# Patient Record
Sex: Female | Born: 1970 | Race: White | Hispanic: No | Smoking: Current every day smoker
Health system: Southern US, Community
[De-identification: ages and names within clinical notes are randomized; demographics above are authoritative.]

## PROBLEM LIST (undated history)

## (undated) DIAGNOSIS — K219 Gastro-esophageal reflux disease without esophagitis: Secondary | ICD-10-CM

## (undated) DIAGNOSIS — I10 Essential (primary) hypertension: Secondary | ICD-10-CM

## (undated) DIAGNOSIS — Z9114 Patient's other noncompliance with medication regimen: Secondary | ICD-10-CM

## (undated) DIAGNOSIS — K259 Gastric ulcer, unspecified as acute or chronic, without hemorrhage or perforation: Secondary | ICD-10-CM

## (undated) DIAGNOSIS — E876 Hypokalemia: Secondary | ICD-10-CM

## (undated) DIAGNOSIS — K056 Periodontal disease, unspecified: Secondary | ICD-10-CM

## (undated) DIAGNOSIS — R51 Headache: Secondary | ICD-10-CM

## (undated) DIAGNOSIS — F419 Anxiety disorder, unspecified: Secondary | ICD-10-CM

## (undated) DIAGNOSIS — M797 Fibromyalgia: Secondary | ICD-10-CM

## (undated) DIAGNOSIS — F329 Major depressive disorder, single episode, unspecified: Secondary | ICD-10-CM

## (undated) DIAGNOSIS — F32A Depression, unspecified: Secondary | ICD-10-CM

## (undated) DIAGNOSIS — D72828 Other elevated white blood cell count: Secondary | ICD-10-CM

## (undated) DIAGNOSIS — I639 Cerebral infarction, unspecified: Secondary | ICD-10-CM

## (undated) DIAGNOSIS — R519 Headache, unspecified: Secondary | ICD-10-CM

## (undated) DIAGNOSIS — D729 Disorder of white blood cells, unspecified: Secondary | ICD-10-CM

## (undated) DIAGNOSIS — R635 Abnormal weight gain: Secondary | ICD-10-CM

## (undated) DIAGNOSIS — G8929 Other chronic pain: Secondary | ICD-10-CM

## (undated) DIAGNOSIS — L680 Hirsutism: Secondary | ICD-10-CM

## (undated) DIAGNOSIS — M549 Dorsalgia, unspecified: Secondary | ICD-10-CM

## (undated) DIAGNOSIS — E559 Vitamin D deficiency, unspecified: Secondary | ICD-10-CM

## (undated) HISTORY — DX: Hypokalemia: E87.6

## (undated) HISTORY — DX: Dorsalgia, unspecified: M54.9

## (undated) HISTORY — DX: Other elevated white blood cell count: D72.828

## (undated) HISTORY — DX: Other chronic pain: G89.29

## (undated) HISTORY — DX: Vitamin D deficiency, unspecified: E55.9

## (undated) HISTORY — DX: Hirsutism: L68.0

## (undated) HISTORY — DX: Anxiety disorder, unspecified: F41.9

## (undated) HISTORY — DX: Fibromyalgia: M79.7

## (undated) HISTORY — DX: Periodontal disease, unspecified: K05.6

## (undated) HISTORY — DX: Patient's other noncompliance with medication regimen: Z91.14

## (undated) HISTORY — DX: Disorder of white blood cells, unspecified: D72.9

## (undated) HISTORY — DX: Abnormal weight gain: R63.5

## (undated) HISTORY — PX: OTHER SURGICAL HISTORY: SHX169

---

## 2010-02-09 ENCOUNTER — Ambulatory Visit: Payer: Self-pay | Admitting: Internal Medicine

## 2010-02-21 ENCOUNTER — Ambulatory Visit: Payer: Self-pay

## 2010-03-05 ENCOUNTER — Emergency Department: Payer: Self-pay | Admitting: Emergency Medicine

## 2010-04-27 ENCOUNTER — Inpatient Hospital Stay: Payer: Self-pay | Admitting: Internal Medicine

## 2010-05-25 ENCOUNTER — Emergency Department: Payer: Self-pay | Admitting: Emergency Medicine

## 2010-06-21 ENCOUNTER — Emergency Department: Payer: Self-pay | Admitting: Unknown Physician Specialty

## 2010-07-23 ENCOUNTER — Emergency Department: Payer: Self-pay | Admitting: Emergency Medicine

## 2010-10-09 ENCOUNTER — Emergency Department: Payer: Self-pay | Admitting: Emergency Medicine

## 2010-10-10 ENCOUNTER — Emergency Department: Payer: Self-pay | Admitting: Emergency Medicine

## 2010-10-29 ENCOUNTER — Inpatient Hospital Stay: Payer: Self-pay | Admitting: Specialist

## 2010-11-02 LAB — PATHOLOGY REPORT

## 2010-11-26 ENCOUNTER — Emergency Department: Payer: Self-pay | Admitting: Emergency Medicine

## 2010-11-27 ENCOUNTER — Emergency Department: Payer: Self-pay | Admitting: Emergency Medicine

## 2011-01-08 ENCOUNTER — Emergency Department: Payer: Self-pay | Admitting: Emergency Medicine

## 2011-03-29 ENCOUNTER — Emergency Department: Payer: Self-pay | Admitting: Emergency Medicine

## 2011-03-29 ENCOUNTER — Emergency Department: Payer: Self-pay | Admitting: Internal Medicine

## 2011-04-19 ENCOUNTER — Emergency Department: Payer: Self-pay | Admitting: Emergency Medicine

## 2011-05-02 ENCOUNTER — Emergency Department: Payer: Self-pay | Admitting: Unknown Physician Specialty

## 2011-06-12 ENCOUNTER — Emergency Department: Payer: Self-pay | Admitting: Emergency Medicine

## 2011-06-13 DIAGNOSIS — F1120 Opioid dependence, uncomplicated: Secondary | ICD-10-CM | POA: Insufficient documentation

## 2011-06-13 DIAGNOSIS — F1920 Other psychoactive substance dependence, uncomplicated: Secondary | ICD-10-CM | POA: Insufficient documentation

## 2011-10-24 ENCOUNTER — Emergency Department: Payer: Self-pay | Admitting: Emergency Medicine

## 2011-10-24 LAB — URINALYSIS, COMPLETE
Bilirubin,UR: NEGATIVE
Blood: NEGATIVE
Glucose,UR: NEGATIVE mg/dL (ref 0–75)
Nitrite: NEGATIVE
RBC,UR: 2 /HPF (ref 0–5)
Specific Gravity: 1.025 (ref 1.003–1.030)
Squamous Epithelial: 6
WBC UR: 1 /HPF (ref 0–5)

## 2011-10-24 LAB — CBC
HCT: 41.6 % (ref 35.0–47.0)
MCH: 29.1 pg (ref 26.0–34.0)
MCHC: 33.4 g/dL (ref 32.0–36.0)
MCV: 87 fL (ref 80–100)
RDW: 14.1 % (ref 11.5–14.5)

## 2011-10-24 LAB — COMPREHENSIVE METABOLIC PANEL
Albumin: 3.8 g/dL (ref 3.4–5.0)
Anion Gap: 11 (ref 7–16)
Calcium, Total: 9 mg/dL (ref 8.5–10.1)
Chloride: 105 mmol/L (ref 98–107)
Co2: 26 mmol/L (ref 21–32)
EGFR (African American): >60
EGFR (Non-African Amer.): >60
Glucose: 104 mg/dL — ABNORMAL HIGH (ref 65–99)
Potassium: 3.7 mmol/L (ref 3.5–5.1)
SGOT(AST): 12 U/L — ABNORMAL LOW (ref 15–37)
SGPT (ALT): 20 U/L
Total Protein: 7.5 g/dL (ref 6.4–8.2)

## 2011-10-24 LAB — PREGNANCY, URINE: Pregnancy Test, Urine: NEGATIVE m[IU]/mL

## 2011-10-24 LAB — LIPASE, BLOOD: Lipase: 151 U/L (ref 73–393)

## 2012-01-11 ENCOUNTER — Emergency Department: Payer: Self-pay | Admitting: Emergency Medicine

## 2012-01-11 LAB — CBC WITH DIFFERENTIAL/PLATELET
Basophil #: 0 10*3/uL (ref 0.0–0.1)
Basophil %: 0.2 %
Eosinophil %: 0 %
Lymphocyte %: 8.1 %
MCHC: 33.8 g/dL (ref 32.0–36.0)
MCV: 86 fL (ref 80–100)
Monocyte %: 4.4 %
Neutrophil #: 13.8 10*3/uL — ABNORMAL HIGH (ref 1.4–6.5)
Neutrophil %: 87.3 %
RBC: 4.74 10*6/uL (ref 3.80–5.20)

## 2012-01-11 LAB — DRUG SCREEN, URINE
MDMA (Ecstasy)Ur Screen: POSITIVE (ref ?–500)
Methadone, Ur Screen: NEGATIVE (ref ?–300)
Phencyclidine (PCP) Ur S: NEGATIVE (ref ?–25)

## 2012-01-11 LAB — URINALYSIS, COMPLETE
Ph: 5 (ref 4.5–8.0)
Protein: NEGATIVE
Squamous Epithelial: 12
WBC UR: 4 /HPF (ref 0–5)

## 2012-01-11 LAB — COMPREHENSIVE METABOLIC PANEL
Albumin: 4.1 g/dL (ref 3.4–5.0)
Chloride: 105 mmol/L (ref 98–107)
Co2: 25 mmol/L (ref 21–32)
Creatinine: 0.99 mg/dL (ref 0.60–1.30)
EGFR (African American): >60
Glucose: 126 mg/dL — ABNORMAL HIGH (ref 65–99)
Osmolality: 281 (ref 275–301)
Potassium: 3.3 mmol/L — ABNORMAL LOW (ref 3.5–5.1)
SGPT (ALT): 20 U/L
Sodium: 141 mmol/L (ref 136–145)

## 2012-01-11 LAB — PREGNANCY, URINE: Pregnancy Test, Urine: NEGATIVE m[IU]/mL

## 2012-09-09 ENCOUNTER — Emergency Department: Payer: Self-pay | Admitting: Emergency Medicine

## 2012-09-09 LAB — COMPREHENSIVE METABOLIC PANEL
Albumin: 4.2 g/dL (ref 3.4–5.0)
Alkaline Phosphatase: 85 U/L (ref 50–136)
BUN: 12 mg/dL (ref 7–18)
Bilirubin,Total: 0.3 mg/dL (ref 0.2–1.0)
EGFR (African American): >60
EGFR (Non-African Amer.): >60
Glucose: 101 mg/dL — ABNORMAL HIGH (ref 65–99)
Potassium: 3.6 mmol/L (ref 3.5–5.1)
Sodium: 139 mmol/L (ref 136–145)

## 2012-09-09 LAB — CBC
HGB: 14.8 g/dL (ref 12.0–16.0)
MCH: 28.6 pg (ref 26.0–34.0)
MCHC: 33.4 g/dL (ref 32.0–36.0)
MCV: 86 fL (ref 80–100)
RBC: 5.18 10*6/uL (ref 3.80–5.20)
RDW: 14.8 % — ABNORMAL HIGH (ref 11.5–14.5)

## 2012-09-09 LAB — URINALYSIS, COMPLETE
Bacteria: NONE SEEN
Leukocyte Esterase: NEGATIVE
Nitrite: NEGATIVE
Ph: 6 (ref 4.5–8.0)
Protein: NEGATIVE
Specific Gravity: 1.014 (ref 1.003–1.030)
WBC UR: 6 /HPF (ref 0–5)

## 2012-09-09 LAB — PREGNANCY, URINE: Pregnancy Test, Urine: NEGATIVE m[IU]/mL

## 2013-04-28 ENCOUNTER — Emergency Department: Payer: Self-pay | Admitting: Emergency Medicine

## 2013-05-01 ENCOUNTER — Emergency Department: Payer: Self-pay | Admitting: Internal Medicine

## 2013-05-01 LAB — CBC
HCT: 40.9 % (ref 35.0–47.0)
HGB: 14.2 g/dL (ref 12.0–16.0)
MCH: 30.3 pg (ref 26.0–34.0)
MCHC: 34.6 g/dL (ref 32.0–36.0)
MCV: 88 fL (ref 80–100)
Platelet: 305 10*3/uL (ref 150–440)
RBC: 4.67 10*6/uL (ref 3.80–5.20)
RDW: 13.6 % (ref 11.5–14.5)
WBC: 12.8 10*3/uL — ABNORMAL HIGH (ref 3.6–11.0)

## 2013-05-01 LAB — TROPONIN I: Troponin-I: <0.02 ng/mL

## 2013-05-01 LAB — CK TOTAL AND CKMB (NOT AT ARMC)
CK, Total: 44 U/L (ref 21–215)
CK-MB: <0.5 ng/mL — ABNORMAL LOW (ref 0.5–3.6)

## 2013-05-01 LAB — BASIC METABOLIC PANEL
BUN: 8 mg/dL (ref 7–18)
Calcium, Total: 9.6 mg/dL (ref 8.5–10.1)
Chloride: 106 mmol/L (ref 98–107)
EGFR (African American): >60
EGFR (Non-African Amer.): >60
Glucose: 102 mg/dL — ABNORMAL HIGH (ref 65–99)
Osmolality: 274 (ref 275–301)
Potassium: 3.5 mmol/L (ref 3.5–5.1)
Sodium: 138 mmol/L (ref 136–145)

## 2013-05-16 DIAGNOSIS — F1990 Other psychoactive substance use, unspecified, uncomplicated: Secondary | ICD-10-CM | POA: Insufficient documentation

## 2013-05-16 DIAGNOSIS — F1011 Alcohol abuse, in remission: Secondary | ICD-10-CM | POA: Insufficient documentation

## 2013-05-16 DIAGNOSIS — F1411 Cocaine abuse, in remission: Secondary | ICD-10-CM | POA: Insufficient documentation

## 2013-05-19 DIAGNOSIS — Z8711 Personal history of peptic ulcer disease: Secondary | ICD-10-CM | POA: Insufficient documentation

## 2013-05-19 DIAGNOSIS — M25551 Pain in right hip: Secondary | ICD-10-CM | POA: Insufficient documentation

## 2014-01-19 ENCOUNTER — Other Ambulatory Visit: Payer: Self-pay | Admitting: Family Medicine

## 2014-01-19 LAB — CBC WITH DIFFERENTIAL/PLATELET
BASOS ABS: 0.1 10*3/uL (ref 0.0–0.1)
BASOS PCT: 0.7 %
Eosinophil #: 0.1 10*3/uL (ref 0.0–0.7)
Eosinophil %: 1.3 %
HCT: 39.5 % (ref 35.0–47.0)
HGB: 13.3 g/dL (ref 12.0–16.0)
Lymphocyte #: 1.9 10*3/uL (ref 1.0–3.6)
Lymphocyte %: 18.1 %
MCH: 29.5 pg (ref 26.0–34.0)
MCHC: 33.6 g/dL (ref 32.0–36.0)
MCV: 88 fL (ref 80–100)
MONO ABS: 0.7 x10 3/mm (ref 0.2–0.9)
Monocyte %: 6.3 %
NEUTROS ABS: 7.8 10*3/uL — AB (ref 1.4–6.5)
Neutrophil %: 73.6 %
PLATELETS: 343 10*3/uL (ref 150–440)
RBC: 4.51 10*6/uL (ref 3.80–5.20)
RDW: 14.2 % (ref 11.5–14.5)
WBC: 10.6 10*3/uL (ref 3.6–11.0)

## 2014-01-19 LAB — COMPREHENSIVE METABOLIC PANEL
ALBUMIN: 3.7 g/dL (ref 3.4–5.0)
AST: 29 U/L (ref 15–37)
Alkaline Phosphatase: 96 U/L
Anion Gap: 4 — ABNORMAL LOW (ref 7–16)
BUN: 9 mg/dL (ref 7–18)
Bilirubin,Total: 0.4 mg/dL (ref 0.2–1.0)
CO2: 35 mmol/L — AB (ref 21–32)
CREATININE: 1.18 mg/dL (ref 0.60–1.30)
Calcium, Total: 9.3 mg/dL (ref 8.5–10.1)
Chloride: 94 mmol/L — ABNORMAL LOW (ref 98–107)
GFR CALC NON AF AMER: 57 — AB
GLUCOSE: 88 mg/dL (ref 65–99)
Osmolality: 264 (ref 275–301)
POTASSIUM: 2.8 mmol/L — AB (ref 3.5–5.1)
SGPT (ALT): 31 U/L (ref 12–78)
SODIUM: 133 mmol/L — AB (ref 136–145)
Total Protein: 8.1 g/dL (ref 6.4–8.2)

## 2014-01-19 LAB — LIPID PANEL
CHOLESTEROL: 268 mg/dL — AB (ref 0–200)
HDL: 33 mg/dL — AB (ref 40–60)
Ldl Cholesterol, Calc: 167 mg/dL — ABNORMAL HIGH (ref 0–100)
TRIGLYCERIDES: 340 mg/dL — AB (ref 0–200)
VLDL Cholesterol, Calc: 68 mg/dL — ABNORMAL HIGH (ref 5–40)

## 2014-01-19 LAB — TSH: Thyroid Stimulating Horm: 3.01 u[IU]/mL

## 2014-01-20 ENCOUNTER — Observation Stay: Payer: Self-pay | Admitting: Specialist

## 2014-01-20 LAB — COMPREHENSIVE METABOLIC PANEL
ANION GAP: 4 — AB (ref 7–16)
AST: 38 U/L — AB (ref 15–37)
Albumin: 3.4 g/dL (ref 3.4–5.0)
Alkaline Phosphatase: 87 U/L
BUN: 8 mg/dL (ref 7–18)
Bilirubin,Total: 0.4 mg/dL (ref 0.2–1.0)
CHLORIDE: 100 mmol/L (ref 98–107)
CO2: 33 mmol/L — AB (ref 21–32)
Calcium, Total: 8.6 mg/dL (ref 8.5–10.1)
Creatinine: 0.82 mg/dL (ref 0.60–1.30)
EGFR (African American): >60
EGFR (Non-African Amer.): >60
GLUCOSE: 100 mg/dL — AB (ref 65–99)
Osmolality: 272 (ref 275–301)
Potassium: 2.9 mmol/L — ABNORMAL LOW (ref 3.5–5.1)
SGPT (ALT): 41 U/L (ref 12–78)
Sodium: 137 mmol/L (ref 136–145)
Total Protein: 7.5 g/dL (ref 6.4–8.2)

## 2014-01-20 LAB — CBC WITH DIFFERENTIAL/PLATELET
BASOS ABS: 0 10*3/uL (ref 0.0–0.1)
BASOS PCT: 0.5 %
EOS ABS: 0.1 10*3/uL (ref 0.0–0.7)
Eosinophil %: 1.2 %
HCT: 39.1 % (ref 35.0–47.0)
HGB: 13 g/dL (ref 12.0–16.0)
Lymphocyte #: 1.6 10*3/uL (ref 1.0–3.6)
Lymphocyte %: 16.3 %
MCH: 29.1 pg (ref 26.0–34.0)
MCHC: 33.2 g/dL (ref 32.0–36.0)
MCV: 88 fL (ref 80–100)
MONOS PCT: 5.8 %
Monocyte #: 0.6 x10 3/mm (ref 0.2–0.9)
NEUTROS PCT: 76.2 %
Neutrophil #: 7.5 10*3/uL — ABNORMAL HIGH (ref 1.4–6.5)
PLATELETS: 338 10*3/uL (ref 150–440)
RBC: 4.47 10*6/uL (ref 3.80–5.20)
RDW: 14.5 % (ref 11.5–14.5)
WBC: 9.9 10*3/uL (ref 3.6–11.0)

## 2014-01-20 LAB — TSH: Thyroid Stimulating Horm: 1.13 u[IU]/mL

## 2014-01-20 LAB — TROPONIN I: Troponin-I: <0.02 ng/mL

## 2014-01-20 LAB — MAGNESIUM: Magnesium: 2.1 mg/dL

## 2014-01-21 DIAGNOSIS — R079 Chest pain, unspecified: Secondary | ICD-10-CM

## 2014-01-21 LAB — BASIC METABOLIC PANEL
ANION GAP: 3 — AB (ref 7–16)
BUN: 9 mg/dL (ref 7–18)
CALCIUM: 8.6 mg/dL (ref 8.5–10.1)
Chloride: 102 mmol/L (ref 98–107)
Co2: 32 mmol/L (ref 21–32)
Creatinine: 0.69 mg/dL (ref 0.60–1.30)
EGFR (African American): >60
EGFR (Non-African Amer.): >60
Glucose: 95 mg/dL (ref 65–99)
OSMOLALITY: 272 (ref 275–301)
Potassium: 3.7 mmol/L (ref 3.5–5.1)
Sodium: 137 mmol/L (ref 136–145)

## 2014-01-21 LAB — HEMOGLOBIN A1C: Hemoglobin A1C: 5.4 % (ref 4.2–6.3)

## 2014-01-21 LAB — TROPONIN I: Troponin-I: <0.02 ng/mL

## 2014-07-09 DIAGNOSIS — Z9114 Patient's other noncompliance with medication regimen: Secondary | ICD-10-CM

## 2014-07-09 DIAGNOSIS — Z91148 Patient's other noncompliance with medication regimen for other reason: Secondary | ICD-10-CM

## 2014-07-09 HISTORY — DX: Patient's other noncompliance with medication regimen: Z91.14

## 2014-07-09 HISTORY — DX: Patient's other noncompliance with medication regimen for other reason: Z91.148

## 2014-07-28 ENCOUNTER — Ambulatory Visit: Payer: Self-pay

## 2014-07-28 LAB — CBC CANCER CENTER
BASOS ABS: 0 x10 3/mm (ref 0.0–0.1)
BASOS PCT: 0.4 %
EOS ABS: 0.1 x10 3/mm (ref 0.0–0.7)
EOS PCT: 1.4 %
HCT: 40.9 % (ref 35.0–47.0)
HGB: 13 g/dL (ref 12.0–16.0)
LYMPHS ABS: 1.6 x10 3/mm (ref 1.0–3.6)
LYMPHS PCT: 19.9 %
MCH: 27.2 pg (ref 26.0–34.0)
MCHC: 31.8 g/dL — AB (ref 32.0–36.0)
MCV: 85 fL (ref 80–100)
Monocyte #: 0.5 x10 3/mm (ref 0.2–0.9)
Monocyte %: 5.7 %
NEUTROS ABS: 5.9 x10 3/mm (ref 1.4–6.5)
NEUTROS PCT: 72.6 %
PLATELETS: 356 x10 3/mm (ref 150–440)
RBC: 4.79 10*6/uL (ref 3.80–5.20)
RDW: 16.1 % — ABNORMAL HIGH (ref 11.5–14.5)
WBC: 8.1 x10 3/mm (ref 3.6–11.0)

## 2014-07-30 ENCOUNTER — Emergency Department: Payer: Self-pay | Admitting: Student

## 2014-07-30 ENCOUNTER — Ambulatory Visit: Payer: Self-pay | Admitting: Family Medicine

## 2014-08-09 ENCOUNTER — Ambulatory Visit: Payer: Self-pay

## 2014-09-06 ENCOUNTER — Emergency Department: Payer: Self-pay | Admitting: Emergency Medicine

## 2014-09-06 LAB — CBC WITH DIFFERENTIAL/PLATELET
BASOS ABS: 0 10*3/uL (ref 0.0–0.1)
Basophil %: 0.3 %
Eosinophil #: 0.1 10*3/uL (ref 0.0–0.7)
Eosinophil %: 0.7 %
HCT: 43.9 % (ref 35.0–47.0)
HGB: 14.1 g/dL (ref 12.0–16.0)
Lymphocyte #: 2.1 10*3/uL (ref 1.0–3.6)
Lymphocyte %: 15.3 %
MCH: 27.5 pg (ref 26.0–34.0)
MCHC: 32.2 g/dL (ref 32.0–36.0)
MCV: 86 fL (ref 80–100)
MONO ABS: 0.9 x10 3/mm (ref 0.2–0.9)
MONOS PCT: 6.4 %
NEUTROS PCT: 77.3 %
Neutrophil #: 10.8 10*3/uL — ABNORMAL HIGH (ref 1.4–6.5)
PLATELETS: 400 10*3/uL (ref 150–440)
RBC: 5.14 10*6/uL (ref 3.80–5.20)
RDW: 15.1 % — ABNORMAL HIGH (ref 11.5–14.5)
WBC: 14 10*3/uL — AB (ref 3.6–11.0)

## 2014-09-06 LAB — LIPASE, BLOOD: Lipase: 240 U/L (ref 73–393)

## 2014-09-06 LAB — URINALYSIS, COMPLETE
Bilirubin,UR: NEGATIVE
Glucose,UR: NEGATIVE mg/dL (ref 0–75)
Ketone: NEGATIVE
Leukocyte Esterase: NEGATIVE
NITRITE: NEGATIVE
PH: 5 (ref 4.5–8.0)
Protein: NEGATIVE
RBC,UR: 5 /HPF (ref 0–5)
Specific Gravity: 1.02 (ref 1.003–1.030)
Squamous Epithelial: 8
WBC UR: 5 /HPF (ref 0–5)

## 2014-09-06 LAB — COMPREHENSIVE METABOLIC PANEL
ALBUMIN: 4.1 g/dL (ref 3.4–5.0)
Alkaline Phosphatase: 86 U/L
Anion Gap: 6 — ABNORMAL LOW (ref 7–16)
BUN: 8 mg/dL (ref 7–18)
Bilirubin,Total: 0.2 mg/dL (ref 0.2–1.0)
CO2: 27 mmol/L (ref 21–32)
CREATININE: 0.97 mg/dL (ref 0.60–1.30)
Calcium, Total: 9.4 mg/dL (ref 8.5–10.1)
Chloride: 105 mmol/L (ref 98–107)
EGFR (African American): >60
EGFR (Non-African Amer.): >60
Glucose: 105 mg/dL — ABNORMAL HIGH (ref 65–99)
OSMOLALITY: 274 (ref 275–301)
Potassium: 3.8 mmol/L (ref 3.5–5.1)
SGOT(AST): <5 U/L — ABNORMAL LOW (ref 15–37)
SGPT (ALT): 21 U/L
SODIUM: 138 mmol/L (ref 136–145)
Total Protein: 8.6 g/dL — ABNORMAL HIGH (ref 6.4–8.2)

## 2014-10-29 ENCOUNTER — Emergency Department: Payer: Self-pay | Admitting: Emergency Medicine

## 2015-01-30 NOTE — H&P (Signed)
PATIENT NAME:  Latasha Lawrence, TONNESEN MR#:  [id number] DATE OF BIRTH:  1970-10-18  DATE OF ADMISSION:  01/20/2014  PRIMARY CARE PHYSICIAN:  Baruch Gouty, MD  REFERRING PHYSICIAN:  Dorothea Glassman, MD  CHIEF COMPLAINT: Chest pain 2 weeks, worsening today.   HISTORY OF PRESENT ILLNESS: A 44 year old Caucasian female with a history of gastric erosion and ulcer, hypertension, depression presented to the ED with chest pain, for 2 weeks, worsening today. The patient said she has chest pain on and off for 2 weeks which has been worsening today. The patient's chest pain is in the substernal area, intermittent, aching and lasted about 3 minutes when she had the chest pain, radiated to the neck and shoulder. She also complains of nausea and sweating but denies any palpitations, orthopnea or nocturnal dyspnea. No weight gain. No leg edema. The patient said that she has lot of stress recently.   PAST MEDICAL HISTORY: Hypertension, fibromyalgia, gastric erosion and ulcer, disc hernia L4-L5 and chronic back pain.   PAST SURGICAL HISTORY: C-section and right hand surgery.   SOCIAL HISTORY: Smokes 1 pack a day since 44 years old. No alcohol drinking or illicit drugs.   FAMILY HISTORY: Father had heart disease.   ALLERGIES: MORPHINE AND PENICILLIN.   HOME MEDICATIONS:  Citalopram 40 mg p.o. daily, gabapentin 800 mg p.o. t.i.d., HCTZ 25 mg p.o. daily, Nucynta 75 mg 3 times a day, Nucynta ER 150 mg p.o. b.i.d.   REVIEW OF SYSTEMS:    CONSTITUTIONAL: The patient denies any fever or chills. No headache or dizziness. No weakness.  EYES: No double vision or blurry vision.  ENT: No postnasal drip, slurred speech or dysphagia.  CARDIOVASCULAR: Positive for chest pain. No palpitations, orthopnea or nocturnal dyspnea. No leg edema.  PULMONARY: No cough, sputum, shortness of breath or hemoptysis.  GASTROINTESTINAL: No abdominal pain, nausea, vomiting or diarrhea. No melena or bloody stool.  GENITOURINARY: No dysuria,  hematuria or incontinence.  SKIN: No rash or jaundice.  ENDOCRINOLOGY: No polyuria, polydipsia, heat or cold intolerance.  HEMATOLOGY: No easy bruising or bleeding.   NEUROLOGY: No syncope, loss of consciousness or seizure.   PHYSICAL EXAMINATION: VITAL SIGNS: Temperature 98.3, blood pressure 114/52, pulse of 76, respirations 18, O2  saturation 96% on room air.  GENERAL: The patient is alert, awake, oriented, in no acute distress.  HEENT: Pupils round, equal and reactive to light and accommodation. Moist oral mucosa. Clear oropharynx.  NECK: Supple. No JVD or carotid bruit. No lymphadenopathy. No thyromegaly.  CARDIOVASCULAR: S1, S2. Regular rate and rhythm. No murmurs or gallop.  PULMONARY: Bilateral air entry. No wheezing or rales. No use of accessory muscle to breathe.  ABDOMEN: Soft. No distention. No tenderness. No organomegaly. Bowel sounds present.  EXTREMITIES: No edema, clubbing or cyanosis. No calf tenderness. Bilateral pedal pulses present.  SKIN: No rash or jaundice.  NEUROLOGIC: A and O x 3. No focal deficit. Power 5/5. Sensory intact.   LABORATORY, DIAGNOSTIC AND RADIOLOGICAL DATA:   1.  Troponin less than 0.02 twice.  2.  Chest x-ray no active disease  3.  CBC in normal range. Glucose 100, BUN 8, creatinine 0.82, sodium 137, potassium 2.9, chloride 100, bicarb 33. TSH 1.13. Magnesium 2.1.  4.  EKG showed normal sinus rhythm at 82 bpm with flat T wave compared to previous EKG.  5.  VLDL 68, LDL 167, cholesterol 268, triglyceride 340, HDL 33.   IMPRESSIONS: 1.  Chest pain. No acute coronary syndrome, need to rule out coronary artery  disease.  2.  Hypokalemia.  3.  Hyperlipidemia.  4.  Tobacco abuse.   PLAN OF TREATMENT: 1.  The patient will be placed for observation. We will continue telemonitor. We will get a stress test in the morning. The patient was treated with Plavix 300 mg in ED. We will start aspirin 81 mg daily and Zocor 40 mg at bedtime. We will hold HCTZ due to  hypokalemia and start lisinopril and Lopressor.  2.  For hypokalemia, we will give potassium p.o. and IV. Follow up BMP, check magnesium level is normal.  3.  Smoking cessation was counseled for 5 minutes. We will give nicotine patch.  4.  I discussed the patient's condition and plan of treatment with the patient and the patient's husband. Patient wants full code.  TIME SPENT: About 48 minutes.    ____________________________ Shaune PollackQing Terril Chestnut, MD qc:cs D: 01/20/2014 19:16:40 ET T: 01/20/2014 19:35:43 ET JOB#: [id number]  cc: Shaune PollackQing Langston [place], MD, <Dictator> Shaune PollackQING Sharolyn Weber MD ELECTRONICALLY SIGNED 01/22/2014 18:03

## 2015-01-30 NOTE — Discharge Summary (Signed)
PATIENT NAME:  Latasha Lawrence, Latasha Lawrence MR#:  [id number] DATE OF BIRTH:  08-Feb-1971  DATE OF ADMISSION:  01/20/2014 DATE OF DISCHARGE:  01/21/2014   For a detailed note, please take a look at the history and physical done on admission by Dr. Imogene Burnhen.   DIAGNOSES AT DISCHARGE: As follows:  1. Chest pain, likely musculoskeletal in nature.  2. Fibromyalgia.  3. Chronic back pain.  4. Hypertension.  5. Neuropathy.  6. Hypokalemia.   DIET: The patient is being discharged on a low-sodium, low-fat diet.   ACTIVITY: As tolerated.   FOLLOWUP: [facility] in the next 1 to 2 weeks.    DISCHARGE MEDICATIONS:  1. Celexa 40 mg daily.  2. Gabapentin 800 mg t.i.d. 3. Hydrochlorothiazide 25 mg daily. 4. Nucynta extended release 150 mg b.i.d.  5. Nucynta 75 mg t.i.d. 6. Aspirin 81 mg daily. 7. Pravachol 20 mg at bedtime.   PERTINENT STUDIES DONE DURING THE HOSPITAL COURSE: Are as follows: A chest x-ray done on admission showing no acute cardiopulmonary disease. A nuclear medicine myocardial scan done on 01/21/2014 showing no significant wall motion abnormality, EF of 75%, left ventricular global function normal. No EKG changes concerning for ischemia.   BRIEF HOSPITAL COURSE: This is a 44 year old female with medical problems as mentioned above, who presented to the hospital with chest pain.   1. Chest pain. The patient does have underlying risk factors for coronary artery disease given her ongoing tobacco abuse and hypertension. She was therefore observed overnight on telemetry, had 3 sets of cardiac markers checked which were negative. She underwent a nuclear medicine myocardial scan which showed no evidence of any acute ST or T wave changes or any acute ischemia or any wall motion abnormalities, with a normal ejection fraction. She is currently chest pain-free and hemodynamically stable. Likely cause of her chest pain was likely musculoskeletal, related to her fibromyalgia. She is  therefore being discharged home.  2. Hypokalemia. The patient had a potassium of 2.8 at admission. This was likely secondary to the use of her diuretics. This was replaced, and her potassium has now normalized.  3. Chronic back pain and fibromyalgia. The patient was on Nucynta. She was maintained on that, and she will resume that upon discharge.  4. Neuropathy. The patient was maintained on Neurontin. She will resume that.  5. Hypertension. The patient was maintained on her hydrochlorothiazide, and she will resume that upon discharge.  6. Hyperlipidemia. The patient had a lipid profile checked in the hospital which showed a total cholesterol over 200 and LDL of 168. She has been started on a statin.   CODE STATUS: The patient is a full code.   TIME SPENT WITH THE DISCHARGE: 40 minutes.   ____________________________ Rolly PancakeVivek Lawrence. Cherlynn KaiserSainani, MD vjs:lb D: 01/21/2014 15:23:40 ET T: 01/21/2014 15:31:00 ET JOB#: [id number]  cc: Rolly PancakeVivek Lawrence. Cherlynn KaiserSainani, MD, <Dictator> Baruch GoutyMelinda Lada, MD Houston SirenVIVEK Lawrence Warren Lindahl MD ELECTRONICALLY SIGNED 02/02/2014 10:40

## 2015-02-16 ENCOUNTER — Encounter: Payer: Self-pay | Admitting: *Deleted

## 2015-02-16 ENCOUNTER — Emergency Department
Admission: EM | Admit: 2015-02-16 | Discharge: 2015-02-16 | Disposition: A | Discharge status: Home / Self Care | Attending: Student | Admitting: Student

## 2015-02-16 ENCOUNTER — Emergency Department

## 2015-02-16 DIAGNOSIS — I10 Essential (primary) hypertension: Secondary | ICD-10-CM | POA: Diagnosis not present

## 2015-02-16 DIAGNOSIS — M79675 Pain in left toe(s): Secondary | ICD-10-CM | POA: Insufficient documentation

## 2015-02-16 DIAGNOSIS — Z880 Allergy status to penicillin: Secondary | ICD-10-CM | POA: Insufficient documentation

## 2015-02-16 HISTORY — DX: Essential (primary) hypertension: I10

## 2015-02-16 LAB — URIC ACID: URIC ACID, SERUM: 4.6 mg/dL (ref 2.3–6.6)

## 2015-02-16 MED ORDER — OXYCODONE-ACETAMINOPHEN 5-325 MG PO TABS
ORAL_TABLET | ORAL | Status: AC
  Filled 2015-02-16: qty 1

## 2015-02-16 MED ORDER — OXYCODONE-ACETAMINOPHEN 5-325 MG PO TABS: 1.0000 | ORAL_TABLET | Freq: Three times a day (TID) | ORAL | Status: DC | PRN

## 2015-02-16 MED ORDER — OXYCODONE-ACETAMINOPHEN 5-325 MG PO TABS
1.0000 | ORAL_TABLET | Freq: Once | ORAL | Status: AC
  Administered 2015-02-16: 1 via ORAL

## 2015-02-16 NOTE — Discharge Instructions (Signed)
Take medication as prescribed. Use crutches and post operative shoe as long as pain continues. Apply ice and elevate.   Follow up with orthopedic or podiatry next week for continued pain.    Return to ER for new or worsening concerns.

## 2015-02-16 NOTE — ED Notes (Signed)
[facility] NP called lab to find out what the  cbc results are they said it was to small of sample but never called us to make us aware so we could redraw blood.

## 2015-02-16 NOTE — ED Notes (Signed)
Pt to ED with gradual onset of left bug toe pain that occurred without injury, pt denies any trauma or insect bite. Pt states unable to wiggle toe, slight redness noted. Pt ambulatory while here. No acute distress noted.

## 2015-02-16 NOTE — ED Provider Notes (Signed)
[facility] Emergency Department Provider Note   Time seen: Approximately 10:46 PM  I have reviewed the triage vital signs and the nursing notes.   HISTORY  Chief Complaint Toe Pain   HPI Latasha BorsRobin J Lawrence is a 44 y.o. female presents to the ER with complaints of left great toe pain. Patient states that she does not remember injuring the toe. However patient does report 4 days of left great toe pain. Patient reports that it hurts even to the slightest touch it hurts to have her sock on the foot. Patient does report that she can fully move toe however with pain present. Patient describes the pain at 6-8 out of 10 as an aching and stiffness pain. Patient denies any other injury or open wound. Patient denies previous pain.  Denies fever, fall or injury.   Past Medical History  Diagnosis Date  . Hypertension   Fibromyalgia Depression GI ulcers  There are no active problems to display for this patient.   No past surgical history on file.  Home medications: Gabapentin "blood pressure medicine" Zantac Allergies Morphine and related; Nsaids; and Penicillins NSAIDS No family history on file.  Social History History  Substance Use Topics  . Smoking status: Not on file  . Smokeless tobacco: Not on file  . Alcohol Use: Not on file    Review of Systems Constitutional: No fever/chills Eyes: No visual changes. ENT: No sore throat. Cardiovascular: Denies chest pain. Respiratory: Denies shortness of breath. Gastrointestinal: No abdominal pain.  No nausea, no vomiting.  No diarrhea.  No constipation. Genitourinary: Negative for dysuria. Musculoskeletal: pain to left toe as above.Negative for back pain. Skin: Negative for rash. Neurological: Negative for headaches, focal weakness or numbness.  10-point ROS otherwise negative.  ____________________________________________   PHYSICAL EXAM:  VITAL SIGNS: ED Triage Vitals  Enc Vitals Group     BP  02/16/15 2121 158/95 mmHg     Pulse Rate 02/16/15 2121 80     Resp 02/16/15 2121 16     Temp 02/16/15 2121 98.6 F (37 C)     Temp Source 02/16/15 2121 Oral     SpO2 02/16/15 2121 98 %     Weight 02/16/15 2121 200 lb (90.719 kg)     Height 02/16/15 2121 5\' 6"  (1.676 m)     Head Cir --      Peak Flow --      Pain Score 02/16/15 2125 7     Pain Loc --      Pain Edu? --      Excl. in GC? --     Constitutional: Alert and oriented. Well appearing and in no acute distress. Eyes: Conjunctivae are normal. PERRL. EOMI. Head: Atraumatic. Nose: No congestion/rhinnorhea. Mouth/Throat: Mucous membranes are moist.  Oropharynx non-erythematous. Neck: No stridor.  No cervical spine tenderness to palpation. Hematological/Lymphatic/Immunilogical: No cervical lymphadenopathy. Cardiovascular: Normal rate, regular rhythm. Grossly normal heart sounds.  Good peripheral circulation. Respiratory: Normal respiratory effort.  No retractions. Lungs CTAB. Gastrointestinal: Soft and nontender. No distention. No abdominal bruits. No CVA tenderness. Musculoskeletal: No lower extremity tenderness nor edema.  No joint effusions. Except:  Pain present to patient's left great toe base and left ball of foot. Minimal swelling noted. No erythema. Skin intact. Moderate tender to palpation with light touch. Sensation intact. Cap Refill less than 2 seconds. Full range of motion however pain present. Neurologic:  Normal speech and language. No gross focal neurologic deficits are appreciated. Speech is normal. No gait instability.  Skin:  Skin is warm, dry and intact. No rash noted. Psychiatric: Mood and affect are normal. Speech and behavior are normal.  ____________________________________________   LABS (all labs ordered are listed, but only abnormal results are displayed)  Uric  Acid:4.6 ________________ SPLINT APPLICATION Date/Time: 11:46 PM Authorized by: Renford DillsLindsey Romesha Scherer Consent: Verbal consent obtained. Risks  and benefits: risks, benefits and alternatives were discussed Consent given by: patient Splint applied by: ed technician Location details: left Splint type: post opshoe  Post-procedure: The splinted body part was neurovascularly unchanged following the procedure. Patient tolerance: Patient tolerated the procedure well with no immediate complications. crutches  ____________________  RADIOLOGY  LEFT GREAT TOE  COMPARISON: None.  FINDINGS: There is no evidence of fracture or dislocation. There is no evidence of arthropathy or other focal bone abnormality. Soft tissues are unremarkable.  IMPRESSION: Negative.   Electronically Signed By: Andreas NewportGeoffrey Lamke M.D. On: 02/16/2015 22:55 ____________________________________________    INITIAL IMPRESSION / ASSESSMENT AND PLAN / ED COURSE  Pertinent labs & imaging results that were available during my care of the patient were reviewed by me and considered in my medical decision making (see chart for details).  Well appearing.No acute distress.  Left great toe pain, nontraumatic per pt. Concern for injury vs gouty arthritic pain. Uric acid within normal levels. No signs or symptoms for infection.  Will treat with prn percocet. Pt states unable to take NSAIDS. Treat with Crutches and post op shoe, elevation, ice, rest . Follow up with PCP. Follow up podiatry/ortho next week for continued pain. Pt and spouse agree to plan.  ____________________________________________   FINAL CLINICAL IMPRESSION(S) / ED DIAGNOSES  Final diagnoses:  Great toe pain, left     Renford DillsLindsey Menachem Urbanek, NP 02/16/15 2349  Gayla DossEryka A Gayle, MD 02/16/15 2352

## 2015-05-26 ENCOUNTER — Other Ambulatory Visit: Payer: Self-pay

## 2015-05-26 ENCOUNTER — Emergency Department

## 2015-05-26 ENCOUNTER — Emergency Department
Admission: EM | Admit: 2015-05-26 | Discharge: 2015-05-26 | Disposition: A | Discharge status: Home / Self Care | Attending: Emergency Medicine | Admitting: Emergency Medicine

## 2015-05-26 ENCOUNTER — Encounter: Payer: Self-pay | Admitting: *Deleted

## 2015-05-26 DIAGNOSIS — J0140 Acute pansinusitis, unspecified: Secondary | ICD-10-CM | POA: Diagnosis not present

## 2015-05-26 DIAGNOSIS — R079 Chest pain, unspecified: Secondary | ICD-10-CM | POA: Diagnosis not present

## 2015-05-26 DIAGNOSIS — I10 Essential (primary) hypertension: Secondary | ICD-10-CM | POA: Diagnosis not present

## 2015-05-26 DIAGNOSIS — R0602 Shortness of breath: Secondary | ICD-10-CM | POA: Diagnosis present

## 2015-05-26 DIAGNOSIS — Z880 Allergy status to penicillin: Secondary | ICD-10-CM | POA: Diagnosis not present

## 2015-05-26 DIAGNOSIS — J069 Acute upper respiratory infection, unspecified: Secondary | ICD-10-CM | POA: Insufficient documentation

## 2015-05-26 LAB — CBC
HCT: 39.6 % (ref 35.0–47.0)
HEMOGLOBIN: 13 g/dL (ref 12.0–16.0)
MCH: 28 pg (ref 26.0–34.0)
MCHC: 32.8 g/dL (ref 32.0–36.0)
MCV: 85.4 fL (ref 80.0–100.0)
PLATELETS: 368 10*3/uL (ref 150–440)
RBC: 4.63 MIL/uL (ref 3.80–5.20)
RDW: 15.8 % — ABNORMAL HIGH (ref 11.5–14.5)
WBC: 9 10*3/uL (ref 3.6–11.0)

## 2015-05-26 LAB — COMPREHENSIVE METABOLIC PANEL
ALBUMIN: 4 g/dL (ref 3.5–5.0)
ALK PHOS: 65 U/L (ref 38–126)
ALT: 17 U/L (ref 14–54)
ANION GAP: 8 (ref 5–15)
AST: 21 U/L (ref 15–41)
BUN: 9 mg/dL (ref 6–20)
CALCIUM: 8.8 mg/dL — AB (ref 8.9–10.3)
CHLORIDE: 99 mmol/L — AB (ref 101–111)
CO2: 26 mmol/L (ref 22–32)
Creatinine, Ser: 0.96 mg/dL (ref 0.44–1.00)
GFR calc Af Amer: >60 mL/min (ref >60)
GFR calc non Af Amer: >60 mL/min (ref >60)
Glucose, Bld: 96 mg/dL (ref 65–99)
POTASSIUM: 3.8 mmol/L (ref 3.5–5.1)
Sodium: 133 mmol/L — ABNORMAL LOW (ref 135–145)
Total Bilirubin: 0.7 mg/dL (ref 0.3–1.2)
Total Protein: 7.4 g/dL (ref 6.5–8.1)

## 2015-05-26 LAB — TROPONIN I: Troponin I: <0.03 ng/mL (ref <0.031)

## 2015-05-26 MED ORDER — FLUTICASONE PROPIONATE 50 MCG/ACT NA SUSP: 1.0000 | Freq: Two times a day (BID) | NASAL | Status: DC | PRN

## 2015-05-26 MED ORDER — CYCLOBENZAPRINE HCL 10 MG PO TABS: 10.0000 mg | ORAL_TABLET | Freq: Three times a day (TID) | ORAL | Status: DC | PRN

## 2015-05-26 NOTE — Discharge Instructions (Signed)

## 2015-05-26 NOTE — ED Provider Notes (Addendum)
[facility] Emergency Department Provider Note  ____________________________________________  Time seen: Approximately 7:30 PM  I have reviewed the triage vital signs and the nursing notes.   HISTORY  Chief Complaint Shortness of Breath    HPI Latasha Lawrence is a 44 y.o. female with a history of hypertension who presents today with 3 days of cramping chest neck and upper back pain associated with a low-grade fever and cough. She denies any sick contacts. Says she also feels a pressure-like pain over the forehead and face. Denies any runny nose. Denies any nausea vomiting or diarrhea. Shortness of breath is associated with a cough. The cough is productive. Does not take any hormone supplements such as birth control.Chest is left-sided and nonradiating. Says also with diffuse body aches.   Past Medical History  Diagnosis Date  . Hypertension     There are no active problems to display for this patient.   No past surgical history on file.  Current Outpatient Rx  Name  Route  Sig  Dispense  Refill  . oxyCODONE-acetaminophen (ROXICET) 5-325 MG per tablet   Oral   Take 1 tablet by mouth every 8 (eight) hours as needed for moderate pain or severe pain (Do not drive or operate heavy machinery while taking as can cause drowsiness.).   9 tablet   0     Allergies Morphine and related; Nsaids; and Penicillins  No family history on file.  Social History Social History  Substance Use Topics  . Smoking status: Current Every Day Smoker  . Smokeless tobacco: None  . Alcohol Use: No    Review of Systems Constitutional: No fever/chills Eyes: No visual changes. ENT: No sore throat. Cardiovascular: As above Respiratory: As above Gastrointestinal: No abdominal pain.  No nausea, no vomiting.  No diarrhea.  No constipation. Genitourinary: Negative for dysuria. Musculoskeletal: As above  Skin: Negative for rash. Neurological: Negative for headaches, focal  weakness or numbness.  10-point ROS otherwise negative.  ____________________________________________   PHYSICAL EXAM:  VITAL SIGNS: ED Triage Vitals  Enc Vitals Group     BP 05/26/15 1810 104/81 mmHg     Pulse Rate 05/26/15 1810 83     Resp 05/26/15 1810 22     Temp 05/26/15 1810 99.7 F (37.6 C)     Temp Source 05/26/15 1810 Oral     SpO2 05/26/15 1810 98 %     Weight 05/26/15 1810 200 lb (90.719 kg)     Height 05/26/15 1810  (1.676 m)     Head Cir --      Peak Flow --      Pain Score 05/26/15 1812 7     Pain Loc --      Pain Edu? --      Excl. in GC? --     Constitutional: Alert and oriented. Well appearing and in no acute distress. Eyes: Conjunctivae are normal. PERRL. EOMI. Head: Atraumatic. Tenderness over the frontal as well as maxillary sinuses. Nose: No congestion/rhinnorhea. However, patient sniffles several times was in the room. She coughs several times as well. Mouth/Throat: Mucous membranes are moist.  Oropharynx non-erythematous. Neck: No stridor.  Tenderness to the left trapezius. Cardiovascular: Normal rate, regular rhythm. Grossly normal heart sounds.  Good peripheral circulation. Tenderness over the left pectoralis muscle. Respiratory: Normal respiratory effort.  No retractions. Lungs CTAB. Gastrointestinal: Soft and nontender. No distention. No abdominal bruits. No CVA tenderness. Musculoskeletal: No lower extremity tenderness nor edema.  No joint effusions. Neurologic:  Normal speech and language. No gross focal neurologic deficits are appreciated. No gait instability. Skin:  Skin is warm, dry and intact. No rash noted. Psychiatric: Mood and affect are normal. Speech and behavior are normal.  ____________________________________________   LABS (all labs ordered are listed, but only abnormal results are displayed)  Labs Reviewed  CBC - Abnormal; Notable for the following:    RDW 15.8 (*)    All other components within normal limits   COMPREHENSIVE METABOLIC PANEL - Abnormal; Notable for the following:    Sodium 133 (*)    Chloride 99 (*)    Calcium 8.8 (*)    All other components within normal limits  TROPONIN I   ____________________________________________  EKG  ED ECG REPORT I, Arelia Longest, the attending physician, personally viewed and interpreted this ECG.   Date: 05/26/2015  EKG Time: 1819  Rate: 78  Rhythm: normal sinus rhythm  Axis: Normal axis  Intervals:none  ST&T Change: No ST elevations or depressions. No abnormal T-wave inversions.  ____________________________________________  RADIOLOGY  Negative chest x-ray. I personally reviewed this image.   ____________________________________________   PROCEDURES    ____________________________________________   INITIAL IMPRESSION / ASSESSMENT AND PLAN / ED COURSE  Pertinent labs & imaging results that were available during my care of the patient were reviewed by me and considered in my medical decision making (see chart for details).  Patient with likely viral URI. Requesting pain meds but with multiple payment allergies. Will take Tylenol home. We'll give prescription for Flexeril. We'll also give prescription for Flonase. We'll discharge to home. ____________________________________________   FINAL CLINICAL IMPRESSION(S) / ED DIAGNOSES  Acute URI with sinusitis. Initial visit.    Myrna Blazer, MD 05/26/15 1953  PE RC negative.  Myrna Blazer, MD 05/26/15 [id number]  Patient denies any IV drug use or injections to the neck in the past.  Myrna Blazer, MD 05/26/15 [id number]

## 2015-05-26 NOTE — ED Notes (Signed)
Pt ambulatory to triage.   Pt has sob.  Pt has pain in chest,neck and upper back.  Sx for 3 days.  Pt states sx worse last night.  No n/v/d.  cig smoker.  Pt also has a cough

## 2015-05-26 NOTE — ED Notes (Signed)
C/o cough, lungs clear

## 2015-07-21 ENCOUNTER — Emergency Department
Admission: EM | Admit: 2015-07-21 | Discharge: 2015-07-21 | Disposition: A | Discharge status: Home / Self Care | Attending: Emergency Medicine | Admitting: Emergency Medicine

## 2015-07-21 DIAGNOSIS — M25579 Pain in unspecified ankle and joints of unspecified foot: Secondary | ICD-10-CM | POA: Diagnosis present

## 2015-07-21 DIAGNOSIS — Z880 Allergy status to penicillin: Secondary | ICD-10-CM | POA: Insufficient documentation

## 2015-07-21 DIAGNOSIS — M791 Myalgia, unspecified site: Secondary | ICD-10-CM

## 2015-07-21 DIAGNOSIS — I10 Essential (primary) hypertension: Secondary | ICD-10-CM | POA: Diagnosis not present

## 2015-07-21 DIAGNOSIS — Z720 Tobacco use: Secondary | ICD-10-CM | POA: Insufficient documentation

## 2015-07-21 MED ORDER — OXYCODONE-ACETAMINOPHEN 5-325 MG PO TABS: 1.0000 | ORAL_TABLET | Freq: Four times a day (QID) | ORAL | Status: DC | PRN

## 2015-07-21 MED ORDER — HYDROMORPHONE HCL 1 MG/ML IJ SOLN
1.0000 mg | Freq: Once | INTRAMUSCULAR | Status: AC
  Administered 2015-07-21: 1 mg via INTRAMUSCULAR
  Filled 2015-07-21: qty 1

## 2015-07-21 NOTE — ED Provider Notes (Signed)
[facility] Emergency Department Provider Note  ____________________________________________  Time seen: 7:05 AM  I have reviewed the triage vital signs and the nursing notes.   HISTORY  Chief Complaint Joint Pain    HPI Latasha Lawrence is a 44 y.o. female who complains of diffuse body aches for the past 24 hours. This feels like her usual fibromyalgia pressure much more severe. She feels achy everywhere in her muscles and joints, hurts to move, hurts with the pressure sensation from lying on the bed. Denies any recent trauma. No recent injuries or illnesses. No chest pain shortness of breath fever chills cough. She had some nausea and one episode of vomiting today but no diarrhea. No specific belly pain. No urinary symptoms.     Past Medical History  Diagnosis Date  . Hypertension    fibromyalgia   There are no active problems to display for this patient.    No past surgical history on file. None  Current Outpatient Rx  Name  Route  Sig  Dispense  Refill  . cyclobenzaprine (FLEXERIL) 10 MG tablet   Oral   Take 1 tablet (10 mg total) by mouth 3 (three) times daily as needed for muscle spasms.   15 tablet   1   . fluticasone (FLONASE) 50 MCG/ACT nasal spray   Each Nare   Place 1 spray into both nostrils 2 (two) times daily as needed for allergies or rhinitis.   16 g   0   . oxyCODONE-acetaminophen (ROXICET) 5-325 MG tablet   Oral   Take 1 tablet by mouth every 6 (six) hours as needed for severe pain.   8 tablet   0      Allergies Morphine and related; Nsaids; and Penicillins Reaction to morphine and penicillins as well as nausea, NSAIDs is warning to avoid due to hypertension  No family history on file.  Social History Social History  Substance Use Topics  . Smoking status: Current Every Day Smoker  . Smokeless tobacco: Not on file  . Alcohol Use: No    Review of Systems  Constitutional:   No fever or chills. No weight  changes Eyes:   No blurry vision or double vision.  ENT:   No sore throat. Cardiovascular:   No chest pain. Respiratory:   No dyspnea or cough. Gastrointestinal:   Negative for abdominal pain, vomiting and diarrhea.  No BRBPR or melena. Genitourinary:   Negative for dysuria, urinary retention, bloody urine, or difficulty urinating. Musculoskeletal:   Diffuse musculoskeletal pain. Skin:   Negative for rash. Neurological:   Negative for headaches, focal weakness or numbness. Psychiatric:  No anxiety or depression.   Endocrine:  No hot/cold intolerance, changes in energy, or sleep difficulty.  10-point ROS otherwise negative.  ____________________________________________   PHYSICAL EXAM:  VITAL SIGNS: ED Triage Vitals  Enc Vitals Group     BP 07/21/15 0624 161/109 mmHg     Pulse Rate 07/21/15 0624 85     Resp 07/21/15 0624 20     Temp 07/21/15 0624 98 F (36.7 C)     Temp Source 07/21/15 0624 Oral     SpO2 07/21/15 0624 100 %     Weight 07/21/15 0624 200 lb (90.719 kg)     Height 07/21/15 0624  (1.676 m)     Head Cir --      Peak Flow --      Pain Score --      Pain Loc --  Pain Edu? --      Excl. in GC? --      Constitutional:   Alert and oriented. Well appearing and in no distress. Eyes:   No scleral icterus. No conjunctival pallor. PERRL. EOMI ENT   Head:   Normocephalic and atraumatic.   Nose:   No congestion/rhinnorhea. No septal hematoma   Mouth/Throat:   MMM, no pharyngeal erythema. No peritonsillar mass. No uvula shift.   Neck:   No stridor. No SubQ emphysema. No meningismus. Hematological/Lymphatic/Immunilogical:   No cervical lymphadenopathy. Cardiovascular:   RRR. Normal and symmetric distal pulses are present in all extremities. No murmurs, rubs, or gallops. Respiratory:   Normal respiratory effort without tachypnea nor retractions. Breath sounds are clear and equal bilaterally. No wheezes/rales/rhonchi. Gastrointestinal:   Soft and  nontender. No distention. There is no CVA tenderness.  No rebound, rigidity, or guarding. Genitourinary:   deferred Musculoskeletal:   Diffuse tenderness to palpation throughout the whole body. This is most pronounced in the large muscle groups of the back and thighs and present even with very light palpation. No crepitus or focal swelling/inflammation or bony point tenderness. Neurologic:   Normal speech and language.  CN 2-10 normal. Motor grossly intact. No pronator drift.  Normal gait. No gross focal neurologic deficits are appreciated.  Skin:    Skin is warm, dry and intact. No rash noted.  No petechiae, purpura, or bullae. Psychiatric:   Mood and affect are normal. Speech and behavior are normal. Patient exhibits appropriate insight and judgment.  ____________________________________________    LABS (pertinent positives/negatives) (all labs ordered are listed, but only abnormal results are displayed) Labs Reviewed - No data to display ____________________________________________   EKG    ____________________________________________    RADIOLOGY    ____________________________________________   PROCEDURES   ____________________________________________   INITIAL IMPRESSION / ASSESSMENT AND PLAN / ED COURSE  Pertinent labs & imaging results that were available during my care of the patient were reviewed by me and considered in my medical decision making (see chart for details).  Patient presents with nonspecific nonfocal complaints of musculoskeletal pain. Exam is also nonfocal and generally reassuring. This appears to be an exacerbation of her fibromyalgia related to change in weather or another unknown trigger. Patient will be given a short course of pain medicine on top of the gabapentin that she takes every day and encouraged to follow up with her primary care doctor for continued monitoring of her symptoms. She reports she still has all of her medications and is  taking them as prescribed.     ____________________________________________   FINAL CLINICAL IMPRESSION(S) / ED DIAGNOSES  Final diagnoses:  Myalgia      Sharman CheekPhillip Jakory Matsuo, MD 07/21/15 [phone]

## 2015-07-21 NOTE — ED Notes (Signed)
Pt reports having all over joint pain. Pt states she has fibromyalgia and states she occasionally has a flare up of pain. Pt reports feeling achy everywhere. Nausea and vomiting X1.

## 2015-07-21 NOTE — Discharge Instructions (Signed)
You were prescribed a medication that is potentially sedating. Do not drink alcohol, drive or participate in any other potentially dangerous activities while taking this medication as it may make you sleepy. Do not take this medication with any other sedating medications, either prescription or over-the-counter. If you were prescribed Percocet or Vicodin, do not take these with acetaminophen (Tylenol) as it is already contained within these medications.   Opioid pain medications (or "narcotics") can be habit forming.  Use it as little as possible to achieve adequate pain control.  Do not use or use it with extreme caution if you have a history of opiate abuse or dependence.  If you are on a pain contract with your primary care doctor or a pain specialist, be sure to let them know you were prescribed this medication today from the [facility] Emergency Department.  This medication is intended for your use only - do not give any to anyone else and keep it in a secure place where nobody else, especially children and pets, have access to it.  It will also cause or worsen constipation, so you may want to consider taking an over-the-counter stool softener while you are taking this medication.   Myofascial Pain Syndrome and Fibromyalgia Myofascial pain syndrome and fibromyalgia are both pain disorders. This pain may be felt mainly in your muscles.   Myofascial pain syndrome:  Always has trigger points or tender points in the muscle that will cause pain when pressed. The pain may come and go.  Usually affects your neck, upper back, and shoulder areas. The pain often radiates into your arms and hands.  Fibromyalgia:  Has muscle pains and tenderness that come and go.  Is often associated with fatigue and sleep disturbances.  Has trigger points.  Tends to be long-lasting (chronic), but is not life-threatening. Fibromyalgia and myofascial pain are not the same. However, they often occur together. If  you have both conditions, each can make the other worse. Both are common and can cause enough pain and fatigue to make day-to-day activities difficult.  CAUSES  The exact causes of fibromyalgia and myofascial pain are not known. People with certain gene types may be more likely to develop fibromyalgia. Some factors can be triggers for both conditions, such as:   Spine disorders.  Arthritis.  Severe injury (trauma) and other physical stressors.  Being under a lot of stress.  A medical illness. SIGNS AND SYMPTOMS  Fibromyalgia The main symptom of fibromyalgia is widespread pain and tenderness in your muscles. This can vary over time. Pain is sometimes described as stabbing, shooting, or burning. You may have tingling or numbness, too. You may also have sleep problems and fatigue. You may wake up feeling tired and groggy (fibro fog). Other symptoms may include:   Bowel and bladder problems.  Headaches.  Visual problems.  Problems with odors and noises.  Depression or mood changes.  Painful menstrual periods (dysmenorrhea).  Dry skin or eyes. Myofascial pain syndrome Symptoms of myofascial pain syndrome include:   Tight, ropy bands of muscle.   Uncomfortable sensations in muscular areas, such as:  Aching.  Cramping.  Burning.  Numbness.  Tingling.   Muscle weakness.  Trouble moving certain muscles freely (range of motion). DIAGNOSIS  There are no specific tests to diagnose fibromyalgia or myofascial pain syndrome. Both can be hard to diagnose because their symptoms are common in many other conditions. Your health care provider may suspect one or both of these conditions based on your symptoms  and medical history. Your health care provider will also do a physical exam.  The key to diagnosing fibromyalgia is having pain, fatigue, and other symptoms for more than three months that cannot be explained by another condition.  The key to diagnosing myofascial pain syndrome  is finding trigger points in muscles that are tender and cause pain elsewhere in your body (referred pain). TREATMENT  Treating fibromyalgia and myofascial pain often requires a team of health care providers. This usually starts with your primary provider and a physical therapist. You may also find it helpful to work with alternative health care providers, such as massage therapists or acupuncturists. Treatment for fibromyalgia may include medicines. This may include nonsteroidal anti-inflammatory drugs (NSAIDs), along with other medicines.  Treatment for myofascial pain may also include:  NSAIDs.  Cooling and stretching of muscles.  Trigger point injections.  Sound wave (ultrasound) treatments to stimulate muscles. HOME CARE INSTRUCTIONS   Take medicines only as directed by your health care provider.  Exercise as directed by your health care provider or physical therapist.  Try to avoid stressful situations.  Practice relaxation techniques to control your stress. You may want to try:  Biofeedback.  Visual imagery.  Hypnosis.  Muscle relaxation.  Yoga.  Meditation.  Talk to your health care provider about alternative treatments, such as acupuncture or massage treatment.  Maintain a healthy lifestyle. This includes eating a healthy diet and getting enough sleep.  Consider joining a support group.  Do not do activities that stress or strain your muscles. That includes repetitive motions and heavy lifting. SEEK MEDICAL CARE IF:   You have new symptoms.  Your symptoms get worse.  You have side effects from your medicines.  You have trouble sleeping.  Your condition is causing depression or anxiety. FOR MORE INFORMATION   National Fibromyalgia Association: http://www.fmaware.orgwww.fmaware.org  Arthritis Foundation: http://www.arthritis.orgwww.arthritis.org  American Chronic Pain Association:  michaeledo.comhttp://www.theacpa.org/condition/myofascial-painwww.CandyDash.co.zatheacpa.org/condition/myofascial-pain   This information is not intended to replace advice given to you by your health care provider. Make sure you discuss any questions you have with your health care provider.   Document Released: 09/25/2005 Document Revised: 10/16/2014 Document Reviewed: 07/01/2014 Elsevier Interactive Patient Education [employer]2016 Elsevier Inc.

## 2015-07-31 ENCOUNTER — Encounter: Payer: Self-pay | Admitting: *Deleted

## 2015-07-31 ENCOUNTER — Emergency Department
Admission: EM | Admit: 2015-07-31 | Discharge: 2015-07-31 | Disposition: A | Discharge status: Home / Self Care | Payer: Self-pay | Attending: Emergency Medicine | Admitting: Emergency Medicine

## 2015-07-31 ENCOUNTER — Emergency Department

## 2015-07-31 DIAGNOSIS — Z880 Allergy status to penicillin: Secondary | ICD-10-CM | POA: Insufficient documentation

## 2015-07-31 DIAGNOSIS — Z3202 Encounter for pregnancy test, result negative: Secondary | ICD-10-CM | POA: Insufficient documentation

## 2015-07-31 DIAGNOSIS — Z79899 Other long term (current) drug therapy: Secondary | ICD-10-CM | POA: Insufficient documentation

## 2015-07-31 DIAGNOSIS — B9689 Other specified bacterial agents as the cause of diseases classified elsewhere: Secondary | ICD-10-CM

## 2015-07-31 DIAGNOSIS — Z720 Tobacco use: Secondary | ICD-10-CM | POA: Insufficient documentation

## 2015-07-31 DIAGNOSIS — I10 Essential (primary) hypertension: Secondary | ICD-10-CM | POA: Insufficient documentation

## 2015-07-31 DIAGNOSIS — R102 Pelvic and perineal pain: Secondary | ICD-10-CM

## 2015-07-31 DIAGNOSIS — N760 Acute vaginitis: Secondary | ICD-10-CM | POA: Insufficient documentation

## 2015-07-31 HISTORY — DX: Major depressive disorder, single episode, unspecified: F32.9

## 2015-07-31 HISTORY — DX: Gastric ulcer, unspecified as acute or chronic, without hemorrhage or perforation: K25.9

## 2015-07-31 HISTORY — DX: Depression, unspecified: F32.A

## 2015-07-31 LAB — BASIC METABOLIC PANEL
Anion gap: 6 (ref 5–15)
BUN: 9 mg/dL (ref 6–20)
CO2: 25 mmol/L (ref 22–32)
Calcium: 9.2 mg/dL (ref 8.9–10.3)
Chloride: 110 mmol/L (ref 101–111)
Creatinine, Ser: 0.77 mg/dL (ref 0.44–1.00)
GFR calc Af Amer: >60 mL/min (ref >60)
GLUCOSE: 108 mg/dL — AB (ref 65–99)
Potassium: 3.4 mmol/L — ABNORMAL LOW (ref 3.5–5.1)
Sodium: 141 mmol/L (ref 135–145)

## 2015-07-31 LAB — URINALYSIS COMPLETE WITH MICROSCOPIC (ARMC ONLY)
Bacteria, UA: NONE SEEN
Bilirubin Urine: NEGATIVE
Glucose, UA: NEGATIVE mg/dL
Ketones, ur: NEGATIVE mg/dL
Leukocytes, UA: NEGATIVE
Nitrite: NEGATIVE
PROTEIN: NEGATIVE mg/dL
Specific Gravity, Urine: 1.025 (ref 1.005–1.030)
pH: 5 (ref 5.0–8.0)

## 2015-07-31 LAB — WET PREP, GENITAL
Trich, Wet Prep: NEGATIVE — AB
Yeast Wet Prep HPF POC: NEGATIVE — AB

## 2015-07-31 LAB — CBC
HCT: 41.4 % (ref 35.0–47.0)
Hemoglobin: 14 g/dL (ref 12.0–16.0)
MCH: 29.2 pg (ref 26.0–34.0)
MCHC: 33.7 g/dL (ref 32.0–36.0)
MCV: 86.8 fL (ref 80.0–100.0)
PLATELETS: 373 10*3/uL (ref 150–440)
RBC: 4.77 MIL/uL (ref 3.80–5.20)
RDW: 14.6 % — ABNORMAL HIGH (ref 11.5–14.5)
WBC: 13.5 10*3/uL — ABNORMAL HIGH (ref 3.6–11.0)

## 2015-07-31 LAB — HCG, QUANTITATIVE, PREGNANCY: HCG, BETA CHAIN, QUANT, S: 2 m[IU]/mL (ref <5)

## 2015-07-31 LAB — CHLAMYDIA/NGC RT PCR (ARMC ONLY)
Chlamydia Tr: NOT DETECTED
N GONORRHOEAE: NOT DETECTED

## 2015-07-31 MED ORDER — METRONIDAZOLE 500 MG PO TABS: 500.0000 mg | ORAL_TABLET | Freq: Two times a day (BID) | ORAL | Status: AC

## 2015-07-31 MED ORDER — IOHEXOL 240 MG/ML SOLN
25.0000 mL | Freq: Once | INTRAMUSCULAR | Status: AC | PRN
  Administered 2015-07-31: 25 mL via ORAL
  Filled 2015-07-31: qty 25

## 2015-07-31 MED ORDER — HYDROCODONE-ACETAMINOPHEN 5-325 MG PO TABS: 1.0000 | ORAL_TABLET | Freq: Four times a day (QID) | ORAL | Status: DC | PRN

## 2015-07-31 MED ORDER — IOHEXOL 300 MG/ML  SOLN
100.0000 mL | Freq: Once | INTRAMUSCULAR | Status: AC | PRN
  Administered 2015-07-31: 100 mL via INTRAVENOUS
  Filled 2015-07-31: qty 100

## 2015-07-31 MED ORDER — METRONIDAZOLE 250 MG PO TABS
500.0000 mg | ORAL_TABLET | Freq: Once | ORAL | Status: AC
  Administered 2015-07-31: 500 mg via ORAL
  Filled 2015-07-31: qty 2

## 2015-07-31 MED ORDER — ONDANSETRON 4 MG PO TBDP
4.0000 mg | ORAL_TABLET | Freq: Once | ORAL | Status: AC
  Administered 2015-07-31: 4 mg via ORAL
  Filled 2015-07-31: qty 1

## 2015-07-31 MED ORDER — HYDROCODONE-ACETAMINOPHEN 5-325 MG PO TABS
1.0000 | ORAL_TABLET | ORAL | Status: AC
  Administered 2015-07-31: 1 via ORAL
  Filled 2015-07-31: qty 1

## 2015-07-31 MED ORDER — OXYCODONE-ACETAMINOPHEN 5-325 MG PO TABS
1.0000 | ORAL_TABLET | Freq: Once | ORAL | Status: AC
  Administered 2015-07-31: 1 via ORAL
  Filled 2015-07-31: qty 1

## 2015-07-31 MED ORDER — ONDANSETRON HCL 4 MG PO TABS: 4.0000 mg | ORAL_TABLET | Freq: Three times a day (TID) | ORAL | Status: AC | PRN

## 2015-07-31 NOTE — ED Provider Notes (Signed)
CSN: [id number]     Arrival date & time 07/31/15  1813 History   First MD Initiated Contact with Patient 07/31/15 1846     Chief Complaint  Patient presents with  . Vaginal Bleeding     (Consider location/radiation/quality/duration/timing/severity/associated sxs/prior Treatment) HPI  44 year old female presents to the emergency department for evaluation of abnormal vaginal bleeding and lower pelvic pain. Patient finished her period 2 weeks ago. 10 days ago she noticed vaginal bleeding resembling her home on menses. Over the last 5 days this has tapered down, bleeding has been minimal she describes it as spotting. She states it has been off and on over the last 5 days. Over the last few days she has had cramping with lower back pain increased urinary frequency with burning with urination. She describes mild vaginal discharge with 7 out of 10 pelvic pain. Tylenol has helped with her pain, pain has been moderate. She denies any chest pain shortness of breath, diarrhea, vomiting.  Past Medical History  Diagnosis Date  . Hypertension   . Depression   . Stomach ulcer    History reviewed. No pertinent past surgical history. History reviewed. No pertinent family history. Social History  Substance Use Topics  . Smoking status: Current Every Day Smoker -- 0.50 packs/day  . Smokeless tobacco: None  . Alcohol Use: No   OB History    No data available     Review of Systems  Constitutional: Negative for fever, chills, activity change and fatigue.  HENT: Negative for congestion, sinus pressure and sore throat.   Eyes: Negative for visual disturbance.  Respiratory: Negative for cough, chest tightness and shortness of breath.   Cardiovascular: Negative for chest pain and leg swelling.  Gastrointestinal: Positive for abdominal pain. Negative for nausea, vomiting and diarrhea.  Genitourinary: Positive for dysuria, urgency, hematuria, vaginal bleeding, vaginal discharge and pelvic pain.   Musculoskeletal: Negative for arthralgias and gait problem.  Skin: Negative for rash.  Neurological: Negative for weakness, numbness and headaches.  Hematological: Negative for adenopathy.  Psychiatric/Behavioral: Negative for behavioral problems, confusion and agitation.      Allergies  Morphine and related; Nsaids; and Penicillins  Home Medications   Prior to Admission medications   Medication Sig Start Date End Date Taking? Authorizing Provider  cyclobenzaprine (FLEXERIL) 10 MG tablet Take 1 tablet (10 mg total) by mouth 3 (three) times daily as needed for muscle spasms. 05/26/15   Myrna Blazer, MD  fluticasone [facility]) 50 MCG/ACT nasal spray Place 1 spray into both nostrils 2 (two) times daily as needed for allergies or rhinitis. 05/26/15 05/25/16  Myrna Blazer, MD  HYDROcodone-acetaminophen (NORCO) 5-325 MG tablet Take 1 tablet by mouth every 6 (six) hours as needed for moderate pain. 07/31/15   Evon Slack, PA-C  metroNIDAZOLE (FLAGYL) 500 MG tablet Take 1 tablet (500 mg total) by mouth 2 (two) times daily. X 7 days 07/31/15 08/14/15  Evon Slack, PA-C  oxyCODONE-acetaminophen (ROXICET) 5-325 MG tablet Take 1 tablet by mouth every 6 (six) hours as needed for severe pain. 07/21/15   Sharman Cheek, MD   BP 168/90 mmHg  Pulse 66  Temp(Src) 98.6 F (37 C) (Oral)  Resp 20  Ht  (1.676 m)  Wt 200 lb (90.719 kg)  BMI 32.30 kg/m2  SpO2 99%  LMP 06/30/2015 Physical Exam  Constitutional: She is oriented to person, place, and time. She appears well-developed and well-nourished. No distress.  HENT:  Head: Normocephalic and atraumatic.  Mouth/Throat: Oropharynx is  clear and moist.  Eyes: EOM are normal. Pupils are equal, round, and reactive to light. Right eye exhibits no discharge. Left eye exhibits no discharge.  Neck: Normal range of motion. Neck supple.  Cardiovascular: Normal rate, regular rhythm and intact distal pulses.   Pulmonary/Chest:  Effort normal and breath sounds normal. No respiratory distress. She exhibits no tenderness.  Abdominal: Soft. She exhibits no distension and no mass. There is tenderness (right lower quadrant).  Genitourinary: Uterus is not enlarged and not tender. Cervix exhibits discharge. Cervix exhibits no motion tenderness and no friability. Right adnexum displays no mass, no tenderness and no fullness. Left adnexum displays no mass, no tenderness and no fullness.  Musculoskeletal: Normal range of motion. She exhibits no edema.  Neurological: She is alert and oriented to person, place, and time. She has normal reflexes.  Skin: Skin is warm and dry.  Psychiatric: She has a normal mood and affect. Her behavior is normal. Thought content normal.    ED Course  Procedures (including critical care time) Labs Review Labs Reviewed  WET PREP, GENITAL - Abnormal; Notable for the following:    Yeast Wet Prep HPF POC NEGATIVE (*)    Trich, Wet Prep NEGATIVE (*)    Clue Cells Wet Prep HPF POC FEW (*)    WBC, Wet Prep HPF POC FEW (*)    All other components within normal limits  URINALYSIS COMPLETEWITH MICROSCOPIC ([facility] ONLY) - Abnormal; Notable for the following:    Color, Urine YELLOW (*)    APPearance CLEAR (*)    Hgb urine dipstick 2+ (*)    Squamous Epithelial / LPF 0-5 (*)    All other components within normal limits  CBC - Abnormal; Notable for the following:    WBC 13.5 (*)    RDW 14.6 (*)    All other components within normal limits  BASIC METABOLIC PANEL - Abnormal; Notable for the following:    Potassium 3.4 (*)    Glucose, Bld 108 (*)    All other components within normal limits  CHLAMYDIA/NGC RT PCR ([facility] ONLY)  HCG, QUANTITATIVE, PREGNANCY    Imaging Review Ct Abdomen Pelvis W Contrast  07/31/2015  CLINICAL DATA:  Right lower quadrant pain. Fever. Vaginal bleeding. EXAM: CT ABDOMEN AND PELVIS WITH CONTRAST TECHNIQUE: Multidetector CT imaging of the abdomen and pelvis was  performed using the standard protocol following bolus administration of intravenous contrast. CONTRAST:  OMNIPAQUE IOHEXOL 300 MG/ML  SOLN COMPARISON:  09/06/2014 FINDINGS: Lower chest:  The included lung bases are clear. Liver: No focal lesion. Focal fatty infiltration adjacent with falciform ligament Hepatobiliary: Gallbladder decompressed. No calcified gallstone. No biliary dilatation. Pancreas: Normal. Spleen: Normal. Adrenal glands: No nodule. Kidneys: Symmetric renal enhancement and excretion. No hydronephrosis. No perinephric stranding. Stomach/Bowel: Stomach physiologically distended. There are no dilated or thickened small bowel loops. Small volume of stool throughout the colon without colonic wall thickening. The appendix is normal. Vascular/Lymphatic: No retroperitoneal adenopathy. Abdominal aorta is normal in caliber. Reproductive: Uterus normal in size. Probable nabothian cyst in the cervix. Ovaries symmetric in size. Bladder: Physiologically distended, no wall thickening. Other: No free air, free fluid, or intra-abdominal fluid collection. Tiny fat containing umbilical hernia. Musculoskeletal: There are no acute or suspicious osseous abnormalities. Degenerative disc disease at L5-S1 and L2-L3, stable from prior. IMPRESSION: No acute abnormality in the abdomen/pelvis.  The appendix is normal. Electronically Signed   By: Rubye Oaks M.D.   On: 07/31/2015 22:23   I have personally reviewed and  evaluated these images and lab results as part of my medical decision-making.   EKG Interpretation None      MDM   Final diagnoses:  Pelvic pain in female  Bacterial vaginosis    44 year old female with vaginal bleeding/spotting, vaginal discharge and lower pelvic pain. Wet Prep indicated positive bacterial vaginosis. No sign of urinary tract infection. CT abdomen pelvis ruled out appendicitis and ovarian cyst. Patient will be treated with metronidazole 500 mg twice a day for 7 days. She  will follow-up with GYN physician next week.   Evon Slackhomas C Gaines, PA-C 07/31/15 2247  Jene Everyobert Kinner, MD 08/02/15 [phone]

## 2015-07-31 NOTE — Discharge Instructions (Signed)
Bacterial Vaginosis Bacterial vaginosis is a vaginal infection that occurs when the normal balance of bacteria in the vagina is disrupted. It results from an overgrowth of certain bacteria. This is the most common vaginal infection in women of childbearing age. Treatment is important to prevent complications, especially in pregnant women, as it can cause a premature delivery. CAUSES  Bacterial vaginosis is caused by an increase in harmful bacteria that are normally present in smaller amounts in the vagina. Several different kinds of bacteria can cause bacterial vaginosis. However, the reason that the condition develops is not fully understood. RISK FACTORS Certain activities or behaviors can put you at an increased risk of developing bacterial vaginosis, including:  Having a new sex partner or multiple sex partners.  Douching.  Using an intrauterine device (IUD) for contraception. Women do not get bacterial vaginosis from toilet seats, bedding, swimming pools, or contact with objects around them. SIGNS AND SYMPTOMS  Some women with bacterial vaginosis have no signs or symptoms. Common symptoms include:  Grey vaginal discharge.  A fishlike odor with discharge, especially after sexual intercourse.  Itching or burning of the vagina and vulva.  Burning or pain with urination. DIAGNOSIS  Your health care provider will take a medical history and examine the vagina for signs of bacterial vaginosis. A sample of vaginal fluid may be taken. Your health care provider will look at this sample under a microscope to check for bacteria and abnormal cells. A vaginal pH test may also be done.  TREATMENT  Bacterial vaginosis may be treated with antibiotic medicines. These may be given in the form of a pill or a vaginal cream. A second round of antibiotics may be prescribed if the condition comes back after treatment. Because bacterial vaginosis increases your risk for sexually transmitted diseases, getting  treated can help reduce your risk for chlamydia, gonorrhea, HIV, and herpes. HOME CARE INSTRUCTIONS   Only take over-the-counter or prescription medicines as directed by your health care provider.  If antibiotic medicine was prescribed, take it as directed. Make sure you finish it even if you start to feel better.  Tell all sexual partners that you have a vaginal infection. They should see their health care provider and be treated if they have problems, such as a mild rash or itching.  During treatment, it is important that you follow these instructions:  Avoid sexual activity or use condoms correctly.  Do not douche.  Avoid alcohol as directed by your health care provider.  Avoid breastfeeding as directed by your health care provider. SEEK MEDICAL CARE IF:   Your symptoms are not improving after 3 days of treatment.  You have increased discharge or pain.  You have a fever. MAKE SURE YOU:   Understand these instructions.  Will watch your condition.  Will get help right away if you are not doing well or get worse. FOR MORE INFORMATION  Centers for Disease Control and Prevention, Division of STD Prevention: [employer]raud[employer] American Sexual Health Association (ASHA): www.ashastd.org    This information is not intended to replace advice given to you by your health care provider. Make sure you discuss any questions you have with your health care provider.   Document Released: 09/25/2005 Document Revised: 10/16/2014 Document Reviewed: 05/07/2013 Elsevier Interactive Patient Education 2016 Elsevier Inc.  Abdominal Pain, Adult Many things can cause belly (abdominal) pain. Most times, the belly pain is not dangerous. Many cases of belly pain can be watched and treated at home. HOME CARE   Do not  take medicines that help you go poop (laxatives) unless told to by your doctor.  Only take medicine as told by your doctor.  Eat or drink as told by your doctor. Your doctor will tell  you if you should be on a special diet. GET HELP IF:  You do not know what is causing your belly pain.  You have belly pain while you are sick to your stomach (nauseous) or have runny poop (diarrhea).  You have pain while you pee or poop.  Your belly pain wakes you up at night.  You have belly pain that gets worse or better when you eat.  You have belly pain that gets worse when you eat fatty foods.  You have a fever. GET HELP RIGHT AWAY IF:   The pain does not go away within 2 hours.  You keep throwing up (vomiting).  The pain changes and is only in the right or left part of the belly.  You have bloody or tarry looking poop. MAKE SURE YOU:   Understand these instructions.  Will watch your condition.  Will get help right away if you are not doing well or get worse.   This information is not intended to replace advice given to you by your health care provider. Make sure you discuss any questions you have with your health care provider.   Document Released: 03/13/2008 Document Revised: 10/16/2014 Document Reviewed: 06/04/2013 Elsevier Interactive Patient Education [employer]2016 Elsevier Inc.

## 2015-07-31 NOTE — ED Notes (Signed)
Pt reports vaginal bleeding beginning 10 days ago that began slow and like normal periods. Pt reports bleeding then began to start and stop and blood began look bright red which is abnormal for her. Pt reports nausea and vomiting for the past two days along with a fever. Pt also verbalizes constant pain and burning in vagina. Pain in back and bilateral side also reported. Pt reports feeling lightheaded and dizzy but also verbalizes no appetite for the past three days. Pt reports using 3 small pads per day.

## 2015-07-31 NOTE — ED Notes (Signed)
Called lab, told they were running the urine.

## 2015-07-31 NOTE — ED Notes (Signed)
Discussed discharge instructions, prescriptions, and follow-up care with patient. No questions or concerns at this time. Pt stable at discharge.  

## 2016-02-16 DIAGNOSIS — E559 Vitamin D deficiency, unspecified: Secondary | ICD-10-CM | POA: Insufficient documentation

## 2016-02-16 DIAGNOSIS — E876 Hypokalemia: Secondary | ICD-10-CM | POA: Insufficient documentation

## 2016-02-16 DIAGNOSIS — D72828 Other elevated white blood cell count: Secondary | ICD-10-CM | POA: Insufficient documentation

## 2016-02-16 DIAGNOSIS — M1990 Unspecified osteoarthritis, unspecified site: Secondary | ICD-10-CM | POA: Insufficient documentation

## 2016-02-16 DIAGNOSIS — K056 Periodontal disease, unspecified: Secondary | ICD-10-CM | POA: Insufficient documentation

## 2016-02-16 DIAGNOSIS — L680 Hirsutism: Secondary | ICD-10-CM | POA: Insufficient documentation

## 2016-02-16 DIAGNOSIS — R635 Abnormal weight gain: Secondary | ICD-10-CM | POA: Insufficient documentation

## 2016-02-16 DIAGNOSIS — G8929 Other chronic pain: Secondary | ICD-10-CM | POA: Insufficient documentation

## 2016-02-16 DIAGNOSIS — I10 Essential (primary) hypertension: Secondary | ICD-10-CM | POA: Insufficient documentation

## 2016-02-16 DIAGNOSIS — M797 Fibromyalgia: Secondary | ICD-10-CM | POA: Insufficient documentation

## 2016-02-16 DIAGNOSIS — D729 Disorder of white blood cells, unspecified: Secondary | ICD-10-CM | POA: Insufficient documentation

## 2016-02-16 DIAGNOSIS — M549 Dorsalgia, unspecified: Secondary | ICD-10-CM

## 2016-02-21 ENCOUNTER — Ambulatory Visit: Payer: Self-pay | Admitting: Unknown Physician Specialty

## 2016-03-23 ENCOUNTER — Encounter: Payer: Self-pay | Admitting: Emergency Medicine

## 2016-03-23 ENCOUNTER — Emergency Department
Admission: EM | Admit: 2016-03-23 | Discharge: 2016-03-23 | Disposition: A | Discharge status: Home / Self Care | Attending: Emergency Medicine | Admitting: Emergency Medicine

## 2016-03-23 DIAGNOSIS — F17200 Nicotine dependence, unspecified, uncomplicated: Secondary | ICD-10-CM | POA: Diagnosis not present

## 2016-03-23 DIAGNOSIS — Z79899 Other long term (current) drug therapy: Secondary | ICD-10-CM | POA: Diagnosis not present

## 2016-03-23 DIAGNOSIS — F329 Major depressive disorder, single episode, unspecified: Secondary | ICD-10-CM | POA: Insufficient documentation

## 2016-03-23 DIAGNOSIS — K047 Periapical abscess without sinus: Secondary | ICD-10-CM

## 2016-03-23 DIAGNOSIS — I10 Essential (primary) hypertension: Secondary | ICD-10-CM | POA: Diagnosis not present

## 2016-03-23 DIAGNOSIS — K0889 Other specified disorders of teeth and supporting structures: Secondary | ICD-10-CM | POA: Diagnosis present

## 2016-03-23 LAB — CBC WITH DIFFERENTIAL/PLATELET
Basophils Absolute: 0.1 10*3/uL (ref 0–0.1)
Basophils Relative: 1 %
EOS ABS: 0.2 10*3/uL (ref 0–0.7)
Eosinophils Relative: 1 %
HEMATOCRIT: 37.4 % (ref 35.0–47.0)
HEMOGLOBIN: 12.6 g/dL (ref 12.0–16.0)
Lymphocytes Relative: 15 %
Lymphs Abs: 1.9 10*3/uL (ref 1.0–3.6)
MCH: 28.8 pg (ref 26.0–34.0)
MCHC: 33.8 g/dL (ref 32.0–36.0)
MCV: 85.4 fL (ref 80.0–100.0)
Monocytes Absolute: 0.8 10*3/uL (ref 0.2–0.9)
Monocytes Relative: 6 %
Neutro Abs: 9.8 10*3/uL — ABNORMAL HIGH (ref 1.4–6.5)
Platelets: 275 10*3/uL (ref 150–440)
RBC: 4.38 MIL/uL (ref 3.80–5.20)
RDW: 14.9 % — ABNORMAL HIGH (ref 11.5–14.5)
WBC: 12.7 10*3/uL — ABNORMAL HIGH (ref 3.6–11.0)

## 2016-03-23 MED ORDER — HYDROMORPHONE HCL 1 MG/ML IJ SOLN: 1.0000 mg | Freq: Once | INTRAMUSCULAR | Status: DC

## 2016-03-23 MED ORDER — ONDANSETRON 4 MG PO TBDP
4.0000 mg | ORAL_TABLET | Freq: Once | ORAL | Status: AC
  Administered 2016-03-23: 4 mg via ORAL

## 2016-03-23 MED ORDER — OXYCODONE-ACETAMINOPHEN 7.5-325 MG PO TABS: 1.0000 | ORAL_TABLET | ORAL | Status: AC | PRN

## 2016-03-23 MED ORDER — ONDANSETRON HCL 4 MG/2ML IJ SOLN: 4.0000 mg | Freq: Once | INTRAMUSCULAR | Status: DC

## 2016-03-23 MED ORDER — ERYTHROMYCIN BASE 500 MG PO TABS: 500.0000 mg | ORAL_TABLET | Freq: Four times a day (QID) | ORAL | Status: DC

## 2016-03-23 MED ORDER — HYDROMORPHONE HCL 1 MG/ML IJ SOLN
1.0000 mg | Freq: Once | INTRAMUSCULAR | Status: AC
  Administered 2016-03-23: 1 mg via INTRAMUSCULAR
  Filled 2016-03-23: qty 1

## 2016-03-23 NOTE — ED Notes (Signed)
See triage note states she developed some facial swelling about 2 days ago  Unsure if she has a broken tooth or abscess states pain is radiating to back of neck

## 2016-03-23 NOTE — Discharge Instructions (Signed)
Follow-up fullness of dental clinics provided. OPTIONS FOR DENTAL FOLLOW UP CARE  [place] [facility] Health and [facility] Services - Local Safety Net Dental Clinics [email or web address]   [facility] [phone])  Sharl Ma [phone])  [place] [phone])  [facility] [phone])  [facility] [phone]) This clinic caters to the indigent population and is on a lottery system. Location: [employer] [facility], [employer], [address] [place] Clinic Hours: Wednesdays from 6pm - 9pm, patients seen by a lottery system. For dates, call or go to [email or web address] Services: Cleanings, fillings and simple extractions. Payment Options: DENTAL WORK IS FREE OF CHARGE. Bring proof of income or support. Best way to get seen: Arrive at 5:15 pm - this is a lottery, NOT first come/first serve, so arriving earlier will not increase your chances of being seen.     [facility] [phone] Select option 1 for emergencies   Location: [facility], [place], [address], [place] Clinic Hours: No walk-ins accepted - call the day before to schedule an appointment. Check in times are 9:30 am and 1:30 pm. Services: Simple extractions, temporary fillings, pulpectomy/pulp debridement, uncomplicated abscess drainage. Payment Options: PAYMENT IS DUE AT THE TIME OF SERVICE.  Fee is usually $100-200, additional surgical procedures (e.g. abscess drainage) may be extra. Cash, checks, Visa/MasterCard accepted.  Can file Medicaid if patient is covered for dental - patient should call case worker to check. No discount for [facility] patients. Best way to get seen: MUST call the day before and get onto the schedule. Can usually be seen the next 1-2 days. No walk-ins accepted.      [facility] [phone]   Location: [facility], [address], [place] Clinic Hours: M, W, Th, F 8am or 1:30pm, Tues 9a or 1:30 - first come/first served. Services: Simple extractions, temporary fillings, uncomplicated abscess drainage.  You do not need to be an [facility] resident. Payment Options: PAYMENT IS DUE AT THE TIME OF SERVICE. Dental insurance, otherwise sliding scale - bring proof of income or support. Depending on income and treatment needed, cost is usually $50-200. Best way to get seen: Arrive early as it is first come/first served.     [facility] [phone]   Location: [address] Clinic Hours: Mon-Thu 8a-5p Services: Most basic dental services including extractions and fillings. Payment Options: PAYMENT IS DUE AT THE TIME OF SERVICE. Sliding scale, up to 50% off - bring proof if income or support. Medicaid with dental option accepted. Best way to get seen: Call to schedule an appointment, can usually be seen within 2 weeks OR they will try to see walk-ins - show up at 8a or 2p (you may have to wait).     [facility] [phone] [place] RESIDENTS ONLY   Location: [facility], [address] [place], [place] [address] Clinic Hours: By appointment only. Monday - Thursday 8am-5pm, Friday 8am-12pm Services: Cleanings, fillings, extractions. Payment Options: PAYMENT IS DUE AT THE TIME OF SERVICE. Cash, Visa or MasterCard. Sliding scale - $30 minimum per service. Best way to get seen: Come in to office, complete packet and make an appointment - need proof of income or support monies for each household member and proof of [facility] residence. Usually takes about a month to get in.     [facility] [phone]  Location: [address] [place] [facility] Hours: Walk-in Urgent Care  Dental Services are offered Monday-Friday mornings only. The numbers of emergencies accepted daily is limited to the number of providers available. Maximum 15 - Mondays, Wednesdays & Thursdays Maximum 10 - Tuesdays & Fridays Services: You do not need to be a [place] resident to be seen for a dental emergency. Emergencies are defined as pain, swelling, abnormal bleeding, or dental trauma. Walkins will receive x-rays if needed. NOTE: Dental cleaning is not an emergency. Payment Options: PAYMENT IS DUE AT THE TIME OF SERVICE. Minimum co-pay is $40.00 for uninsured patients. Minimum co-pay is $3.00 for Medicaid with dental coverage. Dental Insurance is accepted and must be presented at time of visit. Medicare does not cover dental. Forms of payment: Cash, credit card, checks. Best way to get seen: If not previously registered with the clinic, walk-in dental registration begins at 7:15 am and is on a first come/first serve basis. If previously registered with the clinic, call to make an appointment.     [facility] [phone] [place] RESIDENTS ONLY   Location: [address], [place], [place] Clinic Hours: Mon-Thu 10a-2p Services: Extractions only! Payment Options: FREE (donations accepted) - bring proof of income or support Best way to get seen: Call and schedule an appointment OR come at 8am on the 1st Monday of every month (except for holidays) when it is first come/first served.     [facility] [phone]   Location: [address], [place] Clinic Hours: Friday mornings Services, Payment Options, Best way to get seen: Call for info  Dental Abscess A dental abscess is pus in or around a tooth. HOME CARE  Take medicines only as told by your dentist.  If you were prescribed antibiotic medicine, finish all of it even if you start to feel better.  Rinse your mouth (gargle) often with salt water.  Do not drive or use heavy machinery, like  a lawn mower, while taking pain medicine.  Do not apply heat to the outside of your mouth.  Keep all follow-up visits as told by your dentist. This is important. GET HELP IF:  Your pain is worse, and medicine does not help. GET HELP RIGHT AWAY IF:  You have a fever or chills.  Your symptoms suddenly get worse.  You have a very bad headache.  You have problems breathing or swallowing.  You have trouble opening your mouth.  You have puffiness (swelling) in your neck or around your eye.   This information is not intended to replace advice given to you by your health care provider. Make sure you discuss any questions you have with your health care provider.   Document Released: 02/09/2015 Document Reviewed: 02/09/2015 Elsevier Interactive Patient Education [employer]2016 Elsevier Inc.

## 2016-03-23 NOTE — ED Notes (Signed)
Pt with possible dental infection for two days, states her mouth is swollen. No shortness of breath.

## 2016-03-23 NOTE — ED Notes (Signed)
Attempted IV started several times w/o success  Provider aware

## 2016-03-23 NOTE — ED Provider Notes (Signed)
[facility] Emergency Department Provider Note    ____________________________________________  Time seen: Approximately 2:28 PM  I have reviewed the triage vital signs and the nursing notes.   HISTORY  Chief Complaint Oral Swelling    HPI Latasha Lawrence is a 45 y.o. female patient presents with 2 days of increasing left facial swelling. Patient has a history of devitalized teeth and gingivitis. Patient stated pain radiates to the left cervical area. Patient states admitting fever. No palliative measures for this complaint. Patient state unable to seek dental care secondary to lack of dental insurance. Patient rates the pain as a 7/10.  Past Medical History  Diagnosis Date  . Depression   . Stomach ulcer   . Hypertension   . Anxiety   . Fibromyalgia   . Vitamin D deficiency disease   . Hirsutism   . Hypokalemia   . Neutrophilic leukocytosis   . Periodontal disease   . Abnormal weight gain   . Chronic back pain   . Controlled substance agreement terminated 07/2014    failed UDS at [facility]    Patient Active Problem List   Diagnosis Date Noted  . Hypertension   . Fibromyalgia   . Vitamin D deficiency disease   . Hirsutism   . Hypokalemia   . Neutrophilic leukocytosis   . Periodontal disease   . Abnormal weight gain   . Chronic back pain     Past Surgical History  Procedure Laterality Date  . Cesarean section    . Arm surgery      Current Outpatient Rx  Name  Route  Sig  Dispense  Refill  . omeprazole (PRILOSEC) 40 MG capsule   Oral   Take 40 mg by mouth daily.         Marland Kitchen venlafaxine (EFFEXOR) 75 MG tablet   Oral   Take 75 mg by mouth 2 (two) times daily.         . cyclobenzaprine (FLEXERIL) 10 MG tablet   Oral   Take 1 tablet (10 mg total) by mouth 3 (three) times daily as needed for muscle spasms.   15 tablet   1   . erythromycin base (E-MYCIN) 500 MG tablet   Oral   Take 1 tablet (500 mg total) by  mouth 4 (four) times daily.   40 tablet   0   . fluticasone (FLONASE) 50 MCG/ACT nasal spray   Each Nare   Place 1 spray into both nostrils 2 (two) times daily as needed for allergies or rhinitis.   16 g   0   . HYDROcodone-acetaminophen (NORCO) 5-325 MG tablet   Oral   Take 1 tablet by mouth every 6 (six) hours as needed for moderate pain.   15 tablet   0   . ondansetron (ZOFRAN) 4 MG tablet   Oral   Take 1 tablet (4 mg total) by mouth every 8 (eight) hours as needed for nausea or vomiting.   30 tablet   1   . oxyCODONE-acetaminophen (PERCOCET) 7.5-325 MG tablet   Oral   Take 1 tablet by mouth every 4 (four) hours as needed for severe pain.   20 tablet   0   . oxyCODONE-acetaminophen (ROXICET) 5-325 MG tablet   Oral   Take 1 tablet by mouth every 6 (six) hours as needed for severe pain.   8 tablet   0     Allergies Morphine and related; Nsaids; and Penicillins  Family History  Problem Relation Age of Onset  . Arthritis Mother   . Cancer Mother     breast  . Mental illness Mother   . Thyroid disease Mother   . Hyperlipidemia Father   . Hypertension Father     Social History Social History  Substance Use Topics  . Smoking status: Current Every Day Smoker -- 0.50 packs/day  . Smokeless tobacco: None  . Alcohol Use: No    Review of Systems Constitutional: Intermittent fever and chills Eyes: No visual changes. ENT: No sore throat. Dental pain secondary to periodontal disease. Cardiovascular: Denies chest pain. Respiratory: Denies shortness of breath. Gastrointestinal: No abdominal pain.  No nausea, no vomiting.  No diarrhea.  No constipation. Genitourinary: Negative for dysuria. Musculoskeletal: Negative for back pain. Skin: Negative for rash. Neurological: Negative for headaches, focal weakness or numbness. Psychiatric:Anxiety and depression Endocrine:Hypertension Hematological/Lymphatic: Allergic/Immunilogical: NSAIDs, penicillin, and morphine.  Patient states she can tolerate Dilaudid.  ____________________________________________   PHYSICAL EXAM:  VITAL SIGNS: ED Triage Vitals  Enc Vitals Group     BP 03/23/16 1421 157/86 mmHg     Pulse Rate 03/23/16 1421 81     Resp 03/23/16 1421 18     Temp 03/23/16 1421 98.9 F (37.2 C)     Temp Source 03/23/16 1421 Oral     SpO2 03/23/16 1421 100 %     Weight 03/23/16 1421 200 lb (90.719 kg)     Height 03/23/16 1421 5\' 6"  (1.676 m)     Head Cir --      Peak Flow --      Pain Score 03/23/16 1421 7     Pain Loc --      Pain Edu? --      Excl. in GC? --     Constitutional: Alert and oriented. Moderate distress  Eyes: Conjunctivae are normal. PERRL. EOMI. Head: Atraumatic. Nose: No congestion/rhinnorhea. Mouth/Throat: Mucous membranes are moist.  Oropharynx non-erythematous. Left maxillary and mandible edema. Edematous erythematous gingiva. Multiple fractured and devitalized teeth upper and lower molar area. Neck: No stridor. No cervical spine tenderness to palpation. Hematological/Lymphatic/Immunilogical: No cervical lymphadenopathy. Cardiovascular: Normal rate, regular rhythm. Grossly normal heart sounds.  Good peripheral circulation. Elevated blood pressure Respiratory: Normal respiratory effort.  No retractions. Lungs CTAB. Gastrointestinal: Soft and nontender. No distention. No abdominal bruits. No CVA tenderness. Musculoskeletal: No lower extremity tenderness nor edema.  No joint effusions. Neurologic:  Normal speech and language. No gross focal neurologic deficits are appreciated. No gait instability. Skin:  Skin is warm, dry and intact. No rash noted. Psychiatric: Mood and affect are normal. Speech and behavior are normal.  ____________________________________________   LABS (all labs ordered are listed, but only abnormal results are displayed)  Labs Reviewed  CBC WITH DIFFERENTIAL/PLATELET - Abnormal; Notable for the following:    WBC 12.7 (*)    RDW 14.9 (*)     Neutro Abs 9.8 (*)    All other components within normal limits  BASIC METABOLIC PANEL  POC URINE PREG, ED   ____________________________________________  EKG   ____________________________________________  RADIOLOGY   ____________________________________________   PROCEDURES  Procedure(s) performed: None  Critical Care performed: No  ____________________________________________   INITIAL IMPRESSION / ASSESSMENT AND PLAN / ED COURSE  Pertinent labs & imaging results that were available during my care of the patient were reviewed by me and considered in my medical decision making (see chart for details).  Dental abscess. Patient given discharge Instructions. Patient will start erythromycin and take Percocet for pain.  Patient advised to call [facility] dental clinic in the morning for appointment. ____________________________________________   FINAL CLINICAL IMPRESSION(S) / ED DIAGNOSES  Final diagnoses:  Abscess, dental      NEW MEDICATIONS STARTED DURING THIS VISIT:  New Prescriptions   ERYTHROMYCIN BASE (E-MYCIN) 500 MG TABLET    Take 1 tablet (500 mg total) by mouth 4 (four) times daily.   OXYCODONE-ACETAMINOPHEN (PERCOCET) 7.5-325 MG TABLET    Take 1 tablet by mouth every 4 (four) hours as needed for severe pain.     Note:  This document was prepared using Dragon voice recognition software and may include unintentional dictation errors.    Joni Reining, PA-C 03/23/16 1639  Phineas Semen, MD 03/24/16 [phone]

## 2016-06-07 ENCOUNTER — Emergency Department
Admission: EM | Admit: 2016-06-07 | Discharge: 2016-06-07 | Disposition: A | Discharge status: Home / Self Care | Attending: Emergency Medicine | Admitting: Emergency Medicine

## 2016-06-07 ENCOUNTER — Encounter: Payer: Self-pay | Admitting: *Deleted

## 2016-06-07 DIAGNOSIS — F17200 Nicotine dependence, unspecified, uncomplicated: Secondary | ICD-10-CM | POA: Insufficient documentation

## 2016-06-07 DIAGNOSIS — I10 Essential (primary) hypertension: Secondary | ICD-10-CM | POA: Diagnosis not present

## 2016-06-07 DIAGNOSIS — K0889 Other specified disorders of teeth and supporting structures: Secondary | ICD-10-CM | POA: Diagnosis present

## 2016-06-07 DIAGNOSIS — Z79899 Other long term (current) drug therapy: Secondary | ICD-10-CM | POA: Diagnosis not present

## 2016-06-07 MED ORDER — ACETAMINOPHEN-CODEINE #3 300-30 MG PO TABS: 1.0000 | ORAL_TABLET | ORAL | 0 refills | Status: DC | PRN

## 2016-06-07 MED ORDER — AMOXICILLIN 500 MG PO TABS: 500.0000 mg | ORAL_TABLET | Freq: Three times a day (TID) | ORAL | 0 refills | Status: DC

## 2016-06-07 NOTE — ED Triage Notes (Signed)
Pt has right tooth abscess, swelling noted to right side of face

## 2016-06-07 NOTE — ED Provider Notes (Signed)
[facility] Emergency Department Provider Note ____________________________________________  Time seen: Approximately 3:46 PM  I have reviewed the triage vital signs and the nursing notes.   HISTORY  Chief Complaint Dental Pain   HPI Latasha BorsRobin J Lawrence is a 45 y.o. female who presents to the ER for dental pain. She states pain started 2 days ago, but noticed swelling in her upper lip increasing throughout the day. She has taken tylenol without relief. [facility] and is waiting for her appointment.  Past Medical History:  Diagnosis Date  . Abnormal weight gain   . Anxiety   . Chronic back pain   . Controlled substance agreement terminated 07/2014   [facility]  . Depression   . Fibromyalgia   . Hirsutism   . Hypertension   . Hypokalemia   . Neutrophilic leukocytosis   . Periodontal disease   . Stomach ulcer   . Vitamin D deficiency disease     Patient Active Problem List   Diagnosis Date Noted  . Hypertension   . Fibromyalgia   . Vitamin D deficiency disease   . Hirsutism   . Hypokalemia   . Neutrophilic leukocytosis   . Periodontal disease   . Abnormal weight gain   . Chronic back pain     Past Surgical History:  Procedure Laterality Date  . Arm Surgery    . CESAREAN SECTION      Prior to Admission medications   Medication Sig Start Date End Date Taking? Authorizing Provider  acetaminophen-codeine (TYLENOL #3) 300-30 MG tablet Take 1-2 tablets by mouth every 4 (four) hours as needed for moderate pain. 06/07/16   Chinita Pesterari B Akari Crysler, FNP  amoxicillin (AMOXIL) 500 MG tablet Take 1 tablet (500 mg total) by mouth 3 (three) times daily. 06/07/16   Chinita Pesterari B Dwayne Bulkley, FNP  cyclobenzaprine (FLEXERIL) 10 MG tablet Take 1 tablet (10 mg total) by mouth 3 (three) times daily as needed for muscle spasms. 05/26/15   Myrna Blazeravid Matthew Schaevitz, MD  erythromycin base (E-MYCIN) 500 MG tablet Take 1 tablet (500 mg total)  by mouth 4 (four) times daily. 03/23/16   Joni Reiningonald K Smith, PA-C  fluticasone (FLONASE) 50 MCG/ACT nasal spray Place 1 spray into both nostrils 2 (two) times daily as needed for allergies or rhinitis. 05/26/15 05/25/16  Myrna Blazeravid Matthew Schaevitz, MD  HYDROcodone-acetaminophen (NORCO) 5-325 MG tablet Take 1 tablet by mouth every 6 (six) hours as needed for moderate pain. 07/31/15   Evon Slackhomas C Gaines, PA-C  omeprazole (PRILOSEC) 40 MG capsule Take 40 mg by mouth daily.    Historical Provider, MD  ondansetron (ZOFRAN) 4 MG tablet Take 1 tablet (4 mg total) by mouth every 8 (eight) hours as needed for nausea or vomiting. 07/31/15 07/30/16  Evon Slackhomas C Gaines, PA-C  oxyCODONE-acetaminophen (PERCOCET) 7.5-325 MG tablet Take 1 tablet by mouth every 4 (four) hours as needed for severe pain. 03/23/16 03/23/17  Joni Reiningonald K Smith, PA-C  oxyCODONE-acetaminophen (ROXICET) 5-325 MG tablet Take 1 tablet by mouth every 6 (six) hours as needed for severe pain. 07/21/15   Sharman CheekPhillip Stafford, MD  venlafaxine (EFFEXOR) 75 MG tablet Take 75 mg by mouth 2 (two) times daily.    Historical Provider, MD    Allergies Morphine and related and Nsaids  Family History  Problem Relation Age of Onset  . Arthritis Mother   . Cancer Mother     breast  . Mental illness Mother   . Thyroid disease Mother   .  Hyperlipidemia Father   . Hypertension Father     Social History Social History  Substance Use Topics  . Smoking status: Current Every Day Smoker    Packs/day: 0.50  . Smokeless tobacco: Never Used  . Alcohol use No    Review of Systems Constitutional: Appears well. Negative for fever ENT: Positive for dental pain. Musculoskeletal: Negative for jaw pain/trismus. Skin: Positive for swelling, negative for erythema. ____________________________________________   PHYSICAL EXAM:  VITAL SIGNS: ED Triage Vitals  Enc Vitals Group     BP 06/07/16 1523 (!) 171/97     Pulse Rate 06/07/16 1523 87     Resp 06/07/16 1523 20     Temp  06/07/16 1523 98.4 F (36.9 C)     Temp Source 06/07/16 1523 Oral     SpO2 06/07/16 1523 99 %     Weight 06/07/16 1521 200 lb (90.7 kg)     Height 06/07/16 1521 5\' 6"  (1.676 m)     Head Circumference --      Peak Flow --      Pain Score 06/07/16 1522 8     Pain Loc --      Pain Edu? --      Excl. in GC? --     Constitutional: Alert and oriented. Well appearing and in no acute distress. Eyes: Conjunctivae are normal.EOMI. Mouth/Throat: Mucous membranes are moist. Oropharynx non-erythematous. Periodontal Exam    Hematological/Lymphatic/Immunilogical: No cervical lymphadenopathy. Respiratory: Normal respiratory effort.  Musculoskeletal: Full ROM x 4 extremities. Neurologic:  Normal speech and language. No gross focal neurologic deficits are appreciated. Speech is normal. No gait instability. Psychiatric: Mood and affect are normal. Speech and behavior are normal.  ____________________________________________   LABS (all labs ordered are listed, but only abnormal results are displayed)  Labs Reviewed - No data to display ____________________________________________   RADIOLOGY  Not indicated. ____________________________________________   PROCEDURES  Procedure(s) performed: None  Critical Care performed: No  ____________________________________________   INITIAL IMPRESSION / ASSESSMENT AND PLAN / ED COURSE  Pertinent labs & imaging results that were available during my care of the patient were reviewed by me and considered in my medical decision making (see chart for details). Patient will be prescribed Amoxicillin and Tylenol 3. She states that she previously had an allergy to penicillins, but has taken it since without any adverse reactions. Allergy was removed from her list today. Patient was advised to see the dentist within 14 days. Also advised to take the antibiotic until finished. Instructed to return to the ER for symptoms that change or worsen if unable to  schedule an appointment. ____________________________________________   FINAL CLINICAL IMPRESSION(S) / ED DIAGNOSES  Final diagnoses:  Pain, dental    Note:  This document was prepared using Dragon voice recognition software and may include unintentional dictation errors.     Chinita Pester, FNP 06/07/16 1608    Arnaldo Natal, MD 06/07/16 2249

## 2016-06-07 NOTE — Discharge Instructions (Signed)
Please call and schedule a dental appointment as soon as possible. You will need to be seen within the next 14 days. Return to the emergency department for symptoms that change or worsen if you're unable to schedule an appointment.  OPTIONS FOR DENTAL FOLLOW UP CARE  [place] Department of Health and Human Services - Local Safety Net Dental Clinics http://www.ncdhhs.gov/dph/oralhealth/services/safetynetclinics.htm   [facility] ([phone])  [facility] ([phone])  [facility] ([phone])  [facility] ([phone])  [facility] ([phone]) This clinic caters to the indigent population and is on a lottery system. Location: [facility], [address], [place] Clinic Hours: Wednesdays from 6pm - 9pm, patients seen by a lottery system. For dates, call or go to [email or web address] Services: Cleanings, fillings and simple extractions. Payment Options: DENTAL WORK IS FREE OF CHARGE. Bring proof of income or support. Best way to get seen: Arrive at 5:15 pm - this is a lottery, NOT first come/first serve, so arriving earlier will not increase your chances of being seen.     [facility] [phone] Select option 1 for emergencies   Location: [facility], [address], [place] Clinic Hours: No walk-ins accepted - call the day before to schedule an appointment. Check in times are 9:30 am and 1:30 pm. Services: Simple extractions, temporary fillings, pulpectomy/pulp debridement, uncomplicated abscess drainage. Payment Options: PAYMENT IS DUE AT THE TIME OF SERVICE.  Fee is usually $100-200, additional surgical procedures (e.g. abscess drainage) may be extra. Cash, checks, Visa/MasterCard accepted.  Can file Medicaid if patient is covered for dental - patient should call case worker to check. No  discount for [facility] patients. Best way to get seen: MUST call the day before and get onto the schedule. Can usually be seen the next 1-2 days. No walk-ins accepted.     [facility] [phone]   Location: [facility], [address], [place] Clinic Hours: M, W, Th, F 8am or 1:30pm, Tues 9a or 1:30 - first come/first served. Services: Simple extractions, temporary fillings, uncomplicated abscess drainage.  You do not need to be an [place] resident. Payment Options: PAYMENT IS DUE AT THE TIME OF SERVICE. Dental insurance, otherwise sliding scale - bring proof of income or support. Depending on income and treatment needed, cost is usually $50-200. Best way to get seen: Arrive early as it is first come/first served.     [facility] [phone]   Location: [address] Clinic Hours: Mon-Thu 8a-5p Services: Most basic dental services including extractions and fillings. Payment Options: PAYMENT IS DUE AT THE TIME OF SERVICE. Sliding scale, up to 50% off - bring proof if income or support. Medicaid with dental option accepted. Best way to get seen: Call to schedule an appointment, can usually be seen within 2 weeks OR they will try to see walk-ins - show up at 8a or 2p (you may have to wait).     [facility] [phone] [place] RESIDENTS ONLY   Location: [facility], [address], [place], [place] [address] Clinic Hours: By appointment only. Monday - Thursday 8am-5pm, Friday 8am-12pm Services: Cleanings, fillings, extractions. Payment Options: PAYMENT IS DUE AT THE TIME OF SERVICE. Cash, Visa or MasterCard. Sliding scale - $30 minimum per service. Best way to get seen: Come in to office, complete packet and make an appointment -   need proof of income or support monies for each household member and proof of [place]  residence. Usually takes about a month to get in.     [facility] [phone]   Location: [address] [place] Clinic Hours: Walk-in Urgent Care Dental Services are offered Monday-Friday mornings only. The numbers of emergencies accepted daily is limited to the number of providers available. Maximum 15 - Mondays, Wednesdays & Thursdays Maximum 10 - Tuesdays & Fridays Services: You do not need to be a [place] resident to be seen for a dental emergency. Emergencies are defined as pain, swelling, abnormal bleeding, or dental trauma. Walkins will receive x-rays if needed. NOTE: Dental cleaning is not an emergency. Payment Options: PAYMENT IS DUE AT THE TIME OF SERVICE. Minimum co-pay is $40.00 for uninsured patients. Minimum co-pay is $3.00 for Medicaid with dental coverage. Dental Insurance is accepted and must be presented at time of visit. Medicare does not cover dental. Forms of payment: Cash, credit card, checks. Best way to get seen: If not previously registered with the clinic, walk-in dental registration begins at 7:15 am and is on a first come/first serve basis. If previously registered with the clinic, call to make an appointment.     [facility] [phone] [place] RESIDENTS ONLY   Location: [address], [place], [place] Clinic Hours: Mon-Thu 10a-2p Services: Extractions only! Payment Options: FREE (donations accepted) - bring proof of income or support Best way to get seen: Call and schedule an appointment OR come at 8am on the 1st Monday of every month (except for holidays) when it is first come/first served.     [facility] [phone]   Location: [address], [place] Clinic Hours: Friday mornings Services, Payment Options, Best way to get seen: Call for info  

## 2016-06-16 ENCOUNTER — Other Ambulatory Visit: Payer: Self-pay | Admitting: Family Medicine

## 2016-06-16 DIAGNOSIS — Z1231 Encounter for screening mammogram for malignant neoplasm of breast: Secondary | ICD-10-CM

## 2016-06-16 DIAGNOSIS — N760 Acute vaginitis: Secondary | ICD-10-CM | POA: Insufficient documentation

## 2016-06-16 DIAGNOSIS — A630 Anogenital (venereal) warts: Secondary | ICD-10-CM | POA: Insufficient documentation

## 2016-06-16 DIAGNOSIS — Z8659 Personal history of other mental and behavioral disorders: Secondary | ICD-10-CM | POA: Insufficient documentation

## 2016-07-27 ENCOUNTER — Ambulatory Visit: Attending: Family Medicine

## 2016-08-14 IMAGING — CR DG LUMBAR SPINE COMPLETE 4+V
1 series · 5 of 5 positions shown · non-contrast
Comparison: None.

CLINICAL DATA: Low back pain. RIGHT leg pain. Chronic back pain.
Initial encounter.

EXAM:
LUMBAR SPINE - COMPLETE 4+ VIEW

[Series 1: ap · 0.17mm/px · 5 of 5 slices shown]
[im 1/5]
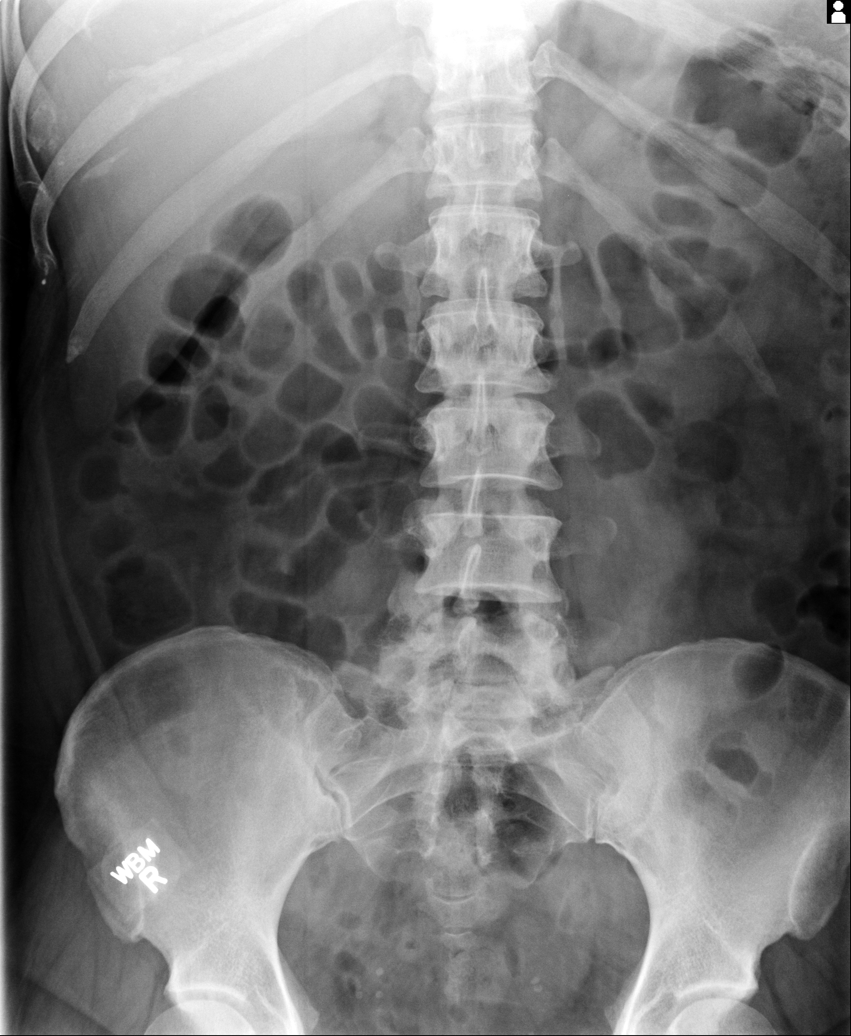
[im 2/5]
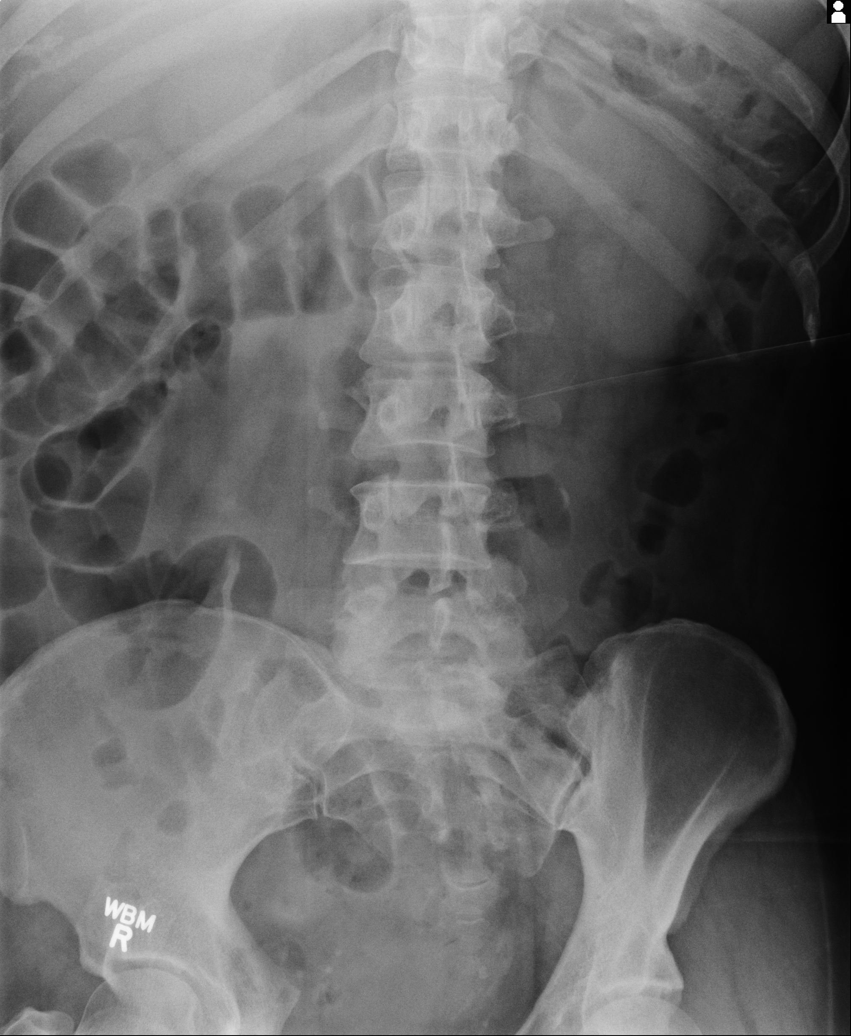
[im 3/5]
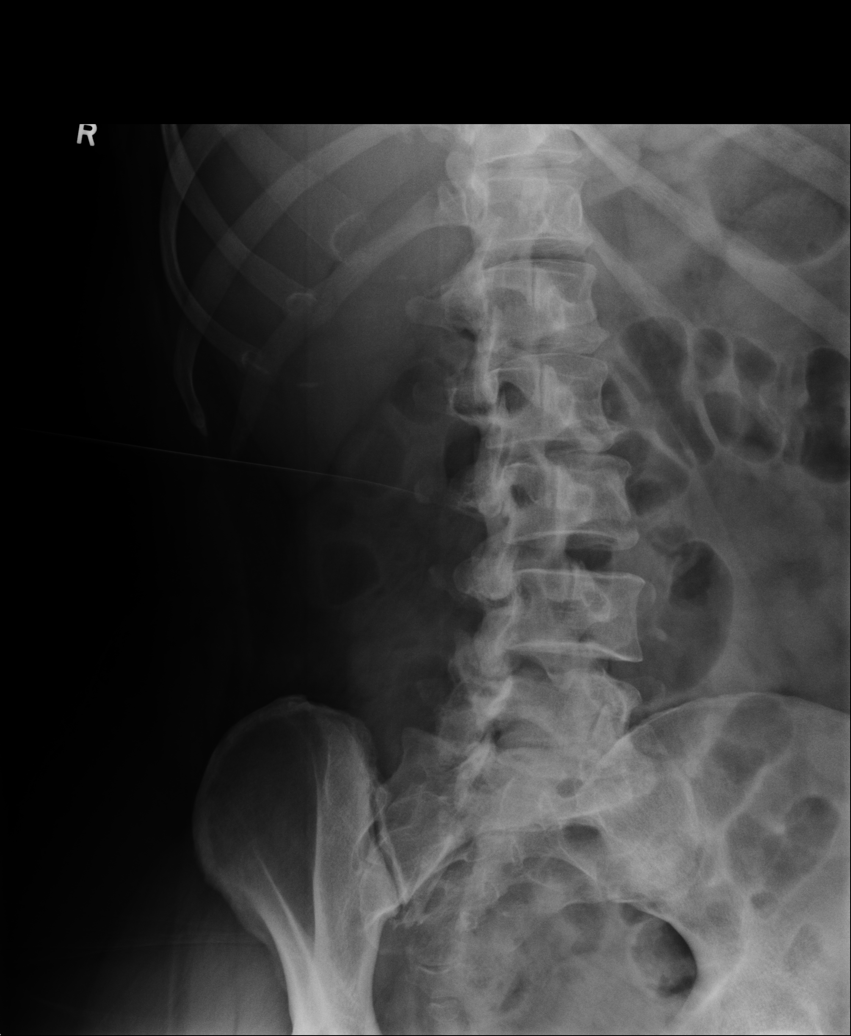
[im 4/5]
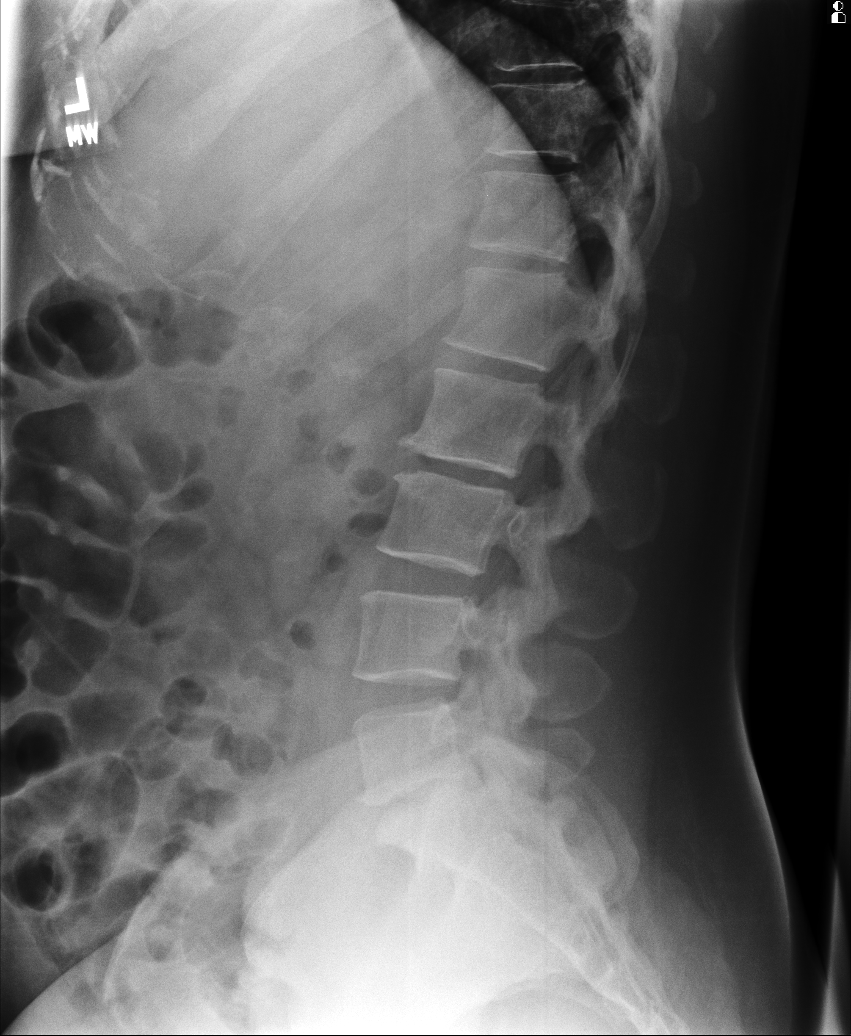
[im 5/5]
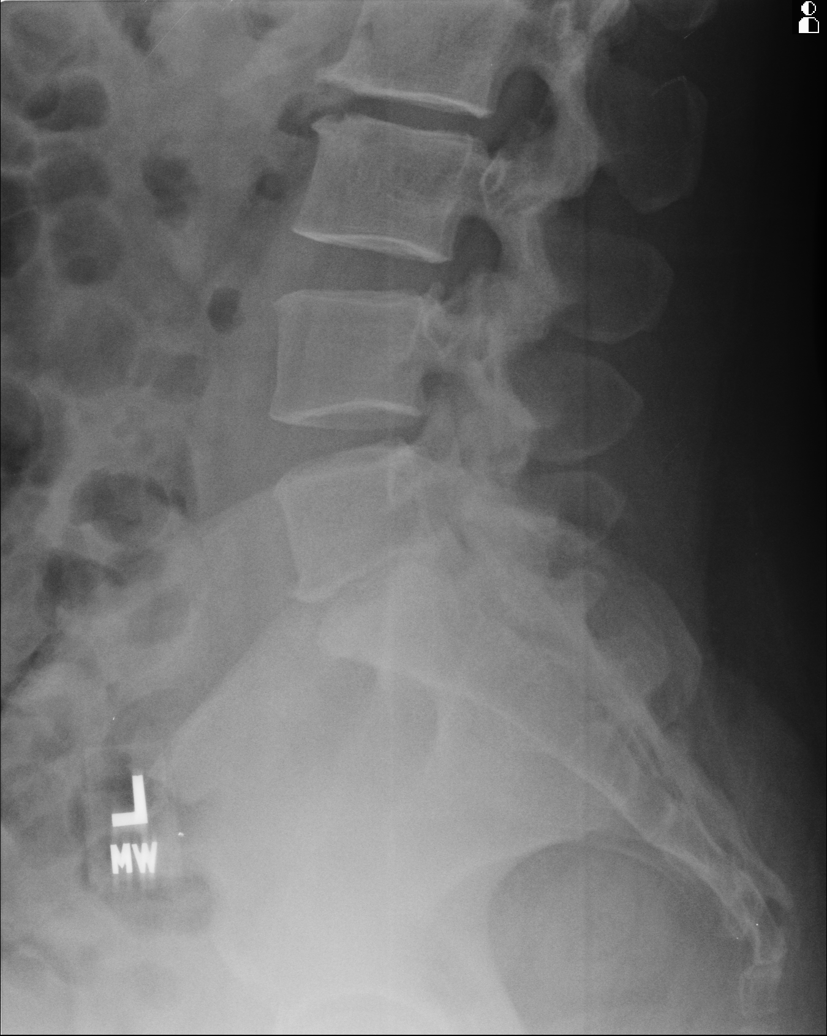

[5 of 5 positions shown; findings below may reference images not displayed]

FINDINGS: The alignment of the lumbar spine is within normal limits. Lumbar
spondylosis is present with moderate disc space loss at L2-L3 and
L5-S1. Five lumbar type vertebral bodies are present. There is no
fracture. Osteophytes are present in both L2-L3 and L5-S1. There are
no pars defects.
IMPRESSION: Moderate lumbar spondylosis.

## 2016-08-24 DIAGNOSIS — E669 Obesity, unspecified: Secondary | ICD-10-CM | POA: Insufficient documentation

## 2016-08-28 DIAGNOSIS — E785 Hyperlipidemia, unspecified: Secondary | ICD-10-CM | POA: Insufficient documentation

## 2017-01-03 ENCOUNTER — Encounter: Payer: Self-pay | Admitting: Emergency Medicine

## 2017-01-03 DIAGNOSIS — M797 Fibromyalgia: Secondary | ICD-10-CM | POA: Diagnosis not present

## 2017-01-03 DIAGNOSIS — I10 Essential (primary) hypertension: Secondary | ICD-10-CM | POA: Diagnosis not present

## 2017-01-03 DIAGNOSIS — F17200 Nicotine dependence, unspecified, uncomplicated: Secondary | ICD-10-CM | POA: Insufficient documentation

## 2017-01-03 MED ORDER — OXYCODONE-ACETAMINOPHEN 5-325 MG PO TABS
1.0000 | ORAL_TABLET | ORAL | Status: DC | PRN
Start: 2017-01-03 — End: 2017-01-04
  Administered 2017-01-03: 1 via ORAL

## 2017-01-03 MED ORDER — OXYCODONE-ACETAMINOPHEN 5-325 MG PO TABS
ORAL_TABLET | ORAL | Status: AC
  Filled 2017-01-03: qty 1

## 2017-01-03 NOTE — ED Triage Notes (Signed)
Patient with complaint of hurting all over from her fibromyalgia. Patient reports that the pain started this evening . Patient reports that she has taken the maximum dose of her Neurontin and tylenol at home.

## 2017-01-04 ENCOUNTER — Emergency Department
Admission: EM | Admit: 2017-01-04 | Discharge: 2017-01-04 | Disposition: A | Discharge status: Home / Self Care | Attending: Emergency Medicine | Admitting: Emergency Medicine

## 2017-01-04 DIAGNOSIS — M797 Fibromyalgia: Secondary | ICD-10-CM | POA: Diagnosis not present

## 2017-01-04 MED ORDER — DIAZEPAM 2 MG PO TABS
2.0000 mg | ORAL_TABLET | Freq: Once | ORAL | Status: AC
  Administered 2017-01-04: 2 mg via ORAL
  Filled 2017-01-04: qty 1

## 2017-01-04 MED ORDER — HYDROMORPHONE HCL 1 MG/ML IJ SOLN
1.0000 mg | Freq: Once | INTRAMUSCULAR | Status: AC
  Administered 2017-01-04: 1 mg via INTRAMUSCULAR
  Filled 2017-01-04: qty 1

## 2017-01-04 NOTE — ED Provider Notes (Signed)
[facility] Emergency Department Provider Note   ____________________________________________   First MD Initiated Contact with Patient 01/04/17 0016     (approximate)  I have reviewed the triage vital signs and the nursing notes.   HISTORY  Chief Complaint Fibromyalgia    HPI Latasha Lawrence is a 46 y.o. female who presents to the ED from home with a chief complaint of fibromyalgia pain. Patient reports awakening yesterday morning with pain all over. Over the course of the day patient reports she has taken the maximum dose of her Neurontin and Tylenol. Describes musculoskeletal pain from her head to her toes. Denies associated fever, chills, chest pain, shortness of breath, abdominal pain, nausea, vomiting, diarrhea.Denies recent travel or trauma. Does report increased stress this week. Nothing makes her symptoms better or worse.   Past Medical History:  Diagnosis Date  . Abnormal weight gain   . Anxiety   . Chronic back pain   . Controlled substance agreement terminated 07/2014   [facility]  . Depression   . Fibromyalgia   . Hirsutism   . Hypertension   . Hypokalemia   . Neutrophilic leukocytosis   . Periodontal disease   . Stomach ulcer   . Vitamin D deficiency disease     Patient Active Problem List   Diagnosis Date Noted  . Hypertension   . Fibromyalgia   . Vitamin D deficiency disease   . Hirsutism   . Hypokalemia   . Neutrophilic leukocytosis   . Periodontal disease   . Abnormal weight gain   . Chronic back pain     Past Surgical History:  Procedure Laterality Date  . Arm Surgery    . CESAREAN SECTION      Prior to Admission medications   Medication Sig Start Date End Date Taking? Authorizing Provider  acetaminophen-codeine (TYLENOL #3) 300-30 MG tablet Take 1-2 tablets by mouth every 4 (four) hours as needed for moderate pain. 06/07/16   Chinita Pesterari B Triplett, FNP  amoxicillin (AMOXIL) 500 MG tablet  Take 1 tablet (500 mg total) by mouth 3 (three) times daily. 06/07/16   Chinita Pesterari B Triplett, FNP  cyclobenzaprine (FLEXERIL) 10 MG tablet Take 1 tablet (10 mg total) by mouth 3 (three) times daily as needed for muscle spasms. 05/26/15   Myrna Blazeravid Matthew Schaevitz, MD  erythromycin base (E-MYCIN) 500 MG tablet Take 1 tablet (500 mg total) by mouth 4 (four) times daily. 03/23/16   Joni Reiningonald K Smith, PA-C  fluticasone (FLONASE) 50 MCG/ACT nasal spray Place 1 spray into both nostrils 2 (two) times daily as needed for allergies or rhinitis. 05/26/15 05/25/16  Myrna Blazeravid Matthew Schaevitz, MD  HYDROcodone-acetaminophen (NORCO) 5-325 MG tablet Take 1 tablet by mouth every 6 (six) hours as needed for moderate pain. 07/31/15   Evon Slackhomas C Gaines, PA-C  omeprazole (PRILOSEC) 40 MG capsule Take 40 mg by mouth daily.    Historical Provider, MD  oxyCODONE-acetaminophen (PERCOCET) 7.5-325 MG tablet Take 1 tablet by mouth every 4 (four) hours as needed for severe pain. 03/23/16 03/23/17  Joni Reiningonald K Smith, PA-C  oxyCODONE-acetaminophen (ROXICET) 5-325 MG tablet Take 1 tablet by mouth every 6 (six) hours as needed for severe pain. 07/21/15   Sharman CheekPhillip Stafford, MD  venlafaxine (EFFEXOR) 75 MG tablet Take 75 mg by mouth 2 (two) times daily.    Historical Provider, MD    Allergies Morphine and related and Nsaids  Family History  Problem Relation Age of Onset  . Arthritis Mother   .  Cancer Mother     breast  . Mental illness Mother   . Thyroid disease Mother   . Hyperlipidemia Father   . Hypertension Father     Social History Social History  Substance Use Topics  . Smoking status: Current Every Day Smoker    Packs/day: 0.50  . Smokeless tobacco: Never Used  . Alcohol use No    Review of Systems  Constitutional: No fever/chills. Eyes: No visual changes. ENT: No sore throat. Cardiovascular: Denies chest pain. Respiratory: Denies shortness of breath. Gastrointestinal: No abdominal pain.  No nausea, no vomiting.  No diarrhea.   No constipation. Genitourinary: Negative for dysuria. Musculoskeletal: Positive for generalized pain. Skin: Negative for rash. Neurological: Negative for headaches, focal weakness or numbness.  10-point ROS otherwise negative.  ____________________________________________   PHYSICAL EXAM:  VITAL SIGNS: ED Triage Vitals  Enc Vitals Group     BP 01/03/17 2220 (!) 145/92     Pulse Rate 01/03/17 2219 100     Resp 01/03/17 2219 20     Temp 01/03/17 2219 98.7 F (37.1 C)     Temp Source 01/03/17 2219 Oral     SpO2 01/03/17 2219 100 %     Weight 01/03/17 2219 180 lb (81.6 kg)     Height 01/03/17 2219 5\' 6"  (1.676 m)     Head Circumference --      Peak Flow --      Pain Score 01/03/17 2219 10     Pain Loc --      Pain Edu? --      Excl. in GC? --     Constitutional: Alert and oriented. Well appearing and in mild acute distress. Eyes: Conjunctivae are normal. PERRL. EOMI. Head: Atraumatic. Nose: No congestion/rhinnorhea. Mouth/Throat: Mucous membranes are moist.  Oropharynx non-erythematous. Neck: No stridor.  Supple neck without meningismus. Cardiovascular: Normal rate, regular rhythm. Grossly normal heart sounds.  Good peripheral circulation. Respiratory: Normal respiratory effort.  No retractions. Lungs CTAB. Gastrointestinal: Soft and nontender. No distention. No abdominal bruits. No CVA tenderness. Musculoskeletal: No lower extremity tenderness nor edema.  No joint effusions. Neurologic:  Normal speech and language. No gross focal neurologic deficits are appreciated.  Skin:  Skin is warm, dry and intact. No rash noted. Psychiatric: Mood and affect are normal. Speech and behavior are normal.  ____________________________________________   LABS (all labs ordered are listed, but only abnormal results are displayed)  Labs Reviewed - No data to  display ____________________________________________  EKG  None ____________________________________________  RADIOLOGY  None ____________________________________________   PROCEDURES  Procedure(s) performed: None  Procedures  Critical Care performed: No  ____________________________________________   INITIAL IMPRESSION / ASSESSMENT AND PLAN / ED COURSE  Pertinent labs & imaging results that were available during my care of the patient were reviewed by me and considered in my medical decision making (see chart for details).  46 year old female who presents with "fibromyalgia attack". Received one Percocet while awaiting treatment room and states her pain is still 10/10 but she is no longer tearful as she was previously. She will receive a single dose of IM Dilaudid, oral Valium and she will follow-up with her PCP this week. Strict return precautions given. Patient verbalizes understanding and agrees with plan of care.  I reviewed the patient's prescription history over the last 12 months in the multi-state controlled substances database(s) that includes [place], [place], [place], [place], [place], [place], [place], [place], [place], [place], [place], [place], [place], and [place].  Results were notable  for last filled opiate pain medications 06/07/2016.      ____________________________________________   FINAL CLINICAL IMPRESSION(S) / ED DIAGNOSES  Final diagnoses:  Fibromyalgia muscle pain      NEW MEDICATIONS STARTED DURING THIS VISIT:  New Prescriptions   No medications on file     Note:  This document was prepared using Dragon voice recognition software and may include unintentional dictation errors.    Irean Hong, MD 01/04/17 [phone]

## 2017-01-04 NOTE — Discharge Instructions (Signed)
Continue all medications as directed by your doctor.  Return to the ER for worsening symptoms, persistent vomiting, difficulty breathing or other concerns. °

## 2017-04-02 ENCOUNTER — Emergency Department
Admission: EM | Admit: 2017-04-02 | Discharge: 2017-04-02 | Disposition: A | Discharge status: Home / Self Care | Attending: Emergency Medicine | Admitting: Emergency Medicine

## 2017-04-02 ENCOUNTER — Encounter: Payer: Self-pay | Admitting: Emergency Medicine

## 2017-04-02 DIAGNOSIS — I10 Essential (primary) hypertension: Secondary | ICD-10-CM | POA: Diagnosis not present

## 2017-04-02 DIAGNOSIS — Z79899 Other long term (current) drug therapy: Secondary | ICD-10-CM | POA: Diagnosis not present

## 2017-04-02 DIAGNOSIS — F17200 Nicotine dependence, unspecified, uncomplicated: Secondary | ICD-10-CM | POA: Diagnosis not present

## 2017-04-02 DIAGNOSIS — G8929 Other chronic pain: Secondary | ICD-10-CM | POA: Diagnosis not present

## 2017-04-02 DIAGNOSIS — M797 Fibromyalgia: Secondary | ICD-10-CM

## 2017-04-02 DIAGNOSIS — M791 Myalgia: Secondary | ICD-10-CM | POA: Diagnosis present

## 2017-04-02 MED ORDER — HYDROMORPHONE HCL 1 MG/ML IJ SOLN
1.0000 mg | Freq: Once | INTRAMUSCULAR | Status: AC
  Administered 2017-04-02: 1 mg via INTRAMUSCULAR
  Filled 2017-04-02: qty 1

## 2017-04-02 MED ORDER — DIAZEPAM 5 MG PO TABS
5.0000 mg | ORAL_TABLET | Freq: Once | ORAL | Status: AC
  Administered 2017-04-02: 5 mg via ORAL
  Filled 2017-04-02: qty 1

## 2017-04-02 MED ORDER — CYCLOBENZAPRINE HCL 10 MG PO TABS: 10.0000 mg | ORAL_TABLET | Freq: Three times a day (TID) | ORAL | 0 refills | Status: DC | PRN

## 2017-04-02 MED ORDER — METHYLPREDNISOLONE SODIUM SUCC 125 MG IJ SOLR
125.0000 mg | Freq: Once | INTRAMUSCULAR | Status: AC
  Administered 2017-04-02: 125 mg via INTRAMUSCULAR
  Filled 2017-04-02: qty 2

## 2017-04-02 MED ORDER — PREDNISONE 50 MG PO TABS: 50.0000 mg | ORAL_TABLET | Freq: Every day | ORAL | 0 refills | Status: DC

## 2017-04-02 NOTE — ED Notes (Signed)
Pt c/o generalized body aches xfew days, denies fever or n/v/d. Pt states she has fibromyalgia and per PCP couldn't do "anything else for her so so she would like to be further evaluated. "

## 2017-04-02 NOTE — ED Notes (Signed)
Pt denies wanting wheelchair for d/c states she wants to walk

## 2017-04-02 NOTE — ED Provider Notes (Signed)
[facility] Emergency Department Provider Note  ____________________________________________  Time seen: Approximately 10:50 PM  I have reviewed the triage vital signs and the nursing notes.   HISTORY  Chief Complaint Generalized Body Aches    HPI Latasha Lawrence is a 46 y.o. female who presents emergency department complaining of a fibromyalgia flare. Patient reports that she has been trialed on multiple antidepressant medications and is currently on Effexor and gabapentin for her fibromyalgia. Patient reports that she called her primary care provider today states that she is on max doses of both medications. Patient reports that she used to be on chronic pain management but felt a drug screen and was no longer to contract. Patient reports that she has a history of gastric ulcers from taking too much ibuprofen and is unable to take NSAIDs due to her residual gastric ulcer. Patient denies any fevers or chills, chest pain, shortness of breath, nausea, vomiting. Patient reports that side effects of her medications include daily diarrhea but states that this is not changed from baseline. No history of trauma. Patient reports that her symptoms are consistent with fibromyalgia flare. She is requesting medications here in the emergency department to "get me over the flare so I can get back to my normal life."    Past Medical History:  Diagnosis Date  . Abnormal weight gain   . Anxiety   . Chronic back pain   . Controlled substance agreement terminated 07/2014   failed UDS at [facility]  . Depression   . Fibromyalgia   . Hirsutism   . Hypertension   . Hypokalemia   . Neutrophilic leukocytosis   . Periodontal disease   . Stomach ulcer   . Vitamin D deficiency disease     Patient Active Problem List   Diagnosis Date Noted  . Hypertension   . Fibromyalgia   . Vitamin D deficiency disease   . Hirsutism   . Hypokalemia   . Neutrophilic leukocytosis    . Periodontal disease   . Abnormal weight gain   . Chronic back pain     Past Surgical History:  Procedure Laterality Date  . Arm Surgery    . CESAREAN SECTION      Prior to Admission medications   Medication Sig Start Date End Date Taking? Authorizing Provider  acetaminophen-codeine (TYLENOL #3) 300-30 MG tablet Take 1-2 tablets by mouth every 4 (four) hours as needed for moderate pain. 06/07/16   Triplett, Cari B, FNP  amoxicillin (AMOXIL) 500 MG tablet Take 1 tablet (500 mg total) by mouth 3 (three) times daily. 06/07/16   Triplett, Rulon Eisenmenger B, FNP  cyclobenzaprine (FLEXERIL) 10 MG tablet Take 1 tablet (10 mg total) by mouth 3 (three) times daily as needed for muscle spasms. 04/02/17   Diogenes Whirley, Delorise Royals, PA-C  erythromycin base (E-MYCIN) 500 MG tablet Take 1 tablet (500 mg total) by mouth 4 (four) times daily. 03/23/16   Joni Reining, PA-C  fluticasone (FLONASE) 50 MCG/ACT nasal spray Place 1 spray into both nostrils 2 (two) times daily as needed for allergies or rhinitis. 05/26/15 05/25/16  Myrna Blazer, MD  HYDROcodone-acetaminophen (NORCO) 5-325 MG tablet Take 1 tablet by mouth every 6 (six) hours as needed for moderate pain. 07/31/15   Evon Slack, PA-C  omeprazole (PRILOSEC) 40 MG capsule Take 40 mg by mouth daily.    [provider]  oxyCODONE-acetaminophen (ROXICET) 5-325 MG tablet Take 1 tablet by mouth every 6 (six) hours as needed  for severe pain. 07/21/15   Sharman Cheek, MD  predniSONE (DELTASONE) 50 MG tablet Take 1 tablet (50 mg total) by mouth daily with breakfast. 04/02/17   Jansel Vonstein, Delorise Royals, PA-C  venlafaxine (EFFEXOR) 75 MG tablet Take 75 mg by mouth 2 (two) times daily.    [provider]    Allergies Morphine and related and Nsaids  Family History  Problem Relation Age of Onset  . Arthritis Mother   . Cancer Mother        breast  . Mental illness Mother   . Thyroid disease Mother   . Hyperlipidemia Father   .  Hypertension Father     Social History Social History  Substance Use Topics  . Smoking status: Current Every Day Smoker    Packs/day: 0.50  . Smokeless tobacco: Never Used  . Alcohol use No     Review of Systems  Constitutional: No fever/chills Eyes: No visual changes. No discharge ENT: No upper respiratory complaints. Cardiovascular: no chest pain. Respiratory: no cough. No SOB. Gastrointestinal: No abdominal pain.  No nausea, no vomiting.  No diarrhea.  No constipation. Genitourinary: Negative for dysuria. No hematuria Musculoskeletal: Positive for generalized myalgias and arthralgias. Skin: Negative for rash, abrasions, lacerations, ecchymosis. Neurological: Negative for headaches, focal weakness or numbness. 10-point ROS otherwise negative.  ____________________________________________   PHYSICAL EXAM:  VITAL SIGNS: ED Triage Vitals  Enc Vitals Group     BP 04/02/17 2225 (!) 156/99     Pulse Rate 04/02/17 2225 90     Resp 04/02/17 2225 16     Temp 04/02/17 2225 98 F (36.7 C)     Temp Source 04/02/17 2225 Oral     SpO2 04/02/17 2225 99 %     Weight 04/02/17 2225 180 lb (81.6 kg)     Height --      Head Circumference --      Peak Flow --      Pain Score 04/02/17 2243 10     Pain Loc --      Pain Edu? --      Excl. in GC? --      Constitutional: Alert and oriented. Well appearing and in no acute distress. Eyes: Conjunctivae are normal. PERRL. EOMI. Head: Atraumatic. ENT:      Ears:       Nose: No congestion/rhinnorhea.      Mouth/Throat: Mucous membranes are moist.  Neck: No stridor. Neck is supple with full range of motion Hematological/Lymphatic/Immunilogical: No cervical lymphadenopathy. Cardiovascular: Normal rate, regular rhythm. Normal S1 and S2.  Good peripheral circulation. Respiratory: Normal respiratory effort without tachypnea or retractions. Lungs CTAB. Good air entry to the bases with no decreased or absent breath sounds. Gastrointestinal:  Bowel sounds 4 quadrants. Soft and nontender to palpation. No guarding or rigidity. No palpable masses. No distention. No CVA tenderness. Musculoskeletal: Full range of motion to all extremities. No gross deformities appreciated. Neurologic:  Normal speech and language. No gross focal neurologic deficits are appreciated.  Skin:  Skin is warm, dry and intact. No rash noted. Psychiatric: Mood and affect are normal. Speech and behavior are normal. Patient exhibits appropriate insight and judgement.   ____________________________________________   LABS (all labs ordered are listed, but only abnormal results are displayed)  Labs Reviewed - No data to display ____________________________________________  EKG   ____________________________________________  RADIOLOGY   No results found.  ____________________________________________    PROCEDURES  Procedure(s) performed:    Procedures    Medications  HYDROmorphone (DILAUDID)  injection 1 mg (1 mg Intramuscular Given 04/02/17 2313)  diazepam (VALIUM) tablet 5 mg (5 mg Oral Given 04/02/17 2313)  methylPREDNISolone sodium succinate (SOLU-MEDROL) 125 mg/2 mL injection 125 mg (125 mg Intramuscular Given 04/02/17 2313)     ____________________________________________   INITIAL IMPRESSION / ASSESSMENT AND PLAN / ED COURSE  Pertinent labs & imaging results that were available during my care of the patient were reviewed by me and considered in my medical decision making (see chart for details).  Review of the [place] CSRS was performed in accordance of the [facility] prior to dispensing any controlled drugs.     Patient's diagnosis is consistent with fibromyalgia flare.Patient verbalizes understanding that she will not be discharged on any controlled substances. She will receive a single dose of IM pain medication, oral Valium, injection of steroids. She will be discharged with oral prednisone and muscle relaxer. Patient is agreeable to this  plan.. Patient will follow-up with primary care as needed. Patient is given ED precautions to return to the ED for any worsening or new symptoms.     ____________________________________________  FINAL CLINICAL IMPRESSION(S) / ED DIAGNOSES  Final diagnoses:  Fibromyalgia muscle pain      NEW MEDICATIONS STARTED DURING THIS VISIT:  New Prescriptions   CYCLOBENZAPRINE (FLEXERIL) 10 MG TABLET    Take 1 tablet (10 mg total) by mouth 3 (three) times daily as needed for muscle spasms.   PREDNISONE (DELTASONE) 50 MG TABLET    Take 1 tablet (50 mg total) by mouth daily with breakfast.        This chart was dictated using voice recognition software/Dragon. Despite best efforts to proofread, errors can occur which can change the meaning. Any change was purely unintentional.    Racheal PatchesCuthriell, Nonna Renninger D, PA-C 04/02/17 2332    Phineas SemenGoodman, Graydon, MD 04/03/17 [id number]

## 2017-04-02 NOTE — ED Triage Notes (Signed)
Pt to triage in wheelchair due to pain. Pt c/o pain in lower back, shoulders, neck, legs and in arms. Pt reports pain started on Sunday and pt has not had relied from home medications that she takes for fibromyalgia. No other symptoms reported.

## 2017-04-18 ENCOUNTER — Inpatient Hospital Stay (HOSPITAL_COMMUNITY)

## 2017-04-18 ENCOUNTER — Encounter: Payer: Self-pay | Admitting: Emergency Medicine

## 2017-04-18 ENCOUNTER — Emergency Department
Admission: EM | Admit: 2017-04-18 | Discharge: 2017-04-18 | Disposition: A | Discharge status: Other Acute Inpatient Hospital | Attending: Emergency Medicine | Admitting: Emergency Medicine

## 2017-04-18 ENCOUNTER — Inpatient Hospital Stay (HOSPITAL_COMMUNITY)
Admission: AD | Admit: 2017-04-18 | Discharge: 2017-04-20 | DRG: 064 | Disposition: A | Discharge status: Home / Self Care | Source: Other Acute Inpatient Hospital | Attending: Neurology | Admitting: Neurology

## 2017-04-18 ENCOUNTER — Emergency Department

## 2017-04-18 ENCOUNTER — Encounter (HOSPITAL_COMMUNITY): Payer: Self-pay | Admitting: Emergency Medicine

## 2017-04-18 DIAGNOSIS — I6522 Occlusion and stenosis of left carotid artery: Secondary | ICD-10-CM | POA: Diagnosis present

## 2017-04-18 DIAGNOSIS — I639 Cerebral infarction, unspecified: Secondary | ICD-10-CM

## 2017-04-18 DIAGNOSIS — E669 Obesity, unspecified: Secondary | ICD-10-CM | POA: Diagnosis present

## 2017-04-18 DIAGNOSIS — I10 Essential (primary) hypertension: Secondary | ICD-10-CM | POA: Diagnosis present

## 2017-04-18 DIAGNOSIS — G518 Other disorders of facial nerve: Secondary | ICD-10-CM | POA: Insufficient documentation

## 2017-04-18 DIAGNOSIS — I7771 Dissection of carotid artery: Secondary | ICD-10-CM | POA: Diagnosis present

## 2017-04-18 DIAGNOSIS — R4781 Slurred speech: Secondary | ICD-10-CM | POA: Diagnosis not present

## 2017-04-18 DIAGNOSIS — Z8711 Personal history of peptic ulcer disease: Secondary | ICD-10-CM

## 2017-04-18 DIAGNOSIS — Z8249 Family history of ischemic heart disease and other diseases of the circulatory system: Secondary | ICD-10-CM

## 2017-04-18 DIAGNOSIS — M797 Fibromyalgia: Secondary | ICD-10-CM | POA: Diagnosis present

## 2017-04-18 DIAGNOSIS — G8191 Hemiplegia, unspecified affecting right dominant side: Secondary | ICD-10-CM | POA: Diagnosis present

## 2017-04-18 DIAGNOSIS — I773 Arterial fibromuscular dysplasia: Secondary | ICD-10-CM | POA: Diagnosis not present

## 2017-04-18 DIAGNOSIS — F329 Major depressive disorder, single episode, unspecified: Secondary | ICD-10-CM | POA: Diagnosis present

## 2017-04-18 DIAGNOSIS — F17210 Nicotine dependence, cigarettes, uncomplicated: Secondary | ICD-10-CM | POA: Diagnosis present

## 2017-04-18 DIAGNOSIS — Z6832 Body mass index (BMI) 32.0-32.9, adult: Secondary | ICD-10-CM | POA: Diagnosis not present

## 2017-04-18 DIAGNOSIS — Z803 Family history of malignant neoplasm of breast: Secondary | ICD-10-CM | POA: Diagnosis not present

## 2017-04-18 DIAGNOSIS — R29706 NIHSS score 6: Secondary | ICD-10-CM | POA: Diagnosis present

## 2017-04-18 DIAGNOSIS — E785 Hyperlipidemia, unspecified: Secondary | ICD-10-CM | POA: Diagnosis present

## 2017-04-18 DIAGNOSIS — R471 Dysarthria and anarthria: Secondary | ICD-10-CM | POA: Diagnosis present

## 2017-04-18 DIAGNOSIS — R29810 Facial weakness: Secondary | ICD-10-CM | POA: Diagnosis present

## 2017-04-18 DIAGNOSIS — G8929 Other chronic pain: Secondary | ICD-10-CM | POA: Diagnosis present

## 2017-04-18 DIAGNOSIS — R1310 Dysphagia, unspecified: Secondary | ICD-10-CM | POA: Diagnosis present

## 2017-04-18 DIAGNOSIS — R319 Hematuria, unspecified: Secondary | ICD-10-CM | POA: Diagnosis present

## 2017-04-18 DIAGNOSIS — Z8673 Personal history of transient ischemic attack (TIA), and cerebral infarction without residual deficits: Secondary | ICD-10-CM | POA: Diagnosis not present

## 2017-04-18 DIAGNOSIS — Z818 Family history of other mental and behavioral disorders: Secondary | ICD-10-CM

## 2017-04-18 DIAGNOSIS — Z7982 Long term (current) use of aspirin: Secondary | ICD-10-CM

## 2017-04-18 DIAGNOSIS — I340 Nonrheumatic mitral (valve) insufficiency: Secondary | ICD-10-CM | POA: Diagnosis not present

## 2017-04-18 DIAGNOSIS — Z79899 Other long term (current) drug therapy: Secondary | ICD-10-CM | POA: Diagnosis not present

## 2017-04-18 DIAGNOSIS — Z87891 Personal history of nicotine dependence: Secondary | ICD-10-CM | POA: Diagnosis not present

## 2017-04-18 DIAGNOSIS — G936 Cerebral edema: Secondary | ICD-10-CM | POA: Diagnosis present

## 2017-04-18 DIAGNOSIS — I63412 Cerebral infarction due to embolism of left middle cerebral artery: Secondary | ICD-10-CM | POA: Diagnosis present

## 2017-04-18 DIAGNOSIS — R531 Weakness: Secondary | ICD-10-CM

## 2017-04-18 LAB — DIFFERENTIAL
Basophils Absolute: 0.1 10*3/uL (ref 0–0.1)
Basophils Relative: 0 %
Eosinophils Absolute: 0.1 10*3/uL (ref 0–0.7)
Eosinophils Relative: 1 %
LYMPHS PCT: 13 %
Lymphs Abs: 1.5 10*3/uL (ref 1.0–3.6)
MONO ABS: 0.5 10*3/uL (ref 0.2–0.9)
MONOS PCT: 5 %
NEUTROS ABS: 9.3 10*3/uL — AB (ref 1.4–6.5)
Neutrophils Relative %: 81 %

## 2017-04-18 LAB — APTT: aPTT: 33 seconds (ref 24–36)

## 2017-04-18 LAB — COMPREHENSIVE METABOLIC PANEL
ALK PHOS: 71 U/L (ref 38–126)
ALT: 18 U/L (ref 14–54)
AST: 21 U/L (ref 15–41)
Albumin: 3.9 g/dL (ref 3.5–5.0)
Anion gap: 9 (ref 5–15)
BUN: 6 mg/dL (ref 6–20)
CALCIUM: 9.2 mg/dL (ref 8.9–10.3)
CO2: 26 mmol/L (ref 22–32)
CREATININE: 0.86 mg/dL (ref 0.44–1.00)
Chloride: 102 mmol/L (ref 101–111)
GFR calc non Af Amer: >60 mL/min (ref >60)
Glucose, Bld: 159 mg/dL — ABNORMAL HIGH (ref 65–99)
Potassium: 3.7 mmol/L (ref 3.5–5.1)
SODIUM: 137 mmol/L (ref 135–145)
Total Bilirubin: 0.8 mg/dL (ref 0.3–1.2)
Total Protein: 7.7 g/dL (ref 6.5–8.1)

## 2017-04-18 LAB — CBC
HCT: 41.5 % (ref 36.0–46.0)
HEMATOCRIT: 41.6 % (ref 35.0–47.0)
HEMOGLOBIN: 13.9 g/dL (ref 12.0–16.0)
Hemoglobin: 13.7 g/dL (ref 12.0–15.0)
MCH: 28.9 pg (ref 26.0–34.0)
MCH: 29.4 pg (ref 26.0–34.0)
MCHC: 33.4 g/dL (ref 32.0–36.0)
MCHC: 33 g/dL (ref 30.0–36.0)
MCV: 86.6 fL (ref 80.0–100.0)
MCV: 89.1 fL (ref 78.0–100.0)
PLATELETS: 263 10*3/uL (ref 150–400)
Platelets: 311 10*3/uL (ref 150–440)
RBC: 4.81 MIL/uL (ref 3.80–5.20)
RBC: 4.66 MIL/uL (ref 3.87–5.11)
RDW: 14.5 % (ref 11.5–14.5)
RDW: 14.4 % (ref 11.5–15.5)
WBC: 11.5 10*3/uL — AB (ref 3.6–11.0)
WBC: 10.7 10*3/uL — AB (ref 4.0–10.5)

## 2017-04-18 LAB — PROTIME-INR
INR: 1.02
Prothrombin Time: 13.4 seconds (ref 11.4–15.2)

## 2017-04-18 LAB — ETHANOL

## 2017-04-18 LAB — MRSA PCR SCREENING: MRSA by PCR: NEGATIVE

## 2017-04-18 LAB — CREATININE, SERUM
CREATININE: 0.75 mg/dL (ref 0.44–1.00)
GFR calc non Af Amer: >60 mL/min (ref >60)

## 2017-04-18 LAB — TROPONIN I: Troponin I: <0.03 ng/mL (ref <0.03)

## 2017-04-18 MED ORDER — HYDROMORPHONE HCL 1 MG/ML IJ SOLN
1.0000 mg | Freq: Once | INTRAMUSCULAR | Status: AC
  Administered 2017-04-18: 1 mg via INTRAVENOUS

## 2017-04-18 MED ORDER — SENNOSIDES-DOCUSATE SODIUM 8.6-50 MG PO TABS: 1.0000 | ORAL_TABLET | Freq: Every evening | ORAL | Status: DC | PRN

## 2017-04-18 MED ORDER — ATORVASTATIN CALCIUM 80 MG PO TABS
80.0000 mg | ORAL_TABLET | Freq: Every day | ORAL | Status: DC
  Administered 2017-04-18 – 2017-04-19 (×2): 80 mg via ORAL
  Filled 2017-04-18 (×3): qty 1

## 2017-04-18 MED ORDER — ASPIRIN 81 MG PO CHEW
81.0000 mg | CHEWABLE_TABLET | Freq: Once | ORAL | Status: AC
  Administered 2017-04-19: 81 mg via ORAL
  Filled 2017-04-18: qty 1

## 2017-04-18 MED ORDER — SODIUM CHLORIDE 0.9 % IV SOLN
INTRAVENOUS | Status: DC
  Administered 2017-04-18 – 2017-04-19 (×3): via INTRAVENOUS

## 2017-04-18 MED ORDER — MORPHINE SULFATE (PF) 2 MG/ML IV SOLN
INTRAVENOUS | Status: AC
  Filled 2017-04-18: qty 1

## 2017-04-18 MED ORDER — ACETAMINOPHEN 650 MG RE SUPP
650.0000 mg | RECTAL | Status: DC | PRN
Start: 2017-04-18 — End: 2017-04-20

## 2017-04-18 MED ORDER — STROKE: EARLY STAGES OF RECOVERY BOOK
Freq: Once | Status: DC
  Filled 2017-04-18: qty 1

## 2017-04-18 MED ORDER — ACETAMINOPHEN 325 MG PO TABS
650.0000 mg | ORAL_TABLET | ORAL | Status: DC | PRN
  Administered 2017-04-18 – 2017-04-19 (×2): 650 mg via ORAL
  Filled 2017-04-18 (×2): qty 2

## 2017-04-18 MED ORDER — ASPIRIN EC 81 MG PO TBEC
81.0000 mg | DELAYED_RELEASE_TABLET | Freq: Every day | ORAL | Status: DC
  Administered 2017-04-18: 81 mg via ORAL
  Filled 2017-04-18: qty 1

## 2017-04-18 MED ORDER — ACETAMINOPHEN 325 MG PO TABS: 650.0000 mg | ORAL_TABLET | ORAL | Status: DC | PRN

## 2017-04-18 MED ORDER — ACETAMINOPHEN 160 MG/5ML PO SOLN: 650.0000 mg | ORAL | Status: DC | PRN

## 2017-04-18 MED ORDER — CLOPIDOGREL BISULFATE 75 MG PO TABS
75.0000 mg | ORAL_TABLET | Freq: Every day | ORAL | Status: DC
  Administered 2017-04-18: 75 mg via ORAL
  Filled 2017-04-18: qty 1

## 2017-04-18 MED ORDER — HYDROMORPHONE HCL 1 MG/ML IJ SOLN
INTRAMUSCULAR | Status: AC
  Filled 2017-04-18: qty 1

## 2017-04-18 MED ORDER — HEPARIN SODIUM (PORCINE) 5000 UNIT/ML IJ SOLN
5000.0000 [IU] | Freq: Three times a day (TID) | INTRAMUSCULAR | Status: DC
  Administered 2017-04-18 – 2017-04-20 (×4): 5000 [IU] via SUBCUTANEOUS
  Filled 2017-04-18 (×5): qty 1

## 2017-04-18 MED ORDER — IOPAMIDOL (ISOVUE-370) INJECTION 76%
75.0000 mL | Freq: Once | INTRAVENOUS | Status: AC | PRN
  Administered 2017-04-18: 75 mL via INTRAVENOUS

## 2017-04-18 NOTE — H&P (Signed)
Stroke Team Admission H&P  CC: numbness and weakness on right hemibody  History is obtained from: patient  HPI: Latasha Lawrence is a 46 y.o. female with a PMH of HTN,  fibromyalgia, depression, who  presented to [facility] emergency department for evaluation of numbness and weakness on the right hemibody. She said this numbness and weakness have been going on in her arm leg and face for the past few days but it wasn't until yesterday that she noticed right facial weakness of her lower face. Initially she attributed all these problems to her fibromyalgia and thought she was just having increased symptoms of her fibromyalgia but when this facial weakness is persistent and she also noted some gait instability, swerving to the right, she decided to come in for workup. She was seen at [facility] by Dr. Thad Ranger, who gave her initial stroke scale of 4. A noncontrast head CT showed multiple areas of hypodensities in the left MCA territory. CT of the head and neck was performed which showed a possible left internal carotid artery dissection versus thrombus. She was transferred to [facility] for higher level of care, should she worsen or her symptoms fluctuate. I accepted the patient on the phone from Dr. Thad Ranger  On the review of systems, patient denies any radicular weight loss, she denies any prior illnesses, fevers, chills, shortness of breath, chest pain, palpitations, abdominal pain, nausea, vomiting, easy bruising or bleeding. She denies any miscarriages or clotting in legs/lungs. Denies family history of bleeding or clotting disorders. No family history sugegstive of connective tissue disease.    LKW: 48h prior to presentation tpa given?: no, OSW ICH Score: NA Modified Rankin Score: 0  ROS: A 14 point ROS was performed and is negative except as noted in the HPI.   Past Medical History:  Diagnosis Date  . Abnormal weight gain   . Anxiety   .  Chronic back pain   . Controlled substance agreement terminated 07/2014   failed UDS at [facility]  . Depression   . Fibromyalgia   . Hirsutism   . Hypertension   . Hypokalemia   . Neutrophilic leukocytosis   . Periodontal disease   . Stomach ulcer   . Vitamin D deficiency disease      Family History  Problem Relation Age of Onset  . Arthritis Mother   . Cancer Mother        breast  . Mental illness Mother   . Thyroid disease Mother   . Hyperlipidemia Father   . Hypertension Father      Social History:  reports that she has been smoking.  She has been smoking about 0.50 packs per day. She has never used smokeless tobacco. She reports that she does not drink alcohol or use drugs.   Exam: Current vital signs: BP (!) 141/59 (BP Location: Right Arm)   Pulse 79   Temp 98.3 F (36.8 C) (Oral)   Resp 17   Ht 5\' 6"  (1.676 m)   Wt 92.2 kg (203 lb 4.2 oz)   LMP 03/26/2017   SpO2 96%   BMI 32.81 kg/m  Vital signs in last 24 hours: Temp:  [98 F (36.7 C)-98.3 F (36.8 C)] 98.3 F (36.8 C) (07/11 1718) Pulse Rate:  [79-96] 79 (07/11 1718) Resp:  [13-21] 17 (07/11 1718) BP: (132-152)/(59-81) 141/59 (07/11 1718) SpO2:  [96 %-98 %] 96 % (07/11 1718) Weight:  [81.6 kg (180 lb)-92.2 kg (203 lb 4.2  oz)] 92.2 kg (203 lb 4.2 oz) (07/11 1718)  Physical Exam  Constitutional: Appears well-developed and well-nourished.  Psych: Affect appropriate to situation Eyes: No scleral injection HENT: No OP obstrucion Head: Normocephalic.  Cardiovascular: Normal rate and regular rhythm.  Respiratory: Effort normal and breath sounds normal to anterior ascultation GI: Soft.  No distension. There is no tenderness.  Skin: WDI  Neuro: Mental Status: Patient is awake, alert, oriented to person, place, month, year, and situation. Patient is able to give a clear and coherent history. No signs of aphasia or neglect Speech mildly dysarthric Cranial Nerves: II: Visual Fields  are full. Pupils are equal, round, and reactive to light.  III,IV, VI: EOMI without ptosis or diploplia.  V: Facial sensation is symmetric to temperature VII: Face asymmetric with left lower facial weakness VIII: hearing is intact to voice X: Uvula elevates symmetrically XI: Shoulder shrug is symmetric. XII: tongue is midline without atrophy or fasciculations.  Motor: Tone is normal. Bulk is normal. Pronator drift on the right upper extremity, 4+/5 strength in the right upper extremity. No drift in the right lower extremity, 4+/5 in the right lower extremity. Normal first cholesterol in left upper and left lower extremity. Sensory: Decreased sensation to light touch in right hemibody. Deep Tendon Reflexes: 2+ and symmetric in the biceps and patellae. Plantars: Toes are downgoing bilaterally. Cerebellar: FNF and HKS are intact bilaterally  NIHSS 1a Level of Conscious.: 0 1b LOC Questions: 0 1c LOC Commands: 0 2 Best Gaze: 0 3 Visual: 0 4 Facial Palsy: 2 5a Motor Arm - left: 0 5b Motor Arm - Right: 1 6a Motor Leg - Left: 0 6b Motor Leg - Right: 0  7 Limb Ataxia: 0 8 Sensory: 2 9 Best Language: 0 10 Dysarthria: 1 11 Extinct. and Inatten.: 0 TOTAL: 6   LABS I have reviewed labs in epic and the results pertinent to this consultation are: CBC    Component Value Date/Time   WBC 11.5 (H) 04/18/2017 1302   RBC 4.81 04/18/2017 1302   HGB 13.9 04/18/2017 1302   HGB 14.1 09/06/2014 0132   HCT 41.6 04/18/2017 1302   HCT 43.9 09/06/2014 0132   PLT 311 04/18/2017 1302   PLT 400 09/06/2014 0132   MCV 86.6 04/18/2017 1302   MCV 86 09/06/2014 0132   MCH 28.9 04/18/2017 1302   MCHC 33.4 04/18/2017 1302   RDW 14.5 04/18/2017 1302   RDW 15.1 (H) 09/06/2014 0132   LYMPHSABS 1.5 04/18/2017 1302   LYMPHSABS 2.1 09/06/2014 0132   MONOABS 0.5 04/18/2017 1302   MONOABS 0.9 09/06/2014 0132   EOSABS 0.1 04/18/2017 1302   EOSABS 0.1 09/06/2014 0132   BASOSABS 0.1 04/18/2017 1302    BASOSABS 0.0 09/06/2014 0132   CMP     Component Value Date/Time   NA 137 04/18/2017 1302   NA 138 09/06/2014 0132   K 3.7 04/18/2017 1302   K 3.8 09/06/2014 0132   CL 102 04/18/2017 1302   CL 105 09/06/2014 0132   CO2 26 04/18/2017 1302   CO2 27 09/06/2014 0132   GLUCOSE 159 (H) 04/18/2017 1302   GLUCOSE 105 (H) 09/06/2014 0132   BUN 6 04/18/2017 1302   BUN 8 09/06/2014 0132   CREATININE 0.86 04/18/2017 1302   CREATININE 0.97 09/06/2014 0132   CALCIUM 9.2 04/18/2017 1302   CALCIUM 9.4 09/06/2014 0132   PROT 7.7 04/18/2017 1302   PROT 8.6 (H) 09/06/2014 0132   ALBUMIN 3.9 04/18/2017 1302   ALBUMIN  4.1 09/06/2014 0132   AST 21 04/18/2017 1302   AST < 5 (L) 09/06/2014 0132   ALT 18 04/18/2017 1302   ALT 21 09/06/2014 0132   ALKPHOS 71 04/18/2017 1302   ALKPHOS 86 09/06/2014 0132   BILITOT 0.8 04/18/2017 1302   BILITOT 0.2 09/06/2014 0132   GFRNONAA >60 04/18/2017 1302   GFRNONAA >60 09/06/2014 0132   GFRNONAA >60 01/21/2014 0531   GFRAA >60 04/18/2017 1302   GFRAA >60 09/06/2014 0132   GFRAA >60 01/21/2014 0531    I have reviewed the images obtained:  noncontrast CT of the head done at [facility] shows multiple areas of hypodensities in the left MCA territory. CT angiogram reveals a full stenosis in the left ICA, read as possible thrombus versus dissection. MRI-will order  Assessment  46 year old woman with reported history of numbness and weakness in the right hemibody. Noncontrast CT of the head shows multiple hypodensities in the left MCA territory. Head and neck vascular imaging concerning for left ICA thrombus versus dissection.  Not a candidate for IV TPA and IA because of outside of time mental.  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 Large vessel occlusion/stenosis v.  cardiooembolic Continue Evaluation:  -Admit to: stepdown -Continue Aspirin/ Statin -Blood pressure control, goal of SYS <220 -MRI/ECHO/A1C/Lipid panel. -Hyperglycemia management per SSI to maintain glucose 140-180mg /dL. -PT/OT/ST therapies and recommendations when able -Will need diagnostic angio tomorrow.  CNS -Close neuro monitoring -Inform on-call Neurologist of any exam change -Tylenol for chronic pain  Dysarthria Dysphagia following cerebral infarction  -NPO until cleared by  Bedside swallow eval. -Can have regular diet till midnight. NPO past midnight for possible angio.  Hemiplegia and hemiparesis following cerebral infarction affecting right dominant side -PT/OT  RESP Saturating well on RA Monitor clinically Obtain CXR  CVS Essential (primary) hypertension -allow permissive HTN -TTE  Hyperlipidemia, unspecified  - Statin for goal LDL < 70  HEME -Monitor -transfuse for hgb < 7   ENDO -A1c -goal HgbA1c < 7  GI/GU -Gentle hydration  Fluid/Electrolyte Disorders -Replete electrolytes to keep K>4 Mg>2 -Repeat labs in AM -IVNS 75cc/h  ID -monitor CBC -check UA  Nutrition E66.9 Obesity  -diet consult  Prophylaxis DVT: sqh   GI: NA Bowel: doc/senna   Diet: NPO past midnight after passing bedside swallow. NPO past midnight for possible angio in the AM  Code Status: Full Code     THE FOLLOWING WERE PRESENT ON ADMISSION: CNS -  Acute Ischemic Stroke, Cerebral Edema, Hemiparesis,   Milon Dikes, MD [facility] [phone]  If 7pm to 7am, please call on call as listed on AMION.

## 2017-04-18 NOTE — ED Notes (Signed)
Pt taken to CT via stretcher.

## 2017-04-18 NOTE — ED Notes (Signed)
[facility]  [facility] alert. Pt has a headache and neck pain.  Speech slurred.  nsr on monitor.

## 2017-04-18 NOTE — ED Provider Notes (Signed)
[facility] Emergency Department Provider Note   ____________________________________________   First MD Initiated Contact with Patient 04/18/17 1301     (approximate)  I have reviewed the triage vital signs and the nursing notes.   HISTORY  Chief Complaint Code Stroke    HPI Latasha Lawrence is a 46 y.o. female who reports she has fibromyalgia. She says she's been numb and weak all over for about 2 days but then woke up this morning from sleep with right sided numbness and weakness some slurry speech. She reports she has a mild headache. She went to bed at midnight woke up at about 10:30.   Past Medical History:  Diagnosis Date  . Abnormal weight gain   . Anxiety   . Chronic back pain   . Controlled substance agreement terminated 07/2014   [facility]DS [facility]  . Depression   . Fibromyalgia   . Hirsutism   . Hypertension   . Hypokalemia   . Neutrophilic leukocytosis   . Periodontal disease   . Stomach ulcer   . Vitamin D deficiency disease     Patient Active Problem List   Diagnosis Date Noted  . Hypertension   . Fibromyalgia   . Vitamin D deficiency disease   . Hirsutism   . Hypokalemia   . Neutrophilic leukocytosis   . Periodontal disease   . Abnormal weight gain   . Chronic back pain     Past Surgical History:  Procedure Laterality Date  . Arm Surgery    . CESAREAN SECTION      Prior to Admission medications   Medication Sig Start Date End Date Taking? Authorizing Provider  gabapentin (NEURONTIN) 100 MG capsule Take 100 mg by mouth at bedtime. Take 1 cap at night along with 300mg  cap for total night dose of 1000mg . 05/23/13  Yes [provider]  gabapentin (NEURONTIN) 300 MG capsule Take 3 capsules by mouth at bedtime. 05/23/13  Yes [provider]  acetaminophen-codeine (TYLENOL #3) 300-30 MG tablet Take 1-2 tablets by mouth every 4 (four) hours as needed for moderate pain. Patient not  taking: Reported on 04/18/2017 06/07/16   Kem Boroughsriplett, Cari B, FNP  amoxicillin (AMOXIL) 500 MG tablet Take 1 tablet (500 mg total) by mouth 3 (three) times daily. Patient not taking: Reported on 04/18/2017 06/07/16   Kem Boroughsriplett, Cari B, FNP  cyclobenzaprine (FLEXERIL) 10 MG tablet Take 1 tablet (10 mg total) by mouth 3 (three) times daily as needed for muscle spasms. Patient not taking: Reported on 04/18/2017 04/02/17   Cuthriell, Delorise RoyalsJonathan D, PA-C  erythromycin base (E-MYCIN) 500 MG tablet Take 1 tablet (500 mg total) by mouth 4 (four) times daily. Patient not taking: Reported on 04/18/2017 03/23/16   Joni ReiningSmith, Ronald K, PA-C  fluticasone [facility](FLONASE) 50 MCG/ACT nasal spray Place 1 spray into both nostrils 2 (two) times daily as needed for allergies or rhinitis. 05/26/15 05/25/16  Myrna BlazerSchaevitz, David Matthew, MD  HYDROcodone-acetaminophen (NORCO) 5-325 MG tablet Take 1 tablet by mouth every 6 (six) hours as needed for moderate pain. Patient not taking: Reported on 04/18/2017 07/31/15   Evon SlackGaines, Thomas C, PA-C  oxyCODONE-acetaminophen (ROXICET) 5-325 MG tablet Take 1 tablet by mouth every 6 (six) hours as needed for severe pain. Patient not taking: Reported on 04/18/2017 07/21/15   Sharman CheekStafford, Phillip, MD  predniSONE (DELTASONE) 50 MG tablet Take 1 tablet (50 mg total) by mouth daily with breakfast. Patient not taking: Reported on 04/18/2017 04/02/17   Cuthriell, Christiane HaJonathan  D, PA-C  venlafaxine (EFFEXOR) 75 MG tablet Take 75 mg by mouth 2 (two) times daily.    [provider]    Allergies Morphine and related and Nsaids  Family History  Problem Relation Age of Onset  . Arthritis Mother   . Cancer Mother        breast  . Mental illness Mother   . Thyroid disease Mother   . Hyperlipidemia Father   . Hypertension Father     Social History Social History  Substance Use Topics  . Smoking status: Current Every Day Smoker    Packs/day: 0.50  . Smokeless tobacco: Never Used  . Alcohol use No    Review of  Systems  Constitutional: No fever/chills Eyes: No visual changes. ENT: No sore throat. Cardiovascular: Denies chest pain. Respiratory: Denies shortness of breath. Gastrointestinal: No abdominal pain.  No nausea, no vomiting.  No diarrhea.  No constipation. Genitourinary: Negative for dysuria. Musculoskeletal: Negative for back pain. Skin: Negative for rash. Neurological: see history of present illness   ____________________________________________   PHYSICAL EXAM:  VITAL SIGNS: ED Triage Vitals  Enc Vitals Group     BP --      Pulse --      Resp --      Temp --      Temp src --      SpO2 --      Weight 04/18/17 1259 180 lb (81.6 kg)     Height 04/18/17 1259 5\' 6"  (1.676 m)     Head Circumference --      Peak Flow --      Pain Score 04/18/17 1253 0     Pain Loc --      Pain Edu? --      Excl. in GC? --     Constitutional: Alert and oriented. Well appearing and in no acute distress. Eyes: Conjunctivae are normal. PERRL. EOMI. Head: Atraumatic. Nose: No congestion/rhinnorhea. Mouth/Throat: Mucous membranes are moist.  Oropharynx non-erythematous. Neck: No stridor.  Cardiovascular: Normal rate, regular rhythm. Grossly normal heart sounds.  Good peripheral circulation. Respiratory: Normal respiratory effort.  No retractions. Lungs CTAB. Gastrointestinal: Soft and nontender. No distention. No abdominal bruits. No CVA tenderness. }Musculoskeletal: No lower extremity tenderness nor edema.  No joint effusions. Neurologic: patient's speech is slightly slurry. There is a right-sided facial droopthat does not involve the forehead. Right arm and leg areislightlyweak in tingly. Skin:  Skin is warm, dry and intact. No rash noted. Psychiatric: Mood and affect are normal. Speech and behavior are normal.  ____________________________________________   LABS (all labs ordered are listed, but only abnormal results are displayed)  Labs Reviewed  CBC - Abnormal; Notable for the  following:       Result Value   WBC 11.5 (*)    All other components within normal limits  DIFFERENTIAL - Abnormal; Notable for the following:    Neutro Abs 9.3 (*)    All other components within normal limits  COMPREHENSIVE METABOLIC PANEL - Abnormal; Notable for the following:    Glucose, Bld 159 (*)    All other components within normal limits  PROTIME-INR  APTT  TROPONIN I  ETHANOL  RAPID URINE DRUG SCREEN, HOSP PERFORMED  URINALYSIS, ROUTINE W REFLEX MICROSCOPIC  PREGNANCY, URINE  CBG MONITORING, ED   ____________________________________________  EKG  EKG read and interpreted by me shows normal sinus rhythm rate of 93 normal axis no acute ST-T wave changes ____________________________________________  RADIOLOGY  Ct Angio Head W  Or Wo Contrast  Result Date: 04/18/2017 CLINICAL DATA:  New onset right arm numbness and weakness right-sided facial paralysis at 11 a.m. today. Slurred speech. EXAM: CT ANGIOGRAPHY HEAD AND NECK TECHNIQUE: Multidetector CT imaging of the head and neck was performed using the standard protocol during bolus administration of intravenous contrast. Multiplanar CT image reconstructions and MIPs were obtained to evaluate the vascular anatomy. Carotid stenosis measurements (when applicable) are obtained utilizing NASCET criteria, using the distal internal carotid diameter as the denominator. CONTRAST:  75 mL Isovue 370 COMPARISON:  CT head without contrast from the same day. FINDINGS: CT HEAD FINDINGS Brain: Multiple left-sided MCA territory cortical infarcts are again seen. There is no hemorrhage. Ex vacuo dilation of the left lateral ventricle suggests the infarcts are likely remote. Vascular: No significant vascular calcifications are present. Skull: The calvarium is intact. No focal lytic or blastic lesions are present. Sinuses: The paranasal sinuses and mastoid air cells are clear. Orbits: The globes and orbits are unremarkable. Review of the MIP images  confirms the above findings CTA NECK FINDINGS Aortic arch: A 3 vessel arch configuration is present. There is no significant calcification or stenosis of the great vessel origins Right carotid system: The right common carotid artery is within normal limits. Bifurcation is unremarkable. Cervical right ICA is normal Left carotid system: Left common carotid artery is within normal limits. The carotid bifurcation is within normal limits. There is focal tapering and a filling defect approximately 3.5 cm from the bifurcation. This is compatible with a focal dissection and intraluminal or intramural thrombus. More distal cervical left ICA appears to be within normal limits Vertebral arteries: The vertebral arteries are codominant. There is no focal stenosis or vascular injury to either vertebral artery within the neck. Skeleton: Degenerative endplate changes are present at C5-6 and C6-7 with osseous foraminal narrowing at these levels. Vertebral body heights alignment are maintained. There straightening of the normal cervical lordosis. No focal lytic or blastic lesions are present. Extensive dental and periodontal disease is noted. Other neck: The soft tissues of the neck are unremarkable. Mildly prominent level 2 lymph nodes bilaterally appear reactive. Reactive type submandibular lymph nodes are noted as well. Upper chest: The lung apices demonstrates ground-glass attenuation suggesting edema or atelectasis. Review of the MIP images confirms the above findings CTA HEAD FINDINGS Anterior circulation: Atherosclerotic changes are present within the internal carotid artery's at the skullbase bilaterally. Luminal narrowing is more pronounced on the left, likely reflecting decreased profusion. The ICA terminus is opacified bilaterally, although smaller on the left. Left A1 and M1 segments are also smaller. The anterior communicating artery is patent. The MCA bifurcations are intact. There is asymmetric attenuation of left MCA  branch vessels. ACA and right MCA branch vessels demonstrate more mild distal segmental irregularity. Posterior circulation: The vertebral arteries are codominant. The PICA origins are visualized and normal. Basilar artery is normal. Both posterior cerebral arteries originate from the basilar tip. There is some attenuation of distal PCA branch vessels without a significant proximal stenosis or occlusion. Venous sinuses: The dural sinuses are patent. The right transverse sinus dominant. The straight sinus and deep cerebral veins are intact. Cortical veins are unremarkable. Anatomic variants: None Delayed phase: Left-sided infarcts are exaggerated on postcontrast imaging. Review of the MIP images confirms the above findings IMPRESSION: 1. Focal tapering and endoluminal or intramural filling defect in the left cervical internal carotid artery suggesting dissection and thrombus. 2. No other significant cervical vascular disease 3. Asymmetric attenuation of the intracranial  left internal carotid artery, left A1, left M1, and left MCA branch vessels suggesting decreased perfusion distal to the focal take brain and thrombus. 4. Scattered cortical infarcts involving the left MCA territory. Several of these appear remote. 5. More subtle cortical defects are seen best on the delayed images and likely represent acute areas of cortical infarct along the precentral gyrus. MRI would be useful further evaluation of acute infarct. These results were called by telephone at the time of interpretation on 04/18/2017 at 2:38 pm to Dr. Darnelle Catalan, who verbally acknowledged these results. Electronically Signed   By: Marin Roberts M.D.   On: 04/18/2017 14:42   Ct Angio Neck W Or Wo Contrast  Result Date: 04/18/2017 CLINICAL DATA:  New onset right arm numbness and weakness right-sided facial paralysis at 11 a.m. today. Slurred speech. EXAM: CT ANGIOGRAPHY HEAD AND NECK TECHNIQUE: Multidetector CT imaging of the head and neck was  performed using the standard protocol during bolus administration of intravenous contrast. Multiplanar CT image reconstructions and MIPs were obtained to evaluate the vascular anatomy. Carotid stenosis measurements (when applicable) are obtained utilizing NASCET criteria, using the distal internal carotid diameter as the denominator. CONTRAST:  75 mL Isovue 370 COMPARISON:  CT head without contrast from the same day. FINDINGS: CT HEAD FINDINGS Brain: Multiple left-sided MCA territory cortical infarcts are again seen. There is no hemorrhage. Ex vacuo dilation of the left lateral ventricle suggests the infarcts are likely remote. Vascular: No significant vascular calcifications are present. Skull: The calvarium is intact. No focal lytic or blastic lesions are present. Sinuses: The paranasal sinuses and mastoid air cells are clear. Orbits: The globes and orbits are unremarkable. Review of the MIP images confirms the above findings CTA NECK FINDINGS Aortic arch: A 3 vessel arch configuration is present. There is no significant calcification or stenosis of the great vessel origins Right carotid system: The right common carotid artery is within normal limits. Bifurcation is unremarkable. Cervical right ICA is normal Left carotid system: Left common carotid artery is within normal limits. The carotid bifurcation is within normal limits. There is focal tapering and a filling defect approximately 3.5 cm from the bifurcation. This is compatible with a focal dissection and intraluminal or intramural thrombus. More distal cervical left ICA appears to be within normal limits Vertebral arteries: The vertebral arteries are codominant. There is no focal stenosis or vascular injury to either vertebral artery within the neck. Skeleton: Degenerative endplate changes are present at C5-6 and C6-7 with osseous foraminal narrowing at these levels. Vertebral body heights alignment are maintained. There straightening of the normal cervical  lordosis. No focal lytic or blastic lesions are present. Extensive dental and periodontal disease is noted. Other neck: The soft tissues of the neck are unremarkable. Mildly prominent level 2 lymph nodes bilaterally appear reactive. Reactive type submandibular lymph nodes are noted as well. Upper chest: The lung apices demonstrates ground-glass attenuation suggesting edema or atelectasis. Review of the MIP images confirms the above findings CTA HEAD FINDINGS Anterior circulation: Atherosclerotic changes are present within the internal carotid artery's at the skullbase bilaterally. Luminal narrowing is more pronounced on the left, likely reflecting decreased profusion. The ICA terminus is opacified bilaterally, although smaller on the left. Left A1 and M1 segments are also smaller. The anterior communicating artery is patent. The MCA bifurcations are intact. There is asymmetric attenuation of left MCA branch vessels. ACA and right MCA branch vessels demonstrate more mild distal segmental irregularity. Posterior circulation: The vertebral arteries are codominant. The  PICA origins are visualized and normal. Basilar artery is normal. Both posterior cerebral arteries originate from the basilar tip. There is some attenuation of distal PCA branch vessels without a significant proximal stenosis or occlusion. Venous sinuses: The dural sinuses are patent. The right transverse sinus dominant. The straight sinus and deep cerebral veins are intact. Cortical veins are unremarkable. Anatomic variants: None Delayed phase: Left-sided infarcts are exaggerated on postcontrast imaging. Review of the MIP images confirms the above findings IMPRESSION: 1. Focal tapering and endoluminal or intramural filling defect in the left cervical internal carotid artery suggesting dissection and thrombus. 2. No other significant cervical vascular disease 3. Asymmetric attenuation of the intracranial left internal carotid artery, left A1, left M1, and  left MCA branch vessels suggesting decreased perfusion distal to the focal take brain and thrombus. 4. Scattered cortical infarcts involving the left MCA territory. Several of these appear remote. 5. More subtle cortical defects are seen best on the delayed images and likely represent acute areas of cortical infarct along the precentral gyrus. MRI would be useful further evaluation of acute infarct. These results were called by telephone at the time of interpretation on 04/18/2017 at 2:38 pm to Dr. Darnelle Catalan, who verbally acknowledged these results. Electronically Signed   By: Marin Roberts M.D.   On: 04/18/2017 14:42   Ct Head Code Stroke W/o Cm  Result Date: 04/18/2017 CLINICAL DATA:  Code stroke.  Slurred speech right-sided weakness EXAM: CT HEAD WITHOUT CONTRAST TECHNIQUE: Contiguous axial images were obtained from the base of the skull through the vertex without intravenous contrast. COMPARISON:  CT head 06/12/2011 FINDINGS: Brain: Chronic left MCA infarct which was not present previously. Chronic lacunar infarction in the left basal ganglia and putamen. Well-defined hypodensity in the left frontal and parietal cortex with volume loss and enlargement of the left lateral ventricle with mild shift to the left. Negative for acute infarct.  Negative for hemorrhage or mass lesion. Vascular: Negative for hyperdense vessel. Skull: Negative Sinuses/Orbits: Negative Other: None ASPECTS (Alberta Stroke Program Early CT Score) - Ganglionic level infarction (caudate, lentiform nuclei, internal capsule, insula, M1-M3 cortex): 7 - Supraganglionic infarction (M4-M6 cortex): 3 Total score (0-10 with 10 being normal): 10 IMPRESSION: 1. No acute intracranial abnormality. 2. ASPECTS is 10 3. Chronic left MCA infarct These results were called by telephone at the time of interpretation on 04/18/2017 at 1:00 pm to Dr. Minna Antis , who verbally acknowledged these results. Electronically Signed   By: Marlan Palau M.D.    On: 04/18/2017 13:00    ____________________________________________   PROCEDURES  Procedure(s) performed:  Procedures  Critical Care performed:  ____________________________________________   INITIAL IMPRESSION / ASSESSMENT AND PLAN / ED COURSE  Pertinent labs & imaging results that were available during my care of the patient were reviewed by me and considered in my medical decision making (see chart for details).        ____________________________________________   FINAL CLINICAL IMPRESSION(S) / ED DIAGNOSES  Final diagnoses:  Cerebral infarction, unspecified mechanism (HCC)  Carotid artery dissection (HCC)      NEW MEDICATIONS STARTED DURING THIS VISIT:  New Prescriptions   No medications on file     Note:  This document was prepared using Dragon voice recognition software and may include unintentional dictation errors.    Arnaldo Natal, MD 04/18/17 [phone]

## 2017-04-18 NOTE — ED Notes (Signed)
[facility].

## 2017-04-18 NOTE — ED Notes (Signed)
Report to paul rn at [facility].

## 2017-04-18 NOTE — ED Notes (Signed)
1610- [facility] arrived.  Computers down. Forms filled out and faxed to [facility].  Pt alert. Iv in place. Facial droop on the right side.

## 2017-04-18 NOTE — Consult Note (Signed)
Referring Physician: Darnelle Catalan    Chief Complaint: Right sided weakness  HPI: Latasha Lawrence is an 46 y.o. female who reports that for the past couple of days she has had areas of numbness.  Felt that this was related to her fibromyalgia.  This sensation was bilateral.  Went to bed otherwise at baseline last evening.  Awakened today with right sided weakness and numbness.  With no resolution of her symptoms patient presented for evaluation.  Initial NIHSS of 4.    Date last known well: Date: 04/18/2017 Time last known well: Time: 00:00 tPA Given: No: Outside time winodw  Past Medical History:  Diagnosis Date  . Abnormal weight gain   . Anxiety   . Chronic back pain   . Controlled substance agreement terminated 07/2014   failed UDS at [facility]  . Depression   . Fibromyalgia   . Hirsutism   . Hypertension   . Hypokalemia   . Neutrophilic leukocytosis   . Periodontal disease   . Stomach ulcer   . Vitamin D deficiency disease     Past Surgical History:  Procedure Laterality Date  . Arm Surgery    . CESAREAN SECTION      Family History  Problem Relation Age of Onset  . Arthritis Mother   . Cancer Mother        breast  . Mental illness Mother   . Thyroid disease Mother   . Hyperlipidemia Father   . Hypertension Father    Social History:  reports that she has been smoking.  She has been smoking about 0.50 packs per day. She has never used smokeless tobacco. She reports that she does not drink alcohol or use drugs. Has a past history of illicit drug abuse.    Allergies:  Allergies  Allergen Reactions  . Morphine And Related Itching  . Nsaids Other (See Comments)    Causes ulcers to bleed    Medications: I have reviewed the patient's current medications. Prior to Admission:  Prior to Admission medications   Medication Sig Start Date End Date Taking? Authorizing Provider  gabapentin (NEURONTIN) 100 MG capsule Take 100 mg by mouth at bedtime. Take 1 cap at  night along with 300mg  cap for total night dose of 1000mg . 05/23/13  Yes [provider]  gabapentin (NEURONTIN) 300 MG capsule Take 3 capsules by mouth at bedtime. 05/23/13  Yes [provider]  acetaminophen-codeine (TYLENOL #3) 300-30 MG tablet Take 1-2 tablets by mouth every 4 (four) hours as needed for moderate pain. Patient not taking: Reported on 04/18/2017 06/07/16   Kem Boroughs B, FNP  amoxicillin (AMOXIL) 500 MG tablet Take 1 tablet (500 mg total) by mouth 3 (three) times daily. Patient not taking: Reported on 04/18/2017 06/07/16   Kem Boroughs B, FNP  cyclobenzaprine (FLEXERIL) 10 MG tablet Take 1 tablet (10 mg total) by mouth 3 (three) times daily as needed for muscle spasms. Patient not taking: Reported on 04/18/2017 04/02/17   Cuthriell, Delorise Royals, PA-C  erythromycin base (E-MYCIN) 500 MG tablet Take 1 tablet (500 mg total) by mouth 4 (four) times daily. Patient not taking: Reported on 04/18/2017 03/23/16   Joni Reining, PA-C  fluticasone [facility]) 50 MCG/ACT nasal spray Place 1 spray into both nostrils 2 (two) times daily as needed for allergies or rhinitis. 05/26/15 05/25/16  Myrna Blazer, MD  HYDROcodone-acetaminophen (NORCO) 5-325 MG tablet Take 1 tablet by mouth every 6 (six) hours as needed for moderate pain. Patient not  taking: Reported on 04/18/2017 07/31/15   Evon Slack, PA-C  oxyCODONE-acetaminophen (ROXICET) 5-325 MG tablet Take 1 tablet by mouth every 6 (six) hours as needed for severe pain. Patient not taking: Reported on 04/18/2017 07/21/15   Sharman Cheek, MD  predniSONE (DELTASONE) 50 MG tablet Take 1 tablet (50 mg total) by mouth daily with breakfast. Patient not taking: Reported on 04/18/2017 04/02/17   Cuthriell, Delorise Royals, PA-C  venlafaxine (EFFEXOR) 75 MG tablet Take 75 mg by mouth 2 (two) times daily.    [provider]     ROS: History obtained from the patient  General ROS: negative for - chills, fatigue, fever,  night sweats, weight gain or weight loss Psychological ROS: negative for - behavioral disorder, hallucinations, memory difficulties, mood swings or suicidal ideation Ophthalmic ROS: negative for - blurry vision, double vision, eye pain or loss of vision ENT ROS: negative for - epistaxis, nasal discharge, oral lesions, sore throat, tinnitus or vertigo Allergy and Immunology ROS: negative for - hives or itchy/watery eyes Hematological and Lymphatic ROS: negative for - bleeding problems, bruising or swollen lymph nodes Endocrine ROS: negative for - galactorrhea, hair pattern changes, polydipsia/polyuria or temperature intolerance Respiratory ROS: negative for - cough, hemoptysis, shortness of breath or wheezing Cardiovascular ROS: negative for - chest pain, dyspnea on exertion, edema or irregular heartbeat Gastrointestinal ROS: negative for - abdominal pain, diarrhea, hematemesis, nausea/vomiting or stool incontinence Genito-Urinary ROS: negative for - dysuria, hematuria, incontinence or urinary frequency/urgency Musculoskeletal ROS: muscular pain Neurological ROS: as noted in HPI Dermatological ROS: negative for rash and skin lesion changes  Physical Examination: Blood pressure 134/79, pulse 83, temperature 98 F (36.7 C), resp. rate 15, height 5\' 6"  (1.676 m), weight 81.6 kg (180 lb), last menstrual period 03/26/2017, SpO2 96 %.  HEENT-  Normocephalic, no lesions, without obvious abnormality.  Normal external eye and conjunctiva.  Normal TM's bilaterally.  Normal auditory canals and external ears. Normal external nose, mucus membranes and septum.  Normal pharynx. Cardiovascular- S1, S2 normal, pulses palpable throughout   Lungs- chest clear, no wheezing, rales, normal symmetric air entry Abdomen- soft, non-tender; bowel sounds normal; no masses,  no organomegaly Extremities- no edema Lymph-no adenopathy palpable Musculoskeletal-no joint tenderness, deformity or swelling Skin-warm and dry, no  hyperpigmentation, vitiligo, or suspicious lesions  Neurological Examination   Mental Status: Alert, oriented, thought content appropriate.  Speech fluent without evidence of aphasia. + dysarthria.   Able to follow 3 step commands without difficulty. Cranial Nerves: II: Discs flat bilaterally; Visual fields grossly normal, pupils equal, round, reactive to light and accommodation III,IV, VI: ptosis not present, extra-ocular motions intact bilaterally V,VII: right facial droop, facial light touch sensation decreased on the right VIII: hearing normal bilaterally IX,X: gag reflex present XI: bilateral shoulder shrug XII: midline tongue extension Motor: Right : Upper extremity   5-/5    Left:     Upper extremity   5/5  Lower extremity   5-/5     Lower extremity   5/5 Increased tone in the RUE Sensory: Pinprick and light touch decreased on the RLE Deep Tendon Reflexes: 2+ and symmetric throughout Plantars: Right: mute   Left: mute Cerebellar: Normal finger-to-nose and normal heel-to-shin testing bilaterally Gait: not tested due to safety concerns    Laboratory Studies:  Basic Metabolic Panel:  Recent Labs Lab 04/18/17 1302  NA 137  K 3.7  CL 102  CO2 26  GLUCOSE 159*  BUN 6  CREATININE 0.86  CALCIUM 9.2  Liver Function Tests:  Recent Labs Lab 04/18/17 1302  AST 21  ALT 18  ALKPHOS 71  BILITOT 0.8  PROT 7.7  ALBUMIN 3.9   No results for input(s): LIPASE, AMYLASE in the last 168 hours. No results for input(s): AMMONIA in the last 168 hours.  CBC:  Recent Labs Lab 04/18/17 1302  WBC 11.5*  NEUTROABS 9.3*  HGB 13.9  HCT 41.6  MCV 86.6  PLT 311    Cardiac Enzymes:  Recent Labs Lab 04/18/17 1302  TROPONINI <0.03    BNP: Invalid input(s): POCBNP  CBG: No results for input(s): GLUCAP in the last 168 hours.  Microbiology: Results for orders placed or performed during the hospital encounter of 07/31/15  Wet prep, genital     Status: Abnormal    Collection Time: 07/31/15  9:39 PM  Result Value Ref Range Status   Yeast Wet Prep HPF POC NEGATIVE (A) NONE SEEN Final   Trich, Wet Prep NEGATIVE (A) NONE SEEN Final   Clue Cells Wet Prep HPF POC FEW (A) NONE SEEN Final   WBC, Wet Prep HPF POC FEW (A) NONE SEEN Final  Chlamydia/NGC rt PCR     Status: None   Collection Time: 07/31/15  9:39 PM  Result Value Ref Range Status   Specimen source GC/Chlam ENDOCERVICAL  Final   Chlamydia Tr NOT DETECTED NOT DETECTED Final   N gonorrhoeae NOT DETECTED NOT DETECTED Final    Comment: (NOTE) 100  This methodology has not been evaluated in pregnant women or in 200  patients with a history of hysterectomy. 300 400  This methodology will not be performed on patients less than 37  years of age.     Coagulation Studies:  Recent Labs  04/18/17 1302  LABPROT 13.4  INR 1.02    Urinalysis: No results for input(s): COLORURINE, LABSPEC, PHURINE, GLUCOSEU, HGBUR, BILIRUBINUR, KETONESUR, PROTEINUR, UROBILINOGEN, NITRITE, LEUKOCYTESUR in the last 168 hours.  Invalid input(s): APPERANCEUR  Lipid Panel:    Component Value Date/Time   CHOL 268 (H) 01/19/2014 1427   TRIG 340 (H) 01/19/2014 1427   HDL 33 (L) 01/19/2014 1427   VLDL 68 (H) 01/19/2014 1427   LDLCALC 167 (H) 01/19/2014 1427    HgbA1C:  Lab Results  Component Value Date   HGBA1C 5.4 01/21/2014    Urine Drug Screen:     Component Value Date/Time   LABOPIA POSITIVE 01/11/2012 0519   COCAINSCRNUR POSITIVE 01/11/2012 0519   LABBENZ NEGATIVE 01/11/2012 0519   AMPHETMU NEGATIVE 01/11/2012 0519   THCU NEGATIVE 01/11/2012 0519   LABBARB NEGATIVE 01/11/2012 0519    Alcohol Level:  Recent Labs Lab 04/18/17 1302  ETH <5    Other results: EKG: sinus rhythm at 93 bpm.  Imaging: Ct Angio Head W Or Wo Contrast  Result Date: 04/18/2017 CLINICAL DATA:  New onset right arm numbness and weakness right-sided facial paralysis at 11 a.m. today. Slurred speech. EXAM: CT ANGIOGRAPHY  HEAD AND NECK TECHNIQUE: Multidetector CT imaging of the head and neck was performed using the standard protocol during bolus administration of intravenous contrast. Multiplanar CT image reconstructions and MIPs were obtained to evaluate the vascular anatomy. Carotid stenosis measurements (when applicable) are obtained utilizing NASCET criteria, using the distal internal carotid diameter as the denominator. CONTRAST:  75 mL Isovue 370 COMPARISON:  CT head without contrast from the same day. FINDINGS: CT HEAD FINDINGS Brain: Multiple left-sided MCA territory cortical infarcts are again seen. There is no hemorrhage. Ex vacuo dilation of the  left lateral ventricle suggests the infarcts are likely remote. Vascular: No significant vascular calcifications are present. Skull: The calvarium is intact. No focal lytic or blastic lesions are present. Sinuses: The paranasal sinuses and mastoid air cells are clear. Orbits: The globes and orbits are unremarkable. Review of the MIP images confirms the above findings CTA NECK FINDINGS Aortic arch: A 3 vessel arch configuration is present. There is no significant calcification or stenosis of the great vessel origins Right carotid system: The right common carotid artery is within normal limits. Bifurcation is unremarkable. Cervical right ICA is normal Left carotid system: Left common carotid artery is within normal limits. The carotid bifurcation is within normal limits. There is focal tapering and a filling defect approximately 3.5 cm from the bifurcation. This is compatible with a focal dissection and intraluminal or intramural thrombus. More distal cervical left ICA appears to be within normal limits Vertebral arteries: The vertebral arteries are codominant. There is no focal stenosis or vascular injury to either vertebral artery within the neck. Skeleton: Degenerative endplate changes are present at C5-6 and C6-7 with osseous foraminal narrowing at these levels. Vertebral body  heights alignment are maintained. There straightening of the normal cervical lordosis. No focal lytic or blastic lesions are present. Extensive dental and periodontal disease is noted. Other neck: The soft tissues of the neck are unremarkable. Mildly prominent level 2 lymph nodes bilaterally appear reactive. Reactive type submandibular lymph nodes are noted as well. Upper chest: The lung apices demonstrates ground-glass attenuation suggesting edema or atelectasis. Review of the MIP images confirms the above findings CTA HEAD FINDINGS Anterior circulation: Atherosclerotic changes are present within the internal carotid artery's at the skullbase bilaterally. Luminal narrowing is more pronounced on the left, likely reflecting decreased profusion. The ICA terminus is opacified bilaterally, although smaller on the left. Left A1 and M1 segments are also smaller. The anterior communicating artery is patent. The MCA bifurcations are intact. There is asymmetric attenuation of left MCA branch vessels. ACA and right MCA branch vessels demonstrate more mild distal segmental irregularity. Posterior circulation: The vertebral arteries are codominant. The PICA origins are visualized and normal. Basilar artery is normal. Both posterior cerebral arteries originate from the basilar tip. There is some attenuation of distal PCA branch vessels without a significant proximal stenosis or occlusion. Venous sinuses: The dural sinuses are patent. The right transverse sinus dominant. The straight sinus and deep cerebral veins are intact. Cortical veins are unremarkable. Anatomic variants: None Delayed phase: Left-sided infarcts are exaggerated on postcontrast imaging. Review of the MIP images confirms the above findings IMPRESSION: 1. Focal tapering and endoluminal or intramural filling defect in the left cervical internal carotid artery suggesting dissection and thrombus. 2. No other significant cervical vascular disease 3. Asymmetric  attenuation of the intracranial left internal carotid artery, left A1, left M1, and left MCA branch vessels suggesting decreased perfusion distal to the focal take brain and thrombus. 4. Scattered cortical infarcts involving the left MCA territory. Several of these appear remote. 5. More subtle cortical defects are seen best on the delayed images and likely represent acute areas of cortical infarct along the precentral gyrus. MRI would be useful further evaluation of acute infarct. These results were called by telephone at the time of interpretation on 04/18/2017 at 2:38 pm to Dr. Darnelle Catalan, who verbally acknowledged these results. Electronically Signed   By: Marin Roberts M.D.   On: 04/18/2017 14:42   Ct Angio Neck W Or Wo Contrast  Result Date: 04/18/2017 CLINICAL DATA:  New onset right arm numbness and weakness right-sided facial paralysis at 11 a.m. today. Slurred speech. EXAM: CT ANGIOGRAPHY HEAD AND NECK TECHNIQUE: Multidetector CT imaging of the head and neck was performed using the standard protocol during bolus administration of intravenous contrast. Multiplanar CT image reconstructions and MIPs were obtained to evaluate the vascular anatomy. Carotid stenosis measurements (when applicable) are obtained utilizing NASCET criteria, using the distal internal carotid diameter as the denominator. CONTRAST:  75 mL Isovue 370 COMPARISON:  CT head without contrast from the same day. FINDINGS: CT HEAD FINDINGS Brain: Multiple left-sided MCA territory cortical infarcts are again seen. There is no hemorrhage. Ex vacuo dilation of the left lateral ventricle suggests the infarcts are likely remote. Vascular: No significant vascular calcifications are present. Skull: The calvarium is intact. No focal lytic or blastic lesions are present. Sinuses: The paranasal sinuses and mastoid air cells are clear. Orbits: The globes and orbits are unremarkable. Review of the MIP images confirms the above findings CTA NECK  FINDINGS Aortic arch: A 3 vessel arch configuration is present. There is no significant calcification or stenosis of the great vessel origins Right carotid system: The right common carotid artery is within normal limits. Bifurcation is unremarkable. Cervical right ICA is normal Left carotid system: Left common carotid artery is within normal limits. The carotid bifurcation is within normal limits. There is focal tapering and a filling defect approximately 3.5 cm from the bifurcation. This is compatible with a focal dissection and intraluminal or intramural thrombus. More distal cervical left ICA appears to be within normal limits Vertebral arteries: The vertebral arteries are codominant. There is no focal stenosis or vascular injury to either vertebral artery within the neck. Skeleton: Degenerative endplate changes are present at C5-6 and C6-7 with osseous foraminal narrowing at these levels. Vertebral body heights alignment are maintained. There straightening of the normal cervical lordosis. No focal lytic or blastic lesions are present. Extensive dental and periodontal disease is noted. Other neck: The soft tissues of the neck are unremarkable. Mildly prominent level 2 lymph nodes bilaterally appear reactive. Reactive type submandibular lymph nodes are noted as well. Upper chest: The lung apices demonstrates ground-glass attenuation suggesting edema or atelectasis. Review of the MIP images confirms the above findings CTA HEAD FINDINGS Anterior circulation: Atherosclerotic changes are present within the internal carotid artery's at the skullbase bilaterally. Luminal narrowing is more pronounced on the left, likely reflecting decreased profusion. The ICA terminus is opacified bilaterally, although smaller on the left. Left A1 and M1 segments are also smaller. The anterior communicating artery is patent. The MCA bifurcations are intact. There is asymmetric attenuation of left MCA branch vessels. ACA and right MCA  branch vessels demonstrate more mild distal segmental irregularity. Posterior circulation: The vertebral arteries are codominant. The PICA origins are visualized and normal. Basilar artery is normal. Both posterior cerebral arteries originate from the basilar tip. There is some attenuation of distal PCA branch vessels without a significant proximal stenosis or occlusion. Venous sinuses: The dural sinuses are patent. The right transverse sinus dominant. The straight sinus and deep cerebral veins are intact. Cortical veins are unremarkable. Anatomic variants: None Delayed phase: Left-sided infarcts are exaggerated on postcontrast imaging. Review of the MIP images confirms the above findings IMPRESSION: 1. Focal tapering and endoluminal or intramural filling defect in the left cervical internal carotid artery suggesting dissection and thrombus. 2. No other significant cervical vascular disease 3. Asymmetric attenuation of the intracranial left internal carotid artery, left A1, left M1, and left  MCA branch vessels suggesting decreased perfusion distal to the focal take brain and thrombus. 4. Scattered cortical infarcts involving the left MCA territory. Several of these appear remote. 5. More subtle cortical defects are seen best on the delayed images and likely represent acute areas of cortical infarct along the precentral gyrus. MRI would be useful further evaluation of acute infarct. These results were called by telephone at the time of interpretation on 04/18/2017 at 2:38 pm to Dr. Darnelle Catalan, who verbally acknowledged these results. Electronically Signed   By: Marin Roberts M.D.   On: 04/18/2017 14:42   Ct Head Code Stroke W/o Cm  Result Date: 04/18/2017 CLINICAL DATA:  Code stroke.  Slurred speech right-sided weakness EXAM: CT HEAD WITHOUT CONTRAST TECHNIQUE: Contiguous axial images were obtained from the base of the skull through the vertex without intravenous contrast. COMPARISON:  CT head 06/12/2011  FINDINGS: Brain: Chronic left MCA infarct which was not present previously. Chronic lacunar infarction in the left basal ganglia and putamen. Well-defined hypodensity in the left frontal and parietal cortex with volume loss and enlargement of the left lateral ventricle with mild shift to the left. Negative for acute infarct.  Negative for hemorrhage or mass lesion. Vascular: Negative for hyperdense vessel. Skull: Negative Sinuses/Orbits: Negative Other: None ASPECTS (Alberta Stroke Program Early CT Score) - Ganglionic level infarction (caudate, lentiform nuclei, internal capsule, insula, M1-M3 cortex): 7 - Supraganglionic infarction (M4-M6 cortex): 3 Total score (0-10 with 10 being normal): 10 IMPRESSION: 1. No acute intracranial abnormality. 2. ASPECTS is 10 3. Chronic left MCA infarct These results were called by telephone at the time of interpretation on 04/18/2017 at 1:00 pm to Dr. Minna Antis , who verbally acknowledged these results. Electronically Signed   By: Marlan Palau M.D.   On: 04/18/2017 13:00    Assessment: 46 y.o. female presenting with acute onset right hemiparesis.  Patient outside time window for tPA.  Head CT reviewed and shows an area of hypodensity in the left MCA distribution.  Patient reports no history of stroke.  CTA of head and neck performed that shows a left ICA dissection versus thrombus.   Patient will likely benefit from a setting where further testing and possible intervention if worsening can be offered.  Case discussed with Dr. Wilford Corner at [facility] who has accepted the patient in transfer.    Stroke Risk Factors - hypertension and smoking  Plan: 1. ASA 81mg  and Plavix 75mg  to be given now 2. Transfer to [facility]   Case discussed with Dr. Jory Ee, MD Neurology [phone] 04/18/2017, 4:20 PM

## 2017-04-18 NOTE — ED Triage Notes (Signed)
C/O feeling general weakness x 2-3 days.  Patient states she has history of fibromyalgia.  Went to bed last night at midnight, feeling at baseline, and awoke this morning at around 1030-1100 with c/o headache, right sided weakness and numbness and right facial droop.

## 2017-04-18 NOTE — ED Notes (Signed)
[facility] being transferred to [facility].  Computers down meds given for headache.  Unable to scan meds. Pt alert.

## 2017-04-18 NOTE — ED Notes (Addendum)
Pt awoke today at 11Am with right arm numbness and weakness, right sided facial paralysis present upon time of arrival. Slurred speech.  Taken to CT 1 by Lyla Sonarrie, RCharity fundraiser

## 2017-04-18 NOTE — ED Notes (Signed)
CODE STROKE CALLED TO [id number] 

## 2017-04-18 NOTE — Progress Notes (Signed)
   04/18/17 1310  Clinical Encounter Type  Visited With Health care provider;Other (Comment) (CH)  Visit Type Initial;ED (Code Stroke)  Referral From Nurse   CH responded to page for code stroke. CH arrived and another CH was in the emergency department. CH stated no family available and staff was with patient. CH will follow-up as needed. [facility]

## 2017-04-18 NOTE — ED Notes (Signed)
[facility] pick up patient during downtime unable to print necessary forms [facility]

## 2017-04-18 NOTE — ED Notes (Signed)
Per Dr. Darnelle CatalanMalinda pt to wait for CTA until blood work is back. CT notified.

## 2017-04-19 ENCOUNTER — Encounter (HOSPITAL_COMMUNITY): Payer: Self-pay | Admitting: Radiology

## 2017-04-19 ENCOUNTER — Inpatient Hospital Stay (HOSPITAL_COMMUNITY)

## 2017-04-19 DIAGNOSIS — I639 Cerebral infarction, unspecified: Secondary | ICD-10-CM

## 2017-04-19 HISTORY — PX: IR ANGIO VERTEBRAL SEL VERTEBRAL BILAT MOD SED: IMG5369

## 2017-04-19 HISTORY — PX: IR ANGIO INTRA EXTRACRAN SEL COM CAROTID INNOMINATE BILAT MOD SED: IMG5360

## 2017-04-19 LAB — LIPID PANEL
CHOLESTEROL: 240 mg/dL — AB (ref 0–200)
HDL: 27 mg/dL — ABNORMAL LOW (ref >40)
LDL Cholesterol: UNDETERMINED mg/dL (ref 0–99)
TRIGLYCERIDES: 414 mg/dL — AB (ref <150)
Total CHOL/HDL Ratio: 8.9 RATIO
VLDL: UNDETERMINED mg/dL (ref 0–40)

## 2017-04-19 LAB — HIV ANTIBODY (ROUTINE TESTING W REFLEX): HIV Screen 4th Generation wRfx: NONREACTIVE

## 2017-04-19 MED ORDER — FENTANYL CITRATE (PF) 100 MCG/2ML IJ SOLN
INTRAMUSCULAR | Status: AC | PRN
  Administered 2017-04-19 (×2): 12.5 ug via INTRAVENOUS
  Administered 2017-04-19: 25 ug via INTRAVENOUS

## 2017-04-19 MED ORDER — SODIUM CHLORIDE 0.9 % IV SOLN: INTRAVENOUS | Status: DC

## 2017-04-19 MED ORDER — MIDAZOLAM HCL 2 MG/2ML IJ SOLN
INTRAMUSCULAR | Status: AC
  Filled 2017-04-19: qty 2

## 2017-04-19 MED ORDER — LIDOCAINE HCL (PF) 1 % IJ SOLN
INTRAMUSCULAR | Status: AC
  Filled 2017-04-19: qty 30

## 2017-04-19 MED ORDER — ACETAMINOPHEN-CODEINE #3 300-30 MG PO TABS
1.0000 | ORAL_TABLET | Freq: Four times a day (QID) | ORAL | Status: DC | PRN
  Administered 2017-04-19 – 2017-04-20 (×2): 2 via ORAL
  Administered 2017-04-20: 1 via ORAL
  Filled 2017-04-19: qty 1
  Filled 2017-04-19: qty 2
  Filled 2017-04-19 (×2): qty 1

## 2017-04-19 MED ORDER — HEPARIN SODIUM (PORCINE) 1000 UNIT/ML IJ SOLN
INTRAMUSCULAR | Status: AC | PRN
Start: 2017-04-19 — End: 2017-04-19
  Administered 2017-04-19: 1000 [IU] via INTRAVENOUS

## 2017-04-19 MED ORDER — MIDAZOLAM HCL 2 MG/2ML IJ SOLN
INTRAMUSCULAR | Status: AC | PRN
  Administered 2017-04-19 (×2): 0.5 mg via INTRAVENOUS
  Administered 2017-04-19: 1 mg via INTRAVENOUS

## 2017-04-19 MED ORDER — FENTANYL CITRATE (PF) 100 MCG/2ML IJ SOLN
INTRAMUSCULAR | Status: AC
  Filled 2017-04-19: qty 2

## 2017-04-19 MED ORDER — ACETAMINOPHEN-CODEINE #3 300-30 MG PO TABS
2.0000 | ORAL_TABLET | Freq: Once | ORAL | Status: AC
  Administered 2017-04-19: 2 via ORAL
  Filled 2017-04-19: qty 2

## 2017-04-19 MED ORDER — IOPAMIDOL (ISOVUE-300) INJECTION 61%
INTRAVENOUS | Status: AC
  Filled 2017-04-19: qty 50

## 2017-04-19 MED ORDER — HEPARIN SODIUM (PORCINE) 1000 UNIT/ML IJ SOLN
INTRAMUSCULAR | Status: AC
  Filled 2017-04-19: qty 2

## 2017-04-19 MED ORDER — IOPAMIDOL (ISOVUE-300) INJECTION 61%
INTRAVENOUS | Status: AC
  Administered 2017-04-19: 75 mL
  Filled 2017-04-19: qty 150

## 2017-04-19 NOTE — Sedation Documentation (Signed)
Right groin sheath removed. 

## 2017-04-19 NOTE — Progress Notes (Signed)
Chief Complaint Patient was seen in consultation today for cerebral arteriogram at the request of Dr. Milon Dikes  Referring Physician(s): Dr. Milon Dikes  Supervising Physician: Julieanne Cotton  Patient Status: [facility] - In-pt  History of Present Illness: Latasha Lawrence is a 46 y.o. female transferred from [facility] with acute CVA. 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 Neurologically, she has improved since the event. NIR is asked to perform formal diagnostic cerebral arteriogram. Chart, imaging, meds, labs, allergies reviewed. Has been NPO this am NO family present at this time.  Past Medical History:  Diagnosis Date  . Abnormal weight gain   . Anxiety   . Chronic back pain   . Controlled substance agreement terminated 07/2014   failed UDS at [facility]  . Depression   . Fibromyalgia   . Hirsutism   . Hypertension   . Hypokalemia   . Neutrophilic leukocytosis   . Periodontal disease   . Stomach ulcer   . Vitamin D deficiency disease     Past Surgical History:  Procedure Laterality Date  . Arm Surgery    . CESAREAN SECTION      Allergies: Morphine and related and Nsaids  Medications: Prior to Admission medications   Medication Sig Start Date End Date Taking? Authorizing Provider  acetaminophen-codeine (TYLENOL #3) 300-30 MG tablet Take 1-2 tablets by mouth every 4 (four) hours as needed for moderate pain. Patient not taking: Reported on 04/18/2017 06/07/16   Kem Boroughs B, FNP  amoxicillin (AMOXIL) 500 MG tablet Take 1 tablet (500 mg total) by mouth 3 (three) times daily. Patient not taking: Reported on 04/18/2017 06/07/16   Kem Boroughs B, FNP  cyclobenzaprine (FLEXERIL) 10 MG tablet Take 1 tablet (10 mg total) by mouth 3 (three) times daily as needed for muscle spasms. Patient not taking: Reported on 04/18/2017 04/02/17   Cuthriell, Delorise Royals, PA-C  erythromycin base (E-MYCIN) 500 MG tablet Take 1 tablet  (500 mg total) by mouth 4 (four) times daily. Patient not taking: Reported on 04/18/2017 03/23/16   Joni Reining, PA-C  fluticasone [facility]) 50 MCG/ACT nasal spray Place 1 spray into both nostrils 2 (two) times daily as needed for allergies or rhinitis. 05/26/15 05/25/16  Myrna Blazer, MD  gabapentin (NEURONTIN) 100 MG capsule Take 100 mg by mouth at bedtime. Take 1 cap at night along with 300mg  cap for total night dose of 1000mg . 05/23/13   [provider]  gabapentin (NEURONTIN) 300 MG capsule Take 3 capsules by mouth at bedtime. 05/23/13   [provider]  HYDROcodone-acetaminophen (NORCO) 5-325 MG tablet Take 1 tablet by mouth every 6 (six) hours as needed for moderate pain. Patient not taking: Reported on 04/18/2017 07/31/15   Evon Slack, PA-C  oxyCODONE-acetaminophen (ROXICET) 5-325 MG tablet Take 1 tablet by mouth every 6 (six) hours as needed for severe pain. Patient not taking: Reported on 04/18/2017 07/21/15   Sharman Cheek, MD  predniSONE (DELTASONE) 50 MG tablet Take 1 tablet (50 mg total) by mouth daily with breakfast. Patient not taking: Reported on 04/18/2017 04/02/17   Cuthriell, Delorise Royals, PA-C  venlafaxine (EFFEXOR) 75 MG tablet Take 75 mg by mouth 2 (two) times daily.    [provider]     Family History  Problem Relation Age of Onset  . Arthritis Mother   . Cancer Mother        breast  . Mental illness Mother   . Thyroid disease Mother   .  Hyperlipidemia Father   . Hypertension Father     Social History   Social History  . Marital status: Married    Spouse name: N/A  . Number of children: N/A  . Years of education: N/A   Social History Main Topics  . Smoking status: Current Every Day Smoker    Packs/day: 0.50  . Smokeless tobacco: Never Used  . Alcohol use No  . Drug use: No  . Sexual activity: Not Asked   Other Topics Concern  . None   Social History Narrative  . None    Review of Systems: A 12 point ROS  discussed and pertinent positives are indicated in the HPI above.  All other systems are negative.  Review of Systems  Vital Signs: BP 108/74 (BP Location: Right Arm)   Pulse 72   Temp 98.4 F (36.9 C) (Oral)   Resp (!) 21   Ht 5\' 6"  (1.676 m)   Wt 203 lb 4.2 oz (92.2 kg)   LMP 03/26/2017   SpO2 99%   BMI 32.81 kg/m   Physical Exam  Constitutional: She is oriented to person, place, and time. She appears well-developed. No distress.  HENT:  Head: Normocephalic.  Mouth/Throat: Oropharynx is clear and moist.  Eyes: Pupils are equal, round, and reactive to light. EOM are normal.  Neck: Normal range of motion. No JVD present. No tracheal deviation present.  Cardiovascular: Normal rate, regular rhythm and normal heart sounds.   Pulmonary/Chest: Effort normal and breath sounds normal. No respiratory distress.  Neurological: She is alert and oriented to person, place, and time.  Still some mild facial asymmetry, tongue midline Fine motor intact, slightly slower on right    Mallampati Score:  MD Evaluation Airway: WNL Heart: WNL Abdomen: WNL Chest/ Lungs: WNL ASA  Classification: 4 Mallampati/Airway Score: One  Imaging: Ct Angio Head W Or Wo Contrast  Result Date: 04/18/2017 CLINICAL DATA:  New onset right arm numbness and weakness right-sided facial paralysis at 11 a.m. today. Slurred speech. EXAM: CT ANGIOGRAPHY HEAD AND NECK TECHNIQUE: Multidetector CT imaging of the head and neck was performed using the standard protocol during bolus administration of intravenous contrast. Multiplanar CT image reconstructions and MIPs were obtained to evaluate the vascular anatomy. Carotid stenosis measurements (when applicable) are obtained utilizing NASCET criteria, using the distal internal carotid diameter as the denominator. CONTRAST:  75 mL Isovue 370 COMPARISON:  CT head without contrast from the same day. FINDINGS: CT HEAD FINDINGS Brain: Multiple left-sided MCA territory cortical  infarcts are again seen. There is no hemorrhage. Ex vacuo dilation of the left lateral ventricle suggests the infarcts are likely remote. Vascular: No significant vascular calcifications are present. Skull: The calvarium is intact. No focal lytic or blastic lesions are present. Sinuses: The paranasal sinuses and mastoid air cells are clear. Orbits: The globes and orbits are unremarkable. Review of the MIP images confirms the above findings CTA NECK FINDINGS Aortic arch: A 3 vessel arch configuration is present. There is no significant calcification or stenosis of the great vessel origins Right carotid system: The right common carotid artery is within normal limits. Bifurcation is unremarkable. Cervical right ICA is normal Left carotid system: Left common carotid artery is within normal limits. The carotid bifurcation is within normal limits. There is focal tapering and a filling defect approximately 3.5 cm from the bifurcation. This is compatible with a focal dissection and intraluminal or intramural thrombus. More distal cervical left ICA appears to be within normal limits Vertebral arteries:  The vertebral arteries are codominant. There is no focal stenosis or vascular injury to either vertebral artery within the neck. Skeleton: Degenerative endplate changes are present at C5-6 and C6-7 with osseous foraminal narrowing at these levels. Vertebral body heights alignment are maintained. There straightening of the normal cervical lordosis. No focal lytic or blastic lesions are present. Extensive dental and periodontal disease is noted. Other neck: The soft tissues of the neck are unremarkable. Mildly prominent level 2 lymph nodes bilaterally appear reactive. Reactive type submandibular lymph nodes are noted as well. Upper chest: The lung apices demonstrates ground-glass attenuation suggesting edema or atelectasis. Review of the MIP images confirms the above findings CTA HEAD FINDINGS Anterior circulation:  Atherosclerotic changes are present within the internal carotid artery's at the skullbase bilaterally. Luminal narrowing is more pronounced on the left, likely reflecting decreased profusion. The ICA terminus is opacified bilaterally, although smaller on the left. Left A1 and M1 segments are also smaller. The anterior communicating artery is patent. The MCA bifurcations are intact. There is asymmetric attenuation of left MCA branch vessels. ACA and right MCA branch vessels demonstrate more mild distal segmental irregularity. Posterior circulation: The vertebral arteries are codominant. The PICA origins are visualized and normal. Basilar artery is normal. Both posterior cerebral arteries originate from the basilar tip. There is some attenuation of distal PCA branch vessels without a significant proximal stenosis or occlusion. Venous sinuses: The dural sinuses are patent. The right transverse sinus dominant. The straight sinus and deep cerebral veins are intact. Cortical veins are unremarkable. Anatomic variants: None Delayed phase: Left-sided infarcts are exaggerated on postcontrast imaging. Review of the MIP images confirms the above findings IMPRESSION: 1. Focal tapering and endoluminal or intramural filling defect in the left cervical internal carotid artery suggesting dissection and thrombus. 2. No other significant cervical vascular disease 3. Asymmetric attenuation of the intracranial left internal carotid artery, left A1, left M1, and left MCA branch vessels suggesting decreased perfusion distal to the focal take brain and thrombus. 4. Scattered cortical infarcts involving the left MCA territory. Several of these appear remote. 5. More subtle cortical defects are seen best on the delayed images and likely represent acute areas of cortical infarct along the precentral gyrus. MRI would be useful further evaluation of acute infarct. These results were called by telephone at the time of interpretation on 04/18/2017  at 2:38 pm to Dr. Darnelle Catalan, who verbally acknowledged these results. Electronically Signed   By: Marin Roberts M.D.   On: 04/18/2017 14:42   Dg Chest 2 View  Result Date: 04/18/2017 CLINICAL DATA:  46 year old female with stroke. Note chest complaints. Note EXAM: CHEST  2 VIEW COMPARISON:  None. FINDINGS: The heart size and mediastinal contours are within normal limits. Both lungs are clear. The visualized skeletal structures are unremarkable. IMPRESSION: No active cardiopulmonary disease. Electronically Signed   By: Elgie Collard M.D.   On: 04/18/2017 21:03   Ct Angio Neck W Or Wo Contrast  Result Date: 04/18/2017 CLINICAL DATA:  New onset right arm numbness and weakness right-sided facial paralysis at 11 a.m. today. Slurred speech. EXAM: CT ANGIOGRAPHY HEAD AND NECK TECHNIQUE: Multidetector CT imaging of the head and neck was performed using the standard protocol during bolus administration of intravenous contrast. Multiplanar CT image reconstructions and MIPs were obtained to evaluate the vascular anatomy. Carotid stenosis measurements (when applicable) are obtained utilizing NASCET criteria, using the distal internal carotid diameter as the denominator. CONTRAST:  75 mL Isovue 370 COMPARISON:  CT head without  contrast from the same day. FINDINGS: CT HEAD FINDINGS Brain: Multiple left-sided MCA territory cortical infarcts are again seen. There is no hemorrhage. Ex vacuo dilation of the left lateral ventricle suggests the infarcts are likely remote. Vascular: No significant vascular calcifications are present. Skull: The calvarium is intact. No focal lytic or blastic lesions are present. Sinuses: The paranasal sinuses and mastoid air cells are clear. Orbits: The globes and orbits are unremarkable. Review of the MIP images confirms the above findings CTA NECK FINDINGS Aortic arch: A 3 vessel arch configuration is present. There is no significant calcification or stenosis of the great vessel origins  Right carotid system: The right common carotid artery is within normal limits. Bifurcation is unremarkable. Cervical right ICA is normal Left carotid system: Left common carotid artery is within normal limits. The carotid bifurcation is within normal limits. There is focal tapering and a filling defect approximately 3.5 cm from the bifurcation. This is compatible with a focal dissection and intraluminal or intramural thrombus. More distal cervical left ICA appears to be within normal limits Vertebral arteries: The vertebral arteries are codominant. There is no focal stenosis or vascular injury to either vertebral artery within the neck. Skeleton: Degenerative endplate changes are present at C5-6 and C6-7 with osseous foraminal narrowing at these levels. Vertebral body heights alignment are maintained. There straightening of the normal cervical lordosis. No focal lytic or blastic lesions are present. Extensive dental and periodontal disease is noted. Other neck: The soft tissues of the neck are unremarkable. Mildly prominent level 2 lymph nodes bilaterally appear reactive. Reactive type submandibular lymph nodes are noted as well. Upper chest: The lung apices demonstrates ground-glass attenuation suggesting edema or atelectasis. Review of the MIP images confirms the above findings CTA HEAD FINDINGS Anterior circulation: Atherosclerotic changes are present within the internal carotid artery's at the skullbase bilaterally. Luminal narrowing is more pronounced on the left, likely reflecting decreased profusion. The ICA terminus is opacified bilaterally, although smaller on the left. Left A1 and M1 segments are also smaller. The anterior communicating artery is patent. The MCA bifurcations are intact. There is asymmetric attenuation of left MCA branch vessels. ACA and right MCA branch vessels demonstrate more mild distal segmental irregularity. Posterior circulation: The vertebral arteries are codominant. The PICA  origins are visualized and normal. Basilar artery is normal. Both posterior cerebral arteries originate from the basilar tip. There is some attenuation of distal PCA branch vessels without a significant proximal stenosis or occlusion. Venous sinuses: The dural sinuses are patent. The right transverse sinus dominant. The straight sinus and deep cerebral veins are intact. Cortical veins are unremarkable. Anatomic variants: None Delayed phase: Left-sided infarcts are exaggerated on postcontrast imaging. Review of the MIP images confirms the above findings IMPRESSION: 1. Focal tapering and endoluminal or intramural filling defect in the left cervical internal carotid artery suggesting dissection and thrombus. 2. No other significant cervical vascular disease 3. Asymmetric attenuation of the intracranial left internal carotid artery, left A1, left M1, and left MCA branch vessels suggesting decreased perfusion distal to the focal take brain and thrombus. 4. Scattered cortical infarcts involving the left MCA territory. Several of these appear remote. 5. More subtle cortical defects are seen best on the delayed images and likely represent acute areas of cortical infarct along the precentral gyrus. MRI would be useful further evaluation of acute infarct. These results were called by telephone at the time of interpretation on 04/18/2017 at 2:38 pm to Dr. Darnelle CatalanMALINDA, who verbally acknowledged these results. Electronically  Signed   By: Marin Roberts M.D.   On: 04/18/2017 14:42   Mr Brain Wo Contrast  Result Date: 04/18/2017 CLINICAL DATA:  Initial evaluation for acute right-sided numbness with weakness. , evaluate for stroke. EXAM: MRI HEAD WITHOUT CONTRAST TECHNIQUE: Multiplanar, multiecho pulse sequences of the brain and surrounding structures were obtained without intravenous contrast. COMPARISON:  Prior CTA from earlier the same day. FINDINGS: Brain: Cerebral volume within normal limits for age. Multifocal scattered  areas of encephalomalacia with gliosis are seen throughout the left frontal, parietal, and temporal regions, consistent with remote left MCA territory infarcts. Associated remote lacunar infarct present within the left basal ganglia. Associated chronic blood products about these areas of remote infarction. There are superimposed multifocal relatively small volume scattered areas of cortical and subcortical acute left MCA territory infarcts involving the left frontal, parietal, and temporal lobes. Area of greatest infarction present at the left parietal lobe (series 3, image 41). No significant mass effect. No associated hemorrhage, with areas of susceptibility artifact largely confined to areas of remote infarction. A few of these scattered areas of diffusion abnormality appear to be slightly more subacute in appearance, most evident at the left temporal lobe (series 3, image 22). No other areas of acute or subacute ischemia. Gray-white matter differentiation otherwise maintained. No mass lesion, midline shift or mass effect. No hydrocephalus. No extra-axial fluid collection. Major dural sinuses are grossly patent. Incidental note made of an empty sella. Vascular: Left ICA and MCA are diminutive, but maintain a normal intravascular flow voids, stable relative to prior CTA. Major intracranial vascular flow otherwise well maintained. Skull and upper cervical spine: Craniocervical junction within normal limits. Visualized upper cervical spine unremarkable. Bone marrow signal intensity within normal limits. No scalp soft tissue abnormality. Sinuses/Orbits: Globes and orbital soft tissues within normal limits. Paranasal sinuses are clear. No mastoid effusion. Inner ear structures normal. IMPRESSION: 1. Patchy multifocal acute to early subacute ischemic nonhemorrhagic cortical and subcortical left MCA territory infarcts as above, likely embolic from previously identified left ICA dissection. 2. Superimposed scattered  remote/chronic left MCA territory infarcts. Electronically Signed   By: Rise Mu M.D.   On: 04/18/2017 23:26   Ct Head Code Stroke W/o Cm  Result Date: 04/18/2017 CLINICAL DATA:  Code stroke.  Slurred speech right-sided weakness EXAM: CT HEAD WITHOUT CONTRAST TECHNIQUE: Contiguous axial images were obtained from the base of the skull through the vertex without intravenous contrast. COMPARISON:  CT head 06/12/2011 FINDINGS: Brain: Chronic left MCA infarct which was not present previously. Chronic lacunar infarction in the left basal ganglia and putamen. Well-defined hypodensity in the left frontal and parietal cortex with volume loss and enlargement of the left lateral ventricle with mild shift to the left. Negative for acute infarct.  Negative for hemorrhage or mass lesion. Vascular: Negative for hyperdense vessel. Skull: Negative Sinuses/Orbits: Negative Other: None ASPECTS (Alberta Stroke Program Early CT Score) - Ganglionic level infarction (caudate, lentiform nuclei, internal capsule, insula, M1-M3 cortex): 7 - Supraganglionic infarction (M4-M6 cortex): 3 Total score (0-10 with 10 being normal): 10 IMPRESSION: 1. No acute intracranial abnormality. 2. ASPECTS is 10 3. Chronic left MCA infarct These results were called by telephone at the time of interpretation on 04/18/2017 at 1:00 pm to Dr. Minna Antis , who verbally acknowledged these results. Electronically Signed   By: Marlan Palau M.D.   On: 04/18/2017 13:00    Labs:  CBC:  Recent Labs  04/18/17 1302 04/18/17 1909  WBC 11.5* 10.7*  HGB 13.9  13.7  HCT 41.6 41.5  PLT 311 263    COAGS:  Recent Labs  04/18/17 1302  INR 1.02  APTT 33    BMP:  Recent Labs  04/18/17 1302 04/18/17 1909  NA 137  --   K 3.7  --   CL 102  --   CO2 26  --   GLUCOSE 159*  --   BUN 6  --   CALCIUM 9.2  --   CREATININE 0.86 0.75  GFRNONAA >60 >60  GFRAA >60 >60    LIVER FUNCTION TESTS:  Recent Labs  04/18/17 1302    BILITOT 0.8  AST 21  ALT 18  ALKPHOS 71  PROT 7.7  ALBUMIN 3.9    TUMOR MARKERS: No results for input(s): AFPTM, CEA, CA199, CHROMGRNA in the last 8760 hours.  Assessment and Plan: CVA (L)ICA stenosis/thromus For cerebral angio Risks and Benefits discussed with the patient including, but not limited to bleeding, infection, vascular injury or contrast induced renal failure. All of the patient's questions were answered, patient is agreeable to proceed. Consent signed and in chart.   Thank you for this interesting consult.  I greatly enjoyed meeting JENISSA TYRELL and look forward to participating in their care.  A copy of this report was sent to the requesting provider on this date.  Electronically Signed: Brayton El, PA-C 04/19/2017, 11:03 AM   I spent a total of 25 minutes in face to face in clinical consultation, greater than 50% of which was counseling/coordinating care for cerebral angio

## 2017-04-19 NOTE — Procedures (Signed)
S/P 4 vessel cerebral arteriogram. Rt CFA approach. Findings. 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 No intimal flap noted. However vessel prox to this appears narrower. than distally. 2.Partial reflux of Lt MCA via Lt [place] from [place]. 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

## 2017-04-19 NOTE — Progress Notes (Signed)
*  PRELIMINARY RESULTS* Vascular Ultrasound Carotid Duplex (Doppler) has been completed.  Preliminary findings: Right mid to distal ICA velocities in the 60-79% range, however no plaque formation to suggest stenosis. Consider FMD as possibility.  Left ICA 1-39% stenosis, however could not visualize distal ICA due to shadowing and poor positioning.   Antegrade vertebral flow.   Farrel DemarkJill Eunice, RDMS, RVT  04/19/2017, 2:18 PM

## 2017-04-19 NOTE — Care Management Note (Signed)
Case Management Note  Patient Details  Name: Latasha Lawrence MRN: [id number] Date of Birth: 05/29/1971  Subjective/Objective:  From home with spouse, presents with new CVA (ischemic), [facility] has clot in left neck , for angio today, had MRI last pm.                   Action/Plan: NCM will follow for dc needs.   Expected Discharge Date:                  Expected Discharge Plan:     In-House Referral:     Discharge planning Services  CM Consult  Post Acute Care Choice:    Choice offered to:     DME Arranged:    DME Agency:     HH Arranged:    HH Agency:     Status of Service:  In process, will continue to follow  If discussed at Long Length of Stay Meetings, dates discussed:    Additional Comments:  Leone Havenaylor, Jozlyn Schatz [place], RN 04/19/2017, 12:50 PM

## 2017-04-19 NOTE — Progress Notes (Signed)
STROKE TEAM PROGRESS NOTE   HISTORY OF PRESENT ILLNESS (per record) HPI: Latasha Lawrence is a 46 y.o. female with a PMH of IV drug abuse, tobacco abuse (current smoker), hypertension, hyperlipidemia, fibromyalgia, and depression who presented to [facility] emergency department for evaluation of right-sided numbness and weakness.  She said this numbness and weakness have been going on in her arm leg and face for the past few days but it wasn't until yesterday that she noticed right facial weakness of her lower face.  Initially she attributed all these problems to her fibromyalgia and thought she was just having increased symptoms of her fibromyalgia but when this facial weakness is persistent and she also noted some gait instability, swerving to the right, she decided to come in for workup.  She was seen at [facility] by Dr. Thad Ranger, who gave her initial stroke scale of 4.  A noncontrast head CT showed multiple areas of hypodensities in the left MCA territory. CT of the head and neck was performed which showed a possible left internal carotid artery dissection versus thrombus.  She was transferred to [facility] for higher level of care, should she worsen or her symptoms fluctuate.  On the review of systems, patient denies any radicular weight loss, she denies any prior illnesses, fevers, chills, shortness of breath, chest pain, palpitations, abdominal pain, nausea, vomiting, easy bruising or bleeding. She denies any miscarriages or clotting in legs/lungs. Denies family history of bleeding or clotting disorders. No family history suggestive of connective tissue disease.  LKW: 48h prior to presentation tpa given?: no, OSW ICH Score: NA Modified Rankin Score: 0   SUBJECTIVE (INTERVAL HISTORY) She is alone in the room  And no one is at the bedside.  The patient is alert and oriented and answers all questions appropriately.  She denies any prior history of  strokes but CT scan clearly shows old left hemispheric small infarcts. She denies any history of known left carotid dissection but review of electronic medical records in care everywhere show a visit to [facility] in 2012 for left carotid dissection which was treated conservatively   OBJECTIVE Temp:  [98 F (36.7 C)-98.4 F (36.9 C)] 98.4 F (36.9 C) (07/12 0751) Pulse Rate:  [64-96] 72 (07/12 0415) Cardiac Rhythm: Normal sinus rhythm (07/12 0700) Resp:  [13-23] 21 (07/12 0415) BP: (108-152)/(56-81) 108/74 (07/12 0751) SpO2:  [96 %-99 %] 99 % (07/12 0415) Weight:  [81.6 kg (180 lb)-92.2 kg (203 lb 4.2 oz)] 92.2 kg (203 lb 4.2 oz) (07/11 1718)  CBC:  Recent Labs Lab 04/18/17 1302 04/18/17 1909  WBC 11.5* 10.7*  NEUTROABS 9.3*  --   HGB 13.9 13.7  HCT 41.6 41.5  MCV 86.6 89.1  PLT 311 263    Basic Metabolic Panel:  Recent Labs Lab 04/18/17 1302 04/18/17 1909  NA 137  --   K 3.7  --   CL 102  --   CO2 26  --   GLUCOSE 159*  --   BUN 6  --   CREATININE 0.86 0.75  CALCIUM 9.2  --     Lipid Panel:    Component Value Date/Time   CHOL 240 (H) 04/19/2017 0718   CHOL 268 (H) 01/19/2014 1427   TRIG 414 (H) 04/19/2017 0718   TRIG 340 (H) 01/19/2014 1427   HDL 27 (L) 04/19/2017 0718   HDL 33 (L) 01/19/2014 1427   CHOLHDL 8.9 04/19/2017 0718   VLDL UNABLE TO CALCULATE  IF TRIGLYCERIDE OVER 400 mg/dL 16/07/9603 5409   VLDL 68 (H) 01/19/2014 1427   LDLCALC UNABLE TO CALCULATE IF TRIGLYCERIDE OVER 400 mg/dL 81/19/1478 2956   LDLCALC 167 (H) 01/19/2014 1427   HgbA1c:  Lab Results  Component Value Date   HGBA1C 5.4 01/21/2014   Urine Drug Screen:    Component Value Date/Time   LABOPIA POSITIVE 01/11/2012 0519   COCAINSCRNUR POSITIVE 01/11/2012 0519   LABBENZ NEGATIVE 01/11/2012 0519   AMPHETMU NEGATIVE 01/11/2012 0519   THCU NEGATIVE 01/11/2012 0519   LABBARB NEGATIVE 01/11/2012 0519    Alcohol Level     Component Value Date/Time   ETH <5 04/18/2017 1302     IMAGING  Ct Angio Head W and Wo Contrast 04/18/2017 IMPRESSION: 1. Focal tapering and endoluminal or intramural filling defect in the left cervical internal carotid artery suggesting dissection and thrombus. 2. No other significant cervical vascular disease 3. Asymmetric attenuation of the intracranial left internal carotid artery, left A1, left M1, and left MCA branch vessels suggesting decreased perfusion distal to the focal take brain and thrombus. 4. Scattered cortical infarcts involving the left MCA territory. Several of these appear remote. 5. More subtle cortical defects are seen best on the delayed images and likely represent acute areas of cortical infarct along the precentral gyrus. MRI would be useful further evaluation of acute infarct.   Dg Chest 2 View 04/18/2017 IMPRESSION: No active cardiopulmonary disease.  Mr Brain Wo Contrast 04/18/2017 IMPRESSION: 1. Patchy multifocal acute to early subacute ischemic nonhemorrhagic cortical and subcortical left MCA territory infarcts as above, likely embolic from previously identified left ICA dissection. 2. Superimposed scattered remote/chronic left MCA territory infarcts.   Ct Head Code Stroke W/o Cm 04/18/2017 IMPRESSION: 1. No acute intracranial abnormality. 2. ASPECTS is 10 3. Chronic left MCA infarct    PHYSICAL EXAM Pleasant middle aged Caucasian lady not in distress. . Afebrile. Head is nontraumatic. Neck is supple without bruit.    Cardiac exam no murmur or gallop. Lungs are clear to auscultation. Distal pulses are well felt. Neurological Exam ;  Awake  Alert oriented x 3. Mild dysarthric speech with occasional word finding difficulty. Good naming, repetition and comprehension.Marland [place] movements full without nystagmus.fundi were not visualized. Vision acuity and fields appear normal. Hearing is normal. Palatal movements are normal. Face asymmetric with right lower facial weakness.. Tongue midline. Normal strength, tone, reflexes and  coordination. Normal sensation. Gait deferred.  ASSESSMENT/PLAN Latasha Lawrence is a 46 y.o. female with history of IV drug abuse, tobacco abuse (current smoker), hypertension, hyperlipidemia, fibromyalgia, and depression who presented to [facility] emergency department for evaluation of right-sided numbness and weakness. She did not receive IV t-PA due to arriving outside of the treatment window.   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 Patchy multifocal acute to early subacute ischemic nonhemorrhagic cortical and subcortical left MCA territory infarcts as above, likely embolic from previously identified left ICA dissection. 2. Superimposed scattered remote/chronic left MCA territory infarcts.   MRA head: not ordered  Carotid Doppler   pending  2D Echo  pending   LDL 167   HgbA1c 5.4  Heparin for VTE prophylaxis  Diet NPO time specified Except for: Other (See Comments)  No antithrombotic prior to admission, now on aspirin 81  mg daily  Patient counseled to be compliant with her antithrombotic medications  Ongoing aggressive stroke risk factor management  Therapy recommendations:   pending  Disposition:  pending  Hypertension  Stable  Permissive hypertension (OK if < 220/120) but gradually normalize in 5-7 days  Long-term BP goal normotensive  Hyperlipidemia  Home meds: none  LDL 167, goal < 70  Add atorvastatin 80mg  PO daily  Continue statin at discharge  Other Stroke Risk Factors  Cigarette smoker, advised to stop smoking  Obesity, Body mass index is 32.81 kg/m., recommend weight loss, diet and exercise as appropriate   Other Active Problems  None  Hospital day # 1  I have personally examined this patient,  reviewed notes, independently viewed imaging studies, participated in medical decision making and plan of care.ROS completed by me personally and pertinent positives fully documented  I have made any additions or clarifications directly to the above note.  She has presented with dysarthria and right facial droop due to small left hemispheric infarcts likely due to symptomatic left carotid stenosis with suspect underlying dissection. She had a similar presentation in 2012 and [place] and was found to have small cervical carotid dissection at that time. Recommend dual antiplatelet therapy of aspirin and Plavix and check diagnostic cerebral catheter angiogram today. She made elective left carotid revascularization with angioplasty and stenting early next week. Discussed with patient and Dr. Corliss Skainseveshwar. Greater than 50% time during this 35 minute visit was spent on counseling and coordination of care about her left carotid stenosis, embolic stroke, carotid dissection and answering questions Delia HeadyPramod Sethi, MD Medical Director [facility] Pager: [phone] 04/19/2017 2:57 PM   To contact Stroke Continuity provider, please refer to [email or web address]. After hours, contact General Neurology

## 2017-04-19 NOTE — Evaluation (Signed)
Speech Language Pathology Evaluation Patient Details Name: Latasha Lawrence MRN: [id number] DOB: Mar 05, 1971 Today's Date: 04/19/2017 Time: 1478-2956 SLP Time Calculation (min) (ACUTE ONLY): 20 min  Problem List:  Patient Active Problem List   Diagnosis Date Noted  . Acute ischemic stroke (HCC) 04/18/2017  . Hypertension   . Fibromyalgia   . Vitamin D deficiency disease   . Hirsutism   . Hypokalemia   . Neutrophilic leukocytosis   . Periodontal disease   . Abnormal weight gain   . Chronic back pain    Past Medical History:  Past Medical History:  Diagnosis Date  . Abnormal weight gain   . Anxiety   . Chronic back pain   . Controlled substance agreement terminated 07/2014   failed UDS at [facility]  . Depression   . Fibromyalgia   . Hirsutism   . Hypertension   . Hypokalemia   . Neutrophilic leukocytosis   . Periodontal disease   . Stomach ulcer   . Vitamin D deficiency disease    Past Surgical History:  Past Surgical History:  Procedure Laterality Date  . Arm Surgery    . CESAREAN SECTION     HPI:  46yo female admitted 04/18/17 with right side numbness and weakness. PMH significant for HTN, fibromyalgia, depression. MRI revealed patchy multifocal (sub)acute ischemic nonhemorrhagic (sub)cortical Left MCA infarcts. Com/cog evaluation ordered.   Assessment / Plan / Recommendation Clinical Impression  The Montreal Cognitive Assessment (MoCA) was administered. Pt scored 23/30 (n=26+/30) indicating mild cognitive impairment for pt age and level of education (college graduate). Additionally, pt was able to engage in conversation without evident receptive or expressive deficits. Speech is fully intelligible despite mild right orofacial weakness, which pt reports is improving.   Pt was encouraged to notify MD if difficulties are noted upon return to work and normal routine. Home health or outpatient therapy may be beneficial. No further ST intervention is  recommended at this venue. Please reconsult if needs arise.     SLP Assessment  SLP Recommendation/Assessment: All further Speech Lanaguage Pathology  needs can be addressed in the next venue of care SLP Visit Diagnosis: Attention and concentration deficit;Cognitive communication deficit (R41.841) Attention and concentration deficit following: Cerebral infarction    Follow Up Recommendations  Outpatient SLP;Home health SLP (if needs arise upon return to normal activities)       SLP Evaluation Cognition  Overall Cognitive Status: Impaired/Different from baseline Arousal/Alertness: Awake/alert Orientation Level: Oriented to person;Oriented to place;Disoriented to time;Oriented to situation (stated year as 2012, Day as Wednesday) Attention: Focused;Sustained (no difficulty on attention subtests of MoCA) Focused Attention: Appears intact Sustained Attention: Appears intact Memory: Appears intact (good immediate recall of unrelated words, immediate and delayed - recalled 4/5 after 5 minutes) Awareness: Appears intact Problem Solving: Impaired Problem Solving Impairment: Verbal basic (slight difficulty with thought organization task on MoCA (named 9 animals in 60 seconds), difficulty with calculations (determining what would make up $13).) Executive Function: Reasoning (no difficulty exhibited on MoCA) Reasoning: Appears intact Safety/Judgment: Appears intact       Comprehension  Auditory Comprehension Overall Auditory Comprehension: Appears within functional limits for tasks assessed Visual Recognition/Discrimination Discrimination: Within Function Limits (accurate on visuoperception subtest of MoCA) Reading Comprehension Reading Status: Not tested    Expression Expression Primary Mode of Expression: Verbal Verbal Expression Overall Verbal Expression: Appears within functional limits for tasks assessed Written Expression Dominant Hand: Left Written Expression: Not tested   Oral  / Motor  Oral Motor/Sensory Function Overall  Oral Motor/Sensory Function: Mild impairment (slight right orofacial weakness) Motor Speech Overall Motor Speech: Appears within functional limits for tasks assessed (Pt reports dysarthric speech has improved) Articulation: Within functional limitis Intelligibility: Intelligible   GO                   Celia B. YaleBueche, [facility] Medical GroupMSP, CCC-SLP [phone]  Leigh AuroraBueche, Celia Brown 04/19/2017, 10:48 AM

## 2017-04-19 NOTE — Progress Notes (Signed)
PT Cancellation Note  Patient Details Name: Denice BorsRobin J Leckey MRN: [id number] DOB: Oct 21, 1970   Cancelled Treatment:    Reason Eval/Treat Not Completed: Medical issues which prohibited therapy. Pt on bedrest after procedure until late this afternoon. Will see tomorrow.   Angelina OkCary W Maycok 04/19/2017, 2:36 PM [employer] CorporationCary Kenny Rea PT [phone]

## 2017-04-20 ENCOUNTER — Other Ambulatory Visit (HOSPITAL_COMMUNITY)

## 2017-04-20 ENCOUNTER — Inpatient Hospital Stay (HOSPITAL_COMMUNITY)

## 2017-04-20 ENCOUNTER — Encounter: Payer: Self-pay | Admitting: Emergency Medicine

## 2017-04-20 ENCOUNTER — Emergency Department
Admission: EM | Admit: 2017-04-20 | Discharge: 2017-04-21 | Discharge status: Against Medical Advice | Attending: Emergency Medicine | Admitting: Emergency Medicine

## 2017-04-20 DIAGNOSIS — I340 Nonrheumatic mitral (valve) insufficiency: Secondary | ICD-10-CM

## 2017-04-20 DIAGNOSIS — R319 Hematuria, unspecified: Secondary | ICD-10-CM | POA: Insufficient documentation

## 2017-04-20 DIAGNOSIS — Z8673 Personal history of transient ischemic attack (TIA), and cerebral infarction without residual deficits: Secondary | ICD-10-CM | POA: Insufficient documentation

## 2017-04-20 DIAGNOSIS — Z7982 Long term (current) use of aspirin: Secondary | ICD-10-CM | POA: Insufficient documentation

## 2017-04-20 DIAGNOSIS — Z87891 Personal history of nicotine dependence: Secondary | ICD-10-CM | POA: Insufficient documentation

## 2017-04-20 DIAGNOSIS — Z79899 Other long term (current) drug therapy: Secondary | ICD-10-CM | POA: Insufficient documentation

## 2017-04-20 DIAGNOSIS — I773 Arterial fibromuscular dysplasia: Secondary | ICD-10-CM | POA: Insufficient documentation

## 2017-04-20 DIAGNOSIS — I10 Essential (primary) hypertension: Secondary | ICD-10-CM | POA: Insufficient documentation

## 2017-04-20 LAB — BASIC METABOLIC PANEL
Anion gap: 8 (ref 5–15)
CALCIUM: 9.2 mg/dL (ref 8.9–10.3)
CO2: 24 mmol/L (ref 22–32)
Chloride: 108 mmol/L (ref 101–111)
Creatinine, Ser: 0.71 mg/dL (ref 0.44–1.00)
GFR calc Af Amer: >60 mL/min (ref >60)
GLUCOSE: 103 mg/dL — AB (ref 65–99)
Potassium: 3.5 mmol/L (ref 3.5–5.1)
Sodium: 140 mmol/L (ref 135–145)

## 2017-04-20 LAB — CBC
HCT: 42.8 % (ref 35.0–47.0)
Hemoglobin: 14.6 g/dL (ref 12.0–16.0)
MCH: 29.4 pg (ref 26.0–34.0)
MCHC: 34.1 g/dL (ref 32.0–36.0)
MCV: 86 fL (ref 80.0–100.0)
PLATELETS: 304 10*3/uL (ref 150–440)
RBC: 4.97 MIL/uL (ref 3.80–5.20)
RDW: 13.9 % (ref 11.5–14.5)
WBC: 9.5 10*3/uL (ref 3.6–11.0)

## 2017-04-20 LAB — VAS US CAROTID
LCCADDIAS: -18 cm/s
LCCAPSYS: -87 cm/s
LEFT ECA DIAS: -27 cm/s
LEFT VERTEBRAL DIAS: 19 cm/s
LICADDIAS: -32 cm/s
LICADSYS: -132 cm/s
LICAPDIAS: -19 cm/s
LICAPSYS: -79 cm/s
Left CCA dist sys: -76 cm/s
Left CCA prox dias: -23 cm/s
RIGHT ECA DIAS: -28 cm/s
RIGHT VERTEBRAL DIAS: 15 cm/s
Right CCA prox dias: 24 cm/s
Right CCA prox sys: 85 cm/s
Right cca dist sys: -159 cm/s

## 2017-04-20 LAB — URINALYSIS, COMPLETE (UACMP) WITH MICROSCOPIC
Bacteria, UA: NONE SEEN
Bilirubin Urine: NEGATIVE
Glucose, UA: NEGATIVE mg/dL
Ketones, ur: NEGATIVE mg/dL
Leukocytes, UA: NEGATIVE
Nitrite: NEGATIVE
Protein, ur: NEGATIVE mg/dL
Specific Gravity, Urine: 1.01 (ref 1.005–1.030)
pH: 6 (ref 5.0–8.0)

## 2017-04-20 LAB — ECHOCARDIOGRAM COMPLETE
Height: 66 in
WEIGHTICAEL: 3252.23 [oz_av]

## 2017-04-20 LAB — HEMOGLOBIN A1C
Hgb A1c MFr Bld: 5.5 % (ref 4.8–5.6)
MEAN PLASMA GLUCOSE: 111 mg/dL

## 2017-04-20 MED ORDER — FAMOTIDINE 20 MG PO TABS
10.0000 mg | ORAL_TABLET | Freq: Every day | ORAL | Status: DC
  Administered 2017-04-20: 10 mg via ORAL
  Filled 2017-04-20: qty 1

## 2017-04-20 MED ORDER — ASPIRIN EC 325 MG PO TBEC
325.0000 mg | DELAYED_RELEASE_TABLET | Freq: Every day | ORAL | Status: DC
  Administered 2017-04-20: 325 mg via ORAL
  Filled 2017-04-20: qty 1

## 2017-04-20 MED ORDER — CLOPIDOGREL BISULFATE 75 MG PO TABS
75.0000 mg | ORAL_TABLET | Freq: Every day | ORAL | Status: DC
  Administered 2017-04-20: 75 mg via ORAL
  Filled 2017-04-20: qty 1

## 2017-04-20 MED ORDER — ATORVASTATIN CALCIUM 80 MG PO TABS: 80.0000 mg | ORAL_TABLET | Freq: Every day | ORAL | 0 refills | Status: DC

## 2017-04-20 MED ORDER — ASPIRIN 325 MG PO TABS: 325.0000 mg | ORAL_TABLET | Freq: Every day | ORAL | Status: DC

## 2017-04-20 MED ORDER — ASPIRIN 325 MG PO TBEC: 325.0000 mg | DELAYED_RELEASE_TABLET | Freq: Every day | ORAL | 0 refills | Status: DC

## 2017-04-20 MED ORDER — CLOPIDOGREL BISULFATE 75 MG PO TABS: 75.0000 mg | ORAL_TABLET | Freq: Every day | ORAL | 0 refills | Status: DC

## 2017-04-20 MED ORDER — FAMOTIDINE 10 MG PO TABS: 40.0000 mg | ORAL_TABLET | Freq: Every day | ORAL | 0 refills | Status: DC

## 2017-04-20 NOTE — ED Notes (Signed)
Pt's spouse up to desk in order to ask about wait time. Informed by this RN that it should not be long. Pt and spouse walked outside at this time.

## 2017-04-20 NOTE — Evaluation (Signed)
Physical Therapy Evaluation Patient Details Name: Latasha BorsRobin J Lawrence MRN: [id number] DOB: 1971/02/17 Today's Date: 04/20/2017   History of Present Illness  Pt adm from [place] with rt sided weakness and numbness. 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 PMH - IV drug abuse, tobacco abuse (current smoker), htn, fibromyalgia, and depression   Clinical Impression  Pt doing well with mobility and no further PT needed.  Ready for dc from PT standpoint.      Follow Up Recommendations No PT follow up    Equipment Recommendations  None recommended by PT    Recommendations for Other Services       Precautions / Restrictions Precautions Precautions: None Restrictions Weight Bearing Restrictions: No      Mobility  Bed Mobility Overal bed mobility: Independent                Transfers Overall transfer level: Independent                  Ambulation/Gait Ambulation/Gait assistance: Independent Ambulation Distance (Feet): 500 Feet Assistive device: None Gait Pattern/deviations: WFL(Within Functional Limits)   Gait velocity interpretation: at or above normal speed for age/gender General Gait Details: Steady gait without any signs of difficulty. Pt performed speed changes and turns without difficulty.  Stairs            Wheelchair Mobility    Modified Rankin (Stroke Patients Only) Modified Rankin (Stroke Patients Only) Pre-Morbid Rankin Score: No symptoms Modified Rankin: No significant disability     Balance Overall balance assessment: Independent (single leg stance, [place], picking up objects from floor)                                           Pertinent Vitals/Pain Pain Assessment: No/denies pain    Home Living Family/patient expects to be discharged to:: Private residence Living Arrangements:  Spouse/significant other Available Help at Discharge: Family (husband is w/c bound) Type of Home: House Home Access: Ramped entrance     Home Layout: One level Home Equipment: None      Prior Function Level of Independence: Independent         Comments: Works as Clinical biochemistcook     Hand Dominance   Dominant Hand: Left    Extremity/Trunk Assessment   Upper Extremity Assessment Upper Extremity Assessment: Defer to OT evaluation    Lower Extremity Assessment Lower Extremity Assessment: Overall WFL for tasks assessed       Communication   Communication: No difficulties  Cognition Arousal/Alertness: Awake/alert Behavior During Therapy: WFL for tasks assessed/performed Overall Cognitive Status: Impaired/Different from baseline                                 General Comments: See SLP assessement      General Comments      Exercises     Assessment/Plan    PT Assessment Patent does not need any further PT services  PT Problem List         PT Treatment Interventions      PT Goals (Current goals can be found in the Care Plan section)  Acute Rehab PT Goals PT Goal Formulation: All assessment and education complete, DC therapy  Frequency     Barriers to discharge        Co-evaluation               AM-PAC PT "6 Clicks" Daily Activity  Outcome Measure Difficulty turning over in bed (including adjusting bedclothes, sheets and blankets)?: None Difficulty moving from lying on back to sitting on the side of the bed? : None Difficulty sitting down on and standing up from a chair with arms (e.g., wheelchair, bedside commode, etc,.)?: None Help needed moving to and from a bed to chair (including a wheelchair)?: None Help needed walking in hospital room?: None Help needed climbing 3-5 steps with a railing? : None 6 Click Score: 24    End of Session   Activity Tolerance: Patient tolerated treatment well Patient left: in bed;with call bell/phone  within reach Nurse Communication: Mobility status PT Visit Diagnosis: Unsteadiness on feet (R26.81)    Time: 1610-9604 PT Time Calculation (min) (ACUTE ONLY): 8 min   Charges:   PT Evaluation $PT Eval Low Complexity: 1 Procedure     PT G CodesMarland Kitchen        [facility] PT [phone]   Angelina Ok [facility] 04/20/2017, 10:59 AM

## 2017-04-20 NOTE — Progress Notes (Signed)
Patient being discharged home. Patient given discharge instructions. Patient educated on new medication. Patient instructed to make follow up appts. Patient verbalized understanding. Patient being discharged home with husband.

## 2017-04-20 NOTE — Progress Notes (Signed)
STROKE TEAM PROGRESS NOTE   HISTORY OF PRESENT ILLNESS (per record) HPI: Latasha BorsRobin J Lawrence is a 46 y.o. female with a PMH of IV drug abuse, tobacco abuse (current smoker), hypertension, hyperlipidemia, fibromyalgia, and depression who presented to [facility] emergency department for evaluation of right-sided numbness and weakness.  She said this numbness and weakness have been going on in her arm leg and face for the past few days but it wasn't until yesterday that she noticed right facial weakness of her lower face.  Initially she attributed all these problems to her fibromyalgia and thought she was just having increased symptoms of her fibromyalgia but when this facial weakness is persistent and she also noted some gait instability, swerving to the right, she decided to come in for workup.  She was seen at [facility] by Dr. Thad Rangereynolds, who gave her initial stroke scale of 4.  A noncontrast head CT showed multiple areas of hypodensities in the left MCA territory. CT of the head and neck was performed which showed a possible left internal carotid artery dissection versus thrombus.  She was transferred to [facility] for higher level of care, should she worsen or her symptoms fluctuate.  On the review of systems, patient denies any radicular weight loss, she denies any prior illnesses, fevers, chills, shortness of breath, chest pain, palpitations, abdominal pain, nausea, vomiting, easy bruising or bleeding. She denies any miscarriages or clotting in legs/lungs. Denies family history of bleeding or clotting disorders. No family history suggestive of connective tissue disease.  LKW: 48h prior to presentation tpa given?: no, OSW ICH Score: NA Modified Rankin Score: 0   SUBJECTIVE (INTERVAL HISTORY) She is alone in the room  .  The patient is alert and oriented and answers all questions appropriately.  Her dysarthria is better blood pressure well controlled.  Catheter angiogram shows severe focal stenosis of left internal carotid artery at C1 but no definite intimal flap noted. Mild fibromuscular dysplasia changes in the mid one third [place] of the left ICA  OBJECTIVE Temp:  [98.2 F (36.8 C)-98.5 F (36.9 C)] 98.5 F (36.9 C) (07/13 1249) Pulse Rate:  [50-64] 64 (07/13 0700) Cardiac Rhythm: Normal sinus rhythm (07/13 0836) Resp:  [14-24] 20 (07/13 0700) BP: (95-160)/(58-88) 128/81 (07/13 1249) SpO2:  [93 %-99 %] 97 % (07/13 0700)  CBC:   Recent Labs Lab 04/18/17 1302 04/18/17 1909  WBC 11.5* 10.7*  NEUTROABS 9.3*  --   HGB 13.9 13.7  HCT 41.6 41.5  MCV 86.6 89.1  PLT 311 263    Basic Metabolic Panel:   Recent Labs Lab 04/18/17 1302 04/18/17 1909  NA 137  --   K 3.7  --   CL 102  --   CO2 26  --   GLUCOSE 159*  --   BUN 6  --   CREATININE 0.86 0.75  CALCIUM 9.2  --     Lipid Panel:     Component Value Date/Time   CHOL 240 (H) 04/19/2017 0718   CHOL 268 (H) 01/19/2014 1427   TRIG 414 (H) 04/19/2017 0718   TRIG 340 (H) 01/19/2014 1427   HDL 27 (L) 04/19/2017 0718   HDL 33 (L) 01/19/2014 1427   CHOLHDL 8.9 04/19/2017 0718   VLDL UNABLE TO CALCULATE IF TRIGLYCERIDE OVER 400 mg/dL 16/10/960407/09/2017 [id number]   VLDL 68 (H) 01/19/2014 1427   LDLCALC UNABLE TO CALCULATE IF TRIGLYCERIDE OVER 400 mg/dL 81/19/147807/09/2017 [id number]   LDLCALC 167 (H) 01/19/2014 1427  HgbA1c:  Lab Results  Component Value Date   HGBA1C 5.5 04/19/2017   Urine Drug Screen:     Component Value Date/Time   LABOPIA POSITIVE 01/11/2012 0519   COCAINSCRNUR POSITIVE 01/11/2012 0519   LABBENZ NEGATIVE 01/11/2012 0519   AMPHETMU NEGATIVE 01/11/2012 0519   THCU NEGATIVE 01/11/2012 0519   LABBARB NEGATIVE 01/11/2012 0519    Alcohol Level     Component Value Date/Time   ETH <5 04/18/2017 1302    IMAGING  Ct Angio Head W and Wo Contrast 04/18/2017 IMPRESSION: 1. Focal tapering and endoluminal or intramural filling defect in the left cervical internal carotid  artery suggesting dissection and thrombus. 2. No other significant cervical vascular disease 3. Asymmetric attenuation of the intracranial left internal carotid artery, left A1, left M1, and left MCA branch vessels suggesting decreased perfusion distal to the focal take brain and thrombus. 4. Scattered cortical infarcts involving the left MCA territory. Several of these appear remote. 5. More subtle cortical defects are seen best on the delayed images and likely represent acute areas of cortical infarct along the precentral gyrus. MRI would be useful further evaluation of acute infarct.   Dg Chest 2 View 04/18/2017 IMPRESSION: No active cardiopulmonary disease.  Mr Brain Wo Contrast 04/18/2017 IMPRESSION: 1. Patchy multifocal acute to early subacute ischemic nonhemorrhagic cortical and subcortical left MCA territory infarcts as above, likely embolic from previously identified left ICA dissection. 2. Superimposed scattered remote/chronic left MCA territory infarcts.   Ct Head Code Stroke W/o Cm 04/18/2017 IMPRESSION: 1. No acute intracranial abnormality. 2. ASPECTS is 10 3. Chronic left MCA infarct    PHYSICAL EXAM Pleasant middle aged Caucasian lady not in distress. . Afebrile. Head is nontraumatic. Neck is supple without bruit.    Cardiac exam no murmur or gallop. Lungs are clear to auscultation. Distal pulses are well felt. Neurological Exam ;  Awake  Alert oriented x 3. Mild dysarthric speech with occasional word finding difficulty. Good naming, repetition and comprehension.[place] movements full without nystagmus.fundi were not visualized. Vision acuity and fields appear normal. Hearing is normal. Palatal movements are normal. Face asymmetric with right lower facial weakness.. Tongue midline. Normal strength, tone, reflexes and coordination. Normal sensation. Gait deferred.  ASSESSMENT/PLAN Latasha Lawrence is a 46 y.o. female with history of IV drug abuse, tobacco abuse (current smoker),  hypertension, hyperlipidemia, fibromyalgia, and depression who presented to [facility] emergency department for evaluation of right-sided numbness and weakness. She did not receive IV t-PA due to arriving outside of the treatment window.    Stroke: acute and sub-acute scattered cortical infarcts involving the left MCA, very likely embolic in the setting of   ICA stenosis. Underlying fibromuscular dysplasia   Resultant  dysarthria, right facial droop  CT head: Chronic L MCA infarct  MRI head: 1. Patchy multifocal acute to early subacute ischemic nonhemorrhagic cortical and subcortical left MCA territory infarcts as above, likely embolic from previously identified left ICA dissection. 2. Superimposed scattered remote/chronic left MCA territory infarcts.   MRA head: not ordered  Carotid Doppler   pending  2D Echo  pending   LDL 167   HgbA1c 5.4  Heparin for VTE prophylaxis Diet Heart Room service appropriate? Yes; Fluid consistency: Thin  No antithrombotic prior to admission, now on aspirin 81 mg daily  Patient counseled to be compliant with her antithrombotic medications  Ongoing aggressive stroke risk factor management Therapy recommendations:   Outpatient speech therapy  Disposition:  Home  Hypertension  Stable  Permissive hypertension (OK if < 220/120) but gradually normalize in 5-7 days  Long-term BP goal normotensive  Hyperlipidemia  Home meds: none  LDL 167, goal < 70  Added atorvastatin 80mg  PO daily  Continue statin at discharge  Other Stroke Risk Factors  Cigarette smoker, advised to stop smoking  Obesity, Body mass index is 32.81 kg/m., recommend weight loss, diet and exercise as appropriate   Other Active Problems  None  Hospital day # 2  The patient has symptomatic left carotid stenosis and will benefit with elective revascularization with angioplasty stenting. I have discussed the case with the patient and Dr. Corliss Skains and  plan his 2 bring her back next week for elective left carotid angioplasty/stenting on Wednesday 04/25/17. Patient has history of stomach ulcer but given history of symptomatic carotid stenosis the risk-benefit of short-term goal antiplatelet therapy outweighs the risk of bleeding. Use coated aspirin and start Zantac for GI prophylaxis. . She needs elective left carotid revascularization with angioplasty and stenting early next week. Discussed with patient and Dr. Corliss Skains.   Delia Heady, MD Medical Director [facility] Pager: [phone] 04/20/2017 1:32 PM   To contact Stroke Continuity provider, please refer to [email or web address]. After hours, contact General Neurology

## 2017-04-20 NOTE — Progress Notes (Signed)
  Echocardiogram 2D Echocardiogram has been performed.  Latasha Lawrence, Latasha Lawrence 04/20/2017, 10:15 AM

## 2017-04-20 NOTE — Evaluation (Signed)
Occupational Therapy Evaluation and Discharge Patient Details Name: Latasha Lawrence MRN: [id number] DOB: 1971-04-19 Today's Date: 04/20/2017    History of Present Illness Pt adm from [place] with rt sided weakness and numbness. 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 PMH - IV drug abuse, tobacco abuse (current smoker), htn, fibromyalgia, and depression    Clinical Impression   Pt is functioning independently in ADL and mobility. Only notes difficulty with putting her hair in a pony tail and having difficulty coordinating R UE. Pt reports stress in her life. Educated in Automotive engineer. No further OT needs.    Follow Up Recommendations  No OT follow up    Equipment Recommendations  None recommended by OT    Recommendations for Other Services       Precautions / Restrictions Precautions Precautions: None Restrictions Weight Bearing Restrictions: No      Mobility Bed Mobility Overal bed mobility: Independent                Transfers Overall transfer level: Independent                    Balance Overall balance assessment: Independent                                         ADL either performed or assessed with clinical judgement   ADL Overall ADL's : Independent                                       General ADL Comments: Pt only notes difficulty putting her hair in a pony tail.     Vision Baseline Vision/History: No visual deficits       Perception     Praxis      Pertinent Vitals/Pain Pain Assessment: No/denies pain     Hand Dominance Left   Extremity/Trunk Assessment Upper Extremity Assessment Upper Extremity Assessment: Overall WFL for tasks assessed   Lower Extremity Assessment Lower Extremity Assessment: Defer to PT evaluation        Communication Communication Communication: No difficulties   Cognition Arousal/Alertness: Awake/alert Behavior During Therapy: WFL for tasks assessed/performed Overall Cognitive Status: Within Functional Limits for tasks assessed                                     General Comments       Exercises     Shoulder Instructions      Home Living Family/patient expects to be discharged to:: Private residence Living Arrangements: Spouse/significant other Available Help at Discharge: Family (husband is w/c bound) Type of Home: House Home Access: Ramped entrance     Home Layout: One level     Bathroom Shower/Tub: Chief Strategy Officer: Standard     Home Equipment: None      Lives With: Spouse    Prior Functioning/Environment Level of Independence: Independent        Comments: Works as Software engineer Problem List: Impaired UE functional use      OT Treatment/Interventions:  OT Goals(Current goals can be found in the care plan section) Acute Rehab OT Goals Patient Stated Goal: to stop smoking  OT Frequency:     Barriers to D/C:            Co-evaluation              AM-PAC PT "6 Clicks" Daily Activity     Outcome Measure Help from another person eating meals?: None Help from another person taking care of personal grooming?: None Help from another person toileting, which includes using toliet, bedpan, or urinal?: None Help from another person bathing (including washing, rinsing, drying)?: None Help from another person to put on and taking off regular upper body clothing?: None Help from another person to put on and taking off regular lower body clothing?: None 6 Click Score: 24   End of Session    Activity Tolerance: Patient tolerated treatment well Patient left: in bed;with call bell/phone within reach  OT Visit Diagnosis: Other (comment) (R UE incoordination)                Time: [id number] OT Time Calculation  (min): 17 min Charges:  OT General Charges $OT Visit: 1 Procedure OT Evaluation $OT Eval Low Complexity: 1 Procedure G-Codes:     Evern BioMayberry, Hatley Henegar Lynn 04/20/2017, 2:33 PM  [phone]

## 2017-04-20 NOTE — Discharge Summary (Signed)
Stroke Discharge Summary  Patient ID: Britta MccreedyRobin J Davisson    l   MRN: [id number]      DOB: [birth year]  Date of Admission: 04/18/2017 Date of Discharge: 04/20/2017  Attending Physician:  Micki RileySethi, Pramod S, MD, Stroke MD Consultant(s):    None Patient's PCP:  Patient, No Pcp Per  DISCHARGE DIAGNOSIS: Principal Problem:   Acute ischemic stroke [facility](HCC) - Stroke: acute and sub-acute scattered cortical infarcts involving the left MCA, very likely embolic in the setting of   ICA stenosis. Underlying fibromuscular dysplasia    Past Medical History:  Diagnosis Date  . Abnormal weight gain   . Anxiety   . Chronic back pain   . Controlled substance agreement terminated 07/2014   [facility]  . Depression   . Fibromyalgia   . Hirsutism   . Hypertension   . Hypokalemia   . Neutrophilic leukocytosis   . Periodontal disease   . Stomach ulcer   . Vitamin D deficiency disease    Past Surgical History:  Procedure Laterality Date  . Arm Surgery    . CESAREAN SECTION      Allergies as of 04/20/2017      Reactions   Morphine And Related Itching   Nsaids Other (See Comments)   Causes ulcers to bleed      Medication List    STOP taking these medications   acetaminophen-codeine 300-30 MG tablet Commonly known as:  TYLENOL #3   amoxicillin 500 MG tablet Commonly known as:  AMOXIL   cyclobenzaprine 10 MG tablet Commonly known as:  FLEXERIL   erythromycin base 500 MG tablet Commonly known as:  E-MYCIN   fluticasone 50 MCG/ACT nasal spray Commonly known as:  FLONASE   HYDROcodone-acetaminophen 5-325 MG tablet Commonly known as:  NORCO   oxyCODONE-acetaminophen 5-325 MG tablet Commonly known as:  ROXICET   predniSONE 50 MG tablet Commonly known as:  DELTASONE     TAKE these medications   aspirin 325 MG EC tablet Take 1 tablet (325 mg total) by mouth daily.   atorvastatin 80 MG tablet Commonly known as:  LIPITOR Take 1 tablet (80 mg total) by mouth  daily at 6 PM.   clopidogrel 75 MG tablet Commonly known as:  PLAVIX Take 1 tablet (75 mg total) by mouth daily.   famotidine 10 MG tablet Commonly known as:  PEPCID Take 4 tablets (40 mg total) by mouth daily.   gabapentin 100 MG capsule Commonly known as:  NEURONTIN Take 100 mg by mouth at bedtime. Take 1 cap at night along with 300mg  cap for total night dose of 1000mg .   gabapentin 300 MG capsule Commonly known as:  NEURONTIN Take 3 capsules by mouth at bedtime.   venlafaxine 75 MG tablet Commonly known as:  EFFEXOR Take 75 mg by mouth 2 (two) times daily.       LABORATORY STUDIES CBC    Component Value Date/Time   WBC 10.7 (H) 04/18/2017 1909   RBC 4.66 04/18/2017 1909   HGB 13.7 04/18/2017 1909   HGB 14.1 09/06/2014 0132   HCT 41.5 04/18/2017 1909   HCT 43.9 09/06/2014 0132   PLT 263 04/18/2017 1909   PLT 400 09/06/2014 0132   MCV 89.1 04/18/2017 1909   MCV 86 09/06/2014 0132   MCH 29.4 04/18/2017 1909   MCHC 33.0 04/18/2017 1909   RDW 14.4 04/18/2017 1909   RDW 15.1 (H) 09/06/2014 0132   LYMPHSABS 1.5 04/18/2017 1302   LYMPHSABS 2.1  09/06/2014 0132   MONOABS 0.5 04/18/2017 1302   MONOABS 0.9 09/06/2014 0132   EOSABS 0.1 04/18/2017 1302   EOSABS 0.1 09/06/2014 0132   BASOSABS 0.1 04/18/2017 1302   BASOSABS 0.0 09/06/2014 0132   CMP    Component Value Date/Time   NA 137 04/18/2017 1302   NA 138 09/06/2014 0132   K 3.7 04/18/2017 1302   K 3.8 09/06/2014 0132   CL 102 04/18/2017 1302   CL 105 09/06/2014 0132   CO2 26 04/18/2017 1302   CO2 27 09/06/2014 0132   GLUCOSE 159 (H) 04/18/2017 1302   GLUCOSE 105 (H) 09/06/2014 0132   BUN 6 04/18/2017 1302   BUN 8 09/06/2014 0132   CREATININE 0.75 04/18/2017 1909   CREATININE 0.97 09/06/2014 0132   CALCIUM 9.2 04/18/2017 1302   CALCIUM 9.4 09/06/2014 0132   PROT 7.7 04/18/2017 1302   PROT 8.6 (H) 09/06/2014 0132   ALBUMIN 3.9 04/18/2017 1302   ALBUMIN 4.1 09/06/2014 0132   AST 21 04/18/2017 1302    AST < 5 (L) 09/06/2014 0132   ALT 18 04/18/2017 1302   ALT 21 09/06/2014 0132   ALKPHOS 71 04/18/2017 1302   ALKPHOS 86 09/06/2014 0132   BILITOT 0.8 04/18/2017 1302   BILITOT 0.2 09/06/2014 0132   GFRNONAA >60 04/18/2017 1909   GFRNONAA >60 09/06/2014 0132   GFRNONAA >60 01/21/2014 0531   GFRAA >60 04/18/2017 1909   GFRAA >60 09/06/2014 0132   GFRAA >60 01/21/2014 0531   COAGS Lab Results  Component Value Date   INR 1.02 04/18/2017   Lipid Panel    Component Value Date/Time   CHOL 240 (H) 04/19/2017 0718   CHOL 268 (H) 01/19/2014 1427   TRIG 414 (H) 04/19/2017 0718   TRIG 340 (H) 01/19/2014 1427   HDL 27 (L) 04/19/2017 0718   HDL 33 (L) 01/19/2014 1427   CHOLHDL 8.9 04/19/2017 0718   VLDL UNABLE TO CALCULATE IF TRIGLYCERIDE OVER 400 mg/dL 69/62/9528 4132   VLDL 68 (H) 01/19/2014 1427   LDLCALC UNABLE TO CALCULATE IF TRIGLYCERIDE OVER 400 mg/dL 44/10/270 5366   LDLCALC 167 (H) 01/19/2014 1427   HgbA1C  Lab Results  Component Value Date   HGBA1C 5.5 04/19/2017   Urinalysis    Component Value Date/Time   COLORURINE YELLOW (A) 07/31/2015 2005   APPEARANCEUR CLEAR (A) 07/31/2015 2005   APPEARANCEUR Hazy 09/06/2014 0132   LABSPEC 1.025 07/31/2015 2005   LABSPEC 1.020 09/06/2014 0132   PHURINE 5.0 07/31/2015 2005   GLUCOSEU NEGATIVE 07/31/2015 2005   GLUCOSEU Negative 09/06/2014 0132   HGBUR 2+ (A) 07/31/2015 2005   BILIRUBINUR NEGATIVE 07/31/2015 2005   BILIRUBINUR Negative 09/06/2014 0132   KETONESUR NEGATIVE 07/31/2015 2005   PROTEINUR NEGATIVE 07/31/2015 2005   NITRITE NEGATIVE 07/31/2015 2005   LEUKOCYTESUR NEGATIVE 07/31/2015 2005   LEUKOCYTESUR Negative 09/06/2014 0132   Urine Drug Screen     Component Value Date/Time   LABOPIA POSITIVE 01/11/2012 0519   COCAINSCRNUR POSITIVE 01/11/2012 0519   LABBENZ NEGATIVE 01/11/2012 0519   AMPHETMU NEGATIVE 01/11/2012 0519   THCU NEGATIVE 01/11/2012 0519   LABBARB NEGATIVE 01/11/2012 0519    Alcohol Level     Component Value Date/Time   ETH <5 04/18/2017 1302     SIGNIFICANT DIAGNOSTIC STUDIES Ct Angio Head W and Wo Contrast 04/18/2017 IMPRESSION: 1. Focal tapering and endoluminal or intramural filling defect in the left cervical internal carotid artery suggesting dissection and thrombus. 2. No other significant cervical vascular  disease 3. Asymmetric attenuation of the intracranial left internal carotid artery, left A1, left M1, and left MCA branch vessels suggesting decreased perfusion distal to the focal take brain and thrombus. 4. Scattered cortical infarcts involving the left MCA territory. Several of these appear remote. 5. More subtle cortical defects are seen best on the delayed images and likely represent acute areas of cortical infarct along the precentral gyrus. MRI would be useful further evaluation of acute infarct.   Dg Chest 2 View 04/18/2017 IMPRESSION: No active cardiopulmonary disease.  Mr Brain Wo Contrast 04/18/2017 IMPRESSION: 1. Patchy multifocal acute to early subacute ischemic nonhemorrhagic cortical and subcortical left MCA territory infarcts as above, likely embolic from previously identified left ICA dissection. 2. Superimposed scattered remote/chronic left MCA territory infarcts.   Ct Head Code Stroke W/o Cm 04/18/2017 IMPRESSION: 1. No acute intracranial abnormality. 2. ASPECTS is 10 3. Chronic left MCA infarct  Carotid US Summary: Right mid to distal ICA velocities in the 60-79% range, however no plaque formation to suggest stenosis.  Left proximal ICA 1-39% stenosis, however could not visualize distal ICA due to shadowing and poor positioning. but damped left mid carotid waveform may suggest high-grade distal stenosis  Bilateral antegrade vertebral flow. Other specific details can be found in the table(s) above.  2D Echo 04/20/2017 Left ventricle: The cavity size was normal. There was mild  concentric hypertrophy. Systolic function was normal. The  estimated ejection fraction was in the range of 60% to 65%. Wall motion was normal; there were no regional wall motion abnormalities. Left ventricular diastolic function parameters were normal.  - Aortic valve: Transvalvular velocity was within the normal range.   There was no stenosis. There was no regurgitation. - Mitral valve: Transvalvular velocity was within the normal range.   There was no evidence for stenosis. There was mild regurgitation. - Left atrium: The atrium was severely dilated. - Right ventricle: The cavity size was normal. Wall thickness was   normal. Systolic function was normal. - Right atrium: The atrium was moderately dilated. - Atrial septum: No defect or patent foramen ovale was identified   by color flow Doppler. - Tricuspid valve: There was trivial regurgitation. - Pulmonary arteries: Systolic pressure was within the normal   range. PA peak pressure: 27 mm Hg (S). - Pericardium, extracardiac: A trivial pericardial effusion was   identified.     HISTORY OF PRESENT ILLNESS Adonis Ryther Spoonis a 46 y.o.femalewith a PMH of IV drug abuse, tobacco abuse (current smoker), hypertension, hyperlipidemia, fibromyalgia, and depression whopresented to [facility] emergency department for evaluation of right-sided numbness and weakness.  She said this numbness and weakness have been going on in her arm leg and face for the past few days but it wasn't until yesterday that she noticed right facial weakness of her lower face.  Initially she attributed all these problems to her fibromyalgia and thought she was just having increased symptoms of her fibromyalgia but when this facial weakness is persistent and she also noted some gait instability, swerving to the right, she decided to come in for workup.  She was seen at [facility] by Dr. Thad Ranger, who gave her initial stroke scale of 4.  A noncontrast head CT showed multiple areas of hypodensities in the  left MCA territory. CT of the head and neck was performed which showed a possible left internal carotid artery dissection versus thrombus.  She was transferred to [facility] for higher level of care, should she worsen  or her symptoms fluctuate.  On the review of systems, patient denies any radicular weight loss, she denies any prior illnesses, fevers, chills, shortness of breath, chest pain, palpitations, abdominal pain, nausea, vomiting, easy bruising or bleeding. She denies any miscarriages or clotting in legs/lungs. Denies family history of bleeding or clotting disorders. No family history suggestive of connective tissue disease.  LKW: 48h prior to presentation tpa given?: no, OSW ICH Score: NA Modified Rankin Score: 0   HOSPITAL COURSE Ms. DONNETTE MACMULLEN is a 46 y.o. female with history of IV drug abuse, tobacco abuse (current smoker), hypertension, hyperlipidemia, fibromyalgia, and depression whopresented to [facility] emergency department for evaluation of right-sided numbness and weakness. She did not receive IV t-PA due to arriving outside of the treatment window. The patient was admitted to ICU stepdown and remained neurologically stable. Blood pressure was controlled. She was started on dual antiplatelet therapy aspirin and Plavix. She underwent diagnostic 4 vessel cerebral catheter angiogram performed by Dr. Corliss Skains which showed a high-grade left carotid stenosis in the C1 portion. It was unclear wether there was underlying dissection or not. There are mild changes of fibromuscular dysplasia proximal to this. Dr. Corliss Skains felt patient was a candidate for carotid revascularization but could return next week for elective carotid angioplasty/stenting. The plan was discussed with the patient and she was in agreement. 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 Patchy multifocal acute to early subacute ischemic nonhemorrhagic cortical and subcortical left MCA territory infarcts as above, likely embolic from previously identified left ICA dissection. 2. Superimposed scattered remote/chronic left MCA territory infarcts.   MRA head: not ordered  Carotid Doppler : Right mid to distal ICA velocities in the 60-79% range, however noplaque formation to suggest stenosis.  2D Echo: EF 60-65%. No source of embolus  LDL 167            HgbA1c 5.4  Heparin for VTE prophylaxis  Diet NPO time specified Except for: Other (See Comments)  No antithrombotic prior to admission, now on aspirin 81 mg daily  Patient counseled to be compliant with her antithrombotic medications  Ongoing aggressive stroke risk factor management  Therapy recommendations:  None  Disposition: Discharge to home/self-care  Hypertension  Stable  Permissive hypertension (OK if < 220/120) but gradually normalize in 5-7 days  Long-term BP goal normotensive  Hyperlipidemia  Home meds: none  LDL 167, goal < 70  Add atorvastatin 80mg  PO daily  Continue statin at discharge  Other Stroke Risk Factors  Cigarette smoker, advised to stop smoking  Obesity, Body mass index is 32.81 kg/m., recommend weight loss, diet and exercise as appropriate   Other Active Problems None  DISCHARGE EXAM Blood pressure 128/81, pulse 64, temperature 98.5 F (36.9 C), temperature source Oral, resp. rate 20, height 5\' 6"  (1.676 m), weight 92.2 kg (203 lb 4.2 oz), last menstrual period 03/26/2017, SpO2 97 %.  PHYSICAL EXAM Pleasant middle aged Caucasian lady not in distress. .  Afebrile. Head is nontraumatic. Neck is supple without bruit.    Cardiac exam no murmur or gallop. Lungs are clear to auscultation.  Distal pulses are well felt. Neurological Exam ;  Awake  Alert oriented x 3. Mild dysarthric speech with occasional word finding difficulty. Good naming, repetition and comprehension.Marland [place] movements full without nystagmus.fundi were not visualized. Vision acuity and fields appear normal. Hearing is normal. Palatal movements are normal. Face asymmetric with right lower facial weakness.. Tongue midline. Normal strength, tone, reflexes and coordination. Normal sensation. Gait deferred.   Discharge Diet   Diet Heart Room service appropriate? Yes; Fluid consistency: Thin liquids  DISCHARGE PLAN  Disposition: Discharge to home/self-care  Return Wednesday 04/25/2017 for left carotid angioplasty/stenting procedure with Dr. Corliss Skains  aspirin 325 mg daily and clopidogrel 75 mg daily for secondary stroke prevention.  Ongoing risk factor control by Primary Care Physician at time of discharge  Establish care with a Primary Care physician immediately.  Follow-up Patient, No Pcp Per in 2 weeks.  Patient is to be enrolled in the PREMIERS Stroke Prevention Trial  Follow-up with Delia Heady, Stroke Clinic in 6 weeks, office to schedule an appointment.  were spent preparing discharge.  I have personally examined this patient, reviewed notes, independently viewed imaging studies, participated in medical decision making and plan of care.ROS completed by me personally and pertinent positives fully documented  I have made any additions or clarifications directly to the above note. Agree with note above.   Delia Heady, MD Medical Director [facility] Pager: [phone] 04/20/2017 3:39 PM

## 2017-04-20 NOTE — ED Triage Notes (Signed)
Pt in via POV with complaints of hematuria and dysuria, pt denies any flank pain.  [facility] just [facility], placed on plavix and advised to watch for any bleeding.  Pt with recent CVA, slurred speech and right side facial droop at baseline.  Vitals WDL, NAD noted at this time.

## 2017-04-21 NOTE — ED Provider Notes (Signed)
[facility] Emergency Department Provider Note   ____________________________________________   First MD Initiated Contact with Patient 04/20/17 2339     (approximate)  I have reviewed the triage vital signs and the nursing notes.   HISTORY  Chief Complaint Hematuria    HPI Latasha Lawrence is a 46 y.o. female who comes into the hospital today with hematuria. The patient was treated for a stroke at [facility] and discharged to home today the patient reports that when she arrived home she noticed there was some blood in her urine. She was told that if she had blood in her urine that she should discuss it with someone while she was [facility]. The patient reports that she had an angiogram but no catheter. She is supposed to have a carotid stent neck Sweet. The patient was placed on Plavix recently so she is concerned that it may be due to her Plavix. The patient reports that her urine sample tonight was very dark. She reports that her menstrual cycle was several weeks ago but does not think it restarted. She reports that when she urinates she notices some blood on the paper and in the toilet. The patient denies any back pain or abdominal pain. She reports that she's been drinking lots of fluids. She decided to contact [facility] when she saw the blood in her urine and came in for evaluation.The patient denies any pain with urination, flank pain, abdominal pain, nausea, vomiting.   Past Medical History:  Diagnosis Date  . Abnormal weight gain   . Anxiety   . Chronic back pain   . Controlled substance agreement terminated 07/2014   [facility]  . Depression   . Fibromyalgia   . Hirsutism   . Hypertension   . Hypokalemia   . Neutrophilic leukocytosis   . Periodontal disease   . Stomach ulcer   . Vitamin D deficiency disease     Patient Active Problem List   Diagnosis Date Noted  . Acute ischemic stroke (HCC) - Stroke: acute and  sub-acute scattered cortical infarcts involving the left MCA, very likely embolic in the setting of   ICA stenosis. Underlying fibromuscular dysplasia  04/18/2017  . Hypertension   . Fibromyalgia   . Vitamin D deficiency disease   . Hirsutism   . Hypokalemia   . Neutrophilic leukocytosis   . Periodontal disease   . Abnormal weight gain   . Chronic back pain     Past Surgical History:  Procedure Laterality Date  . Arm Surgery    . CESAREAN SECTION      Prior to Admission medications   Medication Sig Start Date End Date Taking? Authorizing Provider  aspirin EC 325 MG EC tablet Take 1 tablet (325 mg total) by mouth daily. 04/20/17  Yes Patteson, Paul DykesSamuel A, NP  atorvastatin (LIPITOR) 80 MG tablet Take 1 tablet (80 mg total) by mouth daily at 6 PM. 04/20/17  Yes Patteson, Paul DykesSamuel A, NP  clopidogrel (PLAVIX) 75 MG tablet Take 1 tablet (75 mg total) by mouth daily. 04/20/17  Yes Patteson, Paul DykesSamuel A, NP  famotidine (PEPCID) 10 MG tablet Take 4 tablets (40 mg total) by mouth daily. 04/20/17  Yes Patteson, Paul DykesSamuel A, NP  gabapentin (NEURONTIN) 100 MG capsule Take 100 mg by mouth at bedtime. Take 1 cap at night along with 300mg  cap for total night dose of 1000mg . 05/23/13  Yes [provider]  gabapentin (NEURONTIN) 300 MG capsule Take  3 capsules by mouth at bedtime. 05/23/13  Yes [provider]  venlafaxine (EFFEXOR) 75 MG tablet Take 75 mg by mouth 2 (two) times daily.   Yes [provider]    Allergies Morphine and related and Nsaids  Family History  Problem Relation Age of Onset  . Arthritis Mother   . Cancer Mother        breast  . Mental illness Mother   . Thyroid disease Mother   . Hyperlipidemia Father   . Hypertension Father     Social History Social History  Substance Use Topics  . Smoking status: Former Smoker    Packs/day: 0.00    Quit date: 04/19/2017  . Smokeless tobacco: Never Used  . Alcohol use No    Review of Systems  Constitutional: No  fever/chills Eyes: No visual changes. ENT: No sore throat. Cardiovascular: Denies chest pain. Respiratory: Denies shortness of breath. Gastrointestinal: No abdominal pain.  No nausea, no vomiting.  No diarrhea.  No constipation. Genitourinary: Hematuria Musculoskeletal: Negative for back pain. Skin: Negative for rash. Neurological: Negative for headaches, focal weakness or numbness.   ____________________________________________   PHYSICAL EXAM:  VITAL SIGNS: ED Triage Vitals [04/20/17 2208]  Enc Vitals Group     BP (!) 158/105     Pulse Rate 81     Resp 20     Temp 99.4 F (37.4 C)     Temp Source Oral     SpO2 100 %     Weight 180 lb (81.6 kg)     Height 5\' 6"  (1.676 m)     Head Circumference      Peak Flow      Pain Score 6     Pain Loc      Pain Edu?      Excl. in GC?     Constitutional: Alert and oriented. Well appearing and in Mild distress. Eyes: Conjunctivae are normal. PERRL. EOMI. Head: Atraumatic. Nose: No congestion/rhinnorhea. Mouth/Throat: Mucous membranes are moist.  Oropharynx non-erythematous. Cardiovascular: Normal rate, regular rhythm. Grossly normal heart sounds.  Good peripheral circulation. Respiratory: Normal respiratory effort.  No retractions. Lungs CTAB. Gastrointestinal: Soft and nontender. No distention. Positive bowel sounds with no CVA tenderness to palpation Musculoskeletal: No lower extremity tenderness nor edema.   Neurologic:  Normal speech and language.  Skin:  Skin is warm, dry and intact. Marland Kitchen Psychiatric: Mood and affect are normal.   ____________________________________________   LABS (all labs ordered are listed, but only abnormal results are displayed)  Labs Reviewed  URINALYSIS, COMPLETE (UACMP) WITH MICROSCOPIC - Abnormal; Notable for the following:       Result Value   Color, Urine YELLOW (*)    APPearance CLEAR (*)    Hgb urine dipstick MODERATE (*)    Squamous Epithelial / LPF 0-5 (*)    All other components  within normal limits  BASIC METABOLIC PANEL - Abnormal; Notable for the following:    Glucose, Bld 103 (*)    BUN <5 (*)    All other components within normal limits  CBC   ____________________________________________  EKG  none ____________________________________________  RADIOLOGY  No results found.  ____________________________________________   PROCEDURES  Procedure(s) performed: None  Procedures  Critical Care performed: No  ____________________________________________   INITIAL IMPRESSION / ASSESSMENT AND PLAN / ED COURSE  Pertinent labs & imaging results that were available during my care of the patient were reviewed by me and considered in my medical decision making (see chart for details).  This is a 46 year old female who comes into the hospital today with hematuria. She recently started on Plavix. The patient had some blood work done to include a urinalysis. The patient's urinalysis showed some moderate hemoglobin but 0-5 red blood cells in her urine. The patient's CBC and BMP are also unremarkable. I informed the patient that it could be some dark urine from possible dehydration. She could also have a renal stone or renal mass. I informed her that I was going to evaluate her with a CT renal stone study. The patient also would need to orally hydrate. As the patient was awaiting the CT scan she reports that she wants to go home. She reports that they shaved her for her angiogram and she thinks that they nicked her and that is where her bleeding came from. The patient does not think she needs to stay in the hospital anymore. As I am unsure if that is true the decision was made that the patient will need to sign out AGAINST MEDICAL ADVICE. The patient decided she'd rather go home and get some sleep versus staying in the emergency department. The patient was discharged AGAINST MEDICAL ADVICE. She should follow up with her primary care physician.       ____________________________________________   FINAL CLINICAL IMPRESSION(S) / ED DIAGNOSES  Final diagnoses:  Hematuria, unspecified type      NEW MEDICATIONS STARTED DURING THIS VISIT:  Discharge Medication List as of 04/21/2017 12:56 AM       Note:  This document was prepared using Dragon voice recognition software and may include unintentional dictation errors.    Rebecka Apley, MD 04/21/17 [phone]

## 2017-04-21 NOTE — ED Notes (Addendum)
Patient decided to check out AMA because patient realized that they had probably nicked her shaving and wanted to leave because she said she didn't need a scan

## 2017-04-23 ENCOUNTER — Other Ambulatory Visit (HOSPITAL_COMMUNITY): Payer: Self-pay | Admitting: Interventional Radiology

## 2017-04-23 ENCOUNTER — Other Ambulatory Visit: Payer: Self-pay | Admitting: Radiology

## 2017-04-23 ENCOUNTER — Encounter (HOSPITAL_COMMUNITY): Payer: Self-pay | Admitting: Interventional Radiology

## 2017-04-23 DIAGNOSIS — I6350 Cerebral infarction due to unspecified occlusion or stenosis of unspecified cerebral artery: Secondary | ICD-10-CM

## 2017-04-24 ENCOUNTER — Encounter (HOSPITAL_COMMUNITY): Payer: Self-pay | Admitting: *Deleted

## 2017-04-24 NOTE — Progress Notes (Signed)
Anesthesia Chart Review:  Pt is a same day work up  Pt is a 46 year old female scheduled for left carotid stent on 04/25/2017 with Julieanne CottonSanjeev Deveshwar, MD  PMH includes:  Stroke (04/18/17), HTN, fibromyalgia, GERD.  Former smoker. BMI 29  Medications include: ASA 325 mg, Lipitor, Plavix, Pepcid  Labs will be obtained DOS  CXR 04/18/17: No active cardiopulmonary disease.  EKG 04/18/17: Sinus rhythm. LAE, consider biatrial enlargement. Baseline wander in lead(s) I II III aVR aVL V3 V4.   Echo 04/20/17:  - Left ventricle: The cavity size was normal. There was mild concentric hypertrophy. Systolic function was normal. The estimated ejection fraction was in the range of 60% to 65%. Wall motion was normal; there were no regional wall motion abnormalities. Left ventricular diastolic function parameters were normal. - Aortic valve: Transvalvular velocity was within the normal range. There was no stenosis. There was no regurgitation. - Mitral valve: Transvalvular velocity was within the normal range. There was no evidence for stenosis. There was mild regurgitation. - Left atrium: The atrium was severely dilated. - Right ventricle: The cavity size was normal. Wall thickness was normal. Systolic function was normal. - Right atrium: The atrium was moderately dilated. - Atrial septum: No defect or patent foramen ovale was identified by color flow Doppler. - Tricuspid valve: There was trivial regurgitation. - Pulmonary arteries: Systolic pressure was within the normal range. PA peak pressure: 27 mm Hg (S). - Pericardium, extracardiac: A trivial pericardial effusion was identified.  Carotid duplex 04/19/17:  - Right mid to distal ICA velocities in the 60-79% range, however no plaque formation to suggest stenosis. - Left proximal ICA 1-39% stenosis, however could not visualize distal ICA due to shadowing and poor positioning. but damped left mid carotid waveform may suggest high-grade distal stenosis -  Bilateral antegrade vertebral flow.  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 - No angiographic evidence of an intimal flap is noted. - Findings suspicious of a dissection, possibly associated with minimal residual clot just distal to the severe stenosis. - Mild fibromuscular dysplastic changes involving the mid cervical right internal carotid artery.   If labs acceptable DOS, I anticipate pt can proceed as scheduled.   Rica Mastngela Kabbe, FNP-BC [facility]Anesthesiology Phone: [phone] 04/24/2017 3:46 PM

## 2017-04-25 ENCOUNTER — Encounter (HOSPITAL_COMMUNITY): Payer: Self-pay | Admitting: Certified Registered Nurse Anesthetist

## 2017-04-25 ENCOUNTER — Encounter (HOSPITAL_COMMUNITY)
Admission: AD | Disposition: A | Discharge status: Home / Self Care | Payer: Self-pay | Source: Ambulatory Visit | Attending: Interventional Radiology

## 2017-04-25 ENCOUNTER — Ambulatory Visit (HOSPITAL_COMMUNITY): Admitting: Critical Care Medicine

## 2017-04-25 ENCOUNTER — Observation Stay (HOSPITAL_COMMUNITY)
Admission: AD | Admit: 2017-04-25 | Discharge: 2017-04-26 | Disposition: A | Discharge status: Home / Self Care | Source: Ambulatory Visit | Attending: Interventional Radiology | Admitting: Interventional Radiology

## 2017-04-25 ENCOUNTER — Encounter (HOSPITAL_COMMUNITY): Payer: Self-pay

## 2017-04-25 DIAGNOSIS — E876 Hypokalemia: Secondary | ICD-10-CM | POA: Diagnosis not present

## 2017-04-25 DIAGNOSIS — M549 Dorsalgia, unspecified: Secondary | ICD-10-CM

## 2017-04-25 DIAGNOSIS — E559 Vitamin D deficiency, unspecified: Secondary | ICD-10-CM | POA: Insufficient documentation

## 2017-04-25 DIAGNOSIS — I69322 Dysarthria following cerebral infarction: Secondary | ICD-10-CM | POA: Insufficient documentation

## 2017-04-25 DIAGNOSIS — K219 Gastro-esophageal reflux disease without esophagitis: Secondary | ICD-10-CM | POA: Insufficient documentation

## 2017-04-25 DIAGNOSIS — I63239 Cerebral infarction due to unspecified occlusion or stenosis of unspecified carotid arteries: Secondary | ICD-10-CM | POA: Diagnosis present

## 2017-04-25 DIAGNOSIS — M797 Fibromyalgia: Secondary | ICD-10-CM | POA: Diagnosis not present

## 2017-04-25 DIAGNOSIS — I10 Essential (primary) hypertension: Secondary | ICD-10-CM | POA: Insufficient documentation

## 2017-04-25 DIAGNOSIS — Z8719 Personal history of other diseases of the digestive system: Secondary | ICD-10-CM | POA: Insufficient documentation

## 2017-04-25 DIAGNOSIS — D72828 Other elevated white blood cell count: Secondary | ICD-10-CM | POA: Insufficient documentation

## 2017-04-25 DIAGNOSIS — Z885 Allergy status to narcotic agent status: Secondary | ICD-10-CM | POA: Insufficient documentation

## 2017-04-25 DIAGNOSIS — I69320 Aphasia following cerebral infarction: Secondary | ICD-10-CM | POA: Diagnosis not present

## 2017-04-25 DIAGNOSIS — I669 Occlusion and stenosis of unspecified cerebral artery: Secondary | ICD-10-CM | POA: Diagnosis present

## 2017-04-25 DIAGNOSIS — I6522 Occlusion and stenosis of left carotid artery: Secondary | ICD-10-CM | POA: Insufficient documentation

## 2017-04-25 DIAGNOSIS — F419 Anxiety disorder, unspecified: Secondary | ICD-10-CM

## 2017-04-25 DIAGNOSIS — F329 Major depressive disorder, single episode, unspecified: Secondary | ICD-10-CM | POA: Diagnosis not present

## 2017-04-25 DIAGNOSIS — Z7902 Long term (current) use of antithrombotics/antiplatelets: Secondary | ICD-10-CM | POA: Insufficient documentation

## 2017-04-25 DIAGNOSIS — Z87891 Personal history of nicotine dependence: Secondary | ICD-10-CM

## 2017-04-25 DIAGNOSIS — I69351 Hemiplegia and hemiparesis following cerebral infarction affecting right dominant side: Secondary | ICD-10-CM | POA: Insufficient documentation

## 2017-04-25 DIAGNOSIS — G8929 Other chronic pain: Secondary | ICD-10-CM | POA: Insufficient documentation

## 2017-04-25 DIAGNOSIS — I7771 Dissection of carotid artery: Secondary | ICD-10-CM | POA: Insufficient documentation

## 2017-04-25 DIAGNOSIS — I6529 Occlusion and stenosis of unspecified carotid artery: Secondary | ICD-10-CM | POA: Diagnosis present

## 2017-04-25 HISTORY — DX: Cerebral infarction, unspecified: I63.9

## 2017-04-25 HISTORY — DX: Headache, unspecified: R51.9

## 2017-04-25 HISTORY — DX: Headache: R51

## 2017-04-25 HISTORY — DX: Gastro-esophageal reflux disease without esophagitis: K21.9

## 2017-04-25 HISTORY — PX: IR INTRAVSC STENT CERV CAROTID W/O EMB-PROT MOD SED INC ANGIO: IMG2304

## 2017-04-25 HISTORY — PX: RADIOLOGY WITH ANESTHESIA: SHX6223

## 2017-04-25 LAB — CBC WITH DIFFERENTIAL/PLATELET
Basophils Absolute: 0 10*3/uL (ref 0.0–0.1)
Basophils Relative: 0 %
EOS ABS: 0.2 10*3/uL (ref 0.0–0.7)
Eosinophils Relative: 2 %
HEMATOCRIT: 40.9 % (ref 36.0–46.0)
HEMOGLOBIN: 13.7 g/dL (ref 12.0–15.0)
LYMPHS ABS: 1.6 10*3/uL (ref 0.7–4.0)
LYMPHS PCT: 17 %
MCH: 29.4 pg (ref 26.0–34.0)
MCHC: 33.5 g/dL (ref 30.0–36.0)
MCV: 87.8 fL (ref 78.0–100.0)
MONOS PCT: 6 %
Monocytes Absolute: 0.6 10*3/uL (ref 0.1–1.0)
NEUTROS ABS: 7.5 10*3/uL (ref 1.7–7.7)
NEUTROS PCT: 75 %
Platelets: 338 10*3/uL (ref 150–400)
RBC: 4.66 MIL/uL (ref 3.87–5.11)
RDW: 13.8 % (ref 11.5–15.5)
WBC: 9.8 10*3/uL (ref 4.0–10.5)

## 2017-04-25 LAB — COMPREHENSIVE METABOLIC PANEL
ALBUMIN: 3.8 g/dL (ref 3.5–5.0)
ALK PHOS: 72 U/L (ref 38–126)
ALT: 23 U/L (ref 14–54)
AST: 23 U/L (ref 15–41)
Anion gap: 10 (ref 5–15)
BILIRUBIN TOTAL: 0.4 mg/dL (ref 0.3–1.2)
BUN: 8 mg/dL (ref 6–20)
CALCIUM: 9.1 mg/dL (ref 8.9–10.3)
CO2: 23 mmol/L (ref 22–32)
CREATININE: 0.77 mg/dL (ref 0.44–1.00)
Chloride: 104 mmol/L (ref 101–111)
GFR calc Af Amer: >60 mL/min (ref >60)
GFR calc non Af Amer: >60 mL/min (ref >60)
GLUCOSE: 118 mg/dL — AB (ref 65–99)
Potassium: 3.2 mmol/L — ABNORMAL LOW (ref 3.5–5.1)
SODIUM: 137 mmol/L (ref 135–145)
TOTAL PROTEIN: 7 g/dL (ref 6.5–8.1)

## 2017-04-25 LAB — PREGNANCY, URINE: PREG TEST UR: NEGATIVE

## 2017-04-25 LAB — PROTIME-INR
INR: 1.11
Prothrombin Time: 14.4 seconds (ref 11.4–15.2)

## 2017-04-25 LAB — PLATELET INHIBITION P2Y12: Platelet Function  P2Y12: 157 [PRU] — ABNORMAL LOW (ref 194–418)

## 2017-04-25 LAB — POCT ACTIVATED CLOTTING TIME
ACTIVATED CLOTTING TIME: 175 s
ACTIVATED CLOTTING TIME: 180 s
Activated Clotting Time: 180 seconds

## 2017-04-25 LAB — HEPARIN LEVEL (UNFRACTIONATED)

## 2017-04-25 LAB — APTT: aPTT: 32 seconds (ref 24–36)

## 2017-04-25 SURGERY — RADIOLOGY WITH ANESTHESIA
Anesthesia: General | Laterality: Left

## 2017-04-25 MED ORDER — NIMODIPINE 30 MG PO CAPS
ORAL_CAPSULE | ORAL | Status: AC
  Filled 2017-04-25: qty 1

## 2017-04-25 MED ORDER — ASPIRIN EC 325 MG PO TBEC: 325.0000 mg | DELAYED_RELEASE_TABLET | ORAL | Status: DC

## 2017-04-25 MED ORDER — IOPAMIDOL (ISOVUE-300) INJECTION 61%
INTRAVENOUS | Status: AC
  Filled 2017-04-25: qty 150

## 2017-04-25 MED ORDER — ONDANSETRON HCL 4 MG/2ML IJ SOLN
INTRAMUSCULAR | Status: DC | PRN
  Administered 2017-04-25: 4 mg via INTRAVENOUS

## 2017-04-25 MED ORDER — CLOPIDOGREL BISULFATE 75 MG PO TABS
ORAL_TABLET | ORAL | Status: AC
  Filled 2017-04-25: qty 1

## 2017-04-25 MED ORDER — EPTIFIBATIDE 20 MG/10ML IV SOLN
INTRAVENOUS | Status: AC
  Filled 2017-04-25: qty 10

## 2017-04-25 MED ORDER — MEPERIDINE HCL 25 MG/ML IJ SOLN: 6.2500 mg | INTRAMUSCULAR | Status: DC | PRN

## 2017-04-25 MED ORDER — SODIUM CHLORIDE 0.9 % IJ SOLN
INTRAVENOUS | Status: AC | PRN
  Administered 2017-04-25: 25 ug via INTRA_ARTERIAL

## 2017-04-25 MED ORDER — CLOPIDOGREL BISULFATE 75 MG PO TABS: 75.0000 mg | ORAL_TABLET | ORAL | Status: DC

## 2017-04-25 MED ORDER — CLOPIDOGREL BISULFATE 75 MG PO TABS
75.0000 mg | ORAL_TABLET | ORAL | Status: AC
  Administered 2017-04-25: 75 mg via ORAL
  Filled 2017-04-25: qty 1

## 2017-04-25 MED ORDER — PROMETHAZINE HCL 25 MG/ML IJ SOLN
6.2500 mg | INTRAMUSCULAR | Status: DC | PRN
Start: 2017-04-25 — End: 2017-04-25

## 2017-04-25 MED ORDER — ONDANSETRON HCL 4 MG/2ML IJ SOLN: 4.0000 mg | Freq: Four times a day (QID) | INTRAMUSCULAR | Status: DC | PRN

## 2017-04-25 MED ORDER — SODIUM CHLORIDE 0.9 % IV SOLN
INTRAVENOUS | Status: DC | PRN
  Administered 2017-04-25: 09:00:00 via INTRAVENOUS

## 2017-04-25 MED ORDER — HEPARIN (PORCINE) IN NACL 100-0.45 UNIT/ML-% IJ SOLN
500.0000 [IU]/h | INTRAMUSCULAR | Status: DC
  Filled 2017-04-25: qty 250

## 2017-04-25 MED ORDER — HYDROMORPHONE HCL 1 MG/ML IJ SOLN
0.2500 mg | INTRAMUSCULAR | Status: DC | PRN
Start: 2017-04-25 — End: 2017-04-25
  Administered 2017-04-25 (×3): 0.5 mg via INTRAVENOUS

## 2017-04-25 MED ORDER — ACETAMINOPHEN 650 MG RE SUPP: 650.0000 mg | RECTAL | Status: DC | PRN

## 2017-04-25 MED ORDER — OXYCODONE HCL 5 MG PO TABS: 5.0000 mg | ORAL_TABLET | Freq: Once | ORAL | Status: DC | PRN

## 2017-04-25 MED ORDER — HYDROCODONE-ACETAMINOPHEN 5-325 MG PO TABS
1.0000 | ORAL_TABLET | Freq: Four times a day (QID) | ORAL | Status: DC | PRN
Start: 2017-04-25 — End: 2017-04-25
  Administered 2017-04-25: 1 via ORAL
  Filled 2017-04-25: qty 1

## 2017-04-25 MED ORDER — IOPAMIDOL (ISOVUE-300) INJECTION 61%
INTRAVENOUS | Status: AC
  Administered 2017-04-25: 150 mL
  Filled 2017-04-25: qty 150

## 2017-04-25 MED ORDER — CEFAZOLIN SODIUM-DEXTROSE 2-4 GM/100ML-% IV SOLN
2.0000 g | INTRAVENOUS | Status: AC
  Administered 2017-04-25: 2 g via INTRAVENOUS
  Filled 2017-04-25: qty 100

## 2017-04-25 MED ORDER — NIMODIPINE 30 MG PO CAPS
0.0000 mg | ORAL_CAPSULE | ORAL | Status: AC
  Administered 2017-04-25: 60 mg via ORAL
  Filled 2017-04-25: qty 2

## 2017-04-25 MED ORDER — ASPIRIN 325 MG PO TABS
325.0000 mg | ORAL_TABLET | Freq: Every day | ORAL | Status: DC
  Administered 2017-04-26: 325 mg via ORAL
  Filled 2017-04-25: qty 1

## 2017-04-25 MED ORDER — IOPAMIDOL (ISOVUE-300) INJECTION 61%
40.0000 mL | Freq: Once | INTRAVENOUS | Status: AC | PRN
  Administered 2017-04-25: 40 mL via INTRAVENOUS

## 2017-04-25 MED ORDER — HYDROMORPHONE HCL 1 MG/ML IJ SOLN
INTRAMUSCULAR | Status: AC
  Filled 2017-04-25: qty 1

## 2017-04-25 MED ORDER — SODIUM CHLORIDE 0.9 % IV SOLN: INTRAVENOUS | Status: DC

## 2017-04-25 MED ORDER — ROCURONIUM BROMIDE 100 MG/10ML IV SOLN
INTRAVENOUS | Status: DC | PRN
  Administered 2017-04-25: 10 mg via INTRAVENOUS
  Administered 2017-04-25: 50 mg via INTRAVENOUS
  Administered 2017-04-25: 10 mg via INTRAVENOUS

## 2017-04-25 MED ORDER — ACETAMINOPHEN 325 MG PO TABS
650.0000 mg | ORAL_TABLET | ORAL | Status: DC | PRN
  Administered 2017-04-25: 650 mg via ORAL
  Filled 2017-04-25: qty 2

## 2017-04-25 MED ORDER — HEPARIN (PORCINE) IN NACL 100-0.45 UNIT/ML-% IJ SOLN
650.0000 [IU]/h | INTRAMUSCULAR | Status: DC
  Administered 2017-04-25: 650 [IU]/h via INTRAVENOUS
  Filled 2017-04-25: qty 250

## 2017-04-25 MED ORDER — SODIUM CHLORIDE 0.9 % IV SOLN
INTRAVENOUS | Status: DC
  Administered 2017-04-25 (×2): via INTRAVENOUS

## 2017-04-25 MED ORDER — CLOPIDOGREL BISULFATE 75 MG PO TABS
75.0000 mg | ORAL_TABLET | Freq: Every day | ORAL | Status: DC
  Administered 2017-04-26: 75 mg via ORAL
  Filled 2017-04-25: qty 1

## 2017-04-25 MED ORDER — HEPARIN SODIUM (PORCINE) 1000 UNIT/ML IJ SOLN
INTRAMUSCULAR | Status: DC | PRN
  Administered 2017-04-25: 1000 [IU] via INTRAVENOUS
  Administered 2017-04-25: 3000 [IU] via INTRAVENOUS
  Administered 2017-04-25: 500 [IU] via INTRAVENOUS

## 2017-04-25 MED ORDER — GABAPENTIN 300 MG PO CAPS
300.0000 mg | ORAL_CAPSULE | Freq: Every day | ORAL | Status: DC
  Administered 2017-04-25: 300 mg via ORAL
  Filled 2017-04-25: qty 1

## 2017-04-25 MED ORDER — EPHEDRINE SULFATE 50 MG/ML IJ SOLN
INTRAMUSCULAR | Status: DC | PRN
  Administered 2017-04-25: 10 mg via INTRAVENOUS

## 2017-04-25 MED ORDER — MIDAZOLAM HCL 5 MG/5ML IJ SOLN
INTRAMUSCULAR | Status: DC | PRN
  Administered 2017-04-25: 1 mg via INTRAVENOUS

## 2017-04-25 MED ORDER — OXYCODONE-ACETAMINOPHEN 5-325 MG PO TABS
1.0000 | ORAL_TABLET | Freq: Four times a day (QID) | ORAL | Status: DC | PRN
  Administered 2017-04-25 – 2017-04-26 (×2): 2 via ORAL
  Filled 2017-04-25 (×2): qty 2

## 2017-04-25 MED ORDER — HEPARIN (PORCINE) IN NACL 100-0.45 UNIT/ML-% IJ SOLN
900.0000 [IU]/h | INTRAMUSCULAR | Status: AC
  Filled 2017-04-25: qty 250

## 2017-04-25 MED ORDER — FENTANYL CITRATE (PF) 100 MCG/2ML IJ SOLN
INTRAMUSCULAR | Status: DC | PRN
  Administered 2017-04-25: 50 ug via INTRAVENOUS
  Administered 2017-04-25: 100 ug via INTRAVENOUS

## 2017-04-25 MED ORDER — FENTANYL CITRATE (PF) 100 MCG/2ML IJ SOLN: 25.0000 ug | Freq: Once | INTRAMUSCULAR | Status: DC | PRN

## 2017-04-25 MED ORDER — LABETALOL HCL 5 MG/ML IV SOLN
INTRAVENOUS | Status: DC | PRN
  Administered 2017-04-25 (×4): 5 mg via INTRAVENOUS

## 2017-04-25 MED ORDER — HYDROMORPHONE HCL 1 MG/ML IJ SOLN
INTRAMUSCULAR | Status: AC
  Filled 2017-04-25: qty 0.5

## 2017-04-25 MED ORDER — SUGAMMADEX SODIUM 200 MG/2ML IV SOLN
INTRAVENOUS | Status: DC | PRN
  Administered 2017-04-25: 200 mg via INTRAVENOUS

## 2017-04-25 MED ORDER — PROPOFOL 10 MG/ML IV BOLUS
INTRAVENOUS | Status: DC | PRN
  Administered 2017-04-25: 50 mg via INTRAVENOUS
  Administered 2017-04-25: 200 mg via INTRAVENOUS

## 2017-04-25 MED ORDER — PHENYLEPHRINE HCL 10 MG/ML IJ SOLN
INTRAVENOUS | Status: DC | PRN
  Administered 2017-04-25: 40 ug/min via INTRAVENOUS

## 2017-04-25 MED ORDER — ACETAMINOPHEN 160 MG/5ML PO SOLN: 650.0000 mg | ORAL | Status: DC | PRN

## 2017-04-25 MED ORDER — EPTIFIBATIDE 20 MG/10ML IV SOLN
INTRAVENOUS | Status: AC | PRN
  Administered 2017-04-25 (×3): 1.8 mg via INTRAVENOUS

## 2017-04-25 MED ORDER — OXYCODONE HCL 5 MG/5ML PO SOLN: 5.0000 mg | Freq: Once | ORAL | Status: DC | PRN

## 2017-04-25 MED ORDER — NITROGLYCERIN 1 MG/10 ML FOR IR/CATH LAB
INTRA_ARTERIAL | Status: AC
  Filled 2017-04-25: qty 10

## 2017-04-25 MED ORDER — CEFAZOLIN SODIUM-DEXTROSE 2-4 GM/100ML-% IV SOLN
INTRAVENOUS | Status: AC
  Filled 2017-04-25: qty 100

## 2017-04-25 MED ORDER — CLEVIDIPINE BUTYRATE 0.5 MG/ML IV EMUL
0.0000 mg/h | INTRAVENOUS | Status: DC
  Administered 2017-04-25: 0.5 mg/h via INTRAVENOUS
  Administered 2017-04-25: 8 mg/h via INTRAVENOUS
  Administered 2017-04-26: 7 mg/h via INTRAVENOUS
  Filled 2017-04-25 (×3): qty 50

## 2017-04-25 MED ORDER — LIDOCAINE HCL (CARDIAC) 20 MG/ML IV SOLN
INTRAVENOUS | Status: DC | PRN
  Administered 2017-04-25: 80 mg via INTRAVENOUS

## 2017-04-25 NOTE — Sedation Documentation (Addendum)
Angioplasty within stent

## 2017-04-25 NOTE — Transfer of Care (Signed)
Immediate Anesthesia Transfer of Care Note  Patient: Latasha Lawrence  Procedure(s) Performed: Procedure(s): RADIOLOGY WITH ANESTHESIA LEFT CARDIAC  STENT (Left)  Patient Location: PACU  Anesthesia Type:General  Level of Consciousness: awake, alert , oriented and patient cooperative  Airway & Oxygen Therapy: Patient Spontanous Breathing and Patient connected to nasal cannula oxygen  Post-op Assessment: Report given to RN, Post -op Vital signs reviewed and stable and Patient moving all extremities X 4  Post vital signs: Reviewed and stable  Last Vitals:  Vitals:   04/25/17 0709 04/25/17 0710  BP: (!) 158/88   Pulse:  84  Resp:  20  Temp: 36.7 C     Last Pain:  Vitals:   04/25/17 0822  TempSrc:   PainSc: 7       Patients Stated Pain Goal: 2 (04/25/17 16100822)  Complications: No apparent anesthesia complications

## 2017-04-25 NOTE — H&P (Signed)
  Chief Complaint: L ICA stenosis, recent CVA  Referring Physician:Dr. Pramod Sethi  Supervising Physician: Deveshwar, Sanjeev  Patient Status: [facility] - Out-pt  HPI: Latasha Lawrence is a 46 y.o. female who was just admitted last week secondary to embolic CVAs.  She underwent a cerebral angiogram which revealed L ICA stenosis which was likely the source of her CVA.  She does have some speech deficits as well as persistent right-sided weakness, but she feels as if these are gradually improving.  She did go to the ED on Friday after starting the plavix secondary to what she thought was hematuria.  She left AMA before further work up could be done; however, in the interim she has had her menstrual cycle start a week early and that is what her bleeding was from. She does also have a history of gastric ulcers, but denies any melena since starting her plavix.  She denies any dyspepsia, etc.  She presents today for L ICA stent placement.  Past Medical History:  Past Medical History:  Diagnosis Date  . Abnormal weight gain   . Anxiety   . Chronic back pain   . Controlled substance agreement terminated 07/2014   failed UDS at [facility]  . Depression   . Fibromyalgia   . GERD (gastroesophageal reflux disease)   . Headache   . Hirsutism   . Hypertension   . Hypokalemia   . Neutrophilic leukocytosis   . Periodontal disease   . Stomach ulcer   . Stroke (HCC)   . Vitamin D deficiency disease     Past Surgical History:  Past Surgical History:  Procedure Laterality Date  . Arm Surgery    . CESAREAN SECTION    . IR ANGIO INTRA EXTRACRAN SEL COM CAROTID INNOMINATE BILAT MOD SED  04/19/2017  . IR ANGIO VERTEBRAL SEL VERTEBRAL BILAT MOD SED  04/19/2017    Family History:  Family History  Problem Relation Age of Onset  . Arthritis Mother   . Cancer Mother        breast  . Mental illness Mother   . Thyroid disease Mother   . Hyperlipidemia Father   . Hypertension Father      Social History:  reports that she quit smoking 6 days ago. She smoked 0.00 packs per day. She has never used smokeless tobacco. She reports that she does not drink alcohol or use drugs.  Allergies:  Allergies  Allergen Reactions  . Nsaids Other (See Comments)    Causes ulcers to bleed  . Morphine And Related Itching    Medications: Medications reviewed in epic  Please HPI for pertinent positives, otherwise complete 10 system ROS negative.  Mallampati Score: MD Evaluation Airway: WNL Heart: WNL Abdomen: WNL Chest/ Lungs: WNL ASA  Classification: 3  Physical Exam: LMP 04/24/2017   Temp (F)   98.1  98.1 (36.7)  07/18 0709  Pulse Rate   84  84  07/18 0710  Resp   20  20  07/18 0710  BP   158/88  158/88  07/18 0709  SpO2 (%)   98  98  07/18 0710   General: pleasant, WD, WN white female who is laying in bed in NAD HEENT: head is normocephalic, atraumatic.  Sclera are noninjected.  PERRL.  Ears and nose without any masses or lesions.  Mouth is pink and moist, but has some right-side facial droop still Heart: regular, rate, and rhythm.  Normal s1,s2. No obvious murmurs, gallops, or   rubs noted.  Palpable radial and pedal pulses bilaterally Lungs: CTAB, no wheezes, rhonchi, or rales noted.  Respiratory effort nonlabored Abd: soft, NT, ND, +BS, no masses, hernias, or organomegaly Neuro: slight RUE drift when holding arms out, but otherwise good strength and coordination throughout upper and lower extremities.  Good eyelid strength.  Psych: A&Ox3 with an appropriate affect.   Labs: Results for orders placed or performed during the hospital encounter of 04/25/17 (from the past 48 hour(s))  APTT     Status: None   Collection Time: 04/25/17  7:00 AM  Result Value Ref Range   aPTT 32 24 - 36 seconds  CBC WITH DIFFERENTIAL     Status: None   Collection Time: 04/25/17  7:00 AM  Result Value Ref Range   WBC 9.8 4.0 - 10.5 K/uL   RBC 4.66 3.87 - 5.11 MIL/uL   Hemoglobin  13.7 12.0 - 15.0 g/dL   HCT 40.9 36.0 - 46.0 %   MCV 87.8 78.0 - 100.0 fL   MCH 29.4 26.0 - 34.0 pg   MCHC 33.5 30.0 - 36.0 g/dL   RDW 13.8 11.5 - 15.5 %   Platelets 338 150 - 400 K/uL   Neutrophils Relative % 75 %   Neutro Abs 7.5 1.7 - 7.7 K/uL   Lymphocytes Relative 17 %   Lymphs Abs 1.6 0.7 - 4.0 K/uL   Monocytes Relative 6 %   Monocytes Absolute 0.6 0.1 - 1.0 K/uL   Eosinophils Relative 2 %   Eosinophils Absolute 0.2 0.0 - 0.7 K/uL   Basophils Relative 0 %   Basophils Absolute 0.0 0.0 - 0.1 K/uL  Comprehensive metabolic panel     Status: Abnormal   Collection Time: 04/25/17  7:00 AM  Result Value Ref Range   Sodium 137 135 - 145 mmol/L   Potassium 3.2 (L) 3.5 - 5.1 mmol/L   Chloride 104 101 - 111 mmol/L   CO2 23 22 - 32 mmol/L   Glucose, Bld 118 (H) 65 - 99 mg/dL   BUN 8 6 - 20 mg/dL   Creatinine, Ser 0.77 0.44 - 1.00 mg/dL   Calcium 9.1 8.9 - 10.3 mg/dL   Total Protein 7.0 6.5 - 8.1 g/dL   Albumin 3.8 3.5 - 5.0 g/dL   AST 23 15 - 41 U/L   ALT 23 14 - 54 U/L   Alkaline Phosphatase 72 38 - 126 U/L   Total Bilirubin 0.4 0.3 - 1.2 mg/dL   GFR calc non Af Amer >60 >60 mL/min   GFR calc Af Amer >60 >60 mL/min    Comment: (NOTE) The eGFR has been calculated using the CKD EPI equation. This calculation has not been validated in all clinical situations. eGFR's persistently <60 mL/min signify possible Chronic Kidney Disease.    Anion gap 10 5 - 15  Platelet inhibition p2y12 (not at [facility])     Status: Abnormal   Collection Time: 04/25/17  7:00 AM  Result Value Ref Range   Platelet Function  P2Y12 157 (L) 194 - 418 PRU    Comment:        The literature has shown a direct correlation of PRU values over 230 with higher risks of thrombotic events.  Lower PRU values are associated with platelet inhibition.   Protime-INR     Status: None   Collection Time: 04/25/17  7:00 AM  Result Value Ref Range   Prothrombin Time 14.4 11.4 - 15.2 seconds   INR 1.11  Imaging: No results found.  Assessment/Plan 1. Recent CVA with L ICA stenosis  We will plan for angiogram today with possible revascularization of the L ICA stenosis with angioplasty and possible stenting.  Her labs and vitals have been reviewed.  We will give her one additional plavix today prior to her procedure.   Risks and Benefits discussed with the patient including, but not limited to bleeding, infection, vascular injury or contrast induced renal failure, possible CVA. All of the patient's questions were answered, patient is agreeable to proceed. Consent signed and in chart.  Thank you for this interesting consult.  I greatly enjoyed meeting Latasha Lawrence and look forward to participating in their care.  A copy of this report was sent to the requesting provider on this date.  Electronically Signed: Henreitta Cea 04/25/2017, 8:43 AM   I spent a total of    25 Minutes in face to face in clinical consultation, greater than 50% of which was counseling/coordinating care for L ICA stenosis

## 2017-04-25 NOTE — Progress Notes (Signed)
Afternoon Post Procedure Rounds  Supervising Physician: Julieanne Cotton  Patient Status:  Latasha Lawrence - In-pt  Chief Complaint: L ICA stenosis, recent CVA  Subjective:  Latasha Lawrence is doing ok. She reprots her back, head, and neck hurt.  She is happy she will be able to have the head of her bed raised in about 30 minutes.   Allergies: Nsaids and Morphine and related  Medications: Prior to Admission medications   Medication Sig Start Date End Date Taking? Authorizing Provider  aspirin EC 325 MG EC tablet Take 1 tablet (325 mg total) by mouth daily. 04/20/17  Yes Patteson, Paul Dykes, NP  atorvastatin (LIPITOR) 80 MG tablet Take 1 tablet (80 mg total) by mouth daily at 6 PM. 04/20/17  Yes Patteson, Paul Dykes, NP  clopidogrel (PLAVIX) 75 MG tablet Take 1 tablet (75 mg total) by mouth daily. 04/20/17  Yes Patteson, Paul Dykes, NP  famotidine (PEPCID) 10 MG tablet Take 4 tablets (40 mg total) by mouth daily. 04/20/17  Yes Patteson, Paul Dykes, NP  gabapentin (NEURONTIN) 300 MG capsule Take 3 capsules by mouth at bedtime. 05/23/13  Yes [provider]  venlafaxine (EFFEXOR) 75 MG tablet Take 75 mg by mouth 2 (two) times daily.   Yes [provider]     Vital Signs: BP 105/67   Pulse 69   Temp 98 F (36.7 C)   Resp (!) 21   Ht 5\' 6"  (1.676 m)   Wt 180 lb 12.4 oz (82 kg)   LMP 04/24/2017   SpO2 95%   BMI 29.18 kg/m   Physical Exam Awake and alert Mild facial asymmetry - unchanged Mild dysarthric speech PERRL/EOMI Sensory intact Rt CFA puncture site looks good,no hematoma or pseudoaneurysm Mild drift right arm. Good strength bilaterally, only slightly weaker on the right (pt left handed)  Imaging: No results found.  Labs:  CBC:  Recent Labs  04/18/17 1302 04/18/17 1909 04/20/17 2225 04/25/17 0700  WBC 11.5* 10.7* 9.5 9.8  HGB 13.9 13.7 14.6 13.7  HCT 41.6 41.5 42.8 40.9  PLT 311 263 304 338    COAGS:  Recent Labs  04/18/17 1302 04/25/17 0700    INR 1.02 1.11  APTT 33 32    BMP:  Recent Labs  04/18/17 1302 04/18/17 1909 04/20/17 2225 04/25/17 0700  NA 137  --  140 137  K 3.7  --  3.5 3.2*  CL 102  --  108 104  CO2 26  --  24 23  GLUCOSE 159*  --  103* 118*  BUN 6  --  <5* 8  CALCIUM 9.2  --  9.2 9.1  CREATININE 0.86 0.75 0.71 0.77  GFRNONAA >60 >60 >60 >60  GFRAA >60 >60 >60 >60    LIVER FUNCTION TESTS:  Recent Labs  04/18/17 1302 04/25/17 0700  BILITOT 0.8 0.4  AST 21 23  ALT 18 23  ALKPHOS 71 72  PROT 7.7 7.0  ALBUMIN 3.9 3.8    Assessment and Plan:  CVA 2nd to Left carotid stenosis  S/P LT common  Carotid  Arteriogram, followed by endovascular reconstruction of long seg dissection with high grade stenosis of Lt ICA with pipeline flow diverters x 2  and angioplasty by Dr. Corliss Skains.  Begin liquid diet. Advance to Heart Healthy diet as tolerated.  Probable D/C home in am.  Electronically Signed: Gwynneth Macleod, PA-C 04/25/2017, 4:22 PM   I spent a total of 15 Minutes at the the patient's bedside AND on  the patient's hospital floor or unit, greater than 50% of which was counseling/coordinating care for same day post op rounds.

## 2017-04-25 NOTE — Sedation Documentation (Signed)
Pipeline stent deployed

## 2017-04-25 NOTE — Anesthesia Procedure Notes (Signed)
Arterial Line Insertion Start/End7/18/2018 9:05 AM, 04/25/2017 9:10 AM Performed by: Kirt BoysMUELLER, Aggie Douse P  Patient location: Pre-op. Preanesthetic checklist: patient identified, IV checked and pre-op evaluation Patient sedated Left, radial was placed Catheter size: 20 G Hand hygiene performed  and maximum sterile barriers used  Allen's test indicative of satisfactory collateral circulation Attempts: 1 Procedure performed without using ultrasound guided technique. Following insertion, dressing applied and Biopatch. Post procedure assessment: normal and unchanged  Patient tolerated the procedure well with no immediate complications.

## 2017-04-25 NOTE — Anesthesia Postprocedure Evaluation (Signed)
Anesthesia Post Note  Patient: Kathryne HitchRobin J Merritts  Procedure(s) Performed: Procedure(s) (LRB): RADIOLOGY WITH ANESTHESIA LEFT CARDIAC  STENT (Left)     Patient location during evaluation: PACU Anesthesia Type: General Level of consciousness: awake and alert Pain management: pain level controlled Vital Signs Assessment: post-procedure vital signs reviewed and stable Respiratory status: spontaneous breathing, nonlabored ventilation and respiratory function stable Cardiovascular status: blood pressure returned to baseline and stable Postop Assessment: no signs of nausea or vomiting Anesthetic complications: no    Last Vitals:  Vitals:   04/25/17 1400 04/25/17 1415  BP: 118/76 114/68  Pulse: 63 63  Resp: 19 14  Temp:      Last Pain:  Vitals:   04/25/17 1400  TempSrc:   PainSc: 7                  Lowella CurbWarren Ray Miller

## 2017-04-25 NOTE — Anesthesia Preprocedure Evaluation (Addendum)
Anesthesia Evaluation  Patient identified by MRN, date of birth, ID band Patient awake    Reviewed: Allergy & Precautions, NPO status , Patient's Chart, lab work & pertinent test results  Airway Mallampati: II  TM Distance: >3 FB Neck ROM: Full    Dental no notable dental hx. (+) Poor Dentition, Dental Advisory Given   Pulmonary neg pulmonary ROS, former smoker,    Pulmonary exam normal breath sounds clear to auscultation       Cardiovascular hypertension, Pt. on medications negative cardio ROS Normal cardiovascular exam Rhythm:Regular Rate:Normal     Neuro/Psych  Headaches, Anxiety Depression CVA, Residual Symptoms negative neurological ROS  negative psych ROS   GI/Hepatic negative GI ROS, Neg liver ROS, GERD  Controlled,  Endo/Other  negative endocrine ROS  Renal/GU negative Renal ROS  negative genitourinary   Musculoskeletal negative musculoskeletal ROS (+) Fibromyalgia -  Abdominal   Peds negative pediatric ROS (+)  Hematology negative hematology ROS (+)   Anesthesia Other Findings Carotid Stenosis  Reproductive/Obstetrics negative OB ROS                            Anesthesia Physical Anesthesia Plan  ASA: III  Anesthesia Plan: General   Post-op Pain Management:    Induction: Intravenous  PONV Risk Score and Plan: 3 and Ondansetron, Dexamethasone, Propofol and Midazolam  Airway Management Planned: Oral ETT  Additional Equipment: Arterial line  Intra-op Plan:   Post-operative Plan: Extubation in OR  Informed Consent: I have reviewed the patients History and Physical, chart, labs and discussed the procedure including the risks, benefits and alternatives for the proposed anesthesia with the patient or authorized representative who has indicated his/her understanding and acceptance.   Dental advisory given  Plan Discussed with: CRNA, Anesthesiologist and  Surgeon  Anesthesia Plan Comments:       Anesthesia Quick Evaluation

## 2017-04-25 NOTE — Anesthesia Procedure Notes (Signed)
Procedure Name: Intubation Date/Time: 04/25/2017 10:02 AM Performed by: Carney Living Pre-anesthesia Checklist: Patient identified, Emergency Drugs available, Suction available, Patient being monitored and Timeout performed Patient Re-evaluated:Patient Re-evaluated prior to induction Oxygen Delivery Method: Circle system utilized Preoxygenation: Pre-oxygenation with 100% oxygen Induction Type: IV induction Ventilation: Mask ventilation without difficulty and Oral airway inserted - appropriate to patient size Laryngoscope Size: Mac and 4 Grade View: Grade I Tube type: Oral Tube size: 7.5 mm Number of attempts: 1 Airway Equipment and Method: Stylet Placement Confirmation: ETT inserted through vocal cords under direct vision,  positive ETCO2 and breath sounds checked- equal and bilateral Secured at: 23 cm Tube secured with: Tape Dental Injury: Teeth and Oropharynx as per pre-operative assessment

## 2017-04-25 NOTE — Sedation Documentation (Signed)
Tampon removed during foley insertion due to fear tampon would be forgotten and left in too long.

## 2017-04-25 NOTE — Sedation Documentation (Signed)
Exoseal closure by [employer] GlobalCorey Sandling RT, manual pressure held

## 2017-04-25 NOTE — Procedures (Signed)
S/P LT common  Carotid  Arteriogram,followed by endovascular reconstruction of long seg dissection with high grade stenosis of Lt ICA with  With pipeline flow diverters x 2  and angioplasty

## 2017-04-25 NOTE — Progress Notes (Signed)
ANTICOAGULATION CONSULT NOTE - Initial Consult  Pharmacy Consult for heparin Indication: s/p left ICA stent placement  Allergies  Allergen Reactions  . Nsaids Other (See Comments)    Causes ulcers to bleed  . Morphine And Related Itching    Patient Measurements:   Heparin Dosing Weight: 76 kg  Vital Signs: Temp: 98.1 F (36.7 C) (07/18 0709) Temp Source: Oral (07/18 0709) BP: 158/88 (07/18 0709) Pulse Rate: 84 (07/18 0710)  Labs:  Recent Labs  04/25/17 0700  HGB 13.7  HCT 40.9  PLT 338  APTT 32  LABPROT 14.4  INR 1.11  CREATININE 0.77    Estimated Creatinine Clearance: 96.6 mL/min (by C-G formula based on SCr of 0.77 mg/dL).   Medical History: Past Medical History:  Diagnosis Date  . Abnormal weight gain   . Anxiety   . Chronic back pain   . Controlled substance agreement terminated 07/2014   [facility]  . Depression   . Fibromyalgia   . GERD (gastroesophageal reflux disease)   . Headache   . Hirsutism   . Hypertension   . Hypokalemia   . Neutrophilic leukocytosis   . Periodontal disease   . Stomach ulcer   . Stroke (HCC)   . 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 She is s/p left ICA stenting. Pharmacy consulted to manage heparin drip until tomorrow morning. No bleeding noted, CBC is normal. Renal function is normal. PACU to start heparin drip at 500 units/hr.  Goal of Therapy:  Heparin level 0.1-0.25 units/ml Monitor platelets by anticoagulation protocol: Yes   Plan:  - Increase heparin drip to 650 units/hr - 6 hr heparin level - Monitor for s/sx of bleeding - Heparin drip to be turned off at 7:00 on 7/19   Loura BackJennifer Saybrook, PharmD, [facility] Clinical Pharmacist Main pharmacy - [phone] 04/25/2017 12:52 PM

## 2017-04-25 NOTE — Progress Notes (Signed)
ANTICOAGULATION CONSULT NOTE   Pharmacy Consult for heparin Indication: s/p left ICA stent placement  Allergies  Allergen Reactions  . Nsaids Other (See Comments)    Causes ulcers to bleed  . Morphine And Related Itching    Patient Measurements: Height: 5\' 6"  (167.6 cm) Weight: 180 lb 12.4 oz (82 kg) IBW/kg (Calculated) : 59.3 Heparin Dosing Weight: 76 kg  Vital Signs: Temp: 98 F (36.7 C) (07/18 1445) BP: 132/81 (07/18 1845) Pulse Rate: 67 (07/18 1845)  Labs:  Recent Labs  04/25/17 0700 04/25/17 1909  HGB 13.7  --   HCT 40.9  --   PLT 338  --   APTT 32  --   LABPROT 14.4  --   INR 1.11  --   HEPARINUNFRC  --  <0.10*  CREATININE 0.77  --     Estimated Creatinine Clearance: 96.9 mL/min (by C-G formula based on SCr of 0.77 mg/dL).   Medical History: Past Medical History:  Diagnosis Date  . Abnormal weight gain   . Anxiety   . Chronic back pain   . Controlled substance agreement terminated 07/2014   [facility]  . Depression   . Fibromyalgia   . GERD (gastroesophageal reflux disease)   . Headache   . Hirsutism   . Hypertension   . Hypokalemia   . Neutrophilic leukocytosis   . Periodontal disease   . Stomach ulcer   . Stroke (HCC)   . 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 She is s/p left ICA stenting. Pharmacy consulted to manage heparin drip until tomorrow morning. No bleeding noted, CBC is normal. Renal function is normal. PACU to start heparin drip at 500 units/hr.  Initial heparin level this evening is undetectable, did verify that no issues per nursing. Will increase to 10 units/kg/hr (800 units/hr).   Goal of Therapy:  Heparin level 0.1-0.25 units/ml Monitor platelets by anticoagulation protocol: Yes   Plan:  - Increase heparin drip to 800 units/hr - Monitor for s/sx of bleeding -  Heparin drip to be turned off at 7:00 on 7/19  Sheppard CoilFrank Chestina Komatsu PharmD., [facility]PS Clinical Pharmacist Pager [phone] 04/25/2017 8:27 PM

## 2017-04-25 NOTE — H&P (Deleted)
  The note originally documented on this encounter has been moved the the encounter in which it belongs.  

## 2017-04-25 NOTE — Sedation Documentation (Addendum)
Slight right sided mouth droop evident, per pt still having right leg weakness. Speech clear at this time. Moved self to IR table easily.

## 2017-04-25 NOTE — Sedation Documentation (Signed)
Second pipeline device deployed

## 2017-04-25 NOTE — Sedation Documentation (Signed)
C collar place, ready for tx to PACU, neuro assmt done by DrDeveshwar.

## 2017-04-26 ENCOUNTER — Ambulatory Visit (HOSPITAL_COMMUNITY)
Admission: RE | Admit: 2017-04-26 | Discharge: 2017-04-26 | Disposition: A | Discharge status: Home / Self Care | Source: Ambulatory Visit | Attending: Interventional Radiology | Admitting: Interventional Radiology

## 2017-04-26 ENCOUNTER — Encounter (HOSPITAL_COMMUNITY): Payer: Self-pay | Admitting: Interventional Radiology

## 2017-04-26 DIAGNOSIS — I6522 Occlusion and stenosis of left carotid artery: Secondary | ICD-10-CM | POA: Diagnosis not present

## 2017-04-26 DIAGNOSIS — I6350 Cerebral infarction due to unspecified occlusion or stenosis of unspecified cerebral artery: Secondary | ICD-10-CM

## 2017-04-26 DIAGNOSIS — I63239 Cerebral infarction due to unspecified occlusion or stenosis of unspecified carotid arteries: Secondary | ICD-10-CM | POA: Diagnosis present

## 2017-04-26 DIAGNOSIS — I6529 Occlusion and stenosis of unspecified carotid artery: Secondary | ICD-10-CM | POA: Diagnosis present

## 2017-04-26 LAB — CBC WITH DIFFERENTIAL/PLATELET
BASOS ABS: 0 10*3/uL (ref 0.0–0.1)
Basophils Relative: 0 %
EOS ABS: 0.1 10*3/uL (ref 0.0–0.7)
EOS PCT: 1 %
HCT: 37.4 % (ref 36.0–46.0)
Hemoglobin: 11.9 g/dL — ABNORMAL LOW (ref 12.0–15.0)
LYMPHS PCT: 20 %
Lymphs Abs: 1.9 10*3/uL (ref 0.7–4.0)
MCH: 27.9 pg (ref 26.0–34.0)
MCHC: 31.8 g/dL (ref 30.0–36.0)
MCV: 87.8 fL (ref 78.0–100.0)
MONO ABS: 0.6 10*3/uL (ref 0.1–1.0)
Monocytes Relative: 6 %
Neutro Abs: 7 10*3/uL (ref 1.7–7.7)
Neutrophils Relative %: 73 %
PLATELETS: 328 10*3/uL (ref 150–400)
RBC: 4.26 MIL/uL (ref 3.87–5.11)
RDW: 14 % (ref 11.5–15.5)
WBC: 9.6 10*3/uL (ref 4.0–10.5)

## 2017-04-26 LAB — BASIC METABOLIC PANEL
Anion gap: 4 — ABNORMAL LOW (ref 5–15)
CO2: 25 mmol/L (ref 22–32)
CREATININE: 0.58 mg/dL (ref 0.44–1.00)
Calcium: 8.4 mg/dL — ABNORMAL LOW (ref 8.9–10.3)
Chloride: 110 mmol/L (ref 101–111)
GFR calc Af Amer: >60 mL/min (ref >60)
GLUCOSE: 94 mg/dL (ref 65–99)
POTASSIUM: 3.2 mmol/L — AB (ref 3.5–5.1)
SODIUM: 139 mmol/L (ref 135–145)

## 2017-04-26 LAB — PLATELET INHIBITION P2Y12: Platelet Function  P2Y12: 93 [PRU] — ABNORMAL LOW (ref 194–418)

## 2017-04-26 LAB — HEPARIN LEVEL (UNFRACTIONATED)

## 2017-04-26 NOTE — Discharge Summary (Signed)
   Patient ID: Latasha Lawrence MRN: [id number] DOB/AGE: [birth year] 46 y.o.  Admit date: 04/25/2017 Discharge date: 04/26/2017  Supervising Physician: Julieanne Cottoneveshwar, Sanjeev  Admission Diagnoses: L ICA stenosis  Discharge Diagnoses:  Active Problems:   Cerebral embolus   Internal carotid artery stenosis   Discharged Condition: good  Hospital Course: the patient was admitted and underwent stenting x2 of her L ICA stenosis.  She tolerated this procedure well.  Routine post procedure care was completed.  The following day, she was voiding well, tolerating a regular diet, and mobilizing.  She was taking her plavix (current P2Y12 is pending).  She denied any pain.  She was felt stable for discharge home after discussions regarding obtaining a PCP for her HTN and smoking cessation.  Consults: None  Discharge Exam: Blood pressure (!) 129/104, pulse 79, temperature 98.7 F (37.1 C), temperature source Oral, resp. rate (!) 23, height 5\' 6"  (1.676 m), weight 180 lb 12.4 oz (82 kg), last menstrual period 04/24/2017, SpO2 97 %. General appearance: alert, cooperative and no distress Resp: clear to auscultation bilaterally Cardio: regular rate and rhythm Neurologic: Alert and oriented X 3, normal strength and tone. Normal coordination.  EOMI.  PERRL.  Facial droop is less noticeable on right side today.  Palpable pedal pulses bilaterally Incision/Wound: R CFA site is c/d/i  Disposition: 01-Home or Self Care   Allergies as of 04/26/2017      Reactions   Nsaids Other (See Comments)   Causes ulcers to bleed   Morphine And Related Itching      Medication List    TAKE these medications   aspirin 325 MG EC tablet Take 1 tablet (325 mg total) by mouth daily.   atorvastatin 80 MG tablet Commonly known as:  LIPITOR Take 1 tablet (80 mg total) by mouth daily at 6 PM.   clopidogrel 75 MG tablet Commonly known as:  PLAVIX Take 1 tablet (75 mg total) by mouth daily.   famotidine 10 MG  tablet Commonly known as:  PEPCID Take 4 tablets (40 mg total) by mouth daily.   gabapentin 300 MG capsule Commonly known as:  NEURONTIN Take 3 capsules by mouth at bedtime.   venlafaxine 75 MG tablet Commonly known as:  EFFEXOR Take 75 mg by mouth 2 (two) times daily.      Follow-up Information    Julieanne Cottoneveshwar, Sanjeev, MD Follow up in 2 week(s).   Specialty:  Interventional Radiology Why:  Our scheduler will call you with appointment date and time Contact information: [address] [place] [id number] [phone]            Electronically Signed: Barnetta ChapelSBORNE,Quandre Polinski E 04/26/2017, 9:39 AM   I have spent Less Than 30 Minutes discharging Latasha Lawrence.

## 2017-04-26 NOTE — Discharge Instructions (Signed)
Angiogram, Care After This sheet gives you information about how to care for yourself after your procedure. Your doctor may also give you more specific instructions. If you have problems or questions, contact your doctor. Follow these instructions at home: Insertion site care  Follow instructions from your doctor about how to take care of your long, thin tube (catheter) insertion area. Make sure you: ? Wash your hands with soap and water before you change your bandage (dressing). If you cannot use soap and water, use hand sanitizer. ? Change your bandage as told by your doctor.  Do not take baths, swim, or use a hot tub until your doctor says it is okay.  You may shower 24 hours after the procedure or as told by your doctor. ? Gently wash the area with plain soap and water. ? Pat the area dry with a clean towel. ? Do not rub the area. This may cause bleeding.  Do not apply powder or lotion to the area. Keep the area clean and dry.  Check your insertion area every day for signs of infection. Check for: ? More redness, swelling, or pain. ? Fluid or blood. ? Warmth. ? Pus or a bad smell. Activity  Rest as told by your doctor, usually for 1-2 days.  Do not lift anything that is heavier than 10 lbs. (4.5 kg) or as told by your doctor for 1 week.  Do not drive for 2 weeks after the procedure  Do not drive or use heavy machinery while taking prescription pain medicine. General instructions  Go back to your normal activities as told by your doctor, usually in about a week. Ask your doctor what activities are safe for you.  If the insertion area starts to bleed, lie flat and put pressure on the area. If the bleeding does not stop, get help right away. This is an emergency.  Drink enough fluid to keep your pee (urine) clear or pale yellow.  Take over-the-counter and prescription medicines only as told by your doctor.  Keep all follow-up visits as told by your doctor. This is  important. Contact a doctor if:  You have a fever.  You have chills.  You have more redness, swelling, or pain around your insertion area.  You have fluid or blood coming from your insertion area.  The insertion area feels warm to the touch.  You have pus or a bad smell coming from your insertion area.  You have more bruising around the insertion area.  Blood collects in the tissue around the insertion area (hematoma) that may be painful to the touch. Get help right away if:  You have a lot of pain in the insertion area.  The insertion area swells very fast.  The insertion area is bleeding, and the bleeding does not stop after holding steady pressure on the area.  The area near or just beyond the insertion area becomes pale, cool, tingly, or numb. These symptoms may be an emergency. Do not wait to see if the symptoms will go away. Get medical help right away. Call your local emergency services (911 in the [place] Do not drive yourself to the hospital. Summary  After the procedure, it is common to have bruising and tenderness at the long, thin tube insertion area.  After the procedure, it is important to rest and drink plenty of fluids.  Do not take baths, swim, or use a hot tub until your doctor says it is okay to do so. You may shower  24-48 hours after the procedure or as told by your doctor.  If the insertion area starts to bleed, lie flat and put pressure on the area. If the bleeding does not stop, get help right away. This is an emergency. This information is not intended to replace advice given to you by your health care provider. Make sure you discuss any questions you have with your health care provider. Document Released: 12/22/2008 Document Revised: 09/19/2016 Document Reviewed: 09/19/2016 Elsevier Interactive Patient Education  2017 Elsevier Inc.   Hypertension (PLEASE GET A PRIMARY CARE DOCTOR ASAP) Hypertension is another name for high blood pressure. High blood  pressure forces your heart to work harder to pump blood. This can cause problems over time. There are two numbers in a blood pressure reading. There is a top number (systolic) over a bottom number (diastolic). It is best to have a blood pressure below 120/80. Healthy choices can help lower your blood pressure. You may need medicine to help lower your blood pressure if:  Your blood pressure cannot be lowered with healthy choices.  Your blood pressure is higher than 130/80.  Follow these instructions at home: Eating and drinking  If directed, follow the DASH eating plan. This diet includes: ? Filling half of your plate at each meal with fruits and vegetables. ? Filling one quarter of your plate at each meal with whole grains. Whole grains include whole wheat pasta, brown rice, and whole grain bread. ? Eating or drinking low-fat dairy products, such as skim milk or low-fat yogurt. ? Filling one quarter of your plate at each meal with low-fat (lean) proteins. Low-fat proteins include fish, skinless chicken, eggs, beans, and tofu. ? Avoiding fatty meat, cured and processed meat, or chicken with skin. ? Avoiding premade or processed food.  Eat less than 1,500 mg of salt (sodium) a day.  Limit alcohol use to no more than 1 drink a day for nonpregnant women and 2 drinks a day for men. One drink equals 12 oz of beer, 5 oz of wine, or 1 oz of hard liquor. Lifestyle  Work with your doctor to stay at a healthy weight or to lose weight. Ask your doctor what the best weight is for you.  Get at least 30 minutes of exercise that causes your heart to beat faster (aerobic exercise) most days of the week. This may include walking, swimming, or biking.  Get at least 30 minutes of exercise that strengthens your muscles (resistance exercise) at least 3 days a week. This may include lifting weights or pilates.  Do not use any products that contain nicotine or tobacco. This includes cigarettes and  e-cigarettes. If you need help quitting, ask your doctor.  Check your blood pressure at home as told by your doctor.  Keep all follow-up visits as told by your doctor. This is important. Medicines  Take over-the-counter and prescription medicines only as told by your doctor. Follow directions carefully.  Do not skip doses of blood pressure medicine. The medicine does not work as well if you skip doses. Skipping doses also puts you at risk for problems.  Ask your doctor about side effects or reactions to medicines that you should watch for. Contact a doctor if:  You think you are having a reaction to the medicine you are taking.  You have headaches that keep coming back (recurring).  You feel dizzy.  You have swelling in your ankles.  You have trouble with your vision. Get help right away if:  You  get a very bad headache.  You start to feel confused.  You feel weak or numb.  You feel faint.  You get very bad pain in your: ? Chest. ? Belly (abdomen).  You throw up (vomit) more than once.  You have trouble breathing. Summary  Hypertension is another name for high blood pressure.  Making healthy choices can help lower blood pressure. If your blood pressure cannot be controlled with healthy choices, you may need to take medicine. This information is not intended to replace advice given to you by your health care provider. Make sure you discuss any questions you have with your health care provider. Document Released: 03/13/2008 Document Revised: 08/23/2016 Document Reviewed: 08/23/2016 Elsevier Interactive Patient Education  2018 [employer]   Steps to Quit Smoking Smoking tobacco can be bad for your health. It can also affect almost every organ in your body. Smoking puts you and people around you at risk for many serious long-lasting (chronic) diseases. Quitting smoking is hard, but it is one of the best things that you can do for your health. It is never too late to  quit. What are the benefits of quitting smoking? When you quit smoking, you lower your risk for getting serious diseases and conditions. They can include:  Lung cancer or lung disease.  Heart disease.  Stroke.  Heart attack.  Not being able to have children (infertility).  Weak bones (osteoporosis) and broken bones (fractures).  If you have coughing, wheezing, and shortness of breath, those symptoms may get better when you quit. You may also get sick less often. If you are pregnant, quitting smoking can help to lower your chances of having a baby of low birth weight. What can I do to help me quit smoking? Talk with your doctor about what can help you quit smoking. Some things you can do (strategies) include:  Quitting smoking totally, instead of slowly cutting back how much you smoke over a period of time.  Going to in-person counseling. You are more likely to quit if you go to many counseling sessions.  Using resources and support systems, such as: ? Agricultural engineernline chats with a Veterinary surgeoncounselor. ? Phone quitlines. ? Automotive engineerrinted self-help materials. ? Support groups or group counseling. ? Text messaging programs. ? Mobile phone apps or applications.  Taking medicines. Some of these medicines may have nicotine in them. If you are pregnant or breastfeeding, do not take any medicines to quit smoking unless your doctor says it is okay. Talk with your doctor about counseling or other things that can help you.  Talk with your doctor about using more than one strategy at the same time, such as taking medicines while you are also going to in-person counseling. This can help make quitting easier. What things can I do to make it easier to quit? Quitting smoking might feel very hard at first, but there is a lot that you can do to make it easier. Take these steps:  Talk to your family and friends. Ask them to support and encourage you.  Call phone quitlines, reach out to support groups, or work with a  Veterinary surgeoncounselor.  Ask people who smoke to not smoke around you.  Avoid places that make you want (trigger) to smoke, such as: ? Bars. ? Parties. ? Smoke-break areas at work.  Spend time with people who do not smoke.  Lower the stress in your life. Stress can make you want to smoke. Try these things to help your stress: ? Getting regular  exercise. ? Deep-breathing exercises. ? Yoga. ? Meditating. ? Doing a body scan. To do this, close your eyes, focus on one area of your body at a time from head to toe, and notice which parts of your body are tense. Try to relax the muscles in those areas.  Download or buy apps on your mobile phone or tablet that can help you stick to your quit plan. There are many free apps, such as QuitGuide from the [employer] developer for Disease Control and Prevention). You can find more support from smokefree.gov and other websites.  This information is not intended to replace advice given to you by your health care provider. Make sure you discuss any questions you have with your health care provider. Document Released: 07/22/2009 Document Revised: 05/23/2016 Document Reviewed: 02/09/2015 Elsevier Interactive Patient Education  2018 [employer].

## 2017-04-26 NOTE — Progress Notes (Signed)
Following documentation of urine out put of 4L over the past 8 hrs urine specific gravity test was performed.  Results : [id number]

## 2017-04-26 NOTE — Progress Notes (Signed)
ANTICOAGULATION CONSULT NOTE   Pharmacy Consult for heparin Indication: s/p left ICA stent placement  Allergies  Allergen Reactions  . Nsaids Other (See Comments)    Causes ulcers to bleed  . Morphine And Related Itching    Patient Measurements: Height: 5\' 6"  (167.6 cm) Weight: 180 lb 12.4 oz (82 kg) IBW/kg (Calculated) : 59.3 Heparin Dosing Weight: 76 kg  Vital Signs: Temp: 98.4 F (36.9 C) (07/19 0000) Temp Source: Oral (07/19 0000) BP: 140/85 (07/19 0230) Pulse Rate: 75 (07/19 0244)  Labs:  Recent Labs  04/25/17 0700 04/25/17 1909 04/26/17 0230 04/26/17 0231  HGB 13.7  --  11.9*  --   HCT 40.9  --  37.4  --   PLT 338  --  328  --   APTT 32  --   --   --   LABPROT 14.4  --   --   --   INR 1.11  --   --   --   HEPARINUNFRC  --  <0.10*  --  <0.10*  CREATININE 0.77  --  0.58  --     Estimated Creatinine Clearance: 96.9 mL/min (by C-G formula based on SCr of 0.58 mg/dL).   Medical History: Past Medical History:  Diagnosis Date  . Abnormal weight gain   . Anxiety   . Chronic back pain   . Controlled substance agreement terminated 07/2014   [facility]  . Depression   . Fibromyalgia   . GERD (gastroesophageal reflux disease)   . Headache   . Hirsutism   . Hypertension   . Hypokalemia   . Neutrophilic leukocytosis   . Periodontal disease   . Stomach ulcer   . Stroke (HCC)   . 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 She is s/p left ICA stenting. Pharmacy consulted to manage heparin drip until this morning at 0700. Hgb with slight downtrend, but stable and platelets normal. No s/s bleeding noted.   Heparin level remains undetectable with no issues per nursing.   Goal of Therapy:  Heparin level 0.1-0.25 units/ml Monitor platelets by anticoagulation protocol: Yes   Plan:  - Increase heparin drip  to 900 units/hr - Monitor for s/sx of bleeding - Heparin drip to be turned off at 7:00 on 7/19   York CeriseKatherine Cook, PharmD Clinical Pharmacist 04/26/17 3:14 AM

## 2017-04-27 ENCOUNTER — Other Ambulatory Visit (HOSPITAL_COMMUNITY): Payer: Self-pay | Admitting: Interventional Radiology

## 2017-04-27 DIAGNOSIS — I7771 Dissection of carotid artery: Secondary | ICD-10-CM

## 2017-05-01 ENCOUNTER — Encounter (HOSPITAL_COMMUNITY): Payer: Self-pay | Admitting: Interventional Radiology

## 2017-05-09 ENCOUNTER — Ambulatory Visit (HOSPITAL_COMMUNITY)

## 2017-05-14 ENCOUNTER — Encounter (HOSPITAL_COMMUNITY): Payer: Self-pay

## 2017-05-14 ENCOUNTER — Ambulatory Visit (HOSPITAL_COMMUNITY)
Admission: RE | Admit: 2017-05-14 | Discharge: 2017-05-14 | Disposition: A | Discharge status: Home / Self Care | Source: Ambulatory Visit | Attending: Interventional Radiology | Admitting: Interventional Radiology

## 2017-05-28 ENCOUNTER — Telehealth (HOSPITAL_COMMUNITY): Payer: Self-pay

## 2017-05-28 NOTE — Telephone Encounter (Signed)
Called to reschedule f/u, left message for pt to return call. AW 

## 2017-06-14 ENCOUNTER — Ambulatory Visit (HOSPITAL_COMMUNITY): Admission: RE | Admit: 2017-06-14 | Source: Ambulatory Visit

## 2017-12-25 ENCOUNTER — Ambulatory Visit: Payer: Self-pay | Admitting: Nurse Practitioner

## 2018-03-12 ENCOUNTER — Other Ambulatory Visit: Payer: Self-pay

## 2018-03-12 ENCOUNTER — Emergency Department
Admission: EM | Admit: 2018-03-12 | Discharge: 2018-03-12 | Disposition: A | Discharge status: Home / Self Care | Attending: Emergency Medicine | Admitting: Emergency Medicine

## 2018-03-12 ENCOUNTER — Encounter: Payer: Self-pay | Admitting: Emergency Medicine

## 2018-03-12 DIAGNOSIS — I10 Essential (primary) hypertension: Secondary | ICD-10-CM | POA: Insufficient documentation

## 2018-03-12 DIAGNOSIS — Z79899 Other long term (current) drug therapy: Secondary | ICD-10-CM | POA: Diagnosis not present

## 2018-03-12 DIAGNOSIS — Z87891 Personal history of nicotine dependence: Secondary | ICD-10-CM | POA: Diagnosis not present

## 2018-03-12 DIAGNOSIS — K047 Periapical abscess without sinus: Secondary | ICD-10-CM | POA: Diagnosis not present

## 2018-03-12 DIAGNOSIS — Z7982 Long term (current) use of aspirin: Secondary | ICD-10-CM | POA: Diagnosis not present

## 2018-03-12 DIAGNOSIS — R600 Localized edema: Secondary | ICD-10-CM | POA: Diagnosis present

## 2018-03-12 LAB — CBC WITH DIFFERENTIAL/PLATELET
Basophils Absolute: 0 10*3/uL (ref 0–0.1)
Basophils Relative: 0 %
EOS ABS: 0.2 10*3/uL (ref 0–0.7)
Eosinophils Relative: 3 %
HCT: 35.8 % (ref 35.0–47.0)
Hemoglobin: 12.3 g/dL (ref 12.0–16.0)
LYMPHS ABS: 1.1 10*3/uL (ref 1.0–3.6)
LYMPHS PCT: 12 %
MCH: 29.4 pg (ref 26.0–34.0)
MCHC: 34.3 g/dL (ref 32.0–36.0)
MCV: 85.7 fL (ref 80.0–100.0)
MONO ABS: 0.6 10*3/uL (ref 0.2–0.9)
MONOS PCT: 6 %
Neutro Abs: 7.8 10*3/uL — ABNORMAL HIGH (ref 1.4–6.5)
Neutrophils Relative %: 79 %
Platelets: 285 10*3/uL (ref 150–440)
RBC: 4.18 MIL/uL (ref 3.80–5.20)
RDW: 15.8 % — ABNORMAL HIGH (ref 11.5–14.5)
WBC: 9.8 10*3/uL (ref 3.6–11.0)

## 2018-03-12 LAB — COMPREHENSIVE METABOLIC PANEL
ALT: 14 U/L (ref 14–54)
ANION GAP: 9 (ref 5–15)
AST: 26 U/L (ref 15–41)
Albumin: 3.5 g/dL (ref 3.5–5.0)
Alkaline Phosphatase: 65 U/L (ref 38–126)
BUN: 5 mg/dL — ABNORMAL LOW (ref 6–20)
CALCIUM: 8.7 mg/dL — AB (ref 8.9–10.3)
CO2: 24 mmol/L (ref 22–32)
CREATININE: 0.73 mg/dL (ref 0.44–1.00)
Chloride: 103 mmol/L (ref 101–111)
Glucose, Bld: 127 mg/dL — ABNORMAL HIGH (ref 65–99)
Potassium: 3.4 mmol/L — ABNORMAL LOW (ref 3.5–5.1)
Sodium: 136 mmol/L (ref 135–145)
Total Bilirubin: 0.5 mg/dL (ref 0.3–1.2)
Total Protein: 7 g/dL (ref 6.5–8.1)

## 2018-03-12 MED ORDER — AMOXICILLIN 500 MG PO TABS: 500.0000 mg | ORAL_TABLET | Freq: Three times a day (TID) | ORAL | 0 refills | Status: DC

## 2018-03-12 MED ORDER — OXYCODONE HCL 5 MG PO TABS
5.0000 mg | ORAL_TABLET | Freq: Once | ORAL | Status: AC
  Administered 2018-03-12: 5 mg via ORAL
  Filled 2018-03-12: qty 1

## 2018-03-12 MED ORDER — HYDROCODONE-ACETAMINOPHEN 5-325 MG PO TABS: 1.0000 | ORAL_TABLET | Freq: Four times a day (QID) | ORAL | 0 refills | Status: AC | PRN

## 2018-03-12 MED ORDER — CEFTRIAXONE SODIUM 1 G IJ SOLR
1.0000 g | Freq: Once | INTRAMUSCULAR | Status: AC
  Administered 2018-03-12: 1 g via INTRAMUSCULAR
  Filled 2018-03-12: qty 10

## 2018-03-12 MED ORDER — LIDOCAINE HCL (PF) 1 % IJ SOLN
1.2000 mL | Freq: Once | INTRAMUSCULAR | Status: AC
  Administered 2018-03-12: 2.1 mL
  Filled 2018-03-12: qty 5

## 2018-03-12 NOTE — Discharge Instructions (Signed)
Rinse with warm salt water 4 times per day. Please call and schedule a dental appointment as soon as possible. You will need to be seen within the next 14 days. Return to the emergency department for symptoms that change or worsen if you're unable to schedule an appointment.  OPTIONS FOR DENTAL FOLLOW UP CARE  [place] [facility] - Local Safety Net Dental Clinics [email or web address]   [facility] [phone])  [facility]arl Ma [phone])  [place] [phone])  [facility] [phone])  [facility] [phone]) This clinic caters to the indigent population and is on a lottery system. Location: [employer] of Dentistry, [employer], [address], [place] Clinic Hours: Wednesdays from 6pm - 9pm, patients seen by a lottery system. For dates, call or go to [email or web address] Services: Cleanings, fillings and simple extractions. Payment Options: DENTAL WORK IS FREE OF CHARGE. Bring proof of income or support. Best way to get seen: Arrive at 5:15 pm - this is a lottery, NOT first come/first serve, so arriving earlier will not increase your chances of being seen.     [facility] [phone] Select option 1 for emergencies   Location: [facility] Dentistry, [place], [address] [place] Clinic Hours: No walk-ins accepted - call the day before to schedule an appointment. Check in times are 9:30 am and 1:30 pm. Services: Simple extractions, temporary fillings, pulpectomy/pulp debridement, uncomplicated abscess drainage. Payment Options: PAYMENT IS DUE AT THE TIME OF SERVICE.  Fee is usually $100-200, additional surgical procedures (e.g. abscess drainage) may be extra. Cash, checks, Visa/MasterCard accepted.  Can file Medicaid if patient is covered for dental -  patient should call case worker to check. No discount for [facility] patients. Best way to get seen: MUST call the day before and get onto the schedule. Can usually be seen the next 1-2 days. No walk-ins accepted.     [facility] [phone]   Location: [facility], [address] [place] Clinic Hours: M, W, Th, F 8am or 1:30pm, Tues 9a or 1:30 - first come/first served. Services: Simple extractions, temporary fillings, uncomplicated abscess drainage.  You do not need to be an [facility] resident. Payment Options: PAYMENT IS DUE AT THE TIME OF SERVICE. Dental insurance, otherwise sliding scale - bring proof of income or support. Depending on income and treatment needed, cost is usually $50-200. Best way to get seen: Arrive early as it is first come/first served.     [facility] [phone]   Location: [address] Clinic Hours: Mon-Thu 8a-5p Services: Most basic dental services including extractions and fillings. Payment Options: PAYMENT IS DUE AT THE TIME OF SERVICE. Sliding scale, up to 50% off - bring proof if income or support. Medicaid with dental option accepted. Best way to get seen: Call to schedule an appointment, can usually be seen within 2 weeks OR they will try to see walk-ins - show up at 8a or 2p (you may have to wait).     [facility] [phone] [place] RESIDENTS ONLY   Location: [facility], [address] [place], [place] [address] Clinic Hours: By appointment only. Monday - Thursday 8am-5pm, Friday 8am-12pm Services: Cleanings, fillings, extractions. Payment Options: PAYMENT IS DUE AT THE TIME OF SERVICE. Cash, Visa or MasterCard. Sliding scale - $30 minimum per service. Best way to get seen: Come in  to office, complete packet and make an appointment - need proof of income or support monies for each household  member and proof of [place] residence. Usually takes about a month to get in.     [facility] [phone]   Location: [address] [facility] Hours: Walk-in Urgent Care Dental Services are offered Monday-Friday mornings only. The numbers of emergencies accepted daily is limited to the number of providers available. Maximum 15 - Mondays, Wednesdays & Thursdays Maximum 10 - Tuesdays & Fridays Services: You do not need to be a [place] resident to be seen for a dental emergency. Emergencies are defined as pain, swelling, abnormal bleeding, or dental trauma. Walkins will receive x-rays if needed. NOTE: Dental cleaning is not an emergency. Payment Options: PAYMENT IS DUE AT THE TIME OF SERVICE. Minimum co-pay is $40.00 for uninsured patients. Minimum co-pay is $3.00 for Medicaid with dental coverage. Dental Insurance is accepted and must be presented at time of visit. Medicare does not cover dental. Forms of payment: Cash, credit card, checks. Best way to get seen: If not previously registered with the clinic, walk-in dental registration begins at 7:15 am and is on a first come/first serve basis. If previously registered with the clinic, call to make an appointment.     [facility] [phone] [place] RESIDENTS ONLY   Location: [address], [place], [place] Clinic Hours: Mon-Thu 10a-2p Services: Extractions only! Payment Options: FREE (donations accepted) - bring proof of income or support Best way to get seen: Call and schedule an appointment OR come at 8am on the 1st Monday of every month (except for holidays) when it is first come/first served.     [facility] [phone]   Location: [address], [place] Clinic Hours: Friday mornings Services, Payment Options, Best way to get seen: Call for info

## 2018-03-12 NOTE — ED Notes (Signed)
Pt has been stuck another two times unsuccessfully. Lab has been notified and will send someone to draw pt blood.

## 2018-03-12 NOTE — ED Notes (Signed)
Attempted x 2 for lab draw, unsuccessful.

## 2018-03-12 NOTE — ED Provider Notes (Signed)
[facility] Emergency Department Provider Note ____________________________________________  Time seen: Approximately 3:34 PM  I have reviewed the triage vital signs and the nursing notes.   HISTORY  Chief Complaint Facial Swelling   HPI Latasha Lawrence is a 47 y.o. female who presents to the emergency department for treatment and evaluation of right lower jaw swelling that started yesterday.  She stated that it became painful yesterday and noticed that she had some mild swelling well overnight and throughout the day it has increased in size and the pain has become worse.  Patient has not taken any medications or attempted any alleviating measures for this complaint prior to arrival.   Past Medical History:  Diagnosis Date  . Abnormal weight gain   . Anxiety   . Chronic back pain   . Controlled substance agreement terminated 07/2014   [facility]  . Depression   . Fibromyalgia   . GERD (gastroesophageal reflux disease)   . Headache   . Hirsutism   . Hypertension   . Hypokalemia   . Neutrophilic leukocytosis   . Periodontal disease   . Stomach ulcer   . Stroke (HCC)   . Vitamin D deficiency disease     Patient Active Problem List   Diagnosis Date Noted  . Internal carotid artery stenosis 04/26/2017  . Carotid stenosis, symptomatic, with infarction (HCC) 04/25/2017  . Cerebral embolus 04/25/2017  . Acute ischemic stroke (HCC) - Stroke: acute and sub-acute scattered cortical infarcts involving the left MCA, very likely embolic in the setting of   ICA stenosis. Underlying fibromuscular dysplasia  04/18/2017  . Hypertension   . Fibromyalgia   . Vitamin D deficiency disease   . Hirsutism   . Hypokalemia   . Neutrophilic leukocytosis   . Periodontal disease   . Abnormal weight gain   . Chronic back pain     Past Surgical History:  Procedure Laterality Date  . Arm Surgery    . CESAREAN SECTION    . IR ANGIO INTRA  EXTRACRAN SEL COM CAROTID INNOMINATE BILAT MOD SED  04/19/2017  . IR ANGIO VERTEBRAL SEL VERTEBRAL BILAT MOD SED  04/19/2017  . IR INTRAVSC STENT CERV CAROTID W/O EMB-PROT MOD SED INC ANGIO  04/25/2017  . RADIOLOGY WITH ANESTHESIA Left 04/25/2017   Procedure: RADIOLOGY WITH ANESTHESIA LEFT CARDIAC  STENT;  Surgeon: Julieanne Cottoneveshwar, Sanjeev, MD;  Location: [facility] OR;  Service: Radiology;  Laterality: Left;    Prior to Admission medications   Medication Sig Start Date End Date Taking? Authorizing Provider  amoxicillin (AMOXIL) 500 MG tablet Take 1 tablet (500 mg total) by mouth 3 (three) times daily. 03/12/18   Anadia Helmes, Rulon Eisenmengerari B, FNP  aspirin EC 325 MG EC tablet Take 1 tablet (325 mg total) by mouth daily. 04/20/17   Patteson, Paul DykesSamuel A, NP  atorvastatin (LIPITOR) 80 MG tablet Take 1 tablet (80 mg total) by mouth daily at 6 PM. 04/20/17   Patteson, Paul DykesSamuel A, NP  clopidogrel (PLAVIX) 75 MG tablet Take 1 tablet (75 mg total) by mouth daily. 04/20/17   Patteson, Paul DykesSamuel A, NP  famotidine (PEPCID) 10 MG tablet Take 4 tablets (40 mg total) by mouth daily. 04/20/17   Patteson, Paul DykesSamuel A, NP  gabapentin (NEURONTIN) 300 MG capsule Take 3 capsules by mouth at bedtime. 05/23/13   [provider]  HYDROcodone-acetaminophen (NORCO/VICODIN) 5-325 MG tablet Take 1 tablet by mouth every 6 (six) hours as needed for up to 3 days for severe pain.  03/12/18 03/15/18  Ardella Chhim, Rulon Eisenmenger B, FNP  venlafaxine (EFFEXOR) 75 MG tablet Take 75 mg by mouth 2 (two) times daily.    [provider]    Allergies Nsaids and Morphine and related  Family History  Problem Relation Age of Onset  . Arthritis Mother   . Cancer Mother        breast  . Mental illness Mother   . Thyroid disease Mother   . Hyperlipidemia Father   . Hypertension Father     Social History Social History   Tobacco Use  . Smoking status: Former Smoker    Packs/day: 0.00    Last attempt to quit: 04/19/2017    Years since quitting: 0.8  . Smokeless tobacco:  Never Used  Substance Use Topics  . Alcohol use: No  . Drug use: No    Review of Systems Constitutional: Positive for dental pain.  Negative for fever. ENT: Positive for dental pain Musculoskeletal: Negative for trismus of the jaw Skin: Positive for facial swelling ____________________________________________   PHYSICAL EXAM:  VITAL SIGNS: ED Triage Vitals [03/12/18 1151]  Enc Vitals Group     BP 135/70     Pulse Rate 81     Resp 18     Temp 98.2 F (36.8 C)     Temp Source Oral     SpO2 98 %     Weight 190 lb (86.2 kg)     Height 5\' 6"  (1.676 m)     Head Circumference      Peak Flow      Pain Score 8     Pain Loc      Pain Edu?      Excl. in GC?     Constitutional: Alert and oriented. Well appearing and in no acute distress. Eyes: No conjunctival discharge or drainage. Mouth/Throat: Airway is patent.  No edema of the tongue.  Sublingual surface is soft and moist.  Multiple areas of dental decay in the right lower jaw identified on exam.  No pinpoint area of fluctuance or drainage was noted. Periodontal Exam Respiratory: Respirations even and unlabored. Musculoskeletal: Range of motion of the jaw is limited secondary to facial swelling Neurologic: Awake, alert, oriented x4. Skin: No erythema is noted in the area of facial swelling. Psychiatric: Affect and behavior are appropriate.  ____________________________________________   LABS (all labs ordered are listed, but only abnormal results are displayed)  Labs Reviewed  CBC WITH DIFFERENTIAL/PLATELET - Abnormal; Notable for the following components:      Result Value   RDW 15.8 (*)    Neutro Abs 7.8 (*)    All other components within normal limits  COMPREHENSIVE METABOLIC PANEL - Abnormal; Notable for the following components:   Potassium 3.4 (*)    Glucose, Bld 127 (*)    BUN 5 (*)    Calcium 8.7 (*)    All other components within normal limits    ____________________________________________   RADIOLOGY  Not indicated who presents to the emergency department for treatment and evaluation of dental abscess.  She was given 1 g of Rocephin IM while here. ____________________________________________   PROCEDURES  Procedure(s) performed:   Procedures  Critical Care performed: No ____________________________________________   INITIAL IMPRESSION / ASSESSMENT AND PLAN / ED COURSE  TONIKA EDEN is a 46 y.o. female she will be started on amoxicillin and given Norco for pain management for the next couple of days.  She was given a list of dental clinics available for sliding  scale she was instructed to return to the emergency department immediately if she feels that the facial swelling has gotten worse or she is unable to swallow normally.  Pertinent labs & imaging results that were available during my care of the patient were reviewed by me and considered in my medical decision making (see chart for details).  ____________________________________________   FINAL CLINICAL IMPRESSION(S) / ED DIAGNOSES  Final diagnoses:  Dental abscess    Discharge Medication List as of 03/12/2018  2:13 PM    START taking these medications   Details  amoxicillin (AMOXIL) 500 MG tablet Take 1 tablet (500 mg total) by mouth 3 (three) times daily., Starting Tue 03/12/2018, Print    HYDROcodone-acetaminophen (NORCO/VICODIN) 5-325 MG tablet Take 1 tablet by mouth every 6 (six) hours as needed for up to 3 days for severe pain., Starting Tue 03/12/2018, Until Fri 03/15/2018, Print        If controlled substance prescribed during this visit, 12 month history viewed on the [facility] prior to issuing an initial prescription for Schedule II or III opiod.  Note:  This document was prepared using Dragon voice recognition software and may include unintentional dictation errors.     Chinita Pester, FNP 03/12/18 1538    Jene Every, MD 03/14/18  Flossie Buffy

## 2018-03-12 NOTE — ED Triage Notes (Signed)
Pt arrived via POV with reports of right facial swelling that started yesterday, pt has poor dentition. No redness noted.  Pt states she has been taking Tylenol last dose was around 10am today.  Pt states she is unable to chew on the right side.  Pt states she has never had swelling related to dental hygiene this bad in the past.

## 2018-03-12 NOTE — ED Notes (Signed)
See triage note  States she has bad teeth but not sure if facial swelling is coming from a bad tooth    Developed some pain with min swelling yesterday  Swelling increased this am

## 2018-07-16 ENCOUNTER — Ambulatory Visit: Payer: Self-pay | Admitting: Family Medicine

## 2018-07-22 ENCOUNTER — Ambulatory Visit: Payer: Self-pay | Admitting: Nurse Practitioner

## 2019-04-15 ENCOUNTER — Telehealth: Payer: Self-pay | Admitting: *Deleted

## 2019-04-15 DIAGNOSIS — Z20822 Contact with and (suspected) exposure to covid-19: Secondary | ICD-10-CM

## 2019-04-15 NOTE — Telephone Encounter (Signed)
Pt scheduled for covid testing tomorrow at [place] [facility].   Requested by [facility].  Pt with possible exposure.  Testing process reviewed, wear mask, stay in car, verbalizes understanding.

## 2019-04-16 ENCOUNTER — Other Ambulatory Visit

## 2019-04-16 DIAGNOSIS — Z20822 Contact with and (suspected) exposure to covid-19: Secondary | ICD-10-CM

## 2019-04-21 LAB — NOVEL CORONAVIRUS, NAA: SARS-CoV-2, NAA: NOT DETECTED

## 2019-06-27 ENCOUNTER — Emergency Department
Admission: EM | Admit: 2019-06-27 | Discharge: 2019-06-27 | Disposition: A | Discharge status: Home / Self Care | Attending: Emergency Medicine | Admitting: Emergency Medicine

## 2019-06-27 ENCOUNTER — Encounter: Payer: Self-pay | Admitting: *Deleted

## 2019-06-27 ENCOUNTER — Other Ambulatory Visit: Payer: Self-pay

## 2019-06-27 ENCOUNTER — Emergency Department

## 2019-06-27 DIAGNOSIS — Z7901 Long term (current) use of anticoagulants: Secondary | ICD-10-CM | POA: Insufficient documentation

## 2019-06-27 DIAGNOSIS — Z79899 Other long term (current) drug therapy: Secondary | ICD-10-CM | POA: Insufficient documentation

## 2019-06-27 DIAGNOSIS — Z8673 Personal history of transient ischemic attack (TIA), and cerebral infarction without residual deficits: Secondary | ICD-10-CM | POA: Insufficient documentation

## 2019-06-27 DIAGNOSIS — F17210 Nicotine dependence, cigarettes, uncomplicated: Secondary | ICD-10-CM | POA: Diagnosis not present

## 2019-06-27 DIAGNOSIS — R519 Headache, unspecified: Secondary | ICD-10-CM

## 2019-06-27 DIAGNOSIS — R51 Headache: Secondary | ICD-10-CM | POA: Diagnosis present

## 2019-06-27 DIAGNOSIS — I10 Essential (primary) hypertension: Secondary | ICD-10-CM | POA: Insufficient documentation

## 2019-06-27 DIAGNOSIS — Z7982 Long term (current) use of aspirin: Secondary | ICD-10-CM | POA: Insufficient documentation

## 2019-06-27 LAB — CBC
HCT: 46.7 % — ABNORMAL HIGH (ref 36.0–46.0)
Hemoglobin: 15.4 g/dL — ABNORMAL HIGH (ref 12.0–15.0)
MCH: 27.5 pg (ref 26.0–34.0)
MCHC: 33 g/dL (ref 30.0–36.0)
MCV: 83.4 fL (ref 80.0–100.0)
Platelets: 373 10*3/uL (ref 150–400)
RBC: 5.6 MIL/uL — ABNORMAL HIGH (ref 3.87–5.11)
RDW: 14.7 % (ref 11.5–15.5)
WBC: 9.9 10*3/uL (ref 4.0–10.5)
nRBC: 0 % (ref 0.0–0.2)

## 2019-06-27 LAB — COMPREHENSIVE METABOLIC PANEL
ALT: 24 U/L (ref 0–44)
AST: 18 U/L (ref 15–41)
Albumin: 4.5 g/dL (ref 3.5–5.0)
Alkaline Phosphatase: 67 U/L (ref 38–126)
Anion gap: 14 (ref 5–15)
BUN: 7 mg/dL (ref 6–20)
CO2: 25 mmol/L (ref 22–32)
Calcium: 9.5 mg/dL (ref 8.9–10.3)
Chloride: 102 mmol/L (ref 98–111)
Creatinine, Ser: 0.79 mg/dL (ref 0.44–1.00)
GFR calc Af Amer: >60 mL/min (ref >60)
GFR calc non Af Amer: >60 mL/min (ref >60)
Glucose, Bld: 127 mg/dL — ABNORMAL HIGH (ref 70–99)
Potassium: 3.8 mmol/L (ref 3.5–5.1)
Sodium: 141 mmol/L (ref 135–145)
Total Bilirubin: 0.6 mg/dL (ref 0.3–1.2)
Total Protein: 8.7 g/dL — ABNORMAL HIGH (ref 6.5–8.1)

## 2019-06-27 LAB — DIFFERENTIAL
Abs Immature Granulocytes: 0.04 10*3/uL (ref 0.00–0.07)
Basophils Absolute: 0 10*3/uL (ref 0.0–0.1)
Basophils Relative: 0 %
Eosinophils Absolute: 0 10*3/uL (ref 0.0–0.5)
Eosinophils Relative: 0 %
Immature Granulocytes: 0 %
Lymphocytes Relative: 16 %
Lymphs Abs: 1.6 10*3/uL (ref 0.7–4.0)
Monocytes Absolute: 0.6 10*3/uL (ref 0.1–1.0)
Monocytes Relative: 6 %
Neutro Abs: 7.6 10*3/uL (ref 1.7–7.7)
Neutrophils Relative %: 78 %

## 2019-06-27 LAB — PROTIME-INR
INR: 1 (ref 0.8–1.2)
Prothrombin Time: 13.4 seconds (ref 11.4–15.2)

## 2019-06-27 LAB — APTT: aPTT: 25 seconds (ref 24–36)

## 2019-06-27 MED ORDER — LACTATED RINGERS IV BOLUS: 1000.0000 mL | Freq: Once | INTRAVENOUS | Status: DC

## 2019-06-27 MED ORDER — SODIUM CHLORIDE 0.9% FLUSH: 3.0000 mL | Freq: Once | INTRAVENOUS | Status: DC

## 2019-06-27 MED ORDER — DIPHENHYDRAMINE HCL 50 MG/ML IJ SOLN
25.0000 mg | Freq: Once | INTRAMUSCULAR | Status: AC
  Administered 2019-06-27: 25 mg via INTRAVENOUS
  Filled 2019-06-27: qty 1

## 2019-06-27 MED ORDER — PROCHLORPERAZINE EDISYLATE 10 MG/2ML IJ SOLN
10.0000 mg | Freq: Once | INTRAMUSCULAR | Status: AC
  Administered 2019-06-27: 22:00:00 10 mg via INTRAVENOUS
  Filled 2019-06-27: qty 2

## 2019-06-27 MED ORDER — PROCHLORPERAZINE MALEATE 10 MG PO TABS: 10.0000 mg | ORAL_TABLET | Freq: Four times a day (QID) | ORAL | 0 refills | Status: DC | PRN

## 2019-06-27 MED ORDER — KETOROLAC TROMETHAMINE 30 MG/ML IJ SOLN
15.0000 mg | Freq: Once | INTRAMUSCULAR | Status: AC
  Administered 2019-06-27: 23:00:00 15 mg via INTRAVENOUS
  Filled 2019-06-27: qty 1

## 2019-06-27 NOTE — ED Triage Notes (Signed)
Pt presents w/ c/o worst headache of her life, sxs started x 3 days ago. Pt has PMH of stroke. Pt has no PCP. R facial droop. Pt had R facial droop from prior stroke that resolved. Pt has weaker R pronator and dosiflexion in R foot. Negative pronator drift in arms.

## 2019-06-27 NOTE — ED Triage Notes (Addendum)
First nurse note: Per EMS report, patient c/o headache for 3 days, nausea. Patient has a history of carotid stents and CVA. Negative stroke screen per EMS. No deficits from previous CVAs. Blood pressure 194/120, 99%, 88bpm.

## 2019-06-27 NOTE — ED Notes (Signed)
Attempt iv without success.  

## 2019-06-27 NOTE — ED Provider Notes (Signed)
[facility] Emergency Department Provider Note   ____________________________________________   First MD Initiated Contact with Patient 06/27/19 2026     (approximate)  I have reviewed the triage vital signs and the nursing notes.   HISTORY  Chief Complaint Headache    HPI Latasha Lawrence is a 48 y.o. female with past medical history of stroke and chronic pain presents to the ED complaining of headache.  Patient reports she has had 3 days of gradually worsening pain that affects the entirety of her head.  She describes it as a severe throbbing and denies any history of similar headaches.  She has not had any fevers and denies any neck stiffness.  She also denies any numbness or weakness, has not noticed any vision or speech changes.  She has been taking Tylenol without relief.  Headache is exacerbated by bright lights and patient has been feeling nauseous, reports one episode of vomiting this morning.        Past Medical History:  Diagnosis Date  . Abnormal weight gain   . Anxiety   . Chronic back pain   . Controlled substance agreement terminated 07/2014   failed UDS at [facility]  . Depression   . Fibromyalgia   . GERD (gastroesophageal reflux disease)   . Headache   . Hirsutism   . Hypertension   . Hypokalemia   . Neutrophilic leukocytosis   . Periodontal disease   . Stomach ulcer   . Stroke (HCC)   . Vitamin D deficiency disease     Patient Active Problem List   Diagnosis Date Noted  . Internal carotid artery stenosis 04/26/2017  . Carotid stenosis, symptomatic, with infarction (HCC) 04/25/2017  . Cerebral embolus 04/25/2017  . Acute ischemic stroke (HCC) - Stroke: acute and sub-acute scattered cortical infarcts involving the left MCA, very likely embolic in the setting of   ICA stenosis. Underlying fibromuscular dysplasia  04/18/2017  . Hypertension   . Fibromyalgia   . Vitamin D deficiency disease   . Hirsutism   .  Hypokalemia   . Neutrophilic leukocytosis   . Periodontal disease   . Abnormal weight gain   . Chronic back pain     Past Surgical History:  Procedure Laterality Date  . Arm Surgery    . CESAREAN SECTION    . IR ANGIO INTRA EXTRACRAN SEL COM CAROTID INNOMINATE BILAT MOD SED  04/19/2017  . IR ANGIO VERTEBRAL SEL VERTEBRAL BILAT MOD SED  04/19/2017  . IR INTRAVSC STENT CERV CAROTID W/O EMB-PROT MOD SED INC ANGIO  04/25/2017  . RADIOLOGY WITH ANESTHESIA Left 04/25/2017   Procedure: RADIOLOGY WITH ANESTHESIA LEFT CARDIAC  STENT;  Surgeon: Julieanne Cotton, MD;  Location: [facility] OR;  Service: Radiology;  Laterality: Left;    Prior to Admission medications   Medication Sig Start Date End Date Taking? Authorizing Provider  amoxicillin (AMOXIL) 500 MG tablet Take 1 tablet (500 mg total) by mouth 3 (three) times daily. 03/12/18   Triplett, Rulon Eisenmenger B, FNP  aspirin EC 325 MG EC tablet Take 1 tablet (325 mg total) by mouth daily. 04/20/17   Patteson, Paul Dykes, NP  atorvastatin (LIPITOR) 80 MG tablet Take 1 tablet (80 mg total) by mouth daily at 6 PM. 04/20/17   Patteson, Paul Dykes, NP  clopidogrel (PLAVIX) 75 MG tablet Take 1 tablet (75 mg total) by mouth daily. 04/20/17   Patteson, Paul Dykes, NP  famotidine (PEPCID) 10 MG tablet Take 4 tablets (40 mg total)  by mouth daily. 04/20/17   Patteson, Paul DykesSamuel A, NP  gabapentin (NEURONTIN) 300 MG capsule Take 3 capsules by mouth at bedtime. 05/23/13   [provider]  prochlorperazine (COMPAZINE) 10 MG tablet Take 1 tablet (10 mg total) by mouth every 6 (six) hours as needed for nausea or vomiting (Headache). 06/27/19   Chesley NoonJessup, Annastyn Silvey, MD  venlafaxine (EFFEXOR) 75 MG tablet Take 75 mg by mouth 2 (two) times daily.    [provider]    Allergies Nsaids and Morphine and related  Family History  Problem Relation Age of Onset  . Arthritis Mother   . Cancer Mother        breast  . Mental illness Mother   . Thyroid disease Mother   . Hyperlipidemia  Father   . Hypertension Father     Social History Social History   Tobacco Use  . Smoking status: Current Every Day Smoker    Packs/day: 0.00    Last attempt to quit: 04/19/2017    Years since quitting: 2.1  . Smokeless tobacco: Never Used  Substance Use Topics  . Alcohol use: No  . Drug use: No    Review of Systems  Constitutional: No fever/chills Eyes: No visual changes. ENT: No sore throat. Cardiovascular: Denies chest pain. Respiratory: Denies shortness of breath. Gastrointestinal: No abdominal pain.  Positive for nausea and vomiting.  No diarrhea.  No constipation. Genitourinary: Negative for dysuria. Musculoskeletal: Negative for back pain. Skin: Negative for rash. Neurological: Positive for headache, negative for focal weakness or numbness.  ____________________________________________   PHYSICAL EXAM:  VITAL SIGNS: ED Triage Vitals  Enc Vitals Group     BP 06/27/19 1733 (!) 156/102     Pulse Rate 06/27/19 1733 92     Resp 06/27/19 1733 (!) 24     Temp 06/27/19 1733 98.2 F (36.8 C)     Temp Source 06/27/19 1733 Oral     SpO2 06/27/19 1733 96 %     Weight --      Height --      Head Circumference --      Peak Flow --      Pain Score 06/27/19 1734 10     Pain Loc --      Pain Edu? --      Excl. in GC? --     Constitutional: Alert and oriented. Eyes: Conjunctivae are normal. Head: Atraumatic. Nose: No congestion/rhinnorhea. Mouth/Throat: Mucous membranes are moist. Neck: Normal ROM Cardiovascular: Normal rate, regular rhythm. Grossly normal heart sounds. Respiratory: Normal respiratory effort.  No retractions. Lungs CTAB. Gastrointestinal: Soft and nontender. No distention. Genitourinary: deferred Musculoskeletal: No lower extremity tenderness nor edema. Neurologic:  Normal speech and language. No gross focal neurologic deficits are appreciated. Skin:  Skin is warm, dry and intact. No rash noted. Psychiatric: Mood and affect are normal. Speech  and behavior are normal.  ____________________________________________   LABS (all labs ordered are listed, but only abnormal results are displayed)  Labs Reviewed  CBC - Abnormal; Notable for the following components:      Result Value   RBC 5.60 (*)    Hemoglobin 15.4 (*)    HCT 46.7 (*)    All other components within normal limits  COMPREHENSIVE METABOLIC PANEL - Abnormal; Notable for the following components:   Glucose, Bld 127 (*)    Total Protein 8.7 (*)    All other components within normal limits  PROTIME-INR  APTT  DIFFERENTIAL  CBG MONITORING, ED  POC URINE  PREG, ED     PROCEDURES  Procedure(s) performed (including Critical Care):  Procedures   ____________________________________________   INITIAL IMPRESSION / ASSESSMENT AND PLAN / ED COURSE       48 year old female presents to the ED complaining of diffuse gradually worsening headache over the past 3 days.  Head CT negative for acute process.  Given gradual onset, low suspicion for [facility].  No signs or symptoms to suggest meningitis.  Patient has a benign and nonfocal neurologic exam.  She was given migraine cocktail and Toradol with some relief, no apparent emergent etiology of patient's headache.  Counseled patient to follow-up with PCP and to return to the ED for new or worsening symptoms, patient agrees with plan.      ____________________________________________   FINAL CLINICAL IMPRESSION(S) / ED DIAGNOSES  Final diagnoses:  Acute nonintractable headache, unspecified headache type     ED Discharge Orders         Ordered    prochlorperazine (COMPAZINE) 10 MG tablet  Every 6 hours PRN     06/27/19 2328           Note:  This document was prepared using Dragon voice recognition software and may include unintentional dictation errors.   Blake Divine, MD 06/28/19 [phone]

## 2019-06-27 NOTE — ED Notes (Signed)
IV team at bedside 

## 2019-06-27 NOTE — ED Notes (Signed)
Patient in no acute distress at this time. Patient given an update on wait time. Patient verbalizes understanding.

## 2019-08-19 ENCOUNTER — Ambulatory Visit: Payer: Self-pay | Admitting: Physician Assistant

## 2019-08-19 NOTE — Progress Notes (Deleted)
Patient: Latasha Lawrence, Female    DOB: 1971/03/29, 48 y.o.   MRN: [id number] Visit Date: 08/19/2019  Today's Provider: Trey Sailors, PA-C   No chief complaint on file.  Subjective:    New Patient Appointment Latasha Lawrence is a 48 y.o. female who presents today for new patient appointment. She feels {DESC; WELL/FAIRLY WELL/POORLY:18703}. She reports exercising ***. She reports she is sleeping {DESC; WELL/FAIRLY WELL/POORLY:18703}.  ----------------------------------------------------------------- Last mammogram: Last colonoscopy:  Review of Systems  Constitutional: Negative.   HENT: Negative.   Eyes: Negative.   Respiratory: Negative.   Cardiovascular: Negative.   Gastrointestinal: Negative.   Endocrine: Negative.   Genitourinary: Negative.   Musculoskeletal: Negative.   Skin: Negative.   Allergic/Immunologic: Negative.   Neurological: Negative.   Hematological: Negative.   Psychiatric/Behavioral: Negative.     Social History She  reports that she has been smoking. She has been smoking about 0.00 packs per day. She has never used smokeless tobacco. She reports that she does not drink alcohol or use drugs. Social History   Socioeconomic History  . Marital status: Married    Spouse name: Not on file  . Number of children: Not on file  . Years of education: Not on file  . Highest education level: Not on file  Occupational History  . Not on file  Social Needs  . Financial resource strain: Not on file  . Food insecurity    Worry: Not on file    Inability: Not on file  . Transportation needs    Medical: Not on file    Non-medical: Not on file  Tobacco Use  . Smoking status: Current Every Day Smoker    Packs/day: 0.00    Last attempt to quit: 04/19/2017    Years since quitting: 2.3  . Smokeless tobacco: Never Used  Substance and Sexual Activity  . Alcohol use: No  . Drug use: No  . Sexual activity: Yes    Birth control/protection: None  Lifestyle   . Physical activity    Days per week: Not on file    Minutes per session: Not on file  . Stress: Not on file  Relationships  . Social Musician on phone: Not on file    Gets together: Not on file    Attends religious service: Not on file    Active member of club or organization: Not on file    Attends meetings of clubs or organizations: Not on file    Relationship status: Not on file  Other Topics Concern  . Not on file  Social History Narrative  . Not on file    Patient Active Problem List   Diagnosis Date Noted  . Internal carotid artery stenosis 04/26/2017  . Carotid stenosis, symptomatic, with infarction (HCC) 04/25/2017  . Cerebral embolus 04/25/2017  . Acute ischemic stroke (HCC) - Stroke: acute and sub-acute scattered cortical infarcts involving the left MCA, very likely embolic in the setting of   ICA stenosis. Underlying fibromuscular dysplasia  04/18/2017  . Hypertension   . Fibromyalgia   . Vitamin D deficiency disease   . Hirsutism   . Hypokalemia   . Neutrophilic leukocytosis   . Periodontal disease   . Abnormal weight gain   . Chronic back pain     Past Surgical History:  Procedure Laterality Date  . Arm Surgery    . CESAREAN SECTION    . IR ANGIO INTRA EXTRACRAN SEL COM CAROTID  INNOMINATE BILAT MOD SED  04/19/2017  . IR ANGIO VERTEBRAL SEL VERTEBRAL BILAT MOD SED  04/19/2017  . IR INTRAVSC STENT CERV CAROTID W/O EMB-PROT MOD SED INC ANGIO  04/25/2017  . RADIOLOGY WITH ANESTHESIA Left 04/25/2017   Procedure: RADIOLOGY WITH ANESTHESIA LEFT CARDIAC  STENT;  Surgeon: Luanne Bras, MD;  Location: [place];  Service: Radiology;  Laterality: Left;    Family History  Family Status  Relation Name Status  . Mother  Alive  . Father  Alive   Her family history includes Arthritis in her mother; Cancer in her mother; Hyperlipidemia in her father; Hypertension in her father; Mental illness in her mother; Thyroid disease in her mother.     Allergies   Allergen Reactions  . Nsaids Other (See Comments)    Causes ulcers to bleed  . Morphine And Related Itching    Previous Medications   AMOXICILLIN (AMOXIL) 500 MG TABLET    Take 1 tablet (500 mg total) by mouth 3 (three) times daily.   ASPIRIN EC 325 MG EC TABLET    Take 1 tablet (325 mg total) by mouth daily.   ATORVASTATIN (LIPITOR) 80 MG TABLET    Take 1 tablet (80 mg total) by mouth daily at 6 PM.   CLOPIDOGREL (PLAVIX) 75 MG TABLET    Take 1 tablet (75 mg total) by mouth daily.   FAMOTIDINE (PEPCID) 10 MG TABLET    Take 4 tablets (40 mg total) by mouth daily.   GABAPENTIN (NEURONTIN) 300 MG CAPSULE    Take 3 capsules by mouth at bedtime.   PROCHLORPERAZINE (COMPAZINE) 10 MG TABLET    Take 1 tablet (10 mg total) by mouth every 6 (six) hours as needed for nausea or vomiting (Headache).   VENLAFAXINE (EFFEXOR) 75 MG TABLET    Take 75 mg by mouth 2 (two) times daily.    Patient Care Team: Patient, No Pcp Per as PCP - General (General Practice)      Objective:   Vitals: There were no vitals taken for this visit.   Physical Exam   Depression Screen No flowsheet data found.    Assessment & Plan:     Routine Health Maintenance and Physical Exam  Exercise Activities and Dietary recommendations Goals   None     Immunization History  Administered Date(s) Administered  . Tdap 12/17/2013    Health Maintenance  Topic Date Due  . PAP SMEAR-Modifier  05/14/2010  . INFLUENZA VACCINE  05/10/2019  . TETANUS/TDAP  12/18/2023  . HIV Screening  Addressed     Discussed health benefits of physical activity, and encouraged her to engage in regular exercise appropriate for her age and condition.    --------------------------------------------------------------------

## 2020-02-09 ENCOUNTER — Ambulatory Visit: Payer: Self-pay | Admitting: Family Medicine

## 2020-02-19 ENCOUNTER — Ambulatory Visit (INDEPENDENT_AMBULATORY_CARE_PROVIDER_SITE_OTHER): Admitting: Family Medicine

## 2020-02-19 ENCOUNTER — Other Ambulatory Visit: Payer: Self-pay

## 2020-02-19 ENCOUNTER — Encounter: Payer: Self-pay | Admitting: Family Medicine

## 2020-02-19 VITALS — BP 137/85 | HR 101 | Temp 98.5°F | Ht 65.98 in | Wt 222.4 lb

## 2020-02-19 DIAGNOSIS — I639 Cerebral infarction, unspecified: Secondary | ICD-10-CM | POA: Diagnosis not present

## 2020-02-19 DIAGNOSIS — L680 Hirsutism: Secondary | ICD-10-CM

## 2020-02-19 DIAGNOSIS — M797 Fibromyalgia: Secondary | ICD-10-CM

## 2020-02-19 DIAGNOSIS — F419 Anxiety disorder, unspecified: Secondary | ICD-10-CM

## 2020-02-19 DIAGNOSIS — I6529 Occlusion and stenosis of unspecified carotid artery: Secondary | ICD-10-CM

## 2020-02-19 DIAGNOSIS — R232 Flushing: Secondary | ICD-10-CM | POA: Diagnosis not present

## 2020-02-19 DIAGNOSIS — E559 Vitamin D deficiency, unspecified: Secondary | ICD-10-CM | POA: Diagnosis not present

## 2020-02-19 DIAGNOSIS — R0981 Nasal congestion: Secondary | ICD-10-CM | POA: Diagnosis not present

## 2020-02-19 DIAGNOSIS — F329 Major depressive disorder, single episode, unspecified: Secondary | ICD-10-CM

## 2020-02-19 DIAGNOSIS — J3089 Other allergic rhinitis: Secondary | ICD-10-CM

## 2020-02-19 MED ORDER — DULOXETINE HCL 30 MG PO CPEP: 30.0000 mg | ORAL_CAPSULE | Freq: Every day | ORAL | 0 refills | Status: DC

## 2020-02-19 MED ORDER — ATORVASTATIN CALCIUM 80 MG PO TABS: 80.0000 mg | ORAL_TABLET | Freq: Every day | ORAL | 1 refills | Status: DC

## 2020-02-19 MED ORDER — CHANTIX STARTING MONTH PAK 0.5 MG X 11 & 1 MG X 42 PO TABS: ORAL_TABLET | ORAL | 0 refills | Status: DC

## 2020-02-19 MED ORDER — SPIRONOLACTONE 25 MG PO TABS: 25.0000 mg | ORAL_TABLET | Freq: Every day | ORAL | 0 refills | Status: DC

## 2020-02-19 MED ORDER — CLOPIDOGREL BISULFATE 75 MG PO TABS: 75.0000 mg | ORAL_TABLET | Freq: Every day | ORAL | 1 refills | Status: DC

## 2020-02-19 MED ORDER — FLUTICASONE PROPIONATE 50 MCG/ACT NA SUSP: 1.0000 | Freq: Two times a day (BID) | NASAL | 5 refills | Status: DC

## 2020-02-19 NOTE — Progress Notes (Signed)
BP 137/85   Pulse (!) 101   Temp 98.5 F (36.9 C)   Ht 5' 5.98" (1.676 m)   Wt 222 lb 6 oz (100.9 kg)   SpO2 97%   BMI 35.91 kg/m    Subjective:    Patient ID: Latasha Lawrence, female    DOB: Jan 20, 1971, 49 y.o.   MRN: [id number]  HPI: Latasha Lawrence is a 49 y.o. female  Chief Complaint  Patient presents with  . Excessive Sweating    Patient states that she hot all the time  . Abdominal Pain    alternating constipation and diarrhea with cramping   Presenting today to establish care.   Excessive sweating the past year or year and a half. All day every day, not triggered by anything in particular. Mostly feels flushed from neck up.   Hirsutism on chin lifelong but much worse now. Has tried laser hair removal in the past several times without relief. This is very bothersome to her.   CVA in 2018 - took   Fibromyalgia - has tried gabapentin and lyrica which both didn't help with her pain. Currently pain has been tolerable but above average lately.   Moods and anxiety - has been having panic attacks recently which she had been doing fairly well on previously. Was most recently on effexor which didn't seem to help much for her.   About 3 weeks of nasal congestion, sinus pain and pressure. Taking an OTC decongestant which hasn't been helping.   Depression screen [facility] 2/9 02/19/2020  Decreased Interest 0  Down, Depressed, Hopeless 0  PHQ - 2 Score 0  No flowsheet data found.    Relevant past medical, surgical, family and social history reviewed and updated as indicated. Interim medical history since our last visit reviewed. Allergies and medications reviewed and updated.  Review of Systems  Per HPI unless specifically indicated above     Objective:    BP 137/85   Pulse (!) 101   Temp 98.5 F (36.9 C)   Ht 5' 5.98" (1.676 m)   Wt 222 lb 6 oz (100.9 kg)   SpO2 97%   BMI 35.91 kg/m   Wt Readings from Last 3 Encounters:  02/19/20 222 lb 6 oz (100.9 kg)  03/12/18  190 lb (86.2 kg)  04/25/17 180 lb 12.4 oz (82 kg)    Physical Exam Vitals and nursing note reviewed.  Constitutional:      Appearance: Normal appearance. She is not ill-appearing.  HENT:     Head: Atraumatic.  Eyes:     Extraocular Movements: Extraocular movements intact.     Conjunctiva/sclera: Conjunctivae normal.  Cardiovascular:     Rate and Rhythm: Normal rate and regular rhythm.     Heart sounds: Normal heart sounds.  Pulmonary:     Effort: Pulmonary effort is normal.     Breath sounds: Normal breath sounds.  Musculoskeletal:        General: Normal range of motion.     Cervical back: Normal range of motion and neck supple.  Skin:    General: Skin is warm and dry.     Comments: Significant stubble facial hair present on chin area  Neurological:     Mental Status: She is alert and oriented to person, place, and time.  Psychiatric:        Mood and Affect: Mood normal.        Thought Content: Thought content normal.        Judgment:  Judgment normal.     Results for orders placed or performed in visit on 02/19/20  VITAMIN D 25 Hydroxy (Vit-D Deficiency, Fractures)  Result Value Ref Range   Vit D, 25-Hydroxy 48.4 30.0 - 100.0 ng/mL  TSH  Result Value Ref Range   TSH 4.140 0.450 - 4.500 uIU/mL  Comprehensive metabolic panel  Result Value Ref Range   Glucose 106 (H) 65 - 99 mg/dL   BUN 6 6 - 24 mg/dL   Creatinine, Ser 1.01 (H) 0.57 - 1.00 mg/dL   GFR calc non Af Amer 66 >59 mL/min/1.73   GFR calc Af Amer 77 >59 mL/min/1.73   BUN/Creatinine Ratio 6 (L) 9 - 23   Sodium 138 134 - 144 mmol/L   Potassium 4.1 3.5 - 5.2 mmol/L   Chloride 98 96 - 106 mmol/L   CO2 26 20 - 29 mmol/L   Calcium 9.6 8.7 - 10.2 mg/dL   Total Protein 7.7 6.0 - 8.5 g/dL   Albumin 4.3 3.8 - 4.8 g/dL   Globulin, Total 3.4 1.5 - 4.5 g/dL   Albumin/Globulin Ratio 1.3 1.2 - 2.2   Bilirubin Total 0.4 0.0 - 1.2 mg/dL   Alkaline Phosphatase 101 39 - 117 IU/L   AST 15 0 - 40 IU/L   ALT 17 0 - 32 IU/L   CBC with Differential/Platelet  Result Value Ref Range   WBC 7.9 3.4 - 10.8 x10E3/uL   RBC 5.28 3.77 - 5.28 x10E6/uL   Hemoglobin 15.3 11.1 - 15.9 g/dL   Hematocrit 45.4 34.0 - 46.6 %   MCV 86 79 - 97 fL   MCH 29.0 26.6 - 33.0 pg   MCHC 33.7 31.5 - 35.7 g/dL   RDW 13.8 11.7 - 15.4 %   Platelets 151 150 - 450 x10E3/uL   Neutrophils 71 Not Estab. %   Lymphs 21 Not Estab. %   Monocytes 5 Not Estab. %   Eos 3 Not Estab. %   Basos 0 Not Estab. %   Neutrophils Absolute 5.6 1.4 - 7.0 x10E3/uL   Lymphocytes Absolute 1.6 0.7 - 3.1 x10E3/uL   Monocytes Absolute 0.4 0.1 - 0.9 x10E3/uL   EOS (ABSOLUTE) 0.2 0.0 - 0.4 x10E3/uL   Basophils Absolute 0.0 0.0 - 0.2 x10E3/uL   Immature Granulocytes 0 Not Estab. %   Immature Grans (Abs) 0.0 0.0 - 0.1 x10E3/uL  Vitamin B12  Result Value Ref Range   Vitamin B-12 332 232 - 1,245 pg/mL  Lipid Panel w/o Chol/HDL Ratio  Result Value Ref Range   Cholesterol, Total 231 (H) 100 - 199 mg/dL   Triglycerides 283 (H) 0 - 149 mg/dL   HDL 35 (L) >39 mg/dL   VLDL Cholesterol Cal 52 (H) 5 - 40 mg/dL   LDL Chol Calc (NIH) 144 (H) 0 - 99 mg/dL      Assessment & Plan:   Problem List Items Addressed This Visit      Cardiovascular and Mediastinum   Acute ischemic stroke ([place]) - Stroke: acute and sub-acute scattered cortical infarcts involving the left MCA, very likely embolic in the setting of   ICA stenosis. Underlying fibromuscular dysplasia     Lost to f/u after CVA several years ago. Will refer back to Neurology and restart preventative medications      Relevant Medications   spironolactone (ALDACTONE) 25 MG tablet   atorvastatin (LIPITOR) 80 MG tablet   Other Relevant Orders   Ambulatory referral to Neurology   Lipid Panel w/o Chol/HDL Ratio (Completed)  Internal carotid artery stenosis    Continue BP and chol control, lifestyle modifications      Relevant Medications   spironolactone (ALDACTONE) 25 MG tablet   atorvastatin (LIPITOR) 80 MG  tablet     Respiratory   Allergic rhinitis    Discussed zyrtec and flonase for prevention, suspect this is the cause of her several weeks of congestion. Avoid significant use of sudafed products given HTN risks and hx of CVA        Musculoskeletal and Integument   Hirsutism    Will trial spironolactone        Other   Fibromyalgia    Will start on cymbalta in hopes of hleping with pain sxs. Per pt request, Rheumatology referral placed as she wishes to be formally diagnosed and have other causes of her pain ruled out      Relevant Medications   DULoxetine (CYMBALTA) 30 MG capsule   Other Relevant Orders   Ambulatory referral to Rheumatology   Anxiety and depression    Start cymbalta, monitor closely for benefit      Relevant Medications   DULoxetine (CYMBALTA) 30 MG capsule    Other Visit Diagnoses    Nasal congestion    -  Primary   Hot flashes       Relevant Medications   spironolactone (ALDACTONE) 25 MG tablet   atorvastatin (LIPITOR) 80 MG tablet   Other Relevant Orders   TSH (Completed)   Comprehensive metabolic panel (Completed)   CBC with Differential/Platelet (Completed)   Vitamin B12 (Completed)   Vitamin D deficiency       Relevant Orders   VITAMIN D 25 Hydroxy (Vit-D Deficiency, Fractures) (Completed)       Follow up plan: Return in about 4 weeks (around 03/18/2020) for CPE.

## 2020-02-20 LAB — COMPREHENSIVE METABOLIC PANEL
ALT: 17 IU/L (ref 0–32)
AST: 15 IU/L (ref 0–40)
Albumin/Globulin Ratio: 1.3 (ref 1.2–2.2)
Albumin: 4.3 g/dL (ref 3.8–4.8)
Alkaline Phosphatase: 101 IU/L (ref 39–117)
BUN/Creatinine Ratio: 6 — ABNORMAL LOW (ref 9–23)
BUN: 6 mg/dL (ref 6–24)
Bilirubin Total: 0.4 mg/dL (ref 0.0–1.2)
CO2: 26 mmol/L (ref 20–29)
Calcium: 9.6 mg/dL (ref 8.7–10.2)
Chloride: 98 mmol/L (ref 96–106)
Creatinine, Ser: 1.01 mg/dL — ABNORMAL HIGH (ref 0.57–1.00)
GFR calc Af Amer: 77 mL/min/{1.73_m2} (ref >59)
GFR calc non Af Amer: 66 mL/min/{1.73_m2} (ref >59)
Globulin, Total: 3.4 g/dL (ref 1.5–4.5)
Glucose: 106 mg/dL — ABNORMAL HIGH (ref 65–99)
Potassium: 4.1 mmol/L (ref 3.5–5.2)
Sodium: 138 mmol/L (ref 134–144)
Total Protein: 7.7 g/dL (ref 6.0–8.5)

## 2020-02-20 LAB — CBC WITH DIFFERENTIAL/PLATELET
Basophils Absolute: 0 10*3/uL (ref 0.0–0.2)
Basos: 0 %
EOS (ABSOLUTE): 0.2 10*3/uL (ref 0.0–0.4)
Eos: 3 %
Hematocrit: 45.4 % (ref 34.0–46.6)
Hemoglobin: 15.3 g/dL (ref 11.1–15.9)
Immature Grans (Abs): 0 10*3/uL (ref 0.0–0.1)
Immature Granulocytes: 0 %
Lymphocytes Absolute: 1.6 10*3/uL (ref 0.7–3.1)
Lymphs: 21 %
MCH: 29 pg (ref 26.6–33.0)
MCHC: 33.7 g/dL (ref 31.5–35.7)
MCV: 86 fL (ref 79–97)
Monocytes Absolute: 0.4 10*3/uL (ref 0.1–0.9)
Monocytes: 5 %
Neutrophils Absolute: 5.6 10*3/uL (ref 1.4–7.0)
Neutrophils: 71 %
Platelets: 151 10*3/uL (ref 150–450)
RBC: 5.28 x10E6/uL (ref 3.77–5.28)
RDW: 13.8 % (ref 11.7–15.4)
WBC: 7.9 10*3/uL (ref 3.4–10.8)

## 2020-02-20 LAB — LIPID PANEL W/O CHOL/HDL RATIO
Cholesterol, Total: 231 mg/dL — ABNORMAL HIGH (ref 100–199)
HDL: 35 mg/dL — ABNORMAL LOW (ref >39)
LDL Chol Calc (NIH): 144 mg/dL — ABNORMAL HIGH (ref 0–99)
Triglycerides: 283 mg/dL — ABNORMAL HIGH (ref 0–149)
VLDL Cholesterol Cal: 52 mg/dL — ABNORMAL HIGH (ref 5–40)

## 2020-02-20 LAB — TSH: TSH: 4.14 u[IU]/mL (ref 0.450–4.500)

## 2020-02-20 LAB — VITAMIN D 25 HYDROXY (VIT D DEFICIENCY, FRACTURES): Vit D, 25-Hydroxy: 48.4 ng/mL (ref 30.0–100.0)

## 2020-02-20 LAB — VITAMIN B12: Vitamin B-12: 332 pg/mL (ref 232–1245)

## 2020-02-25 DIAGNOSIS — J309 Allergic rhinitis, unspecified: Secondary | ICD-10-CM | POA: Insufficient documentation

## 2020-02-25 DIAGNOSIS — F419 Anxiety disorder, unspecified: Secondary | ICD-10-CM | POA: Insufficient documentation

## 2020-02-25 NOTE — Assessment & Plan Note (Signed)
Discussed zyrtec and flonase for prevention, suspect this is the cause of her several weeks of congestion. Avoid significant use of sudafed products given HTN risks and hx of CVA

## 2020-02-25 NOTE — Assessment & Plan Note (Signed)
Start cymbalta, monitor closely for benefit

## 2020-02-25 NOTE — Assessment & Plan Note (Signed)
Continue BP and chol control, lifestyle modifications

## 2020-02-25 NOTE — Assessment & Plan Note (Signed)
Will trial spironolactone

## 2020-02-25 NOTE — Assessment & Plan Note (Signed)
Lost to f/u after CVA several years ago. Will refer back to Neurology and restart preventative medications

## 2020-02-25 NOTE — Assessment & Plan Note (Signed)
Will start on cymbalta in hopes of hleping with pain sxs. Per pt request, Rheumatology referral placed as she wishes to be formally diagnosed and have other causes of her pain ruled out

## 2020-03-17 ENCOUNTER — Telehealth: Payer: Self-pay | Admitting: General Practice

## 2020-03-17 NOTE — Telephone Encounter (Signed)
Called Latasha Lawrence she stated that she contacted [facility] and had them fax it over to her, it shows referrals on it. Referral is in for Trevor Iha MD. Records were sent to fax number [phone] which is incorrect.   Copied from CRM [id number]. Topic: General - Inquiry >> Mar 17, 2020  2:47 PM Reggie Pile, NT wrote: Reason for CRM: Latasha Lawrence with [facility] called in stating she believes the patient's medical records were sent to wrong office, [facility]. Please advise. Latasha Lawrence unsure where records are supposed to go and will reach out to her management. Please advise.

## 2020-03-18 ENCOUNTER — Encounter: Admitting: Family Medicine

## 2020-03-18 NOTE — Telephone Encounter (Signed)
[facility] denied the original referral so I sent this referral to [facility]. I called [facility] and this is the fax that I was provided. I also checked the web site and this is the fax number for the office. I will call Marylu Lund and see what I need to do to fix this. I will also call [facility] and let them know that I need the referral to go to them.

## 2020-03-25 ENCOUNTER — Encounter: Admitting: Family Medicine

## 2020-04-05 ENCOUNTER — Encounter: Admitting: Family Medicine

## 2020-04-14 ENCOUNTER — Ambulatory Visit (INDEPENDENT_AMBULATORY_CARE_PROVIDER_SITE_OTHER): Admitting: Family Medicine

## 2020-04-14 ENCOUNTER — Other Ambulatory Visit (HOSPITAL_COMMUNITY)
Admission: RE | Admit: 2020-04-14 | Discharge: 2020-04-14 | Disposition: A | Discharge status: Home / Self Care | Source: Ambulatory Visit | Attending: Family Medicine | Admitting: Family Medicine

## 2020-04-14 ENCOUNTER — Encounter: Payer: Self-pay | Admitting: Family Medicine

## 2020-04-14 ENCOUNTER — Other Ambulatory Visit: Payer: Self-pay

## 2020-04-14 VITALS — BP 118/76 | HR 84 | Temp 98.4°F | Ht 65.6 in | Wt 231.0 lb

## 2020-04-14 DIAGNOSIS — R7309 Other abnormal glucose: Secondary | ICD-10-CM | POA: Diagnosis not present

## 2020-04-14 DIAGNOSIS — J3089 Other allergic rhinitis: Secondary | ICD-10-CM

## 2020-04-14 DIAGNOSIS — I10 Essential (primary) hypertension: Secondary | ICD-10-CM | POA: Diagnosis not present

## 2020-04-14 DIAGNOSIS — Z1159 Encounter for screening for other viral diseases: Secondary | ICD-10-CM | POA: Diagnosis not present

## 2020-04-14 DIAGNOSIS — F329 Major depressive disorder, single episode, unspecified: Secondary | ICD-10-CM

## 2020-04-14 DIAGNOSIS — I63239 Cerebral infarction due to unspecified occlusion or stenosis of unspecified carotid arteries: Secondary | ICD-10-CM

## 2020-04-14 DIAGNOSIS — R3915 Urgency of urination: Secondary | ICD-10-CM | POA: Diagnosis not present

## 2020-04-14 DIAGNOSIS — Z800 Family history of malignant neoplasm of digestive organs: Secondary | ICD-10-CM

## 2020-04-14 DIAGNOSIS — N898 Other specified noninflammatory disorders of vagina: Secondary | ICD-10-CM

## 2020-04-14 DIAGNOSIS — Z124 Encounter for screening for malignant neoplasm of cervix: Secondary | ICD-10-CM | POA: Diagnosis not present

## 2020-04-14 DIAGNOSIS — I639 Cerebral infarction, unspecified: Secondary | ICD-10-CM

## 2020-04-14 DIAGNOSIS — R232 Flushing: Secondary | ICD-10-CM

## 2020-04-14 DIAGNOSIS — F32A Depression, unspecified: Secondary | ICD-10-CM

## 2020-04-14 DIAGNOSIS — Z1231 Encounter for screening mammogram for malignant neoplasm of breast: Secondary | ICD-10-CM | POA: Diagnosis not present

## 2020-04-14 DIAGNOSIS — Z114 Encounter for screening for human immunodeficiency virus [HIV]: Secondary | ICD-10-CM | POA: Diagnosis not present

## 2020-04-14 DIAGNOSIS — F419 Anxiety disorder, unspecified: Secondary | ICD-10-CM

## 2020-04-14 DIAGNOSIS — M797 Fibromyalgia: Secondary | ICD-10-CM

## 2020-04-14 DIAGNOSIS — Z0000 Encounter for general adult medical examination without abnormal findings: Secondary | ICD-10-CM

## 2020-04-14 MED ORDER — DULOXETINE HCL 60 MG PO CPEP: 60.0000 mg | ORAL_CAPSULE | Freq: Every day | ORAL | 2 refills | Status: DC

## 2020-04-14 NOTE — Patient Instructions (Addendum)
[facility] - [phone]  Please call at this number [facility]) to schedule your mammogram. [phone]

## 2020-04-14 NOTE — Progress Notes (Signed)
BP 118/76   Pulse 84   Temp 98.4 F (36.9 C) (Oral)   Ht 5' 5.6" (1.666 m)   Wt 231 lb (104.8 kg)   SpO2 94%   BMI 37.74 kg/m    Subjective:    Patient ID: Latasha Lawrence, female    DOB: 1971/06/06, 49 y.o.   MRN: [id number]  HPI: Latasha Lawrence is a 49 y.o. female presenting on 04/14/2020 for comprehensive medical examination. Current medical complaints include:see below  Hx of CVA, working on getting back in with Neurology as it's been several years since f/u. Trying to work on lifestyle habits, and taking high dose lipitor, plavix and monitoring BP closely. Denies any neurologic sxs.   Anxiety and depression - on cymbalta for this and her fibromyalgia pain which does seem to help a bit but not under as good of control as she'd like with either. Denies si/hi. Awaiting est care with Rheumatology for further workup with her fibromyalgia per patient request.   Sweating, hot flashes still very bothersome for her.   Fhx of colon cancer, father currently being treated. Has never had a screening colonoscopy in the past.   Urinary urgency and frequency, sometimes having accidents overnight. Also having a painful vaginal lesion that's been bothering her for over a week now.   She currently lives with: Menopausal Symptoms: no  Depression Screen done today and results listed below:  Depression screen [facility] 2/9 04/14/2020 02/19/2020  Decreased Interest 2 0  Down, Depressed, Hopeless 2 0  PHQ - 2 Score 4 0  Altered sleeping 3 -  Tired, decreased energy 3 -  Change in appetite 1 -  Feeling bad or failure about yourself  1 -  Trouble concentrating 2 -  Moving slowly or fidgety/restless 0 -  Suicidal thoughts 0 -  PHQ-9 Score 14 -    The patient does not have a history of falls. I did complete a risk assessment for falls. A plan of care for falls was documented.   Past Medical History:  Past Medical History:  Diagnosis Date  . Abnormal weight gain   . Anxiety   . Chronic back pain     . Controlled substance agreement terminated 07/2014   failed UDS at [facility]  . Depression   . Fibromyalgia   . GERD (gastroesophageal reflux disease)   . Headache   . Hirsutism   . Hypertension   . Hypokalemia   . Neutrophilic leukocytosis   . Periodontal disease   . Stomach ulcer   . Stroke ([facility])   . Vitamin D deficiency disease     Surgical History:  Past Surgical History:  Procedure Laterality Date  . Arm Surgery    . CESAREAN SECTION    . IR ANGIO INTRA EXTRACRAN SEL COM CAROTID INNOMINATE BILAT MOD SED  04/19/2017  . IR ANGIO VERTEBRAL SEL VERTEBRAL BILAT MOD SED  04/19/2017  . IR INTRAVSC STENT CERV CAROTID W/O EMB-PROT MOD SED INC ANGIO  04/25/2017  . RADIOLOGY WITH ANESTHESIA Left 04/25/2017   Procedure: RADIOLOGY WITH ANESTHESIA LEFT CARDIAC  STENT;  Surgeon: Julieanne Cotton, MD;  Location: [facility] OR;  Service: Radiology;  Laterality: Left;    Medications:  Current Outpatient Medications on File Prior to Visit  Medication Sig  . atorvastatin (LIPITOR) 80 MG tablet Take 1 tablet (80 mg total) by mouth daily at 6 PM.  . clopidogrel (PLAVIX) 75 MG tablet Take 1 tablet (75 mg total) by mouth daily.  Marland Kitchen  fluticasone (FLONASE) 50 MCG/ACT nasal spray Place 1 spray into both nostrils in the morning and at bedtime.  Marland Kitchen spironolactone (ALDACTONE) 25 MG tablet Take 1 tablet (25 mg total) by mouth daily.   No current facility-administered medications on file prior to visit.    Allergies:  Allergies  Allergen Reactions  . Nsaids Other (See Comments)    Causes ulcers to bleed  . Morphine And Related Itching    Social History:  Social History   Socioeconomic History  . Marital status: Married    Spouse name: Not on file  . Number of children: Not on file  . Years of education: Not on file  . Highest education level: Not on file  Occupational History  . Not on file  Tobacco Use  . Smoking status: Former Smoker    Packs/day: 1.00  . Smokeless tobacco:  Never Used  Vaping Use  . Vaping Use: Former  Substance and Sexual Activity  . Alcohol use: No  . Drug use: No  . Sexual activity: Not Currently    Birth control/protection: None  Other Topics Concern  . Not on file  Social History Narrative  . Not on file   Social Determinants of Health   Financial Resource Strain:   . Difficulty of Paying Living Expenses:   Food Insecurity:   . Worried About Programme researcher, broadcasting/film/video in the Last Year:   . Barista in the Last Year:   Transportation Needs:   . Freight forwarder (Medical):   Marland Kitchen Lack of Transportation (Non-Medical):   Physical Activity:   . Days of Exercise per Week:   . Minutes of Exercise per Session:   Stress:   . Feeling of Stress :   Social Connections:   . Frequency of Communication with Friends and Family:   . Frequency of Social Gatherings with Friends and Family:   . Attends Religious Services:   . Active Member of Clubs or Organizations:   . Attends Banker Meetings:   Marland Kitchen Marital Status:   Intimate Partner Violence:   . Fear of Current or Ex-Partner:   . Emotionally Abused:   Marland Kitchen Physically Abused:   . Sexually Abused:    Social History   Tobacco Use  Smoking Status Former Smoker  . Packs/day: 1.00  Smokeless Tobacco Never Used   Social History   Substance and Sexual Activity  Alcohol Use No    Family History:  Family History  Problem Relation Age of Onset  . Arthritis Mother   . Cancer Mother        breast  . Hyperlipidemia Father   . Hypertension Father   . Stroke Maternal Grandmother     Past medical history, surgical history, medications, allergies, family history and social history reviewed with patient today and changes made to appropriate areas of the chart.   Review of Systems - General ROS: negative Psychological ROS: negative Ophthalmic ROS: negative ENT ROS: negative Allergy and Immunology ROS: negative Hematological and Lymphatic ROS: negative Endocrine ROS:  negative Breast ROS: negative for breast lumps Respiratory ROS: no cough, shortness of breath, or wheezing Cardiovascular ROS: no chest pain or dyspnea on exertion Gastrointestinal ROS: no abdominal pain, change in bowel habits, or black or bloody stools Genito-Urinary ROS: vagianl lesion Musculoskeletal ROS: positive for - muscle pain Neurological ROS: no TIA or stroke symptoms Dermatological ROS: negative All other ROS negative except what is listed above and in the HPI.  Objective:    BP 118/76   Pulse 84   Temp 98.4 F (36.9 C) (Oral)   Ht 5' 5.6" (1.666 m)   Wt 231 lb (104.8 kg)   SpO2 94%   BMI 37.74 kg/m   Wt Readings from Last 3 Encounters:  04/14/20 231 lb (104.8 kg)  02/19/20 222 lb 6 oz (100.9 kg)  03/12/18 190 lb (86.2 kg)    Physical Exam Vitals and nursing note reviewed.  Constitutional:      General: She is not in acute distress.    Appearance: She is well-developed.  HENT:     Head: Atraumatic.     Right Ear: External ear normal.     Left Ear: External ear normal.     Nose: Nose normal.     Mouth/Throat:     Pharynx: No oropharyngeal exudate.  Eyes:     General: No scleral icterus.    Conjunctiva/sclera: Conjunctivae normal.     Pupils: Pupils are equal, round, and reactive to light.  Neck:     Thyroid: No thyromegaly.  Cardiovascular:     Rate and Rhythm: Normal rate and regular rhythm.     Heart sounds: Normal heart sounds.  Pulmonary:     Effort: Pulmonary effort is normal. No respiratory distress.     Breath sounds: Normal breath sounds.  Abdominal:     General: Bowel sounds are normal.     Palpations: Abdomen is soft. There is no mass.     Tenderness: There is no abdominal tenderness.  Genitourinary:    General: Normal vulva.     Vagina: No vaginal discharge.  Musculoskeletal:        General: No tenderness. Normal range of motion.     Cervical back: Normal range of motion and neck supple.  Lymphadenopathy:     Cervical: No  cervical adenopathy.  Skin:    General: Skin is warm and dry.     Findings: No rash.  Neurological:     General: No focal deficit present.     Mental Status: She is alert and oriented to person, place, and time.     Cranial Nerves: No cranial nerve deficit.  Psychiatric:        Behavior: Behavior normal.     Results for orders placed or performed in visit on 04/14/20  Microscopic Examination   Urine  Result Value Ref Range   WBC, UA 6-10 (A) 0 - 5 /hpf   RBC 0-2 0 - 2 /hpf   Epithelial Cells (non renal) 0-10 0 - 10 /hpf   Bacteria, UA Many (A) None seen/Few  Urine Culture, Reflex   Urine  Result Value Ref Range   Urine Culture, Routine Final report (A)    Organism ID, Bacteria Escherichia coli (A)    Antimicrobial Susceptibility Comment   Hepatitis C antibody  Result Value Ref Range   Hep C Virus Ab <0.1 0.0 - 0.9 s/co ratio  HIV Antibody (routine testing w rflx)  Result Value Ref Range   HIV Screen 4th Generation wRfx Non Reactive Non Reactive  HgB A1c  Result Value Ref Range   Hgb A1c MFr Bld 5.9 (H) 4.8 - 5.6 %   Est. average glucose Bld gHb Est-mCnc 123 mg/dL  UA/M w/rflx Culture, Routine   Specimen: Urine   Urine  Result Value Ref Range   Specific Gravity, UA 1.015 1.005 - 1.030   pH, UA 6.0 5.0 - 7.5   Color, UA Yellow Yellow  Appearance Ur Hazy (A) Clear   Leukocytes,UA Negative Negative   Protein,UA Negative Negative/Trace   Glucose, UA Negative Negative   Ketones, UA Negative Negative   RBC, UA Trace (A) Negative   Bilirubin, UA Negative Negative   Urobilinogen, Ur 0.2 0.2 - 1.0 mg/dL   Nitrite, UA Positive (A) Negative   Microscopic Examination See below:    Urinalysis Reflex Comment   HSV(herpes simplex vrs) 1+2 ab-IgG  Result Value Ref Range   HSV 1 Glycoprotein G Ab, IgG 59.80 (H) 0.00 - 0.90 index   HSV 2 IgG, Type Spec 14.60 (H) 0.00 - 0.90 index  Cytology - PAP  Result Value Ref Range   High risk HPV Negative    Adequacy       Satisfactory for evaluation; transformation zone component ABSENT.   Diagnosis      - Negative for intraepithelial lesion or malignancy (NILM)   Comment Normal Reference Range HPV - Negative       Assessment & Plan:   Problem List Items Addressed This Visit      Cardiovascular and Mediastinum   Hypertension - Primary    Stable and well controlled, continue current regimen      Acute ischemic stroke (HCC) - Stroke: acute and sub-acute scattered cortical infarcts involving the left MCA, very likely embolic in the setting of   ICA stenosis. Underlying fibromuscular dysplasia     Work on getting back in with Neurology, referral pending. Continue current regimen and managing risk factors. Neuro exam benign today      Carotid stenosis, symptomatic, with infarction [facility])    Continue current regimen, work on improving lifestyle habits for better control.         Respiratory   Allergic rhinitis    Discussed continued flonase, OTC antihistamines        Other   Fibromyalgia    Increase cymablta, continue to monitor for benefit      Relevant Medications   DULoxetine (CYMBALTA) 60 MG capsule   Anxiety and depression    Increase cymbalta to 60 mg and continue to monitor for benefit      Relevant Medications   DULoxetine (CYMBALTA) 60 MG capsule    Other Visit Diagnoses    Annual physical exam       Elevated glucose       per record review. A1C today. diet and exercise changes reviewed   Relevant Orders   HgB A1c (Completed)   Urinary urgency       Check U/A, treat as needed. Increase fluids and recommended probiotics   Relevant Orders   UA/M w/rflx Culture, Routine (Completed)   Microscopic Examination (Completed)   Urine Culture, Reflex (Completed)   Vaginal lesion       Has never been checked for HSV in the past, sitz baths and vaseline for home care and await labs   Relevant Orders   HSV(herpes simplex vrs) 1+2 ab-IgG (Completed)   Hot flashes       Increase  cymbalta, start magnesium and monitor for benefit   Need for hepatitis C screening test       Relevant Orders   Hepatitis C antibody (Completed)   Encounter for screening for HIV       Relevant Orders   HIV Antibody (routine testing w rflx) (Completed)   Encounter for screening mammogram for malignant neoplasm of breast       Relevant Orders   MM DIGITAL SCREENING BILATERAL   Screening for cervical  cancer       Relevant Orders   Cytology - PAP (Completed)   Family history of colon cancer       Relevant Orders   Ambulatory referral to Gastroenterology       50 minutes spent today in direct patient care and counseling regarding chronic conditions and new problems.   Follow up plan: Return in about 3 months (around 07/15/2020) for Hot flash f/u.   LABORATORY TESTING:  - Pap smear: pap done  IMMUNIZATIONS:   - Tdap: Tetanus vaccination status reviewed: last tetanus booster within 10 years. - Influenza: Postponed to flu season  SCREENING: -Mammogram: Ordered today  - Colonoscopy: Ordered today   PATIENT COUNSELING:   Advised to take 1 mg of folate supplement per day if capable of pregnancy.   Sexuality: Discussed sexually transmitted diseases, partner selection, use of condoms, avoidance of unintended pregnancy  and contraceptive alternatives.   Advised to avoid cigarette smoking.  I discussed with the patient that most people either abstain from alcohol or drink within safe limits (<=14/week and <=4 drinks/occasion for males, <=7/weeks and <= 3 drinks/occasion for females) and that the risk for alcohol disorders and other health effects rises proportionally with the number of drinks per week and how often a drinker exceeds daily limits.  Discussed cessation/primary prevention of drug use and availability of treatment for abuse.   Diet: Encouraged to adjust caloric intake to maintain  or achieve ideal body weight, to reduce intake of dietary saturated fat and total fat, to  limit sodium intake by avoiding high sodium foods and not adding table salt, and to maintain adequate dietary potassium and calcium preferably from fresh fruits, vegetables, and low-fat dairy products.    stressed the importance of regular exercise  Injury prevention: Discussed safety belts, safety helmets, smoke detector, smoking near bedding or upholstery.   Dental health: Discussed importance of regular tooth brushing, flossing, and dental visits.    NEXT PREVENTATIVE PHYSICAL DUE IN 1 YEAR. Return in about 3 months (around 07/15/2020) for Hot flash f/u.

## 2020-04-15 LAB — HSV(HERPES SIMPLEX VRS) I + II AB-IGG
HSV 1 Glycoprotein G Ab, IgG: 59.8 index — ABNORMAL HIGH (ref 0.00–0.90)
HSV 2 IgG, Type Spec: 14.6 index — ABNORMAL HIGH (ref 0.00–0.90)

## 2020-04-15 LAB — HIV ANTIBODY (ROUTINE TESTING W REFLEX): HIV Screen 4th Generation wRfx: NONREACTIVE

## 2020-04-15 LAB — HEMOGLOBIN A1C
Est. average glucose Bld gHb Est-mCnc: 123 mg/dL
Hgb A1c MFr Bld: 5.9 % — ABNORMAL HIGH (ref 4.8–5.6)

## 2020-04-15 LAB — HEPATITIS C ANTIBODY: Hep C Virus Ab: <0.1 s/co ratio (ref 0.0–0.9)

## 2020-04-16 LAB — CYTOLOGY - PAP
Adequacy: ABSENT
Comment: NEGATIVE
Diagnosis: NEGATIVE
High risk HPV: NEGATIVE

## 2020-04-19 LAB — UA/M W/RFLX CULTURE, ROUTINE
Bilirubin, UA: NEGATIVE
Glucose, UA: NEGATIVE
Ketones, UA: NEGATIVE
Leukocytes,UA: NEGATIVE
Nitrite, UA: POSITIVE — AB
Protein,UA: NEGATIVE
Specific Gravity, UA: 1.015 (ref 1.005–1.030)
Urobilinogen, Ur: 0.2 mg/dL (ref 0.2–1.0)
pH, UA: 6 (ref 5.0–7.5)

## 2020-04-19 LAB — MICROSCOPIC EXAMINATION

## 2020-04-19 LAB — URINE CULTURE, REFLEX

## 2020-04-20 ENCOUNTER — Telehealth: Payer: Self-pay | Admitting: Family Medicine

## 2020-04-20 DIAGNOSIS — B009 Herpesviral infection, unspecified: Secondary | ICD-10-CM | POA: Insufficient documentation

## 2020-04-20 DIAGNOSIS — R7301 Impaired fasting glucose: Secondary | ICD-10-CM | POA: Insufficient documentation

## 2020-04-20 MED ORDER — SULFAMETHOXAZOLE-TRIMETHOPRIM 800-160 MG PO TABS: 1.0000 | ORAL_TABLET | Freq: Two times a day (BID) | ORAL | 0 refills | Status: DC

## 2020-04-20 NOTE — Assessment & Plan Note (Signed)
Continue current regimen, work on improving lifestyle habits for better control.  

## 2020-04-20 NOTE — Telephone Encounter (Signed)
Called pt unable to LVM due to VM box full.

## 2020-04-20 NOTE — Telephone Encounter (Signed)
Called pt regarding her lab results - unable to leave VM due to VM box full.   Please let her know her A1C puts her in the prediabetes category, so diet and exercise changes are super important to keep that under good control. We will recheck things next time to see if this is improved.   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   Her urine showed a UTI so I am sending over antibiotics for that.

## 2020-04-20 NOTE — Assessment & Plan Note (Signed)
Increase cymbalta to 60 mg and continue to monitor for benefit

## 2020-04-20 NOTE — Assessment & Plan Note (Signed)
Increase cymablta, continue to monitor for benefit

## 2020-04-20 NOTE — Assessment & Plan Note (Signed)
Stable and well controlled, continue current regimen 

## 2020-04-20 NOTE — Assessment & Plan Note (Signed)
Discussed continued flonase, OTC antihistamines

## 2020-04-20 NOTE — Assessment & Plan Note (Signed)
Work on getting back in with Neurology, referral pending. Continue current regimen and managing risk factors. Neuro exam benign today

## 2020-04-21 NOTE — Telephone Encounter (Signed)
Called and LVM asking for patient to please return my call.  

## 2020-04-22 NOTE — Telephone Encounter (Signed)
Patient notified of lab results. Patient agreed for the antiviral medications to be sent in to her pharmacy.

## 2020-04-23 ENCOUNTER — Encounter: Payer: Self-pay | Admitting: *Deleted

## 2020-04-23 MED ORDER — VALACYCLOVIR HCL 1 G PO TABS: 1000.0000 mg | ORAL_TABLET | Freq: Two times a day (BID) | ORAL | 2 refills | Status: DC | PRN

## 2020-04-23 NOTE — Addendum Note (Signed)
Addended by: Roosvelt Maser E on: 04/23/2020 03:18 PM   Modules accepted: Orders

## 2020-04-23 NOTE — Telephone Encounter (Signed)
Rx sent 

## 2020-05-11 ENCOUNTER — Other Ambulatory Visit: Payer: Self-pay

## 2020-05-12 MED ORDER — SPIRONOLACTONE 25 MG PO TABS: 25.0000 mg | ORAL_TABLET | Freq: Every day | ORAL | 0 refills | Status: DC

## 2020-05-26 DIAGNOSIS — Z8673 Personal history of transient ischemic attack (TIA), and cerebral infarction without residual deficits: Secondary | ICD-10-CM | POA: Insufficient documentation

## 2020-06-21 ENCOUNTER — Encounter (INDEPENDENT_AMBULATORY_CARE_PROVIDER_SITE_OTHER): Admitting: Vascular Surgery

## 2020-06-28 ENCOUNTER — Other Ambulatory Visit: Payer: Self-pay | Admitting: Unknown Physician Specialty

## 2020-07-15 ENCOUNTER — Other Ambulatory Visit: Payer: Self-pay

## 2020-07-15 ENCOUNTER — Encounter: Payer: Self-pay | Admitting: Unknown Physician Specialty

## 2020-07-15 ENCOUNTER — Ambulatory Visit (INDEPENDENT_AMBULATORY_CARE_PROVIDER_SITE_OTHER): Admitting: Unknown Physician Specialty

## 2020-07-15 ENCOUNTER — Ambulatory Visit: Admitting: Family Medicine

## 2020-07-15 VITALS — BP 124/80 | HR 83 | Temp 99.0°F | Ht 66.0 in | Wt 225.0 lb

## 2020-07-15 DIAGNOSIS — F410 Panic disorder [episodic paroxysmal anxiety] without agoraphobia: Secondary | ICD-10-CM

## 2020-07-15 DIAGNOSIS — Z23 Encounter for immunization: Secondary | ICD-10-CM | POA: Diagnosis not present

## 2020-07-15 DIAGNOSIS — F419 Anxiety disorder, unspecified: Secondary | ICD-10-CM

## 2020-07-15 DIAGNOSIS — F17200 Nicotine dependence, unspecified, uncomplicated: Secondary | ICD-10-CM

## 2020-07-15 DIAGNOSIS — F32A Depression, unspecified: Secondary | ICD-10-CM

## 2020-07-15 DIAGNOSIS — R0602 Shortness of breath: Secondary | ICD-10-CM

## 2020-07-15 DIAGNOSIS — R232 Flushing: Secondary | ICD-10-CM

## 2020-07-15 MED ORDER — TRAZODONE HCL 50 MG PO TABS: 25.0000 mg | ORAL_TABLET | Freq: Every evening | ORAL | 3 refills | Status: DC | PRN

## 2020-07-15 MED ORDER — DULOXETINE HCL 30 MG PO CPEP: 30.0000 mg | ORAL_CAPSULE | Freq: Every day | ORAL | 1 refills | Status: DC

## 2020-07-15 NOTE — Progress Notes (Signed)
BP 124/80 (BP Location: Left Arm, Patient Position: Sitting, Cuff Size: Large)   Pulse 83   Temp 99 F (37.2 C) (Oral)   Ht 5\' 6"  (1.676 m)   Wt 225 lb (102.1 kg)   SpO2 99%   BMI 36.32 kg/m    Subjective:    Patient ID: Latasha Lawrence, female    DOB: [birth year], 49 y.o.   MRN: [id number]  HPI: Latasha Lawrence is a 49 y.o. female  Chief Complaint  Patient presents with  . Follow-up    hot flashes    Lately pt has had a lot of problems being anxious irritable and having panic attacks.  Pt states she is having panic attacks once a week which lasts for 20-30 minutes.  States she feels she can't breath and yells and out of control.  States this happens when she is under a lot of pressure.  Last time states she had a list of things she needed to get done and could not get it done.  States this has been going on for a year and here because husband asked her to get it checked out.  Last time she saw cardiology was 1 year ago.  States that increasing Cymbalta has made depression worse.    Depression screen [facility] 2/9 07/15/2020 04/14/2020 02/19/2020  Decreased Interest 1 2 0  Down, Depressed, Hopeless 3 2 0  PHQ - 2 Score 4 4 0  Altered sleeping 3 3 -  Tired, decreased energy 3 3 -  Change in appetite 2 1 -  Feeling bad or failure about yourself  2 1 -  Trouble concentrating 2 2 -  Moving slowly or fidgety/restless 0 0 -  Suicidal thoughts 1 0 -  PHQ-9 Score 17 14 -  Difficult doing work/chores Very difficult - -     Social History   Socioeconomic History  . Marital status: Married    Spouse name: Not on file  . Number of children: Not on file  . Years of education: Not on file  . Highest education level: Not on file  Occupational History  . Not on file  Tobacco Use  . Smoking status: Current Every Day Smoker    Packs/day: 1.00    Years: 40.00    Pack years: 40.00  . Smokeless tobacco: Never Used  Vaping Use  . Vaping Use: Former  Substance and Sexual Activity  . Alcohol  use: No  . Drug use: No  . Sexual activity: Not Currently    Birth control/protection: None  Other Topics Concern  . Not on file  Social History Narrative  . Not on file   Social Determinants of Health   Financial Resource Strain:   . Difficulty of Paying Living Expenses: Not on file  Food Insecurity:   . Worried About 02/21/2020 in the Last Year: Not on file  . Ran Out of Food in the Last Year: Not on file  Transportation Needs:   . Lack of Transportation (Medical): Not on file  . Lack of Transportation (Non-Medical): Not on file  Physical Activity:   . Days of Exercise per Week: Not on file  . Minutes of Exercise per Session: Not on file  Stress:   . Feeling of Stress : Not on file  Social Connections:   . Frequency of Communication with Friends and Family: Not on file  . Frequency of Social Gatherings with Friends and Family: Not on file  . Attends Religious  Services: Not on file  . Active Member of Clubs or Organizations: Not on file  . Attends Banker Meetings: Not on file  . Marital Status: Not on file  Intimate Partner Violence:   . Fear of Current or Ex-Partner: Not on file  . Emotionally Abused: Not on file  . Physically Abused: Not on file  . Sexually Abused: Not on file      Relevant past medical, surgical, family and social history reviewed and updated as indicated. Interim medical history since our last visit reviewed. Allergies and medications reviewed and updated.  Review of Systems  Constitutional: Positive for activity change, diaphoresis and fatigue. Negative for appetite change and fever.  Respiratory: Positive for chest tightness and shortness of breath. Negative for cough.   Cardiovascular: Negative for chest pain and leg swelling.  Gastrointestinal: Negative.     Per HPI unless specifically indicated above     Objective:    BP 124/80 (BP Location: Left Arm, Patient Position: Sitting, Cuff Size: Large)   Pulse 83    Temp 99 F (37.2 C) (Oral)   Ht 5\' 6"  (1.676 m)   Wt 225 lb (102.1 kg)   SpO2 99%   BMI 36.32 kg/m   Wt Readings from Last 3 Encounters:  07/15/20 225 lb (102.1 kg)  04/14/20 231 lb (104.8 kg)  02/19/20 222 lb 6 oz (100.9 kg)    Physical Exam Constitutional:      General: She is not in acute distress.    Appearance: Normal appearance. She is well-developed.  HENT:     Head: Normocephalic and atraumatic.  Eyes:     General: Lids are normal. No scleral icterus.       Right eye: No discharge.        Left eye: No discharge.     Conjunctiva/sclera: Conjunctivae normal.  Neck:     Vascular: No carotid bruit or JVD.  Cardiovascular:     Rate and Rhythm: Normal rate and regular rhythm.     Heart sounds: Normal heart sounds.  Pulmonary:     Effort: Pulmonary effort is normal.     Breath sounds: Normal breath sounds.  Abdominal:     Palpations: There is no hepatomegaly or splenomegaly.  Musculoskeletal:        General: Normal range of motion.     Cervical back: Normal range of motion and neck supple.  Skin:    General: Skin is warm and dry.     Coloration: Skin is not pale.     Findings: No rash.  Neurological:     Mental Status: She is alert and oriented to person, place, and time.  Psychiatric:        Behavior: Behavior normal.        Thought Content: Thought content normal.        Judgment: Judgment normal.    EKG - NSR without TWST changes.    Results for orders placed or performed in visit on 04/14/20  Microscopic Examination   Urine  Result Value Ref Range   WBC, UA 6-10 (A) 0 - 5 /hpf   RBC 0-2 0 - 2 /hpf   Epithelial Cells (non renal) 0-10 0 - 10 /hpf   Bacteria, UA Many (A) None seen/Few  Urine Culture, Reflex   Urine  Result Value Ref Range   Urine Culture, Routine Final report (A)    Organism ID, Bacteria Escherichia coli (A)    Antimicrobial Susceptibility Comment   Hepatitis C  antibody  Result Value Ref Range   Hep C Virus Ab <0.1 0.0 - 0.9 s/co  ratio  HIV Antibody (routine testing w rflx)  Result Value Ref Range   HIV Screen 4th Generation wRfx Non Reactive Non Reactive  HgB A1c  Result Value Ref Range   Hgb A1c MFr Bld 5.9 (H) 4.8 - 5.6 %   Est. average glucose Bld gHb Est-mCnc 123 mg/dL  UA/M w/rflx Culture, Routine   Specimen: Urine   Urine  Result Value Ref Range   Specific Gravity, UA 1.015 1.005 - 1.030   pH, UA 6.0 5.0 - 7.5   Color, UA Yellow Yellow   Appearance Ur Hazy (A) Clear   Leukocytes,UA Negative Negative   Protein,UA Negative Negative/Trace   Glucose, UA Negative Negative   Ketones, UA Negative Negative   RBC, UA Trace (A) Negative   Bilirubin, UA Negative Negative   Urobilinogen, Ur 0.2 0.2 - 1.0 mg/dL   Nitrite, UA Positive (A) Negative   Microscopic Examination See below:    Urinalysis Reflex Comment   HSV(herpes simplex vrs) 1+2 ab-IgG  Result Value Ref Range   HSV 1 Glycoprotein G Ab, IgG 59.80 (H) 0.00 - 0.90 index   HSV 2 IgG, Type Spec 14.60 (H) 0.00 - 0.90 index  Cytology - PAP  Result Value Ref Range   High risk HPV Negative    Adequacy      Satisfactory for evaluation; transformation zone component ABSENT.   Diagnosis      - Negative for intraepithelial lesion or malignancy (NILM)   Comment Normal Reference Range HPV - Negative       Assessment & Plan:   Problem List Items Addressed This Visit      Unprioritized   Anxiety and depression   Relevant Medications   DULoxetine (CYMBALTA) 30 MG capsule   traZODone (DESYREL) 50 MG tablet   Other Relevant Orders   EKG 12-Lead (Completed)   Panic disorder    Pt with known cardiovascular disease experiencing panic disorder symptoms.  Will refer to cardiology for further evaluation      Relevant Medications   DULoxetine (CYMBALTA) 30 MG capsule   traZODone (DESYREL) 50 MG tablet   Smoking    Smokes 1 ppd.  Will use tobacco replacement products.  Unable to afford Chantix though I think it is not a good idea with current Depression        Other Visit Diagnoses    Hot flashes    -  Primary   Long standing problem without any changes   Need for influenza vaccination       SOB (shortness of breath)       With panic disorder.  Will get evaluation through cardiology   Relevant Orders   Ambulatory referral to Cardiology       Follow up plan: Return in about 4 weeks (around 08/12/2020).

## 2020-07-15 NOTE — Assessment & Plan Note (Signed)
Smokes 1 ppd.  Will use tobacco replacement products.  Unable to afford Chantix though I think it is not a good idea with current Depression

## 2020-07-15 NOTE — Assessment & Plan Note (Signed)
Pt with known cardiovascular disease experiencing panic disorder symptoms.  Will refer to cardiology for further evaluation

## 2020-07-28 ENCOUNTER — Encounter (INDEPENDENT_AMBULATORY_CARE_PROVIDER_SITE_OTHER): Payer: Self-pay | Admitting: Vascular Surgery

## 2020-08-09 ENCOUNTER — Other Ambulatory Visit: Payer: Self-pay | Admitting: Unknown Physician Specialty

## 2020-08-09 ENCOUNTER — Other Ambulatory Visit: Payer: Self-pay | Admitting: Family Medicine

## 2020-08-09 NOTE — Telephone Encounter (Signed)
Requested Prescriptions  Pending Prescriptions Disp Refills   spironolactone (ALDACTONE) 25 MG tablet [Pharmacy Med Name: SPIRONOLACTONE 25 MG TABLET] 30 tablet 0    Sig: Take 1 tablet (25 mg total) by mouth daily.     Cardiovascular: Diuretics - Aldosterone Antagonist Failed - 08/09/2020  4:39 PM      Failed - Cr in normal range and within 360 days    Creatinine  Date Value Ref Range Status  09/06/2014 0.97 0.60 - 1.30 mg/dL Final   Creatinine, Ser  Date Value Ref Range Status  02/19/2020 1.01 (H) 0.57 - 1.00 mg/dL Final         Passed - K in normal range and within 360 days    Potassium  Date Value Ref Range Status  02/19/2020 4.1 3.5 - 5.2 mmol/L Final  09/06/2014 3.8 3.5 - 5.1 mmol/L Final         Passed - Na in normal range and within 360 days    Sodium  Date Value Ref Range Status  02/19/2020 138 134 - 144 mmol/L Final  09/06/2014 138 136 - 145 mmol/L Final         Passed - Last BP in normal range    BP Readings from Last 1 Encounters:  07/15/20 124/80         Passed - Valid encounter within last 6 months    Recent Outpatient Visits          3 weeks ago Hot flashes   [facility] Gabriel Cirri, NP   3 months ago Essential hypertension   [facility] Particia Nearing, [place]   5 months ago Nasal congestion   [facility] [place], Salley Hews, [place]      Future Appointments            In 1 week Laural Benes, Oralia Rud, DO [employer], [facility]

## 2020-08-09 NOTE — Telephone Encounter (Signed)
Already refilled by provider on 07/15/20. Refusing duplicate request.

## 2020-08-10 ENCOUNTER — Other Ambulatory Visit: Payer: Self-pay | Admitting: Family Medicine

## 2020-08-16 ENCOUNTER — Emergency Department
Admission: EM | Admit: 2020-08-16 | Discharge: 2020-08-16 | Disposition: A | Discharge status: Home / Self Care | Attending: Emergency Medicine | Admitting: Emergency Medicine

## 2020-08-16 ENCOUNTER — Other Ambulatory Visit: Payer: Self-pay

## 2020-08-16 ENCOUNTER — Encounter: Payer: Self-pay | Admitting: *Deleted

## 2020-08-16 DIAGNOSIS — K0889 Other specified disorders of teeth and supporting structures: Secondary | ICD-10-CM | POA: Diagnosis not present

## 2020-08-16 DIAGNOSIS — Z5321 Procedure and treatment not carried out due to patient leaving prior to being seen by health care provider: Secondary | ICD-10-CM | POA: Diagnosis not present

## 2020-08-16 NOTE — ED Triage Notes (Signed)
Pt has pain in left lower jaw/tooth.  Taking motrin without relief  Sx for 1 week.   Pt alert.

## 2020-08-19 ENCOUNTER — Ambulatory Visit: Admitting: Family Medicine

## 2020-08-19 ENCOUNTER — Ambulatory Visit: Admitting: Unknown Physician Specialty

## 2020-09-09 ENCOUNTER — Encounter: Payer: Self-pay | Admitting: Nurse Practitioner

## 2020-09-09 ENCOUNTER — Other Ambulatory Visit: Payer: Self-pay

## 2020-09-09 ENCOUNTER — Ambulatory Visit (INDEPENDENT_AMBULATORY_CARE_PROVIDER_SITE_OTHER): Admitting: Nurse Practitioner

## 2020-09-09 VITALS — BP 142/87 | HR 85 | Temp 99.3°F | Wt 228.5 lb

## 2020-09-09 DIAGNOSIS — R8281 Pyuria: Secondary | ICD-10-CM | POA: Diagnosis not present

## 2020-09-09 DIAGNOSIS — K219 Gastro-esophageal reflux disease without esophagitis: Secondary | ICD-10-CM

## 2020-09-09 DIAGNOSIS — R6889 Other general symptoms and signs: Secondary | ICD-10-CM | POA: Diagnosis not present

## 2020-09-09 DIAGNOSIS — R300 Dysuria: Secondary | ICD-10-CM

## 2020-09-09 DIAGNOSIS — K5909 Other constipation: Secondary | ICD-10-CM

## 2020-09-09 LAB — URINALYSIS, ROUTINE W REFLEX MICROSCOPIC
Bilirubin, UA: NEGATIVE
Glucose, UA: NEGATIVE
Ketones, UA: NEGATIVE
Nitrite, UA: NEGATIVE
Protein,UA: NEGATIVE
Specific Gravity, UA: 1.015 (ref 1.005–1.030)
Urobilinogen, Ur: 0.2 mg/dL (ref 0.2–1.0)
pH, UA: 5 (ref 5.0–7.5)

## 2020-09-09 LAB — MICROSCOPIC EXAMINATION: RBC, Urine: >30 /hpf — AB (ref 0–2)

## 2020-09-09 MED ORDER — POLYETHYLENE GLYCOL 3350 17 GM/SCOOP PO POWD: 17.0000 g | Freq: Two times a day (BID) | ORAL | 1 refills | Status: DC | PRN

## 2020-09-09 MED ORDER — PANTOPRAZOLE SODIUM 40 MG PO TBEC: 40.0000 mg | DELAYED_RELEASE_TABLET | Freq: Every day | ORAL | 0 refills | Status: DC

## 2020-09-09 MED ORDER — SULFAMETHOXAZOLE-TRIMETHOPRIM 800-160 MG PO TABS: 1.0000 | ORAL_TABLET | Freq: Two times a day (BID) | ORAL | 0 refills | Status: AC

## 2020-09-09 NOTE — Assessment & Plan Note (Signed)
Acute, ongoing x 3 weeks.  UA today showed 3+ blood, although patient is on menses, trace leukocytes and few bacteria.  Given new development of new back pain, will treat for UTI and send culture and await results.  Last UTI in July sensitive to Bactrim; will treat with for 5 days.  Also cannot rule out kidney stone, encouraged hydration with plenty of water.  With any nausea/vomiting and unable to keep fluids down, go to ED.  Follow up in 6 weeks.

## 2020-09-09 NOTE — Assessment & Plan Note (Addendum)
Chronic, ongoing.  With previous reported stomach ulcers, will start daily PPI and follow up in 6 weeks for trial of discontinuance.  No red flags in history or on examination.  If symptoms do not improve, return to clinic sooner.

## 2020-09-09 NOTE — Patient Instructions (Signed)
Pyelonephritis, Adult °Pyelonephritis is an infection that occurs in the kidney. The kidneys are the organs that filter a person's blood and move waste out of the bloodstream and into the urine. Urine passes from the kidneys, through tubes called ureters, and into the bladder. There are two main types of pyelonephritis: °· Infections that come on quickly without any warning (acute pyelonephritis). °· Infections that last for a long period of time (chronic pyelonephritis). °In most cases, the infection clears up with treatment and does not cause further problems. More severe infections or chronic infections can sometimes spread to the bloodstream or lead to other problems with the kidneys. °What are the causes? °This condition is usually caused by: °· Bacteria traveling from the bladder up to the kidney. This may occur after having a bladder infection (cystitis) or urinary tract infection (UTI). °· Bladder infections caused from bacteria traveling from the bloodstream to the kidney. °What increases the risk? °This condition is more likely to develop in: °· Pregnant women. °· Older people. °· People who have any of these conditions: °? Diabetes. °? Inflammation of the prostate gland (prostatitis), in males. °? Kidney stones or bladder stones. °? Other abnormalities of the kidney or ureter. °? Cancer. °· People who have a catheter placed in the bladder. °· People who are sexually active. °· Women who use spermicides. °· People who have had a prior UTI. °What are the signs or symptoms? °Symptoms of this condition include: °· Frequent urination. °· Strong or persistent urge to urinate. °· Burning or stinging when urinating. °· Abdominal pain. °· Back pain. °· Pain in the side or flank area. °· Fever or chills. °· Blood in the urine, or dark urine. °· Nausea or vomiting. °How is this diagnosed? °This condition may be diagnosed based on: °· Your medical history and a physical exam. °· Urine tests. °· Blood tests. °You may  also have imaging tests of the kidneys, such as an ultrasound or CT scan. °How is this treated? °Treatment for this condition may depend on the severity of the infection. °· If the infection is mild and is found early, you may be treated with antibiotic medicines taken by mouth (orally). You will need to drink fluids to remain hydrated. °· If the infection is more severe, you may need to stay in the hospital and receive antibiotics given directly into a vein through an IV. You may also need to receive fluids through an IV if you are not able to remain hydrated. After your hospital stay, you may need to take oral antibiotics for a period of time. °Other treatments may be required, depending on the cause of the infection. °Follow these instructions at home: °Medicines °· Take your antibiotic medicine as told by your health care provider. Do not stop taking the antibiotic even if you start to feel better. °· Take over-the-counter and prescription medicines only as told by your health care provider. °General instructions ° °· Drink enough fluid to keep your urine pale yellow. °· Avoid caffeine, tea, and carbonated beverages. They tend to irritate the bladder. °· Urinate often. Avoid holding in urine for long periods of time. °· Urinate before and after sex. °· After a bowel movement, women should cleanse from front to back. Use each tissue only once. °· Keep all follow-up visits as told by your health care provider. This is important. °Contact a health care provider if: °· Your symptoms do not get better after 2 days of treatment. °· Your symptoms get worse. °·   You have a fever. °Get help right away if you: °· Are unable to take your antibiotics or fluids. °· Have shaking chills. °· Vomit. °· Have severe flank or back pain. °· Have extreme weakness or fainting. °Summary °· Pyelonephritis is a urinary tract infection (UTI) that occurs in the kidney. °· Treatment for this condition may depend on the severity of the  infection. °· Take your antibiotic medicine as told by your health care provider. Do not stop taking the antibiotic even if you start to feel better. °· Drink enough fluid to keep your urine pale yellow. °· Keep all follow-up visits as told by your health care provider. This is important. °This information is not intended to replace advice given to you by your health care provider. Make sure you discuss any questions you have with your health care provider. °Document Revised: 07/30/2018 Document Reviewed: 07/30/2018 °[employer] Patient Education © 2020 [employer] ° °

## 2020-09-09 NOTE — Progress Notes (Signed)
BP (!) 142/87   Pulse 85   Temp 99.3 F (37.4 C)   Wt 228 lb 8 oz (103.6 kg)   SpO2 96%   BMI 36.88 kg/m    Subjective:    Patient ID: Latasha Lawrence, female    DOB: Jun 30, 1971, 49 y.o.   MRN: [id number]  HPI: Latasha Lawrence is a 49 y.o. female presenting with urinary symptoms.   Chief Complaint  Patient presents with  . Dysuria    frequency, voiding small amounts and back pain   URINARY SYMPTOMS Patient is on her menses Duration: 3 weeks Dysuria: yes Urinary frequency: yes Urgency: yes Small volume voids: yes Symptom severity: mild Urinary incontinence: yes;  Foul odor: yes Hematuria: no Abdominal pain: no Back pain: yes; both sides Suprapubic pain/pressure: yes Flank pain: yes Fever:  No Nausea: no Vomiting: no Relief with cranberry juice: not tried Relief with pyridium: not tried Status: worse Previous urinary tract infection: yes Recurrent urinary tract infection: no Sexual activity: No sexually active History of sexually transmitted disease: yes; herpes Vaginal discharge: no Treatments attempted: increasing fluids   GERD Has history of stomach ulcers. GERD control status: uncontrolled Satisfied with current treatment? n/a Heartburn frequency: daily Medication side effects: no  Medication compliance: not currently on treatment Previous GERD medications: does not remember Duration: 6 hours Nature: burning up throat and in chest; Location: throat and chest Heartburn duration: chronic Alleviatiating factors:  Laying elevated in the bed Aggravating factors: nothing; present all of the time Dysphagia: no Odynophagia:  no Hematemesis: no Blood in stool: no  NSAID use: rare EGD: yes; reports 3 years ago  CONSTIPATION Reports this has been an ongoing issue.  Has been having some abdominal pain.  Has not tried anything to help with this, does not like stool softeners because does not like having diarrhea.  UNWANTED HAIR Reports she has a lot of  excessive hair growth on her face.  Wondering if there is a topical medication she can try.  Allergies  Allergen Reactions  . Nsaids Other (See Comments)    Causes ulcers to bleed  . Morphine And Related Itching   Outpatient Encounter Medications as of 09/09/2020  Medication Sig  . atorvastatin (LIPITOR) 80 MG tablet Take 1 tablet (80 mg total) by mouth daily at 6 PM.  . clopidogrel (PLAVIX) 75 MG tablet Take 1 tablet (75 mg total) by mouth daily.  . DULoxetine (CYMBALTA) 30 MG capsule Take 1 capsule (30 mg total) by mouth daily.  . fluticasone (FLONASE) 50 MCG/ACT nasal spray Place 1 spray into both nostrils in the morning and at bedtime.  . pantoprazole (PROTONIX) 40 MG tablet Take 1 tablet (40 mg total) by mouth daily.  . polyethylene glycol powder (GLYCOLAX/MIRALAX) 17 GM/SCOOP powder Take 17 g by mouth 2 (two) times daily as needed for mild constipation or moderate constipation. Take 1-2 times daily with goal of having 1 formed bowel movement per day  . spironolactone (ALDACTONE) 25 MG tablet Take 1 tablet (25 mg total) by mouth daily.  Marland Kitchen sulfamethoxazole-trimethoprim (BACTRIM DS) 800-160 MG tablet Take 1 tablet by mouth 2 (two) times daily for 5 days.  . traZODone (DESYREL) 50 MG tablet Take 0.5-1 tablets (25-50 mg total) by mouth at bedtime as needed for sleep.  . [DISCONTINUED] valACYclovir (VALTREX) 1000 MG tablet Take 1 tablet (1,000 mg total) by mouth 2 (two) times daily as needed. For flares   No facility-administered encounter medications on file as of 09/09/2020.  Patient Active Problem List   Diagnosis Date Noted  . Dysuria 09/09/2020  . Gastroesophageal reflux disease 09/09/2020  . Other constipation 09/09/2020  . Panic disorder 07/15/2020  . Smoking 07/15/2020  . IFG (impaired fasting glucose) 04/20/2020  . HSV (herpes simplex virus) infection 04/20/2020  . Anxiety and depression 02/25/2020  . Allergic rhinitis 02/25/2020  . Internal carotid artery stenosis  04/26/2017  . Carotid stenosis, symptomatic, with infarction (HCC) 04/25/2017  . Cerebral embolus 04/25/2017  . Acute ischemic stroke (HCC) - Stroke: acute and sub-acute scattered cortical infarcts involving the left MCA, very likely embolic in the setting of   ICA stenosis. Underlying fibromuscular dysplasia  04/18/2017  . Hypertension   . Fibromyalgia   . Vitamin D deficiency disease   . Hirsutism   . Hypokalemia   . Neutrophilic leukocytosis   . Periodontal disease   . Abnormal weight gain   . Chronic back pain    Past Medical History:  Diagnosis Date  . Abnormal weight gain   . Anxiety   . Chronic back pain   . Controlled substance agreement terminated 07/2014   failed UDS [facility]  . Depression   . Fibromyalgia   . GERD (gastroesophageal reflux disease)   . Headache   . Hirsutism   . Hypertension   . Hypokalemia   . Neutrophilic leukocytosis   . Periodontal disease   . Stomach ulcer   . Stroke (HCC)   . Vitamin D deficiency disease    Relevant past medical, surgical, family and social history reviewed and updated as indicated. Interim medical history since our last visit reviewed.  Review of Systems  Constitutional: Negative.  Negative for activity change, appetite change, chills, fatigue and fever.  Respiratory: Negative.   Cardiovascular: Negative.   Gastrointestinal: Positive for abdominal pain and constipation. Negative for abdominal distention, blood in stool, diarrhea, nausea and vomiting.       +heartburn  Genitourinary: Positive for decreased urine volume, difficulty urinating, dysuria, flank pain, frequency and urgency. Negative for dyspareunia, enuresis, genital sores, hematuria, pelvic pain, vaginal bleeding, vaginal discharge and vaginal pain.  Musculoskeletal: Positive for back pain. Negative for arthralgias, gait problem, joint swelling and myalgias.  Skin: Negative.  Negative for color change and rash.  Neurological: Negative.     Psychiatric/Behavioral: Negative.     Per HPI unless specifically indicated above     Objective:    BP (!) 142/87   Pulse 85   Temp 99.3 F (37.4 C)   Wt 228 lb 8 oz (103.6 kg)   SpO2 96%   BMI 36.88 kg/m   Wt Readings from Last 3 Encounters:  09/09/20 228 lb 8 oz (103.6 kg)  08/16/20 220 lb (99.8 kg)  07/15/20 225 lb (102.1 kg)    Physical Exam Vitals reviewed.  Constitutional:      General: She is not in acute distress.    Appearance: Normal appearance. She is obese. She is not toxic-appearing.  Eyes:     General: No scleral icterus.       Right eye: No discharge.        Left eye: No discharge.     Extraocular Movements: Extraocular movements intact.  Cardiovascular:     Rate and Rhythm: Normal rate and regular rhythm.     Heart sounds: Normal heart sounds. No murmur heard.   Pulmonary:     Effort: Pulmonary effort is normal. No respiratory distress.     Breath sounds: Normal breath sounds. No  wheezing or rhonchi.  Abdominal:     General: Abdomen is flat. Bowel sounds are normal. There is no distension.     Palpations: Abdomen is soft.     Tenderness: There is abdominal tenderness (LLQ). There is right CVA tenderness and left CVA tenderness. There is no rebound.  Musculoskeletal:        General: Normal range of motion.     Cervical back: Normal range of motion.  Skin:    General: Skin is warm and dry.     Capillary Refill: Capillary refill takes less than 2 seconds.     Coloration: Skin is not jaundiced or pale.  Neurological:     General: No focal deficit present.     Mental Status: She is alert and oriented to person, place, and time.     Motor: No weakness.     Gait: Gait normal.  Psychiatric:        Mood and Affect: Mood normal.        Behavior: Behavior normal.        Thought Content: Thought content normal.        Judgment: Judgment normal.        Assessment & Plan:   Problem List Items Addressed This Visit      Digestive    Gastroesophageal reflux disease    Chronic, ongoing.  With previous reported stomach ulcers, will start daily PPI and follow up in 6 weeks for trial of discontinuance.  No red flags in history or on examination.  If symptoms do not improve, return to clinic sooner.        Relevant Medications   pantoprazole (PROTONIX) 40 MG tablet   polyethylene glycol powder (GLYCOLAX/MIRALAX) 17 GM/SCOOP powder     Other   Dysuria - Primary    Acute, ongoing x 3 weeks.  UA today showed 3+ blood, although patient is on menses, trace leukocytes and few bacteria.  Given new development of new back pain, will treat for UTI and send culture and await results.  Last UTI in July sensitive to Bactrim; will treat with for 5 days.  Also cannot rule out kidney stone, encouraged hydration with plenty of water.  With any nausea/vomiting and unable to keep fluids down, go to ED.  Follow up in 6 weeks.        Relevant Orders   Urinalysis, Routine w reflex microscopic   Other constipation    Unclear how long this has been a problem for.  Likely cause of lower abdominal pain.  No red flags in history or on examination.  Will treat with Miralax 1-2 times daily with goal of having long, sausage-like bowel movement per day.  Follow up in 6 weeks.      Relevant Medications   polyethylene glycol powder (GLYCOLAX/MIRALAX) 17 GM/SCOOP powder    Other Visit Diagnoses    Unwanted hair       Relevant Orders   Ambulatory referral to Dermatology   Pyuria       Relevant Orders   Urine Culture       Follow up plan: Return in about 6 weeks (around 10/21/2020) for gerd, constipation f/u.

## 2020-09-09 NOTE — Assessment & Plan Note (Signed)
Unclear how long this has been a problem for.  Likely cause of lower abdominal pain.  No red flags in history or on examination.  Will treat with Miralax 1-2 times daily with goal of having long, sausage-like bowel movement per day.  Follow up in 6 weeks.

## 2020-09-12 LAB — URINE CULTURE

## 2020-09-28 ENCOUNTER — Other Ambulatory Visit: Payer: Self-pay

## 2020-09-28 MED ORDER — SPIRONOLACTONE 25 MG PO TABS
25.0000 mg | ORAL_TABLET | Freq: Every day | ORAL | 1 refills | Status: DC
Start: 2020-09-28 — End: 2020-10-29

## 2020-09-28 NOTE — Telephone Encounter (Signed)
Will get her refill, needs 6 month follow up for January

## 2020-10-29 ENCOUNTER — Encounter: Payer: Self-pay | Admitting: Family Medicine

## 2020-10-29 ENCOUNTER — Telehealth (INDEPENDENT_AMBULATORY_CARE_PROVIDER_SITE_OTHER): Admitting: Family Medicine

## 2020-10-29 DIAGNOSIS — I10 Essential (primary) hypertension: Secondary | ICD-10-CM

## 2020-10-29 DIAGNOSIS — K219 Gastro-esophageal reflux disease without esophagitis: Secondary | ICD-10-CM

## 2020-10-29 DIAGNOSIS — E559 Vitamin D deficiency, unspecified: Secondary | ICD-10-CM

## 2020-10-29 DIAGNOSIS — R7301 Impaired fasting glucose: Secondary | ICD-10-CM

## 2020-10-29 DIAGNOSIS — F32A Depression, unspecified: Secondary | ICD-10-CM

## 2020-10-29 DIAGNOSIS — F419 Anxiety disorder, unspecified: Secondary | ICD-10-CM

## 2020-10-29 DIAGNOSIS — Z1211 Encounter for screening for malignant neoplasm of colon: Secondary | ICD-10-CM

## 2020-10-29 DIAGNOSIS — F410 Panic disorder [episodic paroxysmal anxiety] without agoraphobia: Secondary | ICD-10-CM

## 2020-10-29 MED ORDER — ALPRAZOLAM 0.5 MG PO TABS: 0.5000 mg | ORAL_TABLET | Freq: Every evening | ORAL | 0 refills | Status: DC | PRN

## 2020-10-29 MED ORDER — FLUTICASONE PROPIONATE 50 MCG/ACT NA SUSP: 1.0000 | Freq: Two times a day (BID) | NASAL | 5 refills | Status: AC

## 2020-10-29 MED ORDER — TRAZODONE HCL 50 MG PO TABS: 25.0000 mg | ORAL_TABLET | Freq: Every evening | ORAL | 3 refills | Status: DC | PRN

## 2020-10-29 MED ORDER — SPIRONOLACTONE 25 MG PO TABS: 25.0000 mg | ORAL_TABLET | Freq: Every day | ORAL | 1 refills | Status: DC

## 2020-10-29 MED ORDER — ATORVASTATIN CALCIUM 80 MG PO TABS: 80.0000 mg | ORAL_TABLET | Freq: Every day | ORAL | 1 refills | Status: DC

## 2020-10-29 MED ORDER — PANTOPRAZOLE SODIUM 40 MG PO TBEC: 40.0000 mg | DELAYED_RELEASE_TABLET | Freq: Every day | ORAL | 1 refills | Status: DC

## 2020-10-29 MED ORDER — ESCITALOPRAM OXALATE 10 MG PO TABS: 10.0000 mg | ORAL_TABLET | Freq: Every day | ORAL | 3 refills | Status: DC

## 2020-10-29 MED ORDER — BUPROPION HCL ER (SMOKING DET) 150 MG PO TB12
150.0000 mg | ORAL_TABLET | Freq: Two times a day (BID) | ORAL | 3 refills | Status: DC
Start: 2020-10-29 — End: 2021-01-31

## 2020-10-29 MED ORDER — CLOPIDOGREL BISULFATE 75 MG PO TABS: 75.0000 mg | ORAL_TABLET | Freq: Every day | ORAL | 1 refills | Status: DC

## 2020-10-29 NOTE — Assessment & Plan Note (Signed)
Following with psychiatry, but needs a new one due to insurance. New referral generated today. Bridging medicine sent to her pharmacy. Increasing buproprion to BID. Follow up 1 month.

## 2020-10-29 NOTE — Assessment & Plan Note (Signed)
Under good control on current regimen. Continue current regimen. Continue to monitor. Call with any concerns. Refills given. Labs to be drawn next week.   

## 2020-10-29 NOTE — Assessment & Plan Note (Signed)
Under good control on current regimen. Continue current regimen. Continue to monitor. Call with any concerns. Refills given. Will come in next week for labs.

## 2020-10-29 NOTE — Assessment & Plan Note (Signed)
Following with psychiatry, but needs a new one due to insurance. New referral generated today. Bridging medicine sent to her pharmacy. Follow up 1 month.

## 2020-10-29 NOTE — Progress Notes (Signed)
There were no vitals taken for this visit.   Subjective:    Patient ID: Latasha Lawrence, female    DOB: 1971/04/12, 50 y.o.   MRN: [id number]  HPI: Latasha Lawrence is a 50 y.o. female  Chief Complaint  Patient presents with  . Hypertension  . Depression  . Anxiety   HYPERTENSION / HYPERLIPIDEMIA Satisfied with current treatment? yes Duration of hypertension: chronic BP monitoring frequency: rarely BP range: 140/80s BP medication side effects: no Past BP meds: spironalactone Duration of hyperlipidemia: chronic Cholesterol medication side effects: no Cholesterol supplements: none Past cholesterol medications: atorvastatin Medication compliance: excellent compliance Aspirin: no Recent stressors: no Recurrent headaches: no Visual changes: no Palpitations: no Dyspnea: no Chest pain: no Lower extremity edema: no Dizzy/lightheaded: no  ANXIETY/DEPRESSION- follows with psychiatry, but her insurance is not covering her current psychiatrist Duration: chronic Status:stable Anxious mood: yes  Excessive worrying: no Irritability: no  Sweating: no Nausea: no Palpitations:no Hyperventilation: no Panic attacks: no Agoraphobia: no  Obscessions/compulsions: no Depressed mood: yes Depression screen [facility] 2/9 10/29/2020 07/15/2020 04/14/2020 02/19/2020  Decreased Interest 3 1 2  0  Down, Depressed, Hopeless 3 3 2  0  PHQ - 2 Score 6 4 4  0  Altered sleeping 3 3 3  -  Tired, decreased energy 3 3 3  -  Change in appetite 3 2 1  -  Feeling bad or failure about yourself  3 2 1  -  Trouble concentrating 3 2 2  -  Moving slowly or fidgety/restless 0 0 0 -  Suicidal thoughts 0 1 0 -  PHQ-9 Score 21 17 14  -  Difficult doing work/chores Extremely dIfficult Very difficult - -   Anhedonia: no Weight changes: no Insomnia: yes   Hypersomnia: no Fatigue/loss of energy: yes Feelings of worthlessness: yes Feelings of guilt: yes Impaired concentration/indecisiveness: yes Suicidal ideations: no   Crying spells: no Recent Stressors/Life Changes: no   Relationship problems: no   Family stress: no     Financial stress: no    Job stress: no    Recent death/loss: no  Relevant past medical, surgical, family and social history reviewed and updated as indicated. Interim medical history since our last visit reviewed. Allergies and medications reviewed and updated.  Review of Systems  Constitutional: Negative.   HENT: Negative.   Respiratory: Negative.   Cardiovascular: Negative.   Gastrointestinal: Negative.   Musculoskeletal: Negative.   Skin: Negative.   Psychiatric/Behavioral: Positive for dysphoric mood and sleep disturbance. Negative for agitation, behavioral problems, confusion, decreased concentration, hallucinations, self-injury and suicidal ideas. The patient is nervous/anxious. The patient is not hyperactive.     Per HPI unless specifically indicated above     Objective:    There were no vitals taken for this visit.  Wt Readings from Last 3 Encounters:  09/09/20 228 lb 8 oz (103.6 kg)  08/16/20 220 lb (99.8 kg)  07/15/20 225 lb (102.1 kg)    Physical Exam Vitals and nursing note reviewed.  Pulmonary:     Effort: Pulmonary effort is normal. No respiratory distress.     Comments: Speaking in full sentences Neurological:     Mental Status: She is alert.  Psychiatric:        Mood and Affect: Mood normal.        Behavior: Behavior normal.        Thought Content: Thought content normal.        Judgment: Judgment normal.     Results for orders placed or performed in visit on 09/09/20  Urine Culture   Specimen: Urine   UR  Result Value Ref Range   Urine Culture, Routine Final report (A)    Organism ID, Bacteria Escherichia coli (A)    Antimicrobial Susceptibility Comment   Microscopic Examination   Urine  Result Value Ref Range   WBC, UA 0-5 0 - 5 /hpf   RBC >30 (A) 0 - 2 /hpf   Epithelial Cells (non renal) 0-10 0 - 10 /hpf   Bacteria, UA Few (A) None  seen/Few  Urinalysis, Routine w reflex microscopic  Result Value Ref Range   Specific Gravity, UA 1.015 1.005 - 1.030   pH, UA 5.0 5.0 - 7.5   Color, UA Yellow Yellow   Appearance Ur Hazy (A) Clear   Leukocytes,UA Trace (A) Negative   Protein,UA Negative Negative/Trace   Glucose, UA Negative Negative   Ketones, UA Negative Negative   RBC, UA 3+ (A) Negative   Bilirubin, UA Negative Negative   Urobilinogen, Ur 0.2 0.2 - 1.0 mg/dL   Nitrite, UA Negative Negative   Microscopic Examination See below:       Assessment & Plan:   Problem List Items Addressed This Visit      Cardiovascular and Mediastinum   Hypertension    Under good control on current regimen. Continue current regimen. Continue to monitor. Call with any concerns. Refills given. Will come in next week for labs.        Relevant Medications   spironolactone (ALDACTONE) 25 MG tablet   atorvastatin (LIPITOR) 80 MG tablet   Other Relevant Orders   Comprehensive metabolic panel     Digestive   Gastroesophageal reflux disease    Under good control on current regimen. Continue current regimen. Continue to monitor. Call with any concerns. Refills given. Labs to be drawn next week.        Relevant Medications   pantoprazole (PROTONIX) 40 MG tablet     Endocrine   IFG (impaired fasting glucose)    Will check labs next week. Await results.       Relevant Orders   Comprehensive metabolic panel   Lipid Panel w/o Chol/HDL Ratio   Bayer DCA Hb A1c Waived     Other   Vitamin D deficiency disease    Will check labs next week. Await results.       Relevant Orders   Comprehensive metabolic panel   VITAMIN D 25 Hydroxy (Vit-D Deficiency, Fractures)   Anxiety and depression - Primary    Following with psychiatry, but needs a new one due to insurance. New referral generated today. Bridging medicine sent to her pharmacy. Increasing buproprion to BID. Follow up 1 month.       Relevant Medications   ALPRAZolam (XANAX)  0.5 MG tablet   escitalopram (LEXAPRO) 10 MG tablet   traZODone (DESYREL) 50 MG tablet   Other Relevant Orders   Ambulatory referral to Psychiatry   Panic disorder    Following with psychiatry, but needs a new one due to insurance. New referral generated today. Bridging medicine sent to her pharmacy. Follow up 1 month.       Relevant Medications   ALPRAZolam (XANAX) 0.5 MG tablet   escitalopram (LEXAPRO) 10 MG tablet   traZODone (DESYREL) 50 MG tablet   Other Relevant Orders   Ambulatory referral to Psychiatry    Other Visit Diagnoses    Essential hypertension       Relevant Medications   spironolactone (ALDACTONE) 25 MG tablet   atorvastatin (LIPITOR)  80 MG tablet   Other Relevant Orders   Comprehensive metabolic panel   Screening for colon cancer       Referral to GI made today.   Relevant Orders   Ambulatory referral to Gastroenterology       Follow up plan: Return in about 4 weeks (around 11/26/2020).   . This visit was completed via telephone due to the restrictions of the COVID-19 pandemic. All issues as above were discussed and addressed but no physical exam was performed. If it was felt that the patient should be evaluated in the office, they were directed there. The patient verbally consented to this visit. Patient was unable to complete an audio/visual visit due to Lack of equipment. Due to the catastrophic nature of the COVID-19 pandemic, this visit was done through audio contact only. . Location of the patient: home . Location of the provider: home . Those involved with this call:  . Provider: Olevia Perches, DO . CMA: Tiffany Reel, CMA . Front Desk/Registration: Harriet Pho  . Time spent on call: 25 minutes on the phone discussing health concerns. 40 minutes total spent in review of patient's record and preparation of their chart.

## 2020-10-29 NOTE — Assessment & Plan Note (Signed)
Will check labs next week. Await results.

## 2020-10-29 NOTE — Assessment & Plan Note (Signed)
Will check labs next week. Await results.  °

## 2020-11-01 ENCOUNTER — Telehealth: Payer: Self-pay

## 2020-11-01 NOTE — Telephone Encounter (Signed)
-----   Message from Dorcas Carrow, DO sent at 10/29/2020  3:16 PM EST ----- 4 weeks with new PCP

## 2020-11-01 NOTE — Telephone Encounter (Signed)
Lvm to make this apt. 

## 2020-11-02 NOTE — Telephone Encounter (Signed)
Lvm to make this apt. 

## 2020-11-03 ENCOUNTER — Telehealth: Payer: Self-pay

## 2020-11-03 NOTE — Telephone Encounter (Signed)
Pt would like a update on referral psychiatry

## 2020-11-03 NOTE — Telephone Encounter (Signed)
For some reason this did not fall in my work queue. I did call the patient and make her aware that I did not send this until today. I did apologize and she is okay with this. Gave her my work number and told her to call me if she didn't hear anything with in a week.

## 2020-12-02 ENCOUNTER — Ambulatory Visit: Admitting: Nurse Practitioner

## 2020-12-15 ENCOUNTER — Encounter: Payer: Self-pay | Admitting: *Deleted

## 2020-12-15 ENCOUNTER — Other Ambulatory Visit: Payer: Self-pay

## 2020-12-15 ENCOUNTER — Ambulatory Visit: Admitting: Gastroenterology

## 2020-12-27 ENCOUNTER — Ambulatory Visit: Admitting: Nurse Practitioner

## 2020-12-27 NOTE — Progress Notes (Deleted)
There were no vitals taken for this visit.   Subjective:    Patient ID: Latasha Lawrence, female    DOB: 1970/12/03, 50 y.o.   MRN: [id number]  HPI: Latasha Lawrence is a 50 y.o. female  No chief complaint on file.  HYPERTENSION Hypertension status: {Blank single:19197::"controlled","uncontrolled","better","worse","exacerbated","stable"}  Satisfied with current treatment? {Blank single:19197::"yes","no"} Duration of hypertension: {Blank single:19197::"chronic","months","years"} BP monitoring frequency:  {Blank single:19197::"not checking","rarely","daily","weekly","monthly","a few times a day","a few times a week","a few times a month"} BP range:  BP medication side effects:  {Blank single:19197::"yes","no"} Medication compliance: {Blank single:19197::"excellent compliance","good compliance","fair compliance","poor compliance"} Previous BP meds:{Blank multiple:19196::"none","amlodipine","amlodipine/benazepril","atenolol","benazepril","benazepril/HCTZ","bisoprolol (bystolic)","carvedilol","chlorthalidone","clonidine","diltiazem","exforge HCT","HCTZ","irbesartan (avapro)","labetalol","lisinopril","lisinopril-HCTZ","losartan (cozaar)","methyldopa","nifedipine","olmesartan (benicar)","olmesartan-HCTZ","quinapril","ramipril","spironalactone","tekturna","valsartan","valsartan-HCTZ","verapamil"} Aspirin: {Blank single:19197::"yes","no"} Recurrent headaches: {Blank single:19197::"yes","no"} Visual changes: {Blank single:19197::"yes","no"} Palpitations: {Blank single:19197::"yes","no"} Dyspnea: {Blank single:19197::"yes","no"} Chest pain: {Blank single:19197::"yes","no"} Lower extremity edema: {Blank single:19197::"yes","no"} Dizzy/lightheaded: {Blank single:19197::"yes","no"}  DEPRESSION Patient was seen one month ago and needed new referral to Psychiatry due to insurance.  Mood status: {Blank single:19197::"controlled","uncontrolled","better","worse","exacerbated","stable"} Satisfied with  current treatment?: {Blank single:19197::"yes","no"} Symptom severity: {Blank single:19197::"mild","moderate","severe"}  Duration of current treatment : {Blank single:19197::"chronic","months","years"} Side effects: {Blank single:19197::"yes","no"} Medication compliance: {Blank single:19197::"excellent compliance","good compliance","fair compliance","poor compliance"} Psychotherapy/counseling: {Blank single:19197::"yes","no"} {Blank single:19197::"current","in the past"} Previous psychiatric medications: {Blank multiple:19196::"abilify","amitryptiline","buspar","celexa","cymbalta","depakote","effexor","lamictal","lexapro","lithium","nortryptiline","paxil","prozac","pristiq (desvenlafaxine","seroquel","wellbutrin","zoloft","zyprexa"} Depressed mood: {Blank single:19197::"yes","no"} Anxious mood: {Blank single:19197::"yes","no"} Anhedonia: {Blank single:19197::"yes","no"} Significant weight loss or gain: {Blank single:19197::"yes","no"} Insomnia: {Blank single:19197::"yes","no"} {Blank single:19197::"hard to fall asleep","hard to stay asleep"} Fatigue: {Blank single:19197::"yes","no"} Feelings of worthlessness or guilt: {Blank single:19197::"yes","no"} Impaired concentration/indecisiveness: {Blank single:19197::"yes","no"} Suicidal ideations: {Blank single:19197::"yes","no"} Hopelessness: {Blank single:19197::"yes","no"} Crying spells: {Blank single:19197::"yes","no"} Depression screen [facility] 2/9 10/29/2020 07/15/2020 04/14/2020 02/19/2020  Decreased Interest 3 1 2  0  Down, Depressed, Hopeless 3 3 2  0  PHQ - 2 Score 6 4 4  0  Altered sleeping 3 3 3  -  Tired, decreased energy 3 3 3  -  Change in appetite 3 2 1  -  Feeling bad or failure about yourself  3 2 1  -  Trouble concentrating 3 2 2  -  Moving slowly or fidgety/restless 0 0 0 -  Suicidal thoughts 0 1 0 -  PHQ-9 Score 21 17 14  -  Difficult doing work/chores Extremely dIfficult Very difficult - -    IMPAIRED FASTING GLUCOSE  SUBSTANCE  ABUSE Patient continues on Suboxone for substance abuse.   Relevant past medical, surgical, family and social history reviewed and updated as indicated. Interim medical history since our last visit reviewed. Allergies and medications reviewed and updated.  Review of Systems  Per HPI unless specifically indicated above     Objective:    There were no vitals taken for this visit.  Wt Readings from Last 3 Encounters:  09/09/20 228 lb 8 oz (103.6 kg)  08/16/20 220 lb (99.8 kg)  07/15/20 225 lb (102.1 kg)    Physical Exam  Results for orders placed or performed in visit on 09/09/20  Urine Culture   Specimen: Urine   UR  Result Value Ref Range   Urine Culture, Routine Final report (A)    Organism ID, Bacteria Escherichia coli (A)    Antimicrobial Susceptibility Comment   Microscopic Examination   Urine  Result Value Ref Range   WBC, UA 0-5 0 - 5 /hpf   RBC >30 (A) 0 - 2 /hpf   Epithelial Cells (non renal) 0-10 0 - 10 /hpf   Bacteria, UA Few (A) None seen/Few  Urinalysis, Routine w reflex microscopic  Result Value Ref Range   Specific Gravity, UA 1.015 1.005 - 1.030   pH, UA 5.0 5.0 - 7.5  Color, UA Yellow Yellow   Appearance Ur Hazy (A) Clear   Leukocytes,UA Trace (A) Negative   Protein,UA Negative Negative/Trace   Glucose, UA Negative Negative   Ketones, UA Negative Negative   RBC, UA 3+ (A) Negative   Bilirubin, UA Negative Negative   Urobilinogen, Ur 0.2 0.2 - 1.0 mg/dL   Nitrite, UA Negative Negative   Microscopic Examination See below:       Assessment & Plan:   Problem List Items Addressed This Visit   None      Follow up plan: No follow-ups on file.

## 2020-12-30 ENCOUNTER — Ambulatory Visit: Admitting: Nurse Practitioner

## 2020-12-30 DIAGNOSIS — E785 Hyperlipidemia, unspecified: Secondary | ICD-10-CM | POA: Insufficient documentation

## 2020-12-30 NOTE — Assessment & Plan Note (Deleted)
Ongoing. Will check labs next week. Await results.

## 2020-12-30 NOTE — Assessment & Plan Note (Deleted)
Under good control on current regimen. Continue current regimen. Continue to monitor. Call with any concerns. Refills given.  Labs ordered today.

## 2020-12-30 NOTE — Assessment & Plan Note (Deleted)
Chronic.  Labs ordered today.  Await results. Will make recommendations based on lab results.

## 2020-12-30 NOTE — Assessment & Plan Note (Deleted)
Chronic.  Labs ordered today. Continue with current medication regimen. Follow up in 3 months.

## 2020-12-30 NOTE — Progress Notes (Deleted)
There were no vitals taken for this visit.   Subjective:    Patient ID: Latasha Lawrence, female    DOB: [birth year], 50 y.o.   MRN: [id number]  HPI: Latasha Lawrence is a 50 y.o. female  No chief complaint on file.  HYPERTENSION Hypertension status: {Blank single:19197::"controlled","uncontrolled","better","worse","exacerbated","stable"}  Satisfied with current treatment? {Blank single:19197::"yes","no"} Duration of hypertension: {Blank single:19197::"chronic","months","years"} BP monitoring frequency:  {Blank single:19197::"not checking","rarely","daily","weekly","monthly","a few times a day","a few times a week","a few times a month"} BP range:  BP medication side effects:  {Blank single:19197::"yes","no"} Medication compliance: {Blank single:19197::"excellent compliance","good compliance","fair compliance","poor compliance"} Previous BP meds:{Blank multiple:19196::"none","amlodipine","amlodipine/benazepril","atenolol","benazepril","benazepril/HCTZ","bisoprolol (bystolic)","carvedilol","chlorthalidone","clonidine","diltiazem","exforge HCT","HCTZ","irbesartan (avapro)","labetalol","lisinopril","lisinopril-HCTZ","losartan (cozaar)","methyldopa","nifedipine","olmesartan (benicar)","olmesartan-HCTZ","quinapril","ramipril","spironalactone","tekturna","valsartan","valsartan-HCTZ","verapamil"} Aspirin: {Blank single:19197::"yes","no"} Recurrent headaches: {Blank single:19197::"yes","no"} Visual changes: {Blank single:19197::"yes","no"} Palpitations: {Blank single:19197::"yes","no"} Dyspnea: {Blank single:19197::"yes","no"} Chest pain: {Blank single:19197::"yes","no"} Lower extremity edema: {Blank single:19197::"yes","no"} Dizzy/lightheaded: {Blank single:19197::"yes","no"}  DEPRESSION Patient was seen one month ago and needed new referral to Psychiatry due to insurance.  Mood status: {Blank single:19197::"controlled","uncontrolled","better","worse","exacerbated","stable"} Satisfied with  current treatment?: {Blank single:19197::"yes","no"} Symptom severity: {Blank single:19197::"mild","moderate","severe"}  Duration of current treatment : {Blank single:19197::"chronic","months","years"} Side effects: {Blank single:19197::"yes","no"} Medication compliance: {Blank single:19197::"excellent compliance","good compliance","fair compliance","poor compliance"} Psychotherapy/counseling: {Blank single:19197::"yes","no"} {Blank single:19197::"current","in the past"} Previous psychiatric medications: {Blank multiple:19196::"abilify","amitryptiline","buspar","celexa","cymbalta","depakote","effexor","lamictal","lexapro","lithium","nortryptiline","paxil","prozac","pristiq (desvenlafaxine","seroquel","wellbutrin","zoloft","zyprexa"} Depressed mood: {Blank single:19197::"yes","no"} Anxious mood: {Blank single:19197::"yes","no"} Anhedonia: {Blank single:19197::"yes","no"} Significant weight loss or gain: {Blank single:19197::"yes","no"} Insomnia: {Blank single:19197::"yes","no"} {Blank single:19197::"hard to fall asleep","hard to stay asleep"} Fatigue: {Blank single:19197::"yes","no"} Feelings of worthlessness or guilt: {Blank single:19197::"yes","no"} Impaired concentration/indecisiveness: {Blank single:19197::"yes","no"} Suicidal ideations: {Blank single:19197::"yes","no"} Hopelessness: {Blank single:19197::"yes","no"} Crying spells: {Blank single:19197::"yes","no"} Depression screen [facility] 2/9 10/29/2020 07/15/2020 04/14/2020 02/19/2020  Decreased Interest 3 1 2  0  Down, Depressed, Hopeless 3 3 2  0  PHQ - 2 Score 6 4 4  0  Altered sleeping 3 3 3  -  Tired, decreased energy 3 3 3  -  Change in appetite 3 2 1  -  Feeling bad or failure about yourself  3 2 1  -  Trouble concentrating 3 2 2  -  Moving slowly or fidgety/restless 0 0 0 -  Suicidal thoughts 0 1 0 -  PHQ-9 Score 21 17 14  -  Difficult doing work/chores Extremely dIfficult Very difficult - -    IMPAIRED FASTING GLUCOSE  SUBSTANCE  ABUSE Patient continues on Suboxone for substance abuse.   Relevant past medical, surgical, family and social history reviewed and updated as indicated. Interim medical history since our last visit reviewed. Allergies and medications reviewed and updated.  Review of Systems  Per HPI unless specifically indicated above     Objective:    There were no vitals taken for this visit.  Wt Readings from Last 3 Encounters:  09/09/20 228 lb 8 oz (103.6 kg)  08/16/20 220 lb (99.8 kg)  07/15/20 225 lb (102.1 kg)    Physical Exam  Results for orders placed or performed in visit on 09/09/20  Urine Culture   Specimen: Urine   UR  Result Value Ref Range   Urine Culture, Routine Final report (A)    Organism ID, Bacteria Escherichia coli (A)    Antimicrobial Susceptibility Comment   Microscopic Examination   Urine  Result Value Ref Range   WBC, UA 0-5 0 - 5 /hpf   RBC >30 (A) 0 - 2 /hpf   Epithelial Cells (non renal) 0-10 0 - 10 /hpf   Bacteria, UA Few (A) None seen/Few  Urinalysis, Routine w reflex microscopic  Result Value Ref Range   Specific Gravity, UA 1.015 1.005 - 1.030   pH, UA 5.0 5.0 - 7.5  Color, UA Yellow Yellow   Appearance Ur Hazy (A) Clear   Leukocytes,UA Trace (A) Negative   Protein,UA Negative Negative/Trace   Glucose, UA Negative Negative   Ketones, UA Negative Negative   RBC, UA 3+ (A) Negative   Bilirubin, UA Negative Negative   Urobilinogen, Ur 0.2 0.2 - 1.0 mg/dL   Nitrite, UA Negative Negative   Microscopic Examination See below:       Assessment & Plan:   Problem List Items Addressed This Visit      Cardiovascular and Mediastinum   Hypertension    Under good control on current regimen. Continue current regimen. Continue to monitor. Call with any concerns. Refills given.  Labs ordered today.         Endocrine   IFG (impaired fasting glucose) - Primary    Ongoing. Will check labs next week. Await results.         Other   Vitamin D deficiency  disease    Chronic.  Labs ordered today.  Await results. Will make recommendations based on lab results.       Hyperlipidemia    Chronic.  Labs ordered today. Continue with current medication regimen. Follow up in 3 months.          Follow up plan: No follow-ups on file.

## 2021-01-31 ENCOUNTER — Encounter: Payer: Self-pay | Admitting: Nurse Practitioner

## 2021-01-31 ENCOUNTER — Ambulatory Visit (INDEPENDENT_AMBULATORY_CARE_PROVIDER_SITE_OTHER): Admitting: Nurse Practitioner

## 2021-01-31 ENCOUNTER — Other Ambulatory Visit: Payer: Self-pay

## 2021-01-31 VITALS — BP 133/85 | Temp 98.8°F | Wt 220.0 lb

## 2021-01-31 DIAGNOSIS — K219 Gastro-esophageal reflux disease without esophagitis: Secondary | ICD-10-CM

## 2021-01-31 DIAGNOSIS — I10 Essential (primary) hypertension: Secondary | ICD-10-CM | POA: Diagnosis not present

## 2021-01-31 DIAGNOSIS — F419 Anxiety disorder, unspecified: Secondary | ICD-10-CM | POA: Diagnosis not present

## 2021-01-31 DIAGNOSIS — I63239 Cerebral infarction due to unspecified occlusion or stenosis of unspecified carotid arteries: Secondary | ICD-10-CM

## 2021-01-31 DIAGNOSIS — F32A Depression, unspecified: Secondary | ICD-10-CM

## 2021-01-31 DIAGNOSIS — M25471 Effusion, right ankle: Secondary | ICD-10-CM

## 2021-01-31 DIAGNOSIS — R7301 Impaired fasting glucose: Secondary | ICD-10-CM

## 2021-01-31 DIAGNOSIS — K047 Periapical abscess without sinus: Secondary | ICD-10-CM

## 2021-01-31 MED ORDER — NICOTINE 14 MG/24HR TD PT24: 14.0000 mg | MEDICATED_PATCH | Freq: Every day | TRANSDERMAL | 0 refills | Status: DC

## 2021-01-31 MED ORDER — HYDROCHLOROTHIAZIDE 25 MG PO TABS: 1.0000 | ORAL_TABLET | Freq: Every day | ORAL | 1 refills | Status: DC

## 2021-01-31 MED ORDER — CLOPIDOGREL BISULFATE 75 MG PO TABS: 75.0000 mg | ORAL_TABLET | Freq: Every day | ORAL | 1 refills | Status: DC

## 2021-01-31 MED ORDER — AMOXICILLIN-POT CLAVULANATE 875-125 MG PO TABS
1.0000 | ORAL_TABLET | Freq: Two times a day (BID) | ORAL | 0 refills | Status: DC
Start: 2021-01-31 — End: 2021-03-03

## 2021-01-31 MED ORDER — HYDROXYZINE PAMOATE 100 MG PO CAPS: 100.0000 mg | ORAL_CAPSULE | Freq: Three times a day (TID) | ORAL | 1 refills | Status: DC | PRN

## 2021-01-31 MED ORDER — SPIRONOLACTONE 25 MG PO TABS: 25.0000 mg | ORAL_TABLET | Freq: Every day | ORAL | 1 refills | Status: DC

## 2021-01-31 MED ORDER — TRAZODONE HCL 100 MG PO TABS: 100.0000 mg | ORAL_TABLET | Freq: Every evening | ORAL | 1 refills | Status: DC | PRN

## 2021-01-31 MED ORDER — LOSARTAN POTASSIUM 50 MG PO TABS: 50.0000 mg | ORAL_TABLET | Freq: Every day | ORAL | 1 refills | Status: DC

## 2021-01-31 MED ORDER — ATORVASTATIN CALCIUM 80 MG PO TABS: 80.0000 mg | ORAL_TABLET | Freq: Every day | ORAL | 1 refills | Status: DC

## 2021-01-31 MED ORDER — PANTOPRAZOLE SODIUM 40 MG PO TBEC: 40.0000 mg | DELAYED_RELEASE_TABLET | Freq: Every day | ORAL | 1 refills | Status: DC

## 2021-01-31 MED ORDER — VENLAFAXINE HCL ER 37.5 MG PO CP24: 37.5000 mg | ORAL_CAPSULE | Freq: Every day | ORAL | 1 refills | Status: DC

## 2021-01-31 NOTE — Assessment & Plan Note (Signed)
Continue current regimen, work on improving lifestyle habits for better control.

## 2021-01-31 NOTE — Progress Notes (Signed)
BP 133/85   Temp 98.8 F (37.1 C)   Wt 220 lb (99.8 kg)   SpO2 96%   BMI 35.51 kg/m    Subjective:    Patient ID: Latasha Lawrence, female    DOB: [birth year], 50 y.o.   MRN: [id number]  HPI: Latasha Lawrence is a 50 y.o. female  No chief complaint on file.  ANXIETY/DEPRESSION Patient would like to go back Effexor.  She has tried Wellbutrin in the past and it did not work for her.  Patient states the Effexor did.  Patient still has some Xanax and does not need a refill right now.  Will return to clinic to sign the controlled substance agreement and leave a UDS when she needs a refill.  Patient states she is no longer taking the Suboxone. Denies SI.  HYPERTENSION Hypertension status: controlled  Satisfied with current treatment? yes Duration of hypertension: years BP monitoring frequency:  not checking BP range:  BP medication side effects:  no Medication compliance: excellent compliance Previous BP meds:HCTZ, losartan (cozaar) and spironalactone Aspirin: no Recurrent headaches: no Visual changes: no Palpitations: no Dyspnea: no Chest pain: no Lower extremity edema: no Dizzy/lightheaded: no   Patient states she stepped off a curb and rolled her ankle 4 days ago. Her right ankle has been swollen and bruised since then.  Patient also has a tooth infection.  It has been swollen for a couple of days but is improved now.     Relevant past medical, surgical, family and social history reviewed and updated as indicated. Interim medical history since our last visit reviewed. Allergies and medications reviewed and updated.  Review of Systems  HENT:       Tooth infection  Eyes: Negative for visual disturbance.  Respiratory: Negative for cough, chest tightness and shortness of breath.   Cardiovascular: Negative for chest pain, palpitations and leg swelling.  Musculoskeletal:       Right ankle bruising and swelling  Neurological: Negative for dizziness and headaches.   Psychiatric/Behavioral: Positive for dysphoric mood. Negative for suicidal ideas. The patient is nervous/anxious.     Per HPI unless specifically indicated above     Objective:    BP 133/85   Temp 98.8 F (37.1 C)   Wt 220 lb (99.8 kg)   SpO2 96%   BMI 35.51 kg/m   Wt Readings from Last 3 Encounters:  01/31/21 220 lb (99.8 kg)  09/09/20 228 lb 8 oz (103.6 kg)  08/16/20 220 lb (99.8 kg)    Physical Exam Vitals and nursing note reviewed.  Constitutional:      General: She is not in acute distress.    Appearance: Normal appearance. She is normal weight. She is not ill-appearing, toxic-appearing or diaphoretic.  HENT:     Head: Normocephalic.     Right Ear: External ear normal.     Left Ear: External ear normal.     Nose: Nose normal.     Mouth/Throat:     Mouth: Mucous membranes are moist.     Pharynx: Oropharynx is clear.  Eyes:     General:        Right eye: No discharge.        Left eye: No discharge.     Extraocular Movements: Extraocular movements intact.     Conjunctiva/sclera: Conjunctivae normal.     Pupils: Pupils are equal, round, and reactive to light.  Cardiovascular:     Rate and Rhythm: Normal rate and regular rhythm.  Heart sounds: No murmur heard.   Pulmonary:     Effort: Pulmonary effort is normal. No respiratory distress.     Breath sounds: Normal breath sounds. No wheezing or rales.  Musculoskeletal:        General: Swelling, tenderness and signs of injury present.     Cervical back: Normal range of motion and neck supple.     Right lower leg: Swelling and tenderness present. Edema present.  Skin:    General: Skin is warm and dry.     Capillary Refill: Capillary refill takes less than 2 seconds.  Neurological:     General: No focal deficit present.     Mental Status: She is alert and oriented to person, place, and time. Mental status is at baseline.  Psychiatric:        Mood and Affect: Mood normal.        Behavior: Behavior normal.         Thought Content: Thought content normal.        Judgment: Judgment normal.     Results for orders placed or performed in visit on 09/09/20  Urine Culture   Specimen: Urine   UR  Result Value Ref Range   Urine Culture, Routine Final report (A)    Organism ID, Bacteria Escherichia coli (A)    Antimicrobial Susceptibility Comment   Microscopic Examination   Urine  Result Value Ref Range   WBC, UA 0-5 0 - 5 /hpf   RBC >30 (A) 0 - 2 /hpf   Epithelial Cells (non renal) 0-10 0 - 10 /hpf   Bacteria, UA Few (A) None seen/Few  Urinalysis, Routine w reflex microscopic  Result Value Ref Range   Specific Gravity, UA 1.015 1.005 - 1.030   pH, UA 5.0 5.0 - 7.5   Color, UA Yellow Yellow   Appearance Ur Hazy (A) Clear   Leukocytes,UA Trace (A) Negative   Protein,UA Negative Negative/Trace   Glucose, UA Negative Negative   Ketones, UA Negative Negative   RBC, UA 3+ (A) Negative   Bilirubin, UA Negative Negative   Urobilinogen, Ur 0.2 0.2 - 1.0 mg/dL   Nitrite, UA Negative Negative   Microscopic Examination See below:       Assessment & Plan:   Problem List Items Addressed This Visit      Cardiovascular and Mediastinum   Hypertension    Under good control on current regimen. Continue current regimen. Continue to monitor. Call with any concerns. Refills given. Labs ordered today.        Relevant Medications   spironolactone (ALDACTONE) 25 MG tablet   losartan (COZAAR) 50 MG tablet   hydrochlorothiazide (HYDRODIURIL) 25 MG tablet   atorvastatin (LIPITOR) 80 MG tablet   Other Relevant Orders   Comp Met (CMET)   Lipid Profile   Carotid stenosis, symptomatic, with infarction ([place]) - Primary    Continue current regimen, work on improving lifestyle habits for better control.       Relevant Medications   spironolactone (ALDACTONE) 25 MG tablet   losartan (COZAAR) 50 MG tablet   hydrochlorothiazide (HYDRODIURIL) 25 MG tablet   atorvastatin (LIPITOR) 80 MG tablet   Other  Relevant Orders   Lipid Profile     Digestive   Gastroesophageal reflux disease    Under good control on current regimen. Continue current regimen. Continue to monitor. Call with any concerns. Refills given.       Relevant Medications   pantoprazole (PROTONIX) 40 MG tablet  Endocrine   IFG (impaired fasting glucose)    Labs ordered.  Will make recommendations based on results.        Relevant Orders   HgB A1c     Other   Anxiety and depression    Patient requested to go back on Effexor.  The Wellbutrin did not work for her. Patient did not need a refill on her Xanax.  Will follow up in office when she needs a refill and will sign the controlled substance agreement and leave a UDS.      Relevant Medications   traZODone (DESYREL) 100 MG tablet   hydrOXYzine (VISTARIL) 100 MG capsule   venlafaxine XR (EFFEXOR XR) 37.5 MG 24 hr capsule    Other Visit Diagnoses    Right ankle swelling       Recommend getting Xray. RICE. Will make further recommendations based on imaging results.    Relevant Orders   DG Ankle Complete Right   Tooth infection       Complete course of antibiotics. Recommend seeing a dentist.    Relevant Medications   amoxicillin-clavulanate (AUGMENTIN) 875-125 MG tablet       Follow up plan: Return in about 1 month (around 03/02/2021) for Smoking Cessation.

## 2021-01-31 NOTE — Assessment & Plan Note (Signed)
Labs ordered.  Will make recommendations based on results.

## 2021-01-31 NOTE — Patient Instructions (Signed)
e

## 2021-01-31 NOTE — Assessment & Plan Note (Signed)
Patient requested to go back on Effexor.  The Wellbutrin did not work for her. Patient did not need a refill on her Xanax.  Will follow up in office when she needs a refill and will sign the controlled substance agreement and leave a UDS.

## 2021-01-31 NOTE — Assessment & Plan Note (Signed)
Under good control on current regimen. Continue current regimen. Continue to monitor. Call with any concerns. Refills given.   

## 2021-01-31 NOTE — Assessment & Plan Note (Signed)
Under good control on current regimen. Continue current regimen. Continue to monitor. Call with any concerns. Refills given.  Labs ordered today.  

## 2021-02-01 LAB — LIPID PANEL
Chol/HDL Ratio: 2.8 ratio (ref 0.0–4.4)
Cholesterol, Total: 121 mg/dL (ref 100–199)
HDL: 43 mg/dL (ref >39)
LDL Chol Calc (NIH): 59 mg/dL (ref 0–99)
Triglycerides: 104 mg/dL (ref 0–149)
VLDL Cholesterol Cal: 19 mg/dL (ref 5–40)

## 2021-02-01 LAB — COMPREHENSIVE METABOLIC PANEL
ALT: 16 IU/L (ref 0–32)
AST: 17 IU/L (ref 0–40)
Albumin/Globulin Ratio: 1.5 (ref 1.2–2.2)
Albumin: 4.3 g/dL (ref 3.8–4.8)
Alkaline Phosphatase: 87 IU/L (ref 44–121)
BUN/Creatinine Ratio: 10 (ref 9–23)
BUN: 8 mg/dL (ref 6–24)
Bilirubin Total: 0.5 mg/dL (ref 0.0–1.2)
CO2: 17 mmol/L — ABNORMAL LOW (ref 20–29)
Calcium: 9.5 mg/dL (ref 8.7–10.2)
Chloride: 99 mmol/L (ref 96–106)
Creatinine, Ser: 0.79 mg/dL (ref 0.57–1.00)
Globulin, Total: 2.8 g/dL (ref 1.5–4.5)
Glucose: 113 mg/dL — ABNORMAL HIGH (ref 65–99)
Potassium: 4.4 mmol/L (ref 3.5–5.2)
Sodium: 138 mmol/L (ref 134–144)
Total Protein: 7.1 g/dL (ref 6.0–8.5)
eGFR: 92 mL/min/{1.73_m2} (ref >59)

## 2021-02-01 LAB — HEMOGLOBIN A1C
Est. average glucose Bld gHb Est-mCnc: 108 mg/dL
Hgb A1c MFr Bld: 5.4 % (ref 4.8–5.6)

## 2021-02-01 NOTE — Progress Notes (Signed)
Please let patient know that her lab work looks good.  Cholesterol and A1c are well controlled.  Continue with current medication regimen and follow up as discussed.

## 2021-02-07 ENCOUNTER — Ambulatory Visit
Admission: RE | Admit: 2021-02-07 | Discharge: 2021-02-07 | Disposition: A | Discharge status: Home / Self Care | Attending: Nurse Practitioner | Admitting: Nurse Practitioner

## 2021-02-07 ENCOUNTER — Ambulatory Visit
Admission: RE | Admit: 2021-02-07 | Discharge: 2021-02-07 | Disposition: A | Discharge status: Home / Self Care | Source: Ambulatory Visit | Attending: Nurse Practitioner | Admitting: Nurse Practitioner

## 2021-02-07 ENCOUNTER — Other Ambulatory Visit: Payer: Self-pay

## 2021-02-07 DIAGNOSIS — M25471 Effusion, right ankle: Secondary | ICD-10-CM | POA: Diagnosis present

## 2021-02-07 DIAGNOSIS — R040 Epistaxis: Secondary | ICD-10-CM | POA: Insufficient documentation

## 2021-02-07 DIAGNOSIS — R413 Other amnesia: Secondary | ICD-10-CM | POA: Insufficient documentation

## 2021-02-08 ENCOUNTER — Telehealth (INDEPENDENT_AMBULATORY_CARE_PROVIDER_SITE_OTHER): Payer: Self-pay

## 2021-02-09 NOTE — Telephone Encounter (Signed)
Documentation only.

## 2021-02-09 NOTE — Progress Notes (Signed)
Please let patient know that there was no fracture seen on xray.  However there is swelling which we new.  I do recommend she see Orthopedics to make sure there is no further intervention that should be done.  I have placed the referral.

## 2021-02-09 NOTE — Addendum Note (Signed)
Addended by: Larae Grooms on: 02/09/2021 09:15 AM   Modules accepted: Orders

## 2021-02-11 ENCOUNTER — Encounter: Payer: Self-pay | Admitting: Nurse Practitioner

## 2021-02-17 ENCOUNTER — Other Ambulatory Visit: Payer: Self-pay | Admitting: Nurse Practitioner

## 2021-02-24 ENCOUNTER — Ambulatory Visit: Admitting: Dermatology

## 2021-02-25 ENCOUNTER — Other Ambulatory Visit (INDEPENDENT_AMBULATORY_CARE_PROVIDER_SITE_OTHER): Payer: Self-pay | Admitting: Vascular Surgery

## 2021-02-25 DIAGNOSIS — I6523 Occlusion and stenosis of bilateral carotid arteries: Secondary | ICD-10-CM

## 2021-02-28 ENCOUNTER — Encounter (INDEPENDENT_AMBULATORY_CARE_PROVIDER_SITE_OTHER): Admitting: Vascular Surgery

## 2021-02-28 ENCOUNTER — Encounter (INDEPENDENT_AMBULATORY_CARE_PROVIDER_SITE_OTHER)

## 2021-03-02 ENCOUNTER — Encounter (INDEPENDENT_AMBULATORY_CARE_PROVIDER_SITE_OTHER): Payer: Self-pay | Admitting: Vascular Surgery

## 2021-03-02 NOTE — Progress Notes (Signed)
BP 138/80   Pulse 91   Temp 98.8 F (37.1 C) (Oral)   Wt 214 lb 6.4 oz (97.3 kg)   LMP 01/21/2021 (Approximate)   SpO2 98%   BMI 34.61 kg/m    Subjective:    Patient ID: Latasha Lawrence, female    DOB: 27-Jul-1971, 50 y.o.   MRN: [id number]  HPI: Latasha Lawrence is a 50 y.o. female  Chief Complaint  Patient presents with  . Nicotine Dependence    1 month f/up  . Insomnia    Pt states she does not feel that the trazadone is helping like it should    SMOKING CESSATION Patient has decreased the amount that she is smoking.  The patches are helping.  Does have a lot of stress in her life.  Has never had the desire to quit smoking but does now.   INSOMNIA: Trazodone is helping some.  She has been on 200mg  in the past for sleep. States she is having trouble staying asleep.   ANXIETY/DEPRESSION: Slightly better since restarting Effexor. Would like to increase dose. Back on Suboxone.  Denies SI. [facility] Visit from 01/31/2021 in [facility]  PHQ-9 Total Score 13     GAD 7 : Generalized Anxiety Score 01/31/2021 10/29/2020 07/15/2020 04/14/2020  Nervous, Anxious, on Edge 3 3 3 3   Control/stop worrying 3 3 2 2   Worry too much - different things 3 3 2 3   Trouble relaxing 2 3 3 3   Restless 1 3 1 1   Easily annoyed or irritable 3 3 3 2   Afraid - awful might happen 2 2 2 2   Total GAD 7 Score 17 20 16 16   Anxiety Difficulty Extremely difficult Extremely difficult Very difficult Extremely difficult    Denies HA, CP, SOB, dizziness, palpitations, visual changes, and lower extremity swelling.   Relevant past medical, surgical, family and social history reviewed and updated as indicated. Interim medical history since our last visit reviewed. Allergies and medications reviewed and updated.  Review of Systems  HENT:       Tooth infection  Eyes: Negative for visual disturbance.  Respiratory: Negative for cough, chest tightness and shortness of breath.    Cardiovascular: Negative for chest pain, palpitations and leg swelling.  Neurological: Negative for dizziness and headaches.  Psychiatric/Behavioral: Positive for dysphoric mood and sleep disturbance. Negative for suicidal ideas. The patient is nervous/anxious.     Per HPI unless specifically indicated above     Objective:    BP 138/80   Pulse 91   Temp 98.8 F (37.1 C) (Oral)   Wt 214 lb 6.4 oz (97.3 kg)   LMP 01/21/2021 (Approximate)   SpO2 98%   BMI 34.61 kg/m   Wt Readings from Last 3 Encounters:  03/03/21 214 lb 6.4 oz (97.3 kg)  01/31/21 220 lb (99.8 kg)  09/09/20 228 lb 8 oz (103.6 kg)    Physical Exam Vitals and nursing note reviewed.  Constitutional:      General: She is not in acute distress.    Appearance: Normal appearance. She is normal weight. She is not ill-appearing, toxic-appearing or diaphoretic.  HENT:     Head: Normocephalic.     Right Ear: External ear normal.     Left Ear: External ear normal.     Nose: Nose normal.     Mouth/Throat:     Mouth: Mucous membranes are moist.     Pharynx: Oropharynx is clear.  Eyes:  General:        Right eye: No discharge.        Left eye: No discharge.     Extraocular Movements: Extraocular movements intact.     Conjunctiva/sclera: Conjunctivae normal.     Pupils: Pupils are equal, round, and reactive to light.  Cardiovascular:     Rate and Rhythm: Normal rate and regular rhythm.     Heart sounds: No murmur heard.   Pulmonary:     Effort: Pulmonary effort is normal. No respiratory distress.     Breath sounds: Normal breath sounds. No wheezing or rales.  Musculoskeletal:     Cervical back: Normal range of motion and neck supple.  Skin:    General: Skin is warm and dry.     Capillary Refill: Capillary refill takes less than 2 seconds.  Neurological:     General: No focal deficit present.     Mental Status: She is alert and oriented to person, place, and time. Mental status is at baseline.   Psychiatric:        Mood and Affect: Mood normal.        Behavior: Behavior normal.        Thought Content: Thought content normal.        Judgment: Judgment normal.     Results for orders placed or performed in visit on 01/31/21  Comp Met (CMET)  Result Value Ref Range   Glucose 113 (H) 65 - 99 mg/dL   BUN 8 6 - 24 mg/dL   Creatinine, Ser 0.79 0.57 - 1.00 mg/dL   eGFR 92 >59 mL/min/1.73   BUN/Creatinine Ratio 10 9 - 23   Sodium 138 134 - 144 mmol/L   Potassium 4.4 3.5 - 5.2 mmol/L   Chloride 99 96 - 106 mmol/L   CO2 17 (L) 20 - 29 mmol/L   Calcium 9.5 8.7 - 10.2 mg/dL   Total Protein 7.1 6.0 - 8.5 g/dL   Albumin 4.3 3.8 - 4.8 g/dL   Globulin, Total 2.8 1.5 - 4.5 g/dL   Albumin/Globulin Ratio 1.5 1.2 - 2.2   Bilirubin Total 0.5 0.0 - 1.2 mg/dL   Alkaline Phosphatase 87 44 - 121 IU/L   AST 17 0 - 40 IU/L   ALT 16 0 - 32 IU/L  Lipid Profile  Result Value Ref Range   Cholesterol, Total 121 100 - 199 mg/dL   Triglycerides 104 0 - 149 mg/dL   HDL 43 >39 mg/dL   VLDL Cholesterol Cal 19 5 - 40 mg/dL   LDL Chol Calc (NIH) 59 0 - 99 mg/dL   Chol/HDL Ratio 2.8 0.0 - 4.4 ratio  HgB A1c  Result Value Ref Range   Hgb A1c MFr Bld 5.4 4.8 - 5.6 %   Est. average glucose Bld gHb Est-mCnc 108 mg/dL      Assessment & Plan:   Problem List Items Addressed This Visit      Other   Anxiety and depression    Chronic.  Ongoing.  Will increases Effexor to $RemoveBe'75mg'hlRUoWpIk$  daily. Denies SI. Follow up in 2 months.       Relevant Medications   traZODone (DESYREL) 100 MG tablet   venlafaxine XR (EFFEXOR XR) 75 MG 24 hr capsule   Smoking - Primary    Other Visit Diagnoses    Encounter for smoking cessation counseling       Increased patch to $Remove'21mg'QHxvnTT$ . Recommend picking a day next week to quit smoking.  Return in 2 months for  reevaluation.   Need for pneumococcal vaccination       Relevant Orders   Pneumococcal polysaccharide vaccine 23-valent greater than or equal to 2yo subcutaneous/IM (Completed)    Dental infection       Repeat dose of augmentin which helped but the infection is back.  Patient does not have the money to see a dentist. Will reach out to [employer], [employer].   Relevant Medications   amoxicillin-clavulanate (AUGMENTIN) 875-125 MG tablet       Follow up plan: Return in about 2 months (around 05/03/2021) for Depression/Anxiety FU, smoking cessation.

## 2021-03-03 ENCOUNTER — Other Ambulatory Visit: Payer: Self-pay

## 2021-03-03 ENCOUNTER — Encounter: Payer: Self-pay | Admitting: Nurse Practitioner

## 2021-03-03 ENCOUNTER — Ambulatory Visit (INDEPENDENT_AMBULATORY_CARE_PROVIDER_SITE_OTHER): Admitting: Nurse Practitioner

## 2021-03-03 VITALS — BP 138/80 | HR 91 | Temp 98.8°F | Wt 214.4 lb

## 2021-03-03 DIAGNOSIS — Z23 Encounter for immunization: Secondary | ICD-10-CM | POA: Diagnosis not present

## 2021-03-03 DIAGNOSIS — Z716 Tobacco abuse counseling: Secondary | ICD-10-CM | POA: Diagnosis not present

## 2021-03-03 DIAGNOSIS — F419 Anxiety disorder, unspecified: Secondary | ICD-10-CM

## 2021-03-03 DIAGNOSIS — F32A Depression, unspecified: Secondary | ICD-10-CM

## 2021-03-03 DIAGNOSIS — F17200 Nicotine dependence, unspecified, uncomplicated: Secondary | ICD-10-CM | POA: Diagnosis not present

## 2021-03-03 DIAGNOSIS — K047 Periapical abscess without sinus: Secondary | ICD-10-CM

## 2021-03-03 MED ORDER — AMOXICILLIN-POT CLAVULANATE 875-125 MG PO TABS: 1.0000 | ORAL_TABLET | Freq: Two times a day (BID) | ORAL | 0 refills | Status: AC

## 2021-03-03 MED ORDER — TRAZODONE HCL 100 MG PO TABS: 200.0000 mg | ORAL_TABLET | Freq: Every evening | ORAL | 1 refills | Status: DC | PRN

## 2021-03-03 MED ORDER — VENLAFAXINE HCL ER 75 MG PO CP24: 75.0000 mg | ORAL_CAPSULE | Freq: Every day | ORAL | 1 refills | Status: DC

## 2021-03-03 MED ORDER — NICOTINE 21 MG/24HR TD PT24: 21.0000 mg | MEDICATED_PATCH | Freq: Every day | TRANSDERMAL | 0 refills | Status: DC

## 2021-03-03 NOTE — Assessment & Plan Note (Signed)
Chronic.  Ongoing.  Will increases Effexor to 75mg  daily. Denies SI. Follow up in 2 months.

## 2021-03-03 NOTE — Patient Instructions (Signed)
Pneumococcal Polysaccharide Vaccine (PPSV23): What You Need to Know 1. Why get vaccinated? Pneumococcal polysaccharide vaccine (PPSV23) can prevent pneumococcal disease. Pneumococcal disease refers to any illness caused by pneumococcal bacteria. These bacteria can cause many types of illnesses, including pneumonia, which is an infection of the lungs. Pneumococcal bacteria are one of the most common causes of pneumonia. Besides pneumonia, pneumococcal bacteria can also cause:  Ear infections  Sinus infections  Meningitis (infection of the tissue covering the brain and spinal cord)  Bacteremia (bloodstream infection) Anyone can get pneumococcal disease, but children under 2 years of age, people with certain medical conditions, adults 65 years or older, and cigarette smokers are at the highest risk. Most pneumococcal infections are mild. However, some can result in long-term problems, such as brain damage or hearing loss. Meningitis, bacteremia, and pneumonia caused by pneumococcal disease can be fatal. 2. PPSV23 PPSV23 protects against 23 types of bacteria that cause pneumococcal disease. PPSV23 is recommended for:  All adults 65 years or older,  Anyone 2 years or older with certain medical conditions that can lead to an increased risk for pneumococcal disease. Most people need only one dose of PPSV23. A second dose of PPSV23, and another type of pneumococcal vaccine called PCV13, are recommended for certain high-risk groups. Your health care provider can give you more information. People 65 years or older should get a dose of PPSV23 even if they have already gotten one or more doses of the vaccine before they turned 65. 3. Talk with your health care provider Tell your vaccine provider if the person getting the vaccine:  Has had an allergic reaction after a previous dose of PPSV23, or has any severe, life-threatening allergies. In some cases, your health care provider may decide to postpone  PPSV23 vaccination to a future visit. People with minor illnesses, such as a cold, may be vaccinated. People who are moderately or severely ill should usually wait until they recover before getting PPSV23. Your health care provider can give you more information. 4. Risks of a vaccine reaction  Redness or pain where the shot is given, feeling tired, fever, or muscle aches can happen after PPSV23. People sometimes faint after medical procedures, including vaccination. Tell your provider if you feel dizzy or have vision changes or ringing in the ears. As with any medicine, there is a very remote chance of a vaccine causing a severe allergic reaction, other serious injury, or death. 5. What if there is a serious problem? An allergic reaction could occur after the vaccinated person leaves the clinic. If you see signs of a severe allergic reaction (hives, swelling of the face and throat, difficulty breathing, a fast heartbeat, dizziness, or weakness), call 9-1-1 and get the person to the nearest hospital. For other signs that concern you, call your health care provider. Adverse reactions should be reported to the Vaccine Adverse Event Reporting System (VAERS). Your health care provider will usually file this report, or you can do it yourself. Visit the VAERS website at www.vaers.hhs.gov or call 1-800-822-7967. VAERS is only for reporting reactions, and VAERS staff do not give medical advice. 6. How can I learn more?  Ask your health care provider.  Call your local or state health department.  Contact the Centers for Disease Control and Prevention (CDC): ? Call 1-800-232-4636 (1-800-CDC-INFO) or ? Visit CDC's website at www.cdc.gov/vaccines Vaccine Information Statement PPSV23 Vaccine (08/07/2018) This information is not intended to replace advice given to you by your health care provider. Make sure you discuss   any questions you have with your health care provider. Document Revised: 05/28/2020  Document Reviewed: 05/28/2020 Elsevier Patient Education  2021 Elsevier Inc.   

## 2021-03-18 ENCOUNTER — Other Ambulatory Visit: Payer: Self-pay

## 2021-03-18 ENCOUNTER — Emergency Department
Admission: EM | Admit: 2021-03-18 | Discharge: 2021-03-18 | Disposition: A | Discharge status: Home / Self Care | Attending: Emergency Medicine | Admitting: Emergency Medicine

## 2021-03-18 ENCOUNTER — Emergency Department

## 2021-03-18 DIAGNOSIS — Z7902 Long term (current) use of antithrombotics/antiplatelets: Secondary | ICD-10-CM | POA: Insufficient documentation

## 2021-03-18 DIAGNOSIS — H538 Other visual disturbances: Secondary | ICD-10-CM | POA: Insufficient documentation

## 2021-03-18 DIAGNOSIS — R519 Headache, unspecified: Secondary | ICD-10-CM | POA: Diagnosis not present

## 2021-03-18 DIAGNOSIS — F17210 Nicotine dependence, cigarettes, uncomplicated: Secondary | ICD-10-CM | POA: Insufficient documentation

## 2021-03-18 DIAGNOSIS — I10 Essential (primary) hypertension: Secondary | ICD-10-CM | POA: Diagnosis not present

## 2021-03-18 DIAGNOSIS — Z79899 Other long term (current) drug therapy: Secondary | ICD-10-CM | POA: Diagnosis not present

## 2021-03-18 LAB — DIFFERENTIAL
Abs Immature Granulocytes: 0.02 10*3/uL (ref 0.00–0.07)
Basophils Absolute: 0 10*3/uL (ref 0.0–0.1)
Basophils Relative: 0 %
Eosinophils Absolute: 0.1 10*3/uL (ref 0.0–0.5)
Eosinophils Relative: 1 %
Immature Granulocytes: 0 %
Lymphocytes Relative: 16 %
Lymphs Abs: 1.2 10*3/uL (ref 0.7–4.0)
Monocytes Absolute: 0.4 10*3/uL (ref 0.1–1.0)
Monocytes Relative: 6 %
Neutro Abs: 5.6 10*3/uL (ref 1.7–7.7)
Neutrophils Relative %: 77 %

## 2021-03-18 LAB — PROTIME-INR
INR: 1.1 (ref 0.8–1.2)
Prothrombin Time: 14.4 seconds (ref 11.4–15.2)

## 2021-03-18 LAB — COMPREHENSIVE METABOLIC PANEL
ALT: 36 U/L (ref 0–44)
AST: 33 U/L (ref 15–41)
Albumin: 3.7 g/dL (ref 3.5–5.0)
Alkaline Phosphatase: 53 U/L (ref 38–126)
Anion gap: 6 (ref 5–15)
BUN: 14 mg/dL (ref 6–20)
CO2: 29 mmol/L (ref 22–32)
Calcium: 8.8 mg/dL — ABNORMAL LOW (ref 8.9–10.3)
Chloride: 103 mmol/L (ref 98–111)
Creatinine, Ser: 0.84 mg/dL (ref 0.44–1.00)
GFR, Estimated: >60 mL/min (ref >60)
Glucose, Bld: 116 mg/dL — ABNORMAL HIGH (ref 70–99)
Potassium: 3.6 mmol/L (ref 3.5–5.1)
Sodium: 138 mmol/L (ref 135–145)
Total Bilirubin: 0.7 mg/dL (ref 0.3–1.2)
Total Protein: 7.1 g/dL (ref 6.5–8.1)

## 2021-03-18 LAB — CBC
HCT: 40.8 % (ref 36.0–46.0)
Hemoglobin: 13.6 g/dL (ref 12.0–15.0)
MCH: 30.1 pg (ref 26.0–34.0)
MCHC: 33.3 g/dL (ref 30.0–36.0)
MCV: 90.3 fL (ref 80.0–100.0)
Platelets: 272 10*3/uL (ref 150–400)
RBC: 4.52 MIL/uL (ref 3.87–5.11)
RDW: 14.1 % (ref 11.5–15.5)
WBC: 7.3 10*3/uL (ref 4.0–10.5)
nRBC: 0 % (ref 0.0–0.2)

## 2021-03-18 LAB — APTT: aPTT: 29 seconds (ref 24–36)

## 2021-03-18 MED ORDER — SODIUM CHLORIDE 0.9% FLUSH
3.0000 mL | Freq: Once | INTRAVENOUS | Status: DC
Start: 2021-03-18 — End: 2021-03-18

## 2021-03-18 MED ORDER — BUTALBITAL-APAP-CAFFEINE 50-325-40 MG PO TABS: 1.0000 | ORAL_TABLET | Freq: Four times a day (QID) | ORAL | 0 refills | Status: DC | PRN

## 2021-03-18 NOTE — ED Triage Notes (Signed)
Pt comes with c/o blurry vision and dizziness for two days. Pt states hx of CVA in past.   Pt states blurry vision is worse in am. Pt denies any current blurry vision or dizziness at this time.   Mini neuro neg.  Pt states generalized aches and headache

## 2021-03-18 NOTE — ED Notes (Signed)
Follow up neuro info provided all questions answered

## 2021-03-18 NOTE — ED Provider Notes (Signed)
[facility] Emergency Department Provider Note  Time seen: 6:49 PM  I have reviewed the triage vital signs and the nursing notes.   HISTORY  Chief Complaint Blurred Vision   HPI Latasha Lawrence is a 50 y.o. female with a past medical history of anxiety, fibromyalgia, gastric reflux, hypertension, CVA, presents to the emergency department for headache.  According to the patient she has a history of CVA with right-sided deficits.  States she has chronic headaches but feels like her headaches have worsened over the past few days.  She states her family member noted that she seemed to be more forgetful over the last several days as well so she came to the emergency department for evaluation.  Patient denies any new weakness or numbness confusion or changes in speech.   Past Medical History:  Diagnosis Date   Abnormal weight gain    Anxiety    Chronic back pain    Controlled substance agreement terminated 07/2014   failed UDS at [facility]   Depression    Fibromyalgia    GERD (gastroesophageal reflux disease)    Headache    Hirsutism    Hypertension    Hypokalemia    Neutrophilic leukocytosis    Periodontal disease    Stomach ulcer    Stroke (HCC)    Vitamin D deficiency disease     Patient Active Problem List   Diagnosis Date Noted   Hyperlipidemia 12/30/2020   Gastroesophageal reflux disease 09/09/2020   Other constipation 09/09/2020   Panic disorder 07/15/2020   Smoking 07/15/2020   History of stroke 05/26/2020   IFG (impaired fasting glucose) 04/20/2020   HSV (herpes simplex virus) infection 04/20/2020   Anxiety and depression 02/25/2020   Allergic rhinitis 02/25/2020   Internal carotid artery stenosis 04/26/2017   Carotid stenosis, symptomatic, with infarction (HCC) 04/25/2017   Cerebral embolus 04/25/2017   Acute ischemic stroke (HCC) - Stroke: acute and sub-acute scattered cortical infarcts involving the left MCA, very likely  embolic in the setting of   ICA stenosis. 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   Opioid type dependence, abuse (HCC) 06/13/2011    Past Surgical History:  Procedure Laterality Date   Arm Surgery     CESAREAN SECTION     IR ANGIO INTRA EXTRACRAN SEL COM CAROTID INNOMINATE BILAT MOD SED  04/19/2017   IR ANGIO VERTEBRAL SEL VERTEBRAL BILAT MOD SED  04/19/2017   IR INTRAVSC STENT CERV CAROTID W/O EMB-PROT MOD SED INC ANGIO  04/25/2017   RADIOLOGY WITH ANESTHESIA Left 04/25/2017   Procedure: RADIOLOGY WITH ANESTHESIA LEFT CARDIAC  STENT;  Surgeon: Julieanne Cotton, MD;  Location: [facility] OR;  Service: Radiology;  Laterality: Left;    Prior to Admission medications   Medication Sig Start Date End Date Taking? Authorizing Provider  butalbital-acetaminophen-caffeine (FIORICET) 50-325-40 MG tablet Take 1-2 tablets by mouth every 6 (six) hours as needed for headache. 03/18/21 03/18/22 Yes Minna Antis, MD  ALPRAZolam Prudy Feeler) 0.5 MG tablet Take 1 tablet (0.5 mg total) by mouth at bedtime as needed for anxiety.  Patient not taking: Reported on 03/03/2021 10/29/20   Olevia Perches P, DO  atorvastatin (LIPITOR) 80 MG tablet Take 1 tablet (80 mg total) by mouth daily at 6 PM. 01/31/21   Larae Grooms, NP  Buprenorphine HCl-Naloxone HCl 8-2 MG FILM Place under the tongue daily. 02/24/21   [provider]  clopidogrel (PLAVIX) 75 MG tablet Take 1 tablet (75 mg total) by mouth daily. 01/31/21   Larae Grooms, NP  fluticasone (FLONASE) 50 MCG/ACT nasal spray Place 1 spray into both nostrils in the morning and at bedtime. 10/29/20    Johnson, Megan P, DO  hydrochlorothiazide (HYDRODIURIL) 25 MG tablet Take 1 tablet (25 mg total) by mouth daily. 01/31/21   Larae Grooms, NP  hydrOXYzine (VISTARIL) 100 MG capsule Take 1 capsule (100 mg total) by mouth 3 (three) times daily as needed. 01/31/21   Larae Grooms, NP  losartan (COZAAR) 50 MG tablet Take 1 tablet (50 mg total) by mouth daily. 01/31/21   Larae Grooms, NP  nicotine (NICODERM CQ) 21 mg/24hr patch Place 1 patch (21 mg total) onto the skin daily. 03/03/21   Larae Grooms, NP  pantoprazole (PROTONIX) 40 MG tablet Take 1 tablet (40 mg total) by mouth daily. 01/31/21   Larae Grooms, NP  spironolactone (ALDACTONE) 25 MG tablet Take 1 tablet (25 mg total) by mouth daily. 01/31/21   Larae Grooms, NP  traZODone (DESYREL) 100 MG tablet Take 2 tablets (200 mg total) by mouth at bedtime as needed for sleep. 03/03/21   Larae Grooms, NP  venlafaxine XR (EFFEXOR XR) 75 MG 24 hr capsule Take 1 capsule (75 mg total) by mouth daily with breakfast. 03/03/21   Larae Grooms, NP    Allergies  Allergen Reactions   Nsaids Other (See Comments)    Causes ulcers to bleed   Morphine And Related Itching    Family History  Problem Relation Age of Onset   Arthritis Mother    Cancer Mother        breast   Hyperlipidemia Father    Hypertension Father    Stroke Maternal Grandmother     Social History Social History   Tobacco Use   Smoking status: Every Day    Packs/day: 0.50    Years: 40.00    Pack years: 20.00    Types: Cigarettes   Smokeless tobacco: Never  Vaping Use   Vaping Use: Former  Substance Use Topics   Alcohol use: No   Drug use: No    Review of Systems Constitutional: Negative for fever. Cardiovascular: Negative for chest pain. Respiratory: Negative for shortness of breath. Gastrointestinal: Negative for abdominal pain, vomiting Musculoskeletal: Negative for musculoskeletal complaints Skin: Negative for skin complaints   Neurological: Moderate intermittent headaches All other ROS negative  ____________________________________________   PHYSICAL EXAM:  VITAL SIGNS: ED Triage Vitals  Enc Vitals Group     BP 03/18/21 1513 122/79     Pulse Rate 03/18/21 1513 70     Resp 03/18/21 1513 18     Temp 03/18/21 1513 98.5 F (36.9 C)     Temp Source 03/18/21 1513 Oral     SpO2 03/18/21 1513 93 %     Weight 03/18/21 1511 220 lb (99.8 kg)     Height 03/18/21 1511 5\' 6"  (1.676 m)     Head Circumference --      Peak Flow --      Pain Score 03/18/21 1511 7     Pain Loc --  Pain Edu? --      Excl. in GC? --    Constitutional: Alert and oriented. Well appearing and in no distress. Eyes: Normal exam ENT      Head: Normocephalic and atraumatic.      Mouth/Throat: Mucous membranes are moist. Cardiovascular: Normal rate, regular rhythm.  Respiratory: Normal respiratory effort without tachypnea nor retractions. Breath sounds are clear  Gastrointestinal: Soft and nontender. No distention. Musculoskeletal: Nontender with normal range of motion in all extremities. Neurologic:  Normal speech and language. No gross focal neurologic deficits  Skin:  Skin is warm, dry and intact.  Psychiatric: Mood and affect are normal.   ____________________________________________    EKG  EKG viewed and interpreted by myself shows a normal sinus rhythm at 73 bpm with a narrow QRS, normal axis, normal intervals, no concerning ST changes.  ____________________________________________    RADIOLOGY  CT of the head is negative for acute abnormality but does show an old left MCA stroke.  ____________________________________________   INITIAL IMPRESSION / ASSESSMENT AND PLAN / ED COURSE  Pertinent labs & imaging results that were available during my care of the patient were reviewed by me and considered in my medical decision making (see chart for details).   Patient presents emergency department for intermittent  headaches.  Patient states a history of chronic headaches but feels like they are getting worse over the past few days also a family member noted that she seemed to be more forgetful than usual.  Here the patient appears well, is awake alert oriented.  Has right upper extremity deficits but denies any new deficits.  Reassuring physical exam.  Lab work is reassuring.  CT scan head is negative.  Vitals reassuring.  Given the patient's chronic headaches I did discuss a trial of Fioricet and follow-up with her neurologist.  Patient agreeable to plan of care.  Latasha Lawrence was evaluated in Emergency Department on 03/18/2021 for the symptoms described in the history of present illness. She was evaluated in the context of the global COVID-19 pandemic, which necessitated consideration that the patient might be at risk for infection with the SARS-CoV-2 virus that causes COVID-19. Institutional protocols and algorithms that pertain to the evaluation of patients at risk for COVID-19 are in a state of rapid change based on information released by regulatory bodies including the CDC and federal and state organizations. These policies and algorithms were followed during the patient's care in the ED.  ____________________________________________   FINAL CLINICAL IMPRESSION(S) / ED DIAGNOSES  Headaches   Minna Antis, MD 03/18/21 1921

## 2021-03-23 ENCOUNTER — Ambulatory Visit: Payer: Self-pay | Admitting: *Deleted

## 2021-03-23 NOTE — Telephone Encounter (Signed)
Reason for Disposition  [1] Vomiting AND [2] contains bile (green color)  Answer Assessment - Initial Assessment Questions 1. VOMITING SEVERITY: "How many times have you vomited in the past 24 hours?"     - MILD:  1 - 2 times/day    - MODERATE: 3 - 5 times/day, decreased oral intake without significant weight loss or symptoms of dehydration    - SEVERE: 6 or more times/day, vomits everything or nearly everything, with significant weight loss, symptoms of dehydration      8 times 2. ONSET: "When did the vomiting begin?"      3 days ago 3. FLUIDS: "What fluids or food have you vomited up today?" "Have you been able to keep any fluids down?"     "Little bit of water." 4. ABDOMINAL PAIN: "Are your having any abdominal pain?" If yes : "How bad is it and what does it feel like?" (e.g., crampy, dull, intermittent, constant)     Mild, cramping 5. DIARRHEA: "Is there any diarrhea?" If Yes, ask: "How many times today?"      2-3 times daily 6. CONTACTS: "Is there anyone else in the family with the same symptoms?"      no 7. CAUSE: "What do you think is causing your vomiting?"     Unsure 8. HYDRATION STATUS: "Any signs of dehydration?" (e.g., dry mouth [not only dry lips], too weak to stand) "When did you last urinate?"     Dry mouth, Little dark" 9. OTHER SYMPTOMS: "Do you have any other symptoms?" (e.g., fever, headache, vertigo, vomiting blood or coffee grounds, recent head injury)     Nausea  Protocols used: Vomiting-A-AH

## 2021-03-23 NOTE — Telephone Encounter (Signed)
Patient states she was able to keep down some Gatorade, a banana, and a little bit of water so she does not want to go to ED.

## 2021-03-23 NOTE — Telephone Encounter (Signed)
Please advise 

## 2021-03-23 NOTE — Telephone Encounter (Signed)
Patient should be seen in the ER due to risk of dehydration.

## 2021-03-23 NOTE — Telephone Encounter (Signed)
Pt reports nausea, vomiting and diarrhea, third day. States vomiting 8 times daily, "Mostly bile." Diarrhea 2-3 times daily, "Watery, yellow." Mild abdominal cramping. Denies fever, no dizziness. Reports urine "A little darker than usual, mouth getting dry." States is able to keep down "Some" liquids. Calling to request something be called in for nausea, symptoms. Advised UC/ED,declines. Call during lunch hour.  Please advise: [phone]

## 2021-03-24 ENCOUNTER — Encounter (INDEPENDENT_AMBULATORY_CARE_PROVIDER_SITE_OTHER)

## 2021-03-24 ENCOUNTER — Encounter (INDEPENDENT_AMBULATORY_CARE_PROVIDER_SITE_OTHER): Admitting: Vascular Surgery

## 2021-04-14 ENCOUNTER — Ambulatory Visit: Admitting: Nurse Practitioner

## 2021-04-14 ENCOUNTER — Ambulatory Visit (INDEPENDENT_AMBULATORY_CARE_PROVIDER_SITE_OTHER): Admitting: Nurse Practitioner

## 2021-04-14 ENCOUNTER — Encounter: Payer: Self-pay | Admitting: Nurse Practitioner

## 2021-04-14 ENCOUNTER — Other Ambulatory Visit: Payer: Self-pay

## 2021-04-14 VITALS — BP 141/81 | HR 77 | Temp 98.4°F | Wt 216.0 lb

## 2021-04-14 DIAGNOSIS — M2550 Pain in unspecified joint: Secondary | ICD-10-CM | POA: Diagnosis not present

## 2021-04-14 DIAGNOSIS — R609 Edema, unspecified: Secondary | ICD-10-CM | POA: Diagnosis not present

## 2021-04-14 DIAGNOSIS — K047 Periapical abscess without sinus: Secondary | ICD-10-CM | POA: Insufficient documentation

## 2021-04-14 MED ORDER — AMOXICILLIN-POT CLAVULANATE 875-125 MG PO TABS: 1.0000 | ORAL_TABLET | Freq: Two times a day (BID) | ORAL | 0 refills | Status: AC

## 2021-04-14 NOTE — Progress Notes (Signed)
BP (!) 141/81   Pulse 77   Temp 98.4 F (36.9 C)   Wt 216 lb (98 kg)   SpO2 93%   BMI 34.86 kg/m    Subjective:    Patient ID: Latasha Lawrence, female    DOB: Feb 07, 1971, 50 y.o.   MRN: [id number]  HPI: Latasha Lawrence is a 50 y.o. female  Chief Complaint  Patient presents with   Dental Pain   Medication Refill    Xanax   Patient presents to clinic with complaints of a dental infection that keeps reoccurring.  She does not have money to see a dentist. Patient states that it is painful.  It started about 3 days ago.  Denies fever or discharge from the tooth.   Patient states she has having a lot of joint pain and swelling.  She is having it in her ankles, knees and hands.  No redness but the pain is getting worse and making it difficult for her to move.    Relevant past medical, surgical, family and social history reviewed and updated as indicated. Interim medical history since our last visit reviewed. Allergies and medications reviewed and updated.  Review of Systems  HENT:  Positive for dental problem.   Musculoskeletal:  Positive for arthralgias and joint swelling.   Per HPI unless specifically indicated above     Objective:    BP (!) 141/81   Pulse 77   Temp 98.4 F (36.9 C)   Wt 216 lb (98 kg)   SpO2 93%   BMI 34.86 kg/m   Wt Readings from Last 3 Encounters:  04/14/21 216 lb (98 kg)  03/18/21 220 lb (99.8 kg)  03/03/21 214 lb 6.4 oz (97.3 kg)    Physical Exam Vitals and nursing note reviewed.  Constitutional:      General: She is not in acute distress.    Appearance: Normal appearance. She is normal weight. She is not ill-appearing, toxic-appearing or diaphoretic.  HENT:     Head: Normocephalic.     Right Ear: External ear normal.     Left Ear: External ear normal.     Nose: Nose normal.     Mouth/Throat:     Mouth: Mucous membranes are moist.     Pharynx: Oropharynx is clear.  Eyes:     General:        Right eye: No discharge.        Left eye:  No discharge.     Extraocular Movements: Extraocular movements intact.     Conjunctiva/sclera: Conjunctivae normal.     Pupils: Pupils are equal, round, and reactive to light.  Cardiovascular:     Rate and Rhythm: Normal rate and regular rhythm.     Heart sounds: No murmur heard. Pulmonary:     Effort: Pulmonary effort is normal. No respiratory distress.     Breath sounds: Normal breath sounds. No wheezing or rales.  Musculoskeletal:        General: Swelling (bilateral ankles) present.     Cervical back: Normal range of motion and neck supple.  Skin:    General: Skin is warm and dry.     Capillary Refill: Capillary refill takes less than 2 seconds.  Neurological:     General: No focal deficit present.     Mental Status: She is alert and oriented to person, place, and time. Mental status is at baseline.  Psychiatric:        Mood and Affect: Mood normal.  Behavior: Behavior normal.        Thought Content: Thought content normal.        Judgment: Judgment normal.    Results for orders placed or performed during the hospital encounter of 03/18/21  Protime-INR  Result Value Ref Range   Prothrombin Time 14.4 11.4 - 15.2 seconds   INR 1.1 0.8 - 1.2  APTT  Result Value Ref Range   aPTT 29 24 - 36 seconds  CBC  Result Value Ref Range   WBC 7.3 4.0 - 10.5 K/uL   RBC 4.52 3.87 - 5.11 MIL/uL   Hemoglobin 13.6 12.0 - 15.0 g/dL   HCT 40.8 36.0 - 46.0 %   MCV 90.3 80.0 - 100.0 fL   MCH 30.1 26.0 - 34.0 pg   MCHC 33.3 30.0 - 36.0 g/dL   RDW 14.1 11.5 - 15.5 %   Platelets 272 150 - 400 K/uL   nRBC 0.0 0.0 - 0.2 %  Differential  Result Value Ref Range   Neutrophils Relative % 77 %   Neutro Abs 5.6 1.7 - 7.7 K/uL   Lymphocytes Relative 16 %   Lymphs Abs 1.2 0.7 - 4.0 K/uL   Monocytes Relative 6 %   Monocytes Absolute 0.4 0.1 - 1.0 K/uL   Eosinophils Relative 1 %   Eosinophils Absolute 0.1 0.0 - 0.5 K/uL   Basophils Relative 0 %   Basophils Absolute 0.0 0.0 - 0.1 K/uL    Immature Granulocytes 0 %   Abs Immature Granulocytes 0.02 0.00 - 0.07 K/uL  Comprehensive metabolic panel  Result Value Ref Range   Sodium 138 135 - 145 mmol/L   Potassium 3.6 3.5 - 5.1 mmol/L   Chloride 103 98 - 111 mmol/L   CO2 29 22 - 32 mmol/L   Glucose, Bld 116 (H) 70 - 99 mg/dL   BUN 14 6 - 20 mg/dL   Creatinine, Ser 0.84 0.44 - 1.00 mg/dL   Calcium 8.8 (L) 8.9 - 10.3 mg/dL   Total Protein 7.1 6.5 - 8.1 g/dL   Albumin 3.7 3.5 - 5.0 g/dL   AST 33 15 - 41 U/L   ALT 36 0 - 44 U/L   Alkaline Phosphatase 53 38 - 126 U/L   Total Bilirubin 0.7 0.3 - 1.2 mg/dL   GFR, Estimated >60 >60 mL/min   Anion gap 6 5 - 15      Assessment & Plan:   Problem List Items Addressed This Visit       Digestive   Dental infection    Treated with Augumentin. Patient given information for [facility] to see the dentist.  Discussed with patient the importance of seeing a dentist to have the tooth removed. Discussed the risks associated with continued antibiotic use. Follow up if symptoms worsen or fail to improve.       Other Visit Diagnoses     Arthralgia, unspecified joint    -  Primary   Lab work ordered today.  Will make recommendations based on lab work.    Relevant Orders   Comp Met (CMET)   CBC w/Diff   Lyme disease, western blot   Rocky mtn spotted fvr abs pnl(IgG+IgM)   Antinuclear Antib (ANA)   TSH   Swelling       Relevant Orders   Comp Met (CMET)   CBC w/Diff   Lyme disease, western blot   Rocky mtn spotted fvr abs pnl(IgG+IgM)   Antinuclear Antib (ANA)   TSH  Follow up plan: Return if symptoms worsen or fail to improve.

## 2021-04-14 NOTE — Assessment & Plan Note (Signed)
Treated with Augumentin. Patient given information for [facility] to see the dentist.  Discussed with patient the importance of seeing a dentist to have the tooth removed. Discussed the risks associated with continued antibiotic use. Follow up if symptoms worsen or fail to improve.

## 2021-04-18 NOTE — Progress Notes (Signed)
Please let patient know that so far, lab work looks good.  She does not have an autoimmune disorder that is causing the joint pain, her complete blood count, liver, kidneys, and electrolytes look good.  I am still waiting for her tick born illness labs to return which can cause joint pain.  They are taking several days.  Once they are back we will let her know.

## 2021-04-26 MED ORDER — DOXYCYCLINE HYCLATE 100 MG PO TABS: 100.0000 mg | ORAL_TABLET | Freq: Two times a day (BID) | ORAL | 0 refills | Status: AC

## 2021-04-26 MED ORDER — ONDANSETRON 4 MG PO TBDP: 4.0000 mg | ORAL_TABLET | Freq: Three times a day (TID) | ORAL | 0 refills | Status: DC | PRN

## 2021-04-26 NOTE — Addendum Note (Signed)
Addended by: Larae Grooms on: 04/26/2021 10:41 AM   Modules accepted: Orders

## 2021-04-26 NOTE — Progress Notes (Signed)
Zofran sent to the pharmacy

## 2021-04-26 NOTE — Addendum Note (Signed)
Addended by: Larae Grooms on: 04/26/2021 08:25 AM   Modules accepted: Orders

## 2021-04-26 NOTE — Progress Notes (Signed)
Please let patient know that she is positive for rocky mountain spotted fever which can cause fatigue and joint aches.  This is likely the cause of her symptoms.  She will need to complete a 3 week course of doxycyline to treat this.  She should stay out of the sun as much as possible while taking the medication.  Also, recommend she take it with food as it can cause some GI upset.  I have sent this to the pharmacy already.

## 2021-04-29 ENCOUNTER — Telehealth: Payer: Self-pay | Admitting: Nurse Practitioner

## 2021-04-29 NOTE — Telephone Encounter (Signed)
Pt had spoke to Latasha Lawrence about a referral to psychiatry pt is calling to check on the status of this. CB- [phone]

## 2021-04-29 NOTE — Telephone Encounter (Signed)
Patient is calling to check on referral: psychiatry. Referral in 6 months ago- 10/29/20 from Dr Laural Benes. Patient states she was to have referral from last appointment.

## 2021-04-29 NOTE — Telephone Encounter (Signed)
Please look into this

## 2021-04-30 LAB — CBC WITH DIFFERENTIAL/PLATELET
Basophils Absolute: 0 10*3/uL (ref 0.0–0.2)
Basos: 0 %
EOS (ABSOLUTE): 0.2 10*3/uL (ref 0.0–0.4)
Eos: 2 %
Hematocrit: 42.6 % (ref 34.0–46.6)
Hemoglobin: 13.2 g/dL (ref 11.1–15.9)
Immature Grans (Abs): 0 10*3/uL (ref 0.0–0.1)
Immature Granulocytes: 0 %
Lymphocytes Absolute: 1.3 10*3/uL (ref 0.7–3.1)
Lymphs: 19 %
MCH: 28.1 pg (ref 26.6–33.0)
MCHC: 31 g/dL — ABNORMAL LOW (ref 31.5–35.7)
MCV: 91 fL (ref 79–97)
Monocytes Absolute: 0.5 10*3/uL (ref 0.1–0.9)
Monocytes: 7 %
Neutrophils Absolute: 4.6 10*3/uL (ref 1.4–7.0)
Neutrophils: 72 %
Platelets: 282 10*3/uL (ref 150–450)
RBC: 4.69 x10E6/uL (ref 3.77–5.28)
RDW: 13.1 % (ref 11.7–15.4)
WBC: 6.5 10*3/uL (ref 3.4–10.8)

## 2021-04-30 LAB — ANA: Anti Nuclear Antibody (ANA): NEGATIVE

## 2021-04-30 LAB — COMPREHENSIVE METABOLIC PANEL
ALT: 15 IU/L (ref 0–32)
AST: 15 IU/L (ref 0–40)
Albumin/Globulin Ratio: 1.2 (ref 1.2–2.2)
Albumin: 3.6 g/dL — ABNORMAL LOW (ref 3.8–4.8)
Alkaline Phosphatase: 92 IU/L (ref 44–121)
BUN/Creatinine Ratio: 11 (ref 9–23)
BUN: 10 mg/dL (ref 6–24)
Bilirubin Total: <0.2 mg/dL (ref 0.0–1.2)
CO2: 24 mmol/L (ref 20–29)
Calcium: 9.4 mg/dL (ref 8.7–10.2)
Chloride: 104 mmol/L (ref 96–106)
Creatinine, Ser: 0.87 mg/dL (ref 0.57–1.00)
Globulin, Total: 2.9 g/dL (ref 1.5–4.5)
Glucose: 86 mg/dL (ref 65–99)
Potassium: 4.7 mmol/L (ref 3.5–5.2)
Sodium: 145 mmol/L — ABNORMAL HIGH (ref 134–144)
Total Protein: 6.5 g/dL (ref 6.0–8.5)
eGFR: 82 mL/min/{1.73_m2} (ref >59)

## 2021-04-30 LAB — LYME DISEASE, WESTERN BLOT
IgG P18 Ab.: ABSENT
IgG P23 Ab.: ABSENT
IgG P28 Ab.: ABSENT
IgG P30 Ab.: ABSENT
IgG P39 Ab.: ABSENT
IgG P45 Ab.: ABSENT
IgG P66 Ab.: ABSENT
IgG P93 Ab.: ABSENT
IgM P23 Ab.: ABSENT
IgM P39 Ab.: ABSENT
IgM P41 Ab.: ABSENT
Lyme IgG Wb: NEGATIVE
Lyme IgM Wb: NEGATIVE

## 2021-04-30 LAB — RMSF, IGG, IFA: RMSF, IGG, IFA: <1:64 {titer}

## 2021-04-30 LAB — ROCKY MTN SPOTTED FVR ABS PNL(IGG+IGM)
RMSF IgG: POSITIVE — AB
RMSF IgM: 0.38 index (ref 0.00–0.89)

## 2021-04-30 LAB — TSH: TSH: 3.19 u[IU]/mL (ref 0.450–4.500)

## 2021-05-05 ENCOUNTER — Ambulatory Visit: Admitting: Nurse Practitioner

## 2021-05-09 ENCOUNTER — Ambulatory Visit (INDEPENDENT_AMBULATORY_CARE_PROVIDER_SITE_OTHER): Admitting: Vascular Surgery

## 2021-05-09 ENCOUNTER — Telehealth: Payer: Self-pay

## 2021-05-09 ENCOUNTER — Ambulatory Visit (INDEPENDENT_AMBULATORY_CARE_PROVIDER_SITE_OTHER)

## 2021-05-09 ENCOUNTER — Other Ambulatory Visit: Payer: Self-pay

## 2021-05-09 VITALS — BP 118/70 | HR 66 | Ht 66.0 in | Wt 214.0 lb

## 2021-05-09 DIAGNOSIS — K219 Gastro-esophageal reflux disease without esophagitis: Secondary | ICD-10-CM | POA: Diagnosis not present

## 2021-05-09 DIAGNOSIS — I6523 Occlusion and stenosis of bilateral carotid arteries: Secondary | ICD-10-CM

## 2021-05-09 DIAGNOSIS — I63239 Cerebral infarction due to unspecified occlusion or stenosis of unspecified carotid arteries: Secondary | ICD-10-CM | POA: Diagnosis not present

## 2021-05-09 DIAGNOSIS — I10 Essential (primary) hypertension: Secondary | ICD-10-CM | POA: Diagnosis not present

## 2021-05-09 DIAGNOSIS — Z658 Other specified problems related to psychosocial circumstances: Secondary | ICD-10-CM | POA: Insufficient documentation

## 2021-05-09 DIAGNOSIS — E785 Hyperlipidemia, unspecified: Secondary | ICD-10-CM | POA: Diagnosis not present

## 2021-05-09 NOTE — Progress Notes (Signed)
MRN : [id number]  Latasha Lawrence is a 50 y.o. ([birth year]) female who presents with chief complaint of  Chief Complaint  Patient presents with   Carotid   New Patient (Initial Visit)    Np lane right carotid stenosis   .  History of Present Illness: The patient is seen for evaluation of carotid stenosis. The carotid stenosis was identified after she suffered several strokes.  Duplex ultrasound at that time showed a 60-79% RICA stenosis consistent with a carotid dissection.  No history of blunt trauma.   The patient denies recent amaurosis fugax. There is no recent history of TIA symptoms or focal motor deficits. There is a prior documented CVA.  There is no history of migraine headaches. There is no history of seizures.  The patient is taking Plavix 75 mg daily.  The patient has a history of coronary artery disease, no recent episodes of angina or shortness of breath. The patient denies PAD or claudication symptoms. There is a history of hyperlipidemia which is being treated with a statin.    Duplex ultrasound of the carotids shows bilateral carotids are essentially normal, there has been resolution of previous lesion.  Current Meds  Medication Sig   ALPRAZolam (XANAX) 0.5 MG tablet Take 1 tablet (0.5 mg total) by mouth at bedtime as needed for anxiety.   atorvastatin (LIPITOR) 80 MG tablet Take 1 tablet (80 mg total) by mouth daily at 6 PM.   butalbital-acetaminophen-caffeine (FIORICET) 50-325-40 MG tablet Take 1-2 tablets by mouth every 6 (six) hours as needed for headache.   clopidogrel (PLAVIX) 75 MG tablet Take 1 tablet (75 mg total) by mouth daily.   doxycycline (VIBRA-TABS) 100 MG tablet Take 1 tablet (100 mg total) by mouth 2 (two) times daily for 21 days.   doxycycline (VIBRAMYCIN) 100 MG capsule Take 100 mg by mouth 2 (two) times daily.   fluticasone (FLONASE) 50 MCG/ACT nasal spray Place 1 spray into both nostrils in the morning and at bedtime.   hydrochlorothiazide  (HYDRODIURIL) 25 MG tablet Take 1 tablet (25 mg total) by mouth daily.   hydrOXYzine (VISTARIL) 100 MG capsule Take 1 capsule (100 mg total) by mouth 3 (three) times daily as needed.   losartan (COZAAR) 50 MG tablet Take 1 tablet (50 mg total) by mouth daily.   nicotine (NICODERM CQ) 21 mg/24hr patch Place 1 patch (21 mg total) onto the skin daily.   ondansetron (ZOFRAN ODT) 4 MG disintegrating tablet Take 1 tablet (4 mg total) by mouth every 8 (eight) hours as needed for nausea or vomiting.   pantoprazole (PROTONIX) 40 MG tablet Take 1 tablet (40 mg total) by mouth daily.   spironolactone (ALDACTONE) 25 MG tablet Take 1 tablet (25 mg total) by mouth daily.   traZODone (DESYREL) 100 MG tablet Take 2 tablets (200 mg total) by mouth at bedtime as needed for sleep.   venlafaxine XR (EFFEXOR XR) 75 MG 24 hr capsule Take 1 capsule (75 mg total) by mouth daily with breakfast.    Past Medical History:  Diagnosis Date   Abnormal weight gain    Anxiety    Chronic back pain    Controlled substance agreement terminated 07/2014   failed UDS at [facility]   Depression    Fibromyalgia    GERD (gastroesophageal reflux disease)    Headache    Hirsutism    Hypertension    Hypokalemia    Neutrophilic leukocytosis    Periodontal disease  Stomach ulcer    Stroke (HCC)    Vitamin D deficiency disease     Past Surgical History:  Procedure Laterality Date   Arm Surgery     CESAREAN SECTION     IR ANGIO INTRA EXTRACRAN SEL COM CAROTID INNOMINATE BILAT MOD SED  04/19/2017   IR ANGIO VERTEBRAL SEL VERTEBRAL BILAT MOD SED  04/19/2017   IR INTRAVSC STENT CERV CAROTID W/O EMB-PROT MOD SED INC ANGIO  04/25/2017   RADIOLOGY WITH ANESTHESIA Left 04/25/2017   Procedure: RADIOLOGY WITH ANESTHESIA LEFT CARDIAC  STENT;  Surgeon: Julieanne Cotton, MD;  Location: [facility] OR;  Service: Radiology;  Laterality: Left;    Social History Social History   Tobacco Use   Smoking status: Every Day     Packs/day: 0.50    Years: 40.00    Pack years: 20.00    Types: Cigarettes   Smokeless tobacco: Never  Vaping Use   Vaping Use: Former  Substance Use Topics   Alcohol use: No   Drug use: No    Family History Family History  Problem Relation Age of Onset   Arthritis Mother    Cancer Mother        breast   Hyperlipidemia Father    Hypertension Father    Stroke Maternal Grandmother     Allergies  Allergen Reactions   Nsaids Other (See Comments)    Causes ulcers to bleed   Morphine And Related Itching     REVIEW OF SYSTEMS (Negative unless checked)  Constitutional: [] Weight loss  [] Fever  [] Chills Cardiac: [] Chest pain   [] Chest pressure   [] Palpitations   [] Shortness of breath when laying flat   [] Shortness of breath with exertion. Vascular:  [] Pain in legs with walking   [] Pain in legs at rest  [] History of DVT   [] Phlebitis   [] Swelling in legs   [] Varicose veins   [] Non-healing ulcers Pulmonary:   [] Uses home oxygen   [] Productive cough   [] Hemoptysis   [] Wheeze  [] COPD   [] Asthma Neurologic:  [] Dizziness   [] Seizures   [x] History of stroke   [] History of TIA  [] Aphasia   [] Vissual changes   [] Weakness or numbness in arm   [] Weakness or numbness in leg Musculoskeletal:   [] Joint swelling   [] Joint pain   [] Low back pain Hematologic:  [] Easy bruising  [] Easy bleeding   [] Hypercoagulable state   [] Anemic Gastrointestinal:  [] Diarrhea   [] Vomiting  [x] Gastroesophageal reflux/heartburn   [] Difficulty swallowing. Genitourinary:  [] Chronic kidney disease   [] Difficult urination  [] Frequent urination   [] Blood in urine Skin:  [] Rashes   [] Ulcers  Psychological:  [] History of anxiety   []  History of major depression.  Physical Examination  Vitals:   05/09/21 1434  BP: 118/70  Pulse: 66  Weight: 214 lb (97.1 kg)  Height: 5\' 6"  (1.676 m)   Body mass index is 34.54 kg/m. Gen: WD/WN, NAD Head: [place]/AT, No temporalis wasting.  Ear/Nose/Throat: Hearing grossly intact, nares  w/o erythema or drainage, poor dentition Eyes: PER, EOMI, sclera nonicteric.  Neck: Supple, no masses.  No bruit or JVD.  Pulmonary:  Good air movement, clear to auscultation bilaterally, no use of accessory muscles.  Cardiac: RRR, normal S1, S2, no Murmurs. Vascular:  No carotid bruits Vessel Right Left  Radial Palpable Palpable  Carotid Palpable Palpable  Gastrointestinal: soft, non-distended. No guarding/no peritoneal signs.  Musculoskeletal: M/S 5/5 throughout.  No deformity or atrophy.  Neurologic: CN 2-12 intact. Pain and light touch intact in extremities.  Symmetrical.  Speech is fluent. Motor exam as listed above. Psychiatric: Judgment intact, Mood & affect appropriate for pt's clinical situation. Dermatologic: No rashes or ulcers noted.  No changes consistent with cellulitis.   CBC Lab Results  Component Value Date   WBC 6.5 04/14/2021   HGB 13.2 04/14/2021   HCT 42.6 04/14/2021   MCV 91 04/14/2021   PLT 282 04/14/2021    BMET    Component Value Date/Time   NA 145 (H) 04/14/2021 1543   NA 138 09/06/2014 0132   K 4.7 04/14/2021 1543   K 3.8 09/06/2014 0132   CL 104 04/14/2021 1543   CL 105 09/06/2014 0132   CO2 24 04/14/2021 1543   CO2 27 09/06/2014 0132   GLUCOSE 86 04/14/2021 1543   GLUCOSE 116 (H) 03/18/2021 1514   GLUCOSE 105 (H) 09/06/2014 0132   BUN 10 04/14/2021 1543   BUN 8 09/06/2014 0132   CREATININE 0.87 04/14/2021 1543   CREATININE 0.97 09/06/2014 0132   CALCIUM 9.4 04/14/2021 1543   CALCIUM 9.4 09/06/2014 0132   GFRNONAA >60 03/18/2021 1514   GFRNONAA >60 09/06/2014 0132   GFRNONAA >60 01/21/2014 0531   GFRAA 77 02/19/2020 1452   GFRAA >60 09/06/2014 0132   GFRAA >60 01/21/2014 0531   CrCl cannot be calculated (Patient's most recent lab result is older than the maximum 21 days allowed.).  COAG Lab Results  Component Value Date   INR 1.1 03/18/2021   INR 1.0 06/27/2019   INR 1.11 04/25/2017    Radiology No results  found.   Assessment/Plan 1. Carotid stenosis, symptomatic, with infarction [facility]) Recommend:  Given the patient's asymptomatic subcritical stenosis no further invasive testing or surgery at this time.  Duplex ultrasound shows essential resolution of her previously noted right sided stenosis.  She wishes to stop the Plavix.  I have recommended that she stop the Plavix and begin EC ASA 81 mg po daily.  I will rescan her in one year  Continue management of CAD, HTN and Hyperlipidemia Healthy heart diet,  encouraged exercise at least 4 times per week Follow up in 12 months with duplex ultrasound and physical exam.    - VAS US CAROTID; Future  2. Hyperlipidemia, unspecified hyperlipidemia type Continue statin as ordered and reviewed, no changes at this time   3. Primary hypertension Continue antihypertensive medications as already ordered, these medications have been reviewed and there are no changes at this time.   4. Gastroesophageal reflux disease, unspecified whether esophagitis present Continue PPI as already ordered, this medication has been reviewed and there are no changes at this time.  Avoidence of caffeine and alcohol  Moderate elevation of the head of the bed       Levora Dredge, MD  05/09/2021 2:40 PM

## 2021-05-09 NOTE — Telephone Encounter (Signed)
Left message for patient to call back and reschedule appointment.

## 2021-05-11 ENCOUNTER — Other Ambulatory Visit: Payer: Self-pay

## 2021-05-11 ENCOUNTER — Ambulatory Visit (INDEPENDENT_AMBULATORY_CARE_PROVIDER_SITE_OTHER): Admitting: Nurse Practitioner

## 2021-05-11 ENCOUNTER — Encounter: Payer: Self-pay | Admitting: Nurse Practitioner

## 2021-05-11 VITALS — BP 111/78 | HR 73 | Temp 99.4°F | Wt 214.0 lb

## 2021-05-11 DIAGNOSIS — W57XXXA Bitten or stung by nonvenomous insect and other nonvenomous arthropods, initial encounter: Secondary | ICD-10-CM

## 2021-05-11 DIAGNOSIS — S80861A Insect bite (nonvenomous), right lower leg, initial encounter: Secondary | ICD-10-CM

## 2021-05-11 MED ORDER — MONTELUKAST SODIUM 10 MG PO TABS: 10.0000 mg | ORAL_TABLET | Freq: Every day | ORAL | 3 refills | Status: DC

## 2021-05-11 NOTE — Progress Notes (Signed)
BP 111/78   Pulse 73   Temp 99.4 F (37.4 C) (Oral)   Wt 214 lb (97.1 kg)   SpO2 95%   BMI 34.54 kg/m    Subjective:    Patient ID: Latasha Lawrence, female    DOB: 1971-08-26, 50 y.o.   MRN: [id number]  HPI: Latasha Lawrence is a 51 y.o. female  Chief Complaint  Patient presents with   Insect Bite    Patient states she woke up yesterday morning and had pain in her leg and she couldn't put full pressure on it and was told by her significant other that she needs to come in and have it looked at. Patient states she noticed swelling and it was red and sore. Patient state she does not see an actual bite, but has pain and redness is gone today.    Sinus Problem    Patient states for the past week she has been having issues with her sinuses and not being able to clear them out and states there is no discoloration to the mucus, but first noticed the issue with she started taking the antibiotic about a week and a half ago.    INSECT BITE Patient states she woke up yesterday morning and had pain in her leg and she couldn't put full pressure on it and was told by her significant other that she needs to come in and have it looked at. Patient states she noticed swelling and it was red and sore. Patient state she does not see an actual bite, but has pain and redness is gone today.    Relevant past medical, surgical, family and social history reviewed and updated as indicated. Interim medical history since our last visit reviewed. Allergies and medications reviewed and updated.  Review of Systems  Skin:        No pain or redness on right leg.   Per HPI unless specifically indicated above     Objective:    BP 111/78   Pulse 73   Temp 99.4 F (37.4 C) (Oral)   Wt 214 lb (97.1 kg)   SpO2 95%   BMI 34.54 kg/m   Wt Readings from Last 3 Encounters:  05/11/21 214 lb (97.1 kg)  05/09/21 214 lb (97.1 kg)  04/14/21 216 lb (98 kg)    Physical Exam Vitals and nursing note reviewed.   Constitutional:      General: She is not in acute distress.    Appearance: Normal appearance. She is normal weight. She is not ill-appearing, toxic-appearing or diaphoretic.  HENT:     Head: Normocephalic.     Right Ear: External ear normal.     Left Ear: External ear normal.     Nose: Nose normal.     Mouth/Throat:     Mouth: Mucous membranes are moist.     Pharynx: Oropharynx is clear.  Eyes:     General:        Right eye: No discharge.        Left eye: No discharge.     Extraocular Movements: Extraocular movements intact.     Conjunctiva/sclera: Conjunctivae normal.     Pupils: Pupils are equal, round, and reactive to light.  Cardiovascular:     Rate and Rhythm: Normal rate and regular rhythm.     Heart sounds: No murmur heard. Pulmonary:     Effort: Pulmonary effort is normal. No respiratory distress.     Breath sounds: Normal breath sounds. No wheezing or  rales.  Musculoskeletal:     Cervical back: Normal range of motion and neck supple.  Skin:    General: Skin is warm and dry.     Capillary Refill: Capillary refill takes less than 2 seconds.     Comments: No insect bite, redness, swelling or drainage from leg.  Neurological:     General: No focal deficit present.     Mental Status: She is alert and oriented to person, place, and time. Mental status is at baseline.  Psychiatric:        Mood and Affect: Mood normal.        Behavior: Behavior normal.        Thought Content: Thought content normal.        Judgment: Judgment normal.    Results for orders placed or performed in visit on 04/14/21  Comp Met (CMET)  Result Value Ref Range   Glucose 86 65 - 99 mg/dL   BUN 10 6 - 24 mg/dL   Creatinine, Ser 0.87 0.57 - 1.00 mg/dL   eGFR 82 >59 mL/min/1.73   BUN/Creatinine Ratio 11 9 - 23   Sodium 145 (H) 134 - 144 mmol/L   Potassium 4.7 3.5 - 5.2 mmol/L   Chloride 104 96 - 106 mmol/L   CO2 24 20 - 29 mmol/L   Calcium 9.4 8.7 - 10.2 mg/dL   Total Protein 6.5 6.0 - 8.5  g/dL   Albumin 3.6 (L) 3.8 - 4.8 g/dL   Globulin, Total 2.9 1.5 - 4.5 g/dL   Albumin/Globulin Ratio 1.2 1.2 - 2.2   Bilirubin Total <0.2 0.0 - 1.2 mg/dL   Alkaline Phosphatase 92 44 - 121 IU/L   AST 15 0 - 40 IU/L   ALT 15 0 - 32 IU/L  CBC w/Diff  Result Value Ref Range   WBC 6.5 3.4 - 10.8 x10E3/uL   RBC 4.69 3.77 - 5.28 x10E6/uL   Hemoglobin 13.2 11.1 - 15.9 g/dL   Hematocrit 42.6 34.0 - 46.6 %   MCV 91 79 - 97 fL   MCH 28.1 26.6 - 33.0 pg   MCHC 31.0 (L) 31.5 - 35.7 g/dL   RDW 13.1 11.7 - 15.4 %   Platelets 282 150 - 450 x10E3/uL   Neutrophils 72 Not Estab. %   Lymphs 19 Not Estab. %   Monocytes 7 Not Estab. %   Eos 2 Not Estab. %   Basos 0 Not Estab. %   Neutrophils Absolute 4.6 1.4 - 7.0 x10E3/uL   Lymphocytes Absolute 1.3 0.7 - 3.1 x10E3/uL   Monocytes Absolute 0.5 0.1 - 0.9 x10E3/uL   EOS (ABSOLUTE) 0.2 0.0 - 0.4 x10E3/uL   Basophils Absolute 0.0 0.0 - 0.2 x10E3/uL   Immature Granulocytes 0 Not Estab. %   Immature Grans (Abs) 0.0 0.0 - 0.1 x10E3/uL  Lyme disease, western blot  Result Value Ref Range     IgG P93 Ab. Absent      IgG P66 Ab. Absent      IgG P58 Ab. Present (A)      IgG P45 Ab. Absent      IgG P41 Ab. Present (A)      IgG P39 Ab. Absent      IgG P30 Ab. Absent      IgG P28 Ab. Absent      IgG P23 Ab. Absent      IgG P18 Ab. Absent    Lyme IgG Wb Negative      IgM P41 Ab. Absent  IgM P39 Ab. Absent      IgM P23 Ab. Absent    Lyme IgM Wb Negative   Rocky mtn spotted fvr abs pnl(IgG+IgM)  Result Value Ref Range   RMSF IgG Positive (A) Negative   RMSF IgM 0.38 0.00 - 0.89 index  Antinuclear Antib (ANA)  Result Value Ref Range   Anti Nuclear Antibody (ANA) Negative Negative  TSH  Result Value Ref Range   TSH 3.190 0.450 - 4.500 uIU/mL  RMSF, IgG, IFA  Result Value Ref Range   RMSF, IGG, IFA <1:64 Neg <1:64      Assessment & Plan:   Problem List Items Addressed This Visit   None Visit Diagnoses     Insect bite of right lower leg,  initial encounter    -  Primary   No evidence of insect bite. Patient already on doxycyline. If there was an infection it would be taken care of by the doxycyline.        Follow up plan: Return if symptoms worsen or fail to improve.   A total of 20 minutes were spent on this encounter today.  When total time is documented, this includes both the face-to-face and non-face-to-face time personally spent before, during and after the visit on the date of the encounter.

## 2021-05-12 ENCOUNTER — Ambulatory Visit: Admitting: Dermatology

## 2021-05-13 ENCOUNTER — Ambulatory Visit: Admitting: Nurse Practitioner

## 2021-05-23 ENCOUNTER — Telehealth: Payer: Self-pay | Admitting: Nurse Practitioner

## 2021-05-23 MED ORDER — DOXYCYCLINE HYCLATE 100 MG PO CAPS: 100.0000 mg | ORAL_CAPSULE | Freq: Two times a day (BID) | ORAL | 0 refills | Status: AC

## 2021-05-23 NOTE — Telephone Encounter (Signed)
Pt still havinfg rocky mtn fever sx: fatigue, headache nausea, muscle pain. Pt finished abx a few days ago.  Wants to know if she should continue with abx since she is still symptomatic?

## 2021-06-04 ENCOUNTER — Emergency Department
Admission: EM | Admit: 2021-06-04 | Discharge: 2021-06-05 | Disposition: A | Discharge status: Home / Self Care | Attending: Emergency Medicine | Admitting: Emergency Medicine

## 2021-06-04 ENCOUNTER — Other Ambulatory Visit: Payer: Self-pay

## 2021-06-04 DIAGNOSIS — F1410 Cocaine abuse, uncomplicated: Secondary | ICD-10-CM | POA: Insufficient documentation

## 2021-06-04 DIAGNOSIS — Z20822 Contact with and (suspected) exposure to covid-19: Secondary | ICD-10-CM | POA: Diagnosis not present

## 2021-06-04 DIAGNOSIS — Y900 Blood alcohol level of less than 20 mg/100 ml: Secondary | ICD-10-CM | POA: Diagnosis not present

## 2021-06-04 DIAGNOSIS — I10 Essential (primary) hypertension: Secondary | ICD-10-CM | POA: Diagnosis not present

## 2021-06-04 DIAGNOSIS — F16150 Hallucinogen abuse with hallucinogen-induced psychotic disorder with delusions: Secondary | ICD-10-CM | POA: Diagnosis present

## 2021-06-04 DIAGNOSIS — F17210 Nicotine dependence, cigarettes, uncomplicated: Secondary | ICD-10-CM | POA: Diagnosis not present

## 2021-06-04 DIAGNOSIS — F1910 Other psychoactive substance abuse, uncomplicated: Secondary | ICD-10-CM

## 2021-06-04 DIAGNOSIS — Z046 Encounter for general psychiatric examination, requested by authority: Secondary | ICD-10-CM | POA: Diagnosis present

## 2021-06-04 DIAGNOSIS — Z79899 Other long term (current) drug therapy: Secondary | ICD-10-CM | POA: Insufficient documentation

## 2021-06-04 DIAGNOSIS — F32A Depression, unspecified: Secondary | ICD-10-CM

## 2021-06-04 DIAGNOSIS — F1920 Other psychoactive substance dependence, uncomplicated: Secondary | ICD-10-CM | POA: Diagnosis present

## 2021-06-04 LAB — POC URINE PREG, ED: Preg Test, Ur: NEGATIVE

## 2021-06-04 LAB — URINALYSIS, COMPLETE (UACMP) WITH MICROSCOPIC
Bilirubin Urine: NEGATIVE
Glucose, UA: NEGATIVE mg/dL
Ketones, ur: 20 mg/dL — AB
Leukocytes,Ua: NEGATIVE
Nitrite: NEGATIVE
Protein, ur: 100 mg/dL — AB
Specific Gravity, Urine: 1.023 (ref 1.005–1.030)
pH: 6 (ref 5.0–8.0)

## 2021-06-04 LAB — CBC
HCT: 45.3 % (ref 36.0–46.0)
Hemoglobin: 15.9 g/dL — ABNORMAL HIGH (ref 12.0–15.0)
MCH: 30.2 pg (ref 26.0–34.0)
MCHC: 35.1 g/dL (ref 30.0–36.0)
MCV: 86 fL (ref 80.0–100.0)
Platelets: 342 10*3/uL (ref 150–400)
RBC: 5.27 MIL/uL — ABNORMAL HIGH (ref 3.87–5.11)
RDW: 13.3 % (ref 11.5–15.5)
WBC: 17.2 10*3/uL — ABNORMAL HIGH (ref 4.0–10.5)
nRBC: 0 % (ref 0.0–0.2)

## 2021-06-04 LAB — SALICYLATE LEVEL: Salicylate Lvl: <7 mg/dL — ABNORMAL LOW (ref 7.0–30.0)

## 2021-06-04 LAB — COMPREHENSIVE METABOLIC PANEL
ALT: 28 U/L (ref 0–44)
AST: 20 U/L (ref 15–41)
Albumin: 4.5 g/dL (ref 3.5–5.0)
Alkaline Phosphatase: 70 U/L (ref 38–126)
Anion gap: 9 (ref 5–15)
BUN: 10 mg/dL (ref 6–20)
CO2: 28 mmol/L (ref 22–32)
Calcium: 9.7 mg/dL (ref 8.9–10.3)
Chloride: 105 mmol/L (ref 98–111)
Creatinine, Ser: 0.83 mg/dL (ref 0.44–1.00)
GFR, Estimated: >60 mL/min (ref >60)
Glucose, Bld: 135 mg/dL — ABNORMAL HIGH (ref 70–99)
Potassium: 2.8 mmol/L — ABNORMAL LOW (ref 3.5–5.1)
Sodium: 142 mmol/L (ref 135–145)
Total Bilirubin: 1 mg/dL (ref 0.3–1.2)
Total Protein: 8.1 g/dL (ref 6.5–8.1)

## 2021-06-04 LAB — URINE DRUG SCREEN, QUALITATIVE (ARMC ONLY)
Amphetamines, Ur Screen: NOT DETECTED
Barbiturates, Ur Screen: NOT DETECTED
Benzodiazepine, Ur Scrn: NOT DETECTED
Cannabinoid 50 Ng, Ur ~~LOC~~: NOT DETECTED
Cocaine Metabolite,Ur ~~LOC~~: POSITIVE — AB
MDMA (Ecstasy)Ur Screen: NOT DETECTED
Methadone Scn, Ur: NOT DETECTED
Opiate, Ur Screen: NOT DETECTED
Phencyclidine (PCP) Ur S: NOT DETECTED
Tricyclic, Ur Screen: POSITIVE — AB

## 2021-06-04 LAB — ACETAMINOPHEN LEVEL: Acetaminophen (Tylenol), Serum: 50 ug/mL — ABNORMAL HIGH (ref 10–30)

## 2021-06-04 LAB — ETHANOL: Alcohol, Ethyl (B): <10 mg/dL (ref <10)

## 2021-06-04 LAB — RESP PANEL BY RT-PCR (FLU A&B, COVID) ARPGX2
Influenza A by PCR: NEGATIVE
Influenza B by PCR: NEGATIVE
SARS Coronavirus 2 by RT PCR: NEGATIVE

## 2021-06-04 MED ORDER — TRAZODONE HCL 100 MG PO TABS
200.0000 mg | ORAL_TABLET | Freq: Every evening | ORAL | Status: DC | PRN
  Administered 2021-06-04: 200 mg via ORAL
  Filled 2021-06-04: qty 2

## 2021-06-04 MED ORDER — LORAZEPAM 1 MG PO TABS
1.0000 mg | ORAL_TABLET | Freq: Once | ORAL | Status: AC
  Administered 2021-06-04: 1 mg via ORAL
  Filled 2021-06-04: qty 1

## 2021-06-04 MED ORDER — ATORVASTATIN CALCIUM 20 MG PO TABS
80.0000 mg | ORAL_TABLET | Freq: Every day | ORAL | Status: DC
  Administered 2021-06-04: 80 mg via ORAL
  Filled 2021-06-04: qty 4

## 2021-06-04 MED ORDER — ALPRAZOLAM 0.5 MG PO TABS
0.5000 mg | ORAL_TABLET | Freq: Every evening | ORAL | Status: DC | PRN
  Administered 2021-06-04: 0.5 mg via ORAL
  Filled 2021-06-04: qty 1

## 2021-06-04 MED ORDER — HALOPERIDOL 2 MG PO TABS
2.0000 mg | ORAL_TABLET | Freq: Every day | ORAL | Status: DC
  Administered 2021-06-04: 2 mg via ORAL
  Filled 2021-06-04 (×3): qty 1

## 2021-06-04 MED ORDER — GABAPENTIN 100 MG PO CAPS
100.0000 mg | ORAL_CAPSULE | Freq: Three times a day (TID) | ORAL | Status: DC
  Administered 2021-06-04 – 2021-06-05 (×2): 100 mg via ORAL
  Filled 2021-06-04 (×2): qty 1

## 2021-06-04 MED ORDER — HYDROCHLOROTHIAZIDE 25 MG PO TABS
25.0000 mg | ORAL_TABLET | Freq: Every day | ORAL | Status: DC
  Administered 2021-06-04 – 2021-06-05 (×2): 25 mg via ORAL
  Filled 2021-06-04 (×2): qty 1

## 2021-06-04 MED ORDER — VENLAFAXINE HCL ER 75 MG PO CP24
75.0000 mg | ORAL_CAPSULE | Freq: Every day | ORAL | Status: DC
  Administered 2021-06-05: 75 mg via ORAL
  Filled 2021-06-04: qty 1

## 2021-06-04 MED ORDER — NICOTINE 21 MG/24HR TD PT24
21.0000 mg | MEDICATED_PATCH | Freq: Every day | TRANSDERMAL | Status: DC
  Administered 2021-06-04 – 2021-06-05 (×2): 21 mg via TRANSDERMAL
  Filled 2021-06-04 (×2): qty 1

## 2021-06-04 MED ORDER — BUTALBITAL-APAP-CAFFEINE 50-325-40 MG PO TABS: 1.0000 | ORAL_TABLET | Freq: Four times a day (QID) | ORAL | Status: DC | PRN

## 2021-06-04 MED ORDER — MONTELUKAST SODIUM 10 MG PO TABS: 10.0000 mg | ORAL_TABLET | Freq: Every day | ORAL | Status: DC

## 2021-06-04 MED ORDER — LOSARTAN POTASSIUM 50 MG PO TABS: 50.0000 mg | ORAL_TABLET | Freq: Every day | ORAL | Status: DC

## 2021-06-04 MED ORDER — PANTOPRAZOLE SODIUM 40 MG PO TBEC
40.0000 mg | DELAYED_RELEASE_TABLET | Freq: Every day | ORAL | Status: DC
  Administered 2021-06-04 – 2021-06-05 (×2): 40 mg via ORAL
  Filled 2021-06-04 (×2): qty 1

## 2021-06-04 MED ORDER — SPIRONOLACTONE 25 MG PO TABS
25.0000 mg | ORAL_TABLET | Freq: Every day | ORAL | Status: DC
  Administered 2021-06-04 – 2021-06-05 (×2): 25 mg via ORAL
  Filled 2021-06-04 (×2): qty 1

## 2021-06-04 NOTE — ED Notes (Signed)
Latasha Lawrence, [phone]. Friend.

## 2021-06-04 NOTE — ED Notes (Signed)
Pt notified of need for urine sample as one has not been collected during this visit.
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
## 2021-06-04 NOTE — ED Notes (Signed)
Pt advised she was feeling anxious and needs meds to calm her nerves. Asked MD and he ordered PO ativan.

## 2021-06-04 NOTE — ED Notes (Signed)
Pt to bathroom without cup, does not provide sample. Does not speak to staff
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
## 2021-06-04 NOTE — ED Notes (Signed)
Report received from chrystal, English as a second language teacher. Patient alert and oriented, warm and dry, and in no acute distress. Patient denies SI, HI, AVH and pain. Patient made aware of Q15 minute rounds and Psychologist, counselling presence for their safety. Patient instructed to come to this nurse with needs or concerns.
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
## 2021-06-04 NOTE — ED Notes (Signed)
Pt belongings: Jeans, black sneakers, phone with cracked screen, white socks, one pair, tank top and t shirt,

## 2021-06-05 MED ORDER — LORAZEPAM 2 MG PO TABS
2.0000 mg | ORAL_TABLET | Freq: Once | ORAL | Status: AC
  Administered 2021-06-05: 2 mg via ORAL
  Filled 2021-06-05: qty 1
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
## 2021-06-05 NOTE — ED Notes (Signed)
Pt awake, complaining of pacing and inability to sleep. Request more help with sleep, will speak to Dr. York Cerise.

## 2021-06-05 NOTE — ED Notes (Signed)
Pt claims that she was told that her UDS had shown ectasy and heroin. Informed pt that no, her UDS only shows tricyclics and cocaine. Pt states she has been "told 3 different things" about UDS results.

## 2021-06-05 NOTE — Discharge Instructions (Addendum)
Please seek medical attention and help for any thoughts about wanting to harm yourself, harm others, any concerning change in behavior, severe depression, inappropriate drug use or any other new or concerning symptoms. ° °

## 2021-06-05 NOTE — ED Notes (Signed)
VOL/pending reassessment in the AM 

## 2021-06-05 NOTE — ED Notes (Signed)
Unable to obtain vitals at this time due to pt sleeping. Will attempt again at later time.

## 2021-06-05 NOTE — ED Notes (Signed)
Pt asleep at this time, unable to collect vitals. Will collect pt vitals once awake. 

## 2021-06-05 NOTE — ED Notes (Signed)
Patient given phone to call ride home.

## 2021-06-05 NOTE — ED Notes (Signed)
ED pyxis is out of 2mg  Haldol PO. Called pharmacy, they will send.

## 2021-06-05 NOTE — ED Notes (Signed)
Pt still going on rants about how other staff members told her that UDS showed ectasy and that "the doctor told me yesterday that my UDS had ectasy" and that this nurse must have looked at "wrong chart". Pt adamantly refused Haldol at this time.  Cleaned up floor where tea had spilled.

## 2021-06-06 ENCOUNTER — Ambulatory Visit: Admitting: Nurse Practitioner

## 2021-06-06 NOTE — Progress Notes (Deleted)
LMP 05/27/2021 (Exact Date)    Subjective:    Patient ID: Latasha Lawrence, female    DOB: 04-30-1971, 50 y.o.   MRN: [id number]  HPI: Latasha Lawrence is a 50 y.o. female  No chief complaint on file.  ANXIETY/DEPRESSION  SMOKING CESSATION Smoking Status: Smoking Amount: Smoking Onset:  Smoking Quit Date:  Smoking triggers: Type of tobacco use:  Children in the house: {Blank single:19197::"yes","no"} Other household members who smoke: {Blank single:19197::"yes","no"} Treatments attempted:  Pneumovax:   Relevant past medical, surgical, family and social history reviewed and updated as indicated. Interim medical history since our last visit reviewed. Allergies and medications reviewed and updated.  Review of Systems  Per HPI unless specifically indicated above     Objective:    LMP 05/27/2021 (Exact Date)   Wt Readings from Last 3 Encounters:  06/04/21 200 lb (90.7 kg)  05/11/21 214 lb (97.1 kg)  05/09/21 214 lb (97.1 kg)    Physical Exam  Results for orders placed or performed during the hospital encounter of 06/04/21  Resp Panel by RT-PCR (Flu A&B, Covid) Nasopharyngeal Swab   Specimen: Nasopharyngeal Swab; Nasopharyngeal(NP) swabs in vial transport medium  Result Value Ref Range   SARS Coronavirus 2 by RT PCR NEGATIVE NEGATIVE   Influenza A by PCR NEGATIVE NEGATIVE   Influenza B by PCR NEGATIVE NEGATIVE  Comprehensive metabolic panel  Result Value Ref Range   Sodium 142 135 - 145 mmol/L   Potassium 2.8 (L) 3.5 - 5.1 mmol/L   Chloride 105 98 - 111 mmol/L   CO2 28 22 - 32 mmol/L   Glucose, Bld 135 (H) 70 - 99 mg/dL   BUN 10 6 - 20 mg/dL   Creatinine, Ser 9.56 0.44 - 1.00 mg/dL   Calcium 9.7 8.9 - 21.3 mg/dL   Total Protein 8.1 6.5 - 8.1 g/dL   Albumin 4.5 3.5 - 5.0 g/dL   AST 20 15 - 41 U/L   ALT 28 0 - 44 U/L   Alkaline Phosphatase 70 38 - 126 U/L   Total Bilirubin 1.0 0.3 - 1.2 mg/dL   GFR, Estimated >08 >65 mL/min   Anion gap 9 5 - 15  Ethanol   Result Value Ref Range   Alcohol, Ethyl (B) <10 <10 mg/dL  cbc  Result Value Ref Range   WBC 17.2 (H) 4.0 - 10.5 K/uL   RBC 5.27 (H) 3.87 - 5.11 MIL/uL   Hemoglobin 15.9 (H) 12.0 - 15.0 g/dL   HCT 78.4 69.6 - 29.5 %   MCV 86.0 80.0 - 100.0 fL   MCH 30.2 26.0 - 34.0 pg   MCHC 35.1 30.0 - 36.0 g/dL   RDW 28.4 13.2 - 44.0 %   Platelets 342 150 - 400 K/uL   nRBC 0.0 0.0 - 0.2 %  Urine Drug Screen, Qualitative  Result Value Ref Range   Tricyclic, Ur Screen POSITIVE (A) NONE DETECTED   Amphetamines, Ur Screen NONE DETECTED NONE DETECTED   MDMA (Ecstasy)Ur Screen NONE DETECTED NONE DETECTED   Cocaine Metabolite,Ur [place] POSITIVE (A) NONE DETECTED   Opiate, Ur Screen NONE DETECTED NONE DETECTED   Phencyclidine (PCP) Ur S NONE DETECTED NONE DETECTED   Cannabinoid 50 Ng, Ur [place] NONE DETECTED NONE DETECTED   Barbiturates, Ur Screen NONE DETECTED NONE DETECTED   Benzodiazepine, Ur Scrn NONE DETECTED NONE DETECTED   Methadone Scn, Ur NONE DETECTED NONE DETECTED  Acetaminophen level  Result Value Ref Range   Acetaminophen (Tylenol), Serum 50 (H)  10 - 30 ug/mL  Salicylate level  Result Value Ref Range   Salicylate Lvl <7.0 (L) 7.0 - 30.0 mg/dL  Urinalysis, Complete w Microscopic  Result Value Ref Range   Color, Urine AMBER (A) YELLOW   APPearance CLOUDY (A) CLEAR   Specific Gravity, Urine 1.023 1.005 - 1.030   pH 6.0 5.0 - 8.0   Glucose, UA NEGATIVE NEGATIVE mg/dL   Hgb urine dipstick SMALL (A) NEGATIVE   Bilirubin Urine NEGATIVE NEGATIVE   Ketones, ur 20 (A) NEGATIVE mg/dL   Protein, ur 937 (A) NEGATIVE mg/dL   Nitrite NEGATIVE NEGATIVE   Leukocytes,Ua NEGATIVE NEGATIVE   RBC / HPF 11-20 0 - 5 RBC/hpf   WBC, UA 6-10 0 - 5 WBC/hpf   Bacteria, UA RARE (A) NONE SEEN   Squamous Epithelial / LPF 11-20 0 - 5   Mucus PRESENT    Hyaline Casts, UA PRESENT    Ca Oxalate Crys, UA PRESENT   POC urine preg, ED  Result Value Ref Range   Preg Test, Ur NEGATIVE NEGATIVE      Assessment &  Plan:   Problem List Items Addressed This Visit       Other   Anxiety and depression - Primary   Other Visit Diagnoses     Encounter for smoking cessation counseling            Follow up plan: No follow-ups on file.

## 2021-06-21 NOTE — Progress Notes (Signed)
BP 109/69   Pulse 84   Temp 98.5 F (36.9 C) (Oral)   Wt 207 lb 6.4 oz (94.1 kg)   LMP 05/27/2021 (Exact Date)   SpO2 95%   BMI 33.48 kg/m    Subjective:    Patient ID: Latasha Lawrence, female    DOB: [birth year], 50 y.o.   MRN: [id number]  HPI: Latasha Lawrence is a 50 y.o. female  Chief Complaint  Patient presents with   Anxiety   Depression   Nicotine Dependence   DEPRESSION/ANXIETY Patient states she has been taking the Effexor.  She feels like it could be working better. Patient states feels like her dose could be increased. She is no longer taking   Patient states she is having a hard time with heat intolerance. That last for a couple of hours.  Happens anytime during the day.  Not worse at night. Other people around her are cold.   Patient states she has been having some joint pain.  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  She used to jump out of planes for the [employer] and feels like her pain could be related to that.  She is wondering if she can get documentation to say that it is related.   SMOKING CESSATION Patient states the Nicoderm patches were not helping so she stopped using them.    Relevant past medical, surgical, family and social history reviewed and updated as indicated. Interim medical history since our last visit reviewed. Allergies and medications reviewed and updated.  Review of Systems  Endocrine: Positive for heat intolerance.  Musculoskeletal:        Knee, hip and ankle pain   Per HPI unless specifically indicated above     Objective:    BP 109/69   Pulse 84   Temp 98.5 F (36.9 C) (Oral)   Wt 207 lb 6.4 oz (94.1 kg)   LMP 05/27/2021 (Exact Date)   SpO2 95%   BMI 33.48 kg/m   Wt Readings from Last 3 Encounters:  06/22/21 207 lb 6.4 oz (94.1 kg)  06/04/21 200 lb (90.7 kg)  05/11/21 214 lb (97.1 kg)    Physical Exam Vitals and nursing note reviewed.  Constitutional:      General: She is not in acute  distress.    Appearance: Normal appearance. She is normal weight. She is not ill-appearing, toxic-appearing or diaphoretic.  HENT:     Head: Normocephalic.     Right Ear: External ear normal.     Left Ear: External ear normal.     Nose: Nose normal.     Mouth/Throat:     Mouth: Mucous membranes are moist.     Pharynx: Oropharynx is clear.  Eyes:     General:        Right eye: No discharge.        Left eye: No discharge.     Extraocular Movements: Extraocular movements intact.     Conjunctiva/sclera: Conjunctivae normal.     Pupils: Pupils are equal, round, and reactive to light.  Cardiovascular:     Rate and Rhythm: Normal rate and regular rhythm.     Heart sounds: No murmur heard. Pulmonary:     Effort: Pulmonary effort is normal. No respiratory distress.     Breath sounds: Normal breath sounds. No wheezing or rales.  Musculoskeletal:     Cervical back: Normal range of motion and neck supple.  Skin:  General: Skin is warm and dry.     Capillary Refill: Capillary refill takes less than 2 seconds.  Neurological:     General: No focal deficit present.     Mental Status: She is alert and oriented to person, place, and time. Mental status is at baseline.  Psychiatric:        Mood and Affect: Mood normal.        Behavior: Behavior normal.        Thought Content: Thought content normal.        Judgment: Judgment normal.    Results for orders placed or performed during the hospital encounter of 06/04/21  Resp Panel by RT-PCR (Flu A&B, Covid) Nasopharyngeal Swab   Specimen: Nasopharyngeal Swab; Nasopharyngeal(NP) swabs in vial transport medium  Result Value Ref Range   SARS Coronavirus 2 by RT PCR NEGATIVE NEGATIVE   Influenza A by PCR NEGATIVE NEGATIVE   Influenza B by PCR NEGATIVE NEGATIVE  Comprehensive metabolic panel  Result Value Ref Range   Sodium 142 135 - 145 mmol/L   Potassium 2.8 (L) 3.5 - 5.1 mmol/L   Chloride 105 98 - 111 mmol/L   CO2 28 22 - 32 mmol/L    Glucose, Bld 135 (H) 70 - 99 mg/dL   BUN 10 6 - 20 mg/dL   Creatinine, Ser 0.83 0.44 - 1.00 mg/dL   Calcium 9.7 8.9 - 10.3 mg/dL   Total Protein 8.1 6.5 - 8.1 g/dL   Albumin 4.5 3.5 - 5.0 g/dL   AST 20 15 - 41 U/L   ALT 28 0 - 44 U/L   Alkaline Phosphatase 70 38 - 126 U/L   Total Bilirubin 1.0 0.3 - 1.2 mg/dL   GFR, Estimated >60 >60 mL/min   Anion gap 9 5 - 15  Ethanol  Result Value Ref Range   Alcohol, Ethyl (B) <10 <10 mg/dL  cbc  Result Value Ref Range   WBC 17.2 (H) 4.0 - 10.5 K/uL   RBC 5.27 (H) 3.87 - 5.11 MIL/uL   Hemoglobin 15.9 (H) 12.0 - 15.0 g/dL   HCT 45.3 36.0 - 46.0 %   MCV 86.0 80.0 - 100.0 fL   MCH 30.2 26.0 - 34.0 pg   MCHC 35.1 30.0 - 36.0 g/dL   RDW 13.3 11.5 - 15.5 %   Platelets 342 150 - 400 K/uL   nRBC 0.0 0.0 - 0.2 %  Urine Drug Screen, Qualitative  Result Value Ref Range   Tricyclic, Ur Screen POSITIVE (A) NONE DETECTED   Amphetamines, Ur Screen NONE DETECTED NONE DETECTED   MDMA (Ecstasy)Ur Screen NONE DETECTED NONE DETECTED   Cocaine Metabolite,Ur [place] POSITIVE (A) NONE DETECTED   Opiate, Ur Screen NONE DETECTED NONE DETECTED   Phencyclidine (PCP) Ur S NONE DETECTED NONE DETECTED   Cannabinoid 50 Ng, Ur [place] NONE DETECTED NONE DETECTED   Barbiturates, Ur Screen NONE DETECTED NONE DETECTED   Benzodiazepine, Ur Scrn NONE DETECTED NONE DETECTED   Methadone Scn, Ur NONE DETECTED NONE DETECTED  Acetaminophen level  Result Value Ref Range   Acetaminophen (Tylenol), Serum 50 (H) 10 - 30 ug/mL  Salicylate level  Result Value Ref Range   Salicylate Lvl <4.6 (L) 7.0 - 30.0 mg/dL  Urinalysis, Complete w Microscopic  Result Value Ref Range   Color, Urine AMBER (A) YELLOW   APPearance CLOUDY (A) CLEAR   Specific Gravity, Urine 1.023 1.005 - 1.030   pH 6.0 5.0 - 8.0   Glucose, UA NEGATIVE NEGATIVE mg/dL  Hgb urine dipstick SMALL (A) NEGATIVE   Bilirubin Urine NEGATIVE NEGATIVE   Ketones, ur 20 (A) NEGATIVE mg/dL   Protein, ur 100 (A) NEGATIVE mg/dL    Nitrite NEGATIVE NEGATIVE   Leukocytes,Ua NEGATIVE NEGATIVE   RBC / HPF 11-20 0 - 5 RBC/hpf   WBC, UA 6-10 0 - 5 WBC/hpf   Bacteria, UA RARE (A) NONE SEEN   Squamous Epithelial / LPF 11-20 0 - 5   Mucus PRESENT    Hyaline Casts, UA PRESENT    Ca Oxalate Crys, UA PRESENT   POC urine preg, ED  Result Value Ref Range   Preg Test, Ur NEGATIVE NEGATIVE      Assessment & Plan:   Problem List Items Addressed This Visit       Other   Anxiety and depression - Primary    Chronic. Ongoing.  Not well controlled.  Recently hospitalized with worsening anxiety per the patient.  Will increase Effexor to 166m daily.  If symptoms are still not well controlled can consider adding Wellbutrin to regimen.       Relevant Medications   venlafaxine XR (EFFEXOR XR) 150 MG 24 hr capsule   Other Relevant Orders   Comp Met (CMET)   Comp Met (CMET)   Other Visit Diagnoses     Hypokalemia       Found in the hospital. Will recheck cmp in office today.    Relevant Orders   Comp Met (CMET)   Comp Met (CMET)   Hot flashes       Labs ordered today. Will make recommendatiobs based on lab results.    Relevant Orders   Thyroid Panel With TSH   Comp Met (CMET)   Chronic fatigue       Labs ordered today to evaluate chronic fatigue. Will make recommendations based on lab results.   Relevant Orders   Vitamin D (25 hydroxy)   Anemia Profile B   Chronic pain of both knees       Orders placed for xray to evaluate for arthritis. Discussed that letter can be written if evidence of arthritis exists.    Relevant Medications   venlafaxine XR (EFFEXOR XR) 150 MG 24 hr capsule   Other Relevant Orders   DG Knee Complete 4 Views Right   DG Knee Complete 4 Views Left   Unwanted hair       Updated referral placed for patient to see Dermatology.   Relevant Orders   Ambulatory referral to Dermatology   Cracked skin       Updated referral placed for patient to see Podiatry.    Relevant Orders   Ambulatory referral  to Podiatry   Need for influenza vaccination       Relevant Orders   Flu Vaccine QUAD 632moM (Fluarix, Fluzone & Alfiuria Quad PF) (Completed)   Encounter for screening mammogram for malignant neoplasm of breast       Relevant Orders   MM Digital Screening        Follow up plan: Return in about 2 months (around 08/22/2021) for Depression/Anxiety FU.

## 2021-06-22 ENCOUNTER — Other Ambulatory Visit: Payer: Self-pay

## 2021-06-22 ENCOUNTER — Ambulatory Visit (INDEPENDENT_AMBULATORY_CARE_PROVIDER_SITE_OTHER): Admitting: Nurse Practitioner

## 2021-06-22 ENCOUNTER — Encounter: Payer: Self-pay | Admitting: Nurse Practitioner

## 2021-06-22 VITALS — BP 109/69 | HR 84 | Temp 98.5°F | Wt 207.4 lb

## 2021-06-22 DIAGNOSIS — R5382 Chronic fatigue, unspecified: Secondary | ICD-10-CM | POA: Diagnosis not present

## 2021-06-22 DIAGNOSIS — Z23 Encounter for immunization: Secondary | ICD-10-CM

## 2021-06-22 DIAGNOSIS — F419 Anxiety disorder, unspecified: Secondary | ICD-10-CM

## 2021-06-22 DIAGNOSIS — M25561 Pain in right knee: Secondary | ICD-10-CM

## 2021-06-22 DIAGNOSIS — R234 Changes in skin texture: Secondary | ICD-10-CM

## 2021-06-22 DIAGNOSIS — M25562 Pain in left knee: Secondary | ICD-10-CM

## 2021-06-22 DIAGNOSIS — Z1231 Encounter for screening mammogram for malignant neoplasm of breast: Secondary | ICD-10-CM

## 2021-06-22 DIAGNOSIS — E876 Hypokalemia: Secondary | ICD-10-CM

## 2021-06-22 DIAGNOSIS — F32A Depression, unspecified: Secondary | ICD-10-CM

## 2021-06-22 DIAGNOSIS — R232 Flushing: Secondary | ICD-10-CM | POA: Diagnosis not present

## 2021-06-22 DIAGNOSIS — R7309 Other abnormal glucose: Secondary | ICD-10-CM

## 2021-06-22 DIAGNOSIS — F17200 Nicotine dependence, unspecified, uncomplicated: Secondary | ICD-10-CM

## 2021-06-22 DIAGNOSIS — G8929 Other chronic pain: Secondary | ICD-10-CM

## 2021-06-22 DIAGNOSIS — R6889 Other general symptoms and signs: Secondary | ICD-10-CM

## 2021-06-22 MED ORDER — VENLAFAXINE HCL ER 150 MG PO CP24: 150.0000 mg | ORAL_CAPSULE | Freq: Every day | ORAL | 0 refills | Status: DC

## 2021-06-22 NOTE — Assessment & Plan Note (Signed)
Chronic. Ongoing.  Not well controlled.  Recently hospitalized with worsening anxiety per the patient.  Will increase Effexor to 150mg  daily.  If symptoms are still not well controlled can consider adding Wellbutrin to regimen.

## 2021-06-23 LAB — ANEMIA PROFILE B
Basophils Absolute: 0 10*3/uL (ref 0.0–0.2)
Basos: 0 %
EOS (ABSOLUTE): 0.1 10*3/uL (ref 0.0–0.4)
Eos: 2 %
Ferritin: 40 ng/mL (ref 15–150)
Folate: 6.1 ng/mL (ref >3.0)
Hematocrit: 46.2 % (ref 34.0–46.6)
Hemoglobin: 15.4 g/dL (ref 11.1–15.9)
Immature Grans (Abs): 0 10*3/uL (ref 0.0–0.1)
Immature Granulocytes: 0 %
Iron Saturation: 16 % (ref 15–55)
Iron: 54 ug/dL (ref 27–159)
Lymphocytes Absolute: 1.3 10*3/uL (ref 0.7–3.1)
Lymphs: 18 %
MCH: 29.4 pg (ref 26.6–33.0)
MCHC: 33.3 g/dL (ref 31.5–35.7)
MCV: 88 fL (ref 79–97)
Monocytes Absolute: 0.4 10*3/uL (ref 0.1–0.9)
Monocytes: 5 %
Neutrophils Absolute: 5.5 10*3/uL (ref 1.4–7.0)
Neutrophils: 75 %
Platelets: 342 10*3/uL (ref 150–450)
RBC: 5.24 x10E6/uL (ref 3.77–5.28)
RDW: 12.6 % (ref 11.7–15.4)
Retic Ct Pct: 2.1 % (ref 0.6–2.6)
Total Iron Binding Capacity: 331 ug/dL (ref 250–450)
UIBC: 277 ug/dL (ref 131–425)
Vitamin B-12: 483 pg/mL (ref 232–1245)
WBC: 7.4 10*3/uL (ref 3.4–10.8)

## 2021-06-23 LAB — COMPREHENSIVE METABOLIC PANEL
ALT: 29 IU/L (ref 0–32)
AST: 18 IU/L (ref 0–40)
Albumin/Globulin Ratio: 1.8 (ref 1.2–2.2)
Albumin: 4.5 g/dL (ref 3.8–4.8)
Alkaline Phosphatase: 93 IU/L (ref 44–121)
BUN/Creatinine Ratio: 14 (ref 9–23)
BUN: 14 mg/dL (ref 6–24)
Bilirubin Total: 0.3 mg/dL (ref 0.0–1.2)
CO2: 23 mmol/L (ref 20–29)
Calcium: 9.8 mg/dL (ref 8.7–10.2)
Chloride: 99 mmol/L (ref 96–106)
Creatinine, Ser: 0.99 mg/dL (ref 0.57–1.00)
Globulin, Total: 2.5 g/dL (ref 1.5–4.5)
Glucose: 127 mg/dL — ABNORMAL HIGH (ref 65–99)
Potassium: 4.2 mmol/L (ref 3.5–5.2)
Sodium: 139 mmol/L (ref 134–144)
Total Protein: 7 g/dL (ref 6.0–8.5)
eGFR: 70 mL/min/{1.73_m2} (ref >59)

## 2021-06-23 LAB — THYROID PANEL WITH TSH
Free Thyroxine Index: 2 (ref 1.2–4.9)
T3 Uptake Ratio: 26 % (ref 24–39)
T4, Total: 7.5 ug/dL (ref 4.5–12.0)
TSH: 3.83 u[IU]/mL (ref 0.450–4.500)

## 2021-06-23 LAB — VITAMIN D 25 HYDROXY (VIT D DEFICIENCY, FRACTURES): Vit D, 25-Hydroxy: 30.6 ng/mL (ref 30.0–100.0)

## 2021-06-23 NOTE — Addendum Note (Signed)
Addended by: Larae Grooms on: 06/23/2021 09:28 AM   Modules accepted: Orders

## 2021-06-23 NOTE — Progress Notes (Signed)
Please also let patient know that her Vitamin D is within normal limits.

## 2021-06-23 NOTE — Progress Notes (Signed)
Please let patient know that overall her lab work looks good.  Her glucose has been elevated at recent visits. I would like her to come back and have her A1c checked to evaluate for diabetes. There is not evidence of anemia or thyroid problems.  I have placed the order for patient to have the additional blood work drawn.

## 2021-06-24 ENCOUNTER — Ambulatory Visit: Admitting: Nurse Practitioner

## 2021-06-30 ENCOUNTER — Other Ambulatory Visit

## 2021-07-11 ENCOUNTER — Ambulatory Visit: Payer: Self-pay | Admitting: *Deleted

## 2021-07-11 NOTE — Telephone Encounter (Signed)
Pt reports insect bite, unsure what type. Noted bite yesterday AM. States "Two holes an inch apart." Area on side of left breast,"A little underneath."  No itching, tender to touch. Red, extends 1 1/2 ins wide, 4 in in length. Pt afebrile, no drainage from site, states mild swelling. Marland Kitchen Appt made for 1000 tomorrow with Dr. Charlotta Newton. Care advise given. Pt verbalizes understanding.

## 2021-07-11 NOTE — Telephone Encounter (Signed)
Reason for Disposition  [1] Red or very tender (to touch) area AND [2] started over 24 hours after the bite  Answer Assessment - Initial Assessment Questions 1. TYPE of INSECT: "What type of insect was it?"      Unsure 2. ONSET: "When did you get bitten?"      Noted yesterday morning 3. LOCATION: "Where is the insect bite located?"      Left breast, side to underneath 4. REDNESS: "Is the area red or pink?" If Yes, ask: "What size is area of redness?" (inches or cm). "When did the redness start?"     Red,1 1/2 wide, 4 in length 5. PAIN: "Is there any pain?" If Yes, ask: "How bad is it?"  (Scale 1-10; or mild, moderate, severe)     3/10 6. ITCHING: "Does it itch?" If Yes, ask: "How bad is the itch?"    - MILD: doesn't interfere with normal activities   - MODERATE-SEVERE: interferes with work, school, sleep, or other activities      no 7. SWELLING: "How big is the swelling?" (inches, cm, or compare to coins)     mild 8. OTHER SYMPTOMS: "Do you have any other symptoms?"  (e.g., difficulty breathing, hives)     None  Protocols used: Insect Bite-A-AH

## 2021-07-12 ENCOUNTER — Other Ambulatory Visit: Payer: Self-pay

## 2021-07-12 ENCOUNTER — Encounter: Payer: Self-pay | Admitting: Internal Medicine

## 2021-07-12 ENCOUNTER — Ambulatory Visit (INDEPENDENT_AMBULATORY_CARE_PROVIDER_SITE_OTHER): Admitting: Internal Medicine

## 2021-07-12 VITALS — BP 115/72 | HR 78 | Ht 66.0 in | Wt 200.0 lb

## 2021-07-12 DIAGNOSIS — N611 Abscess of the breast and nipple: Secondary | ICD-10-CM | POA: Diagnosis not present

## 2021-07-12 IMAGING — CT CT HEAD W/O CM
3 series · 16 of 47 positions shown, 19 images · non-contrast
Comparison: 04/18/2017

CLINICAL DATA: Headache, possible stroke, history of stroke, facial
droop

EXAM:
CT HEAD WITHOUT CONTRAST
TECHNIQUE: Contiguous axial images were obtained from the base of the skull
through the vertex without intravenous contrast.

[Series 2: head wo · axial · 0.47mm/px · z∈[+381,+506]mm · 10 of 30 slices shown, 13 images]
[im 3/30  brain]
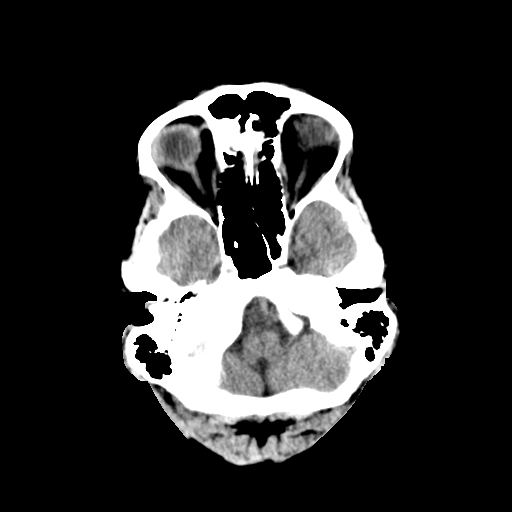
[im 3/30  bone]
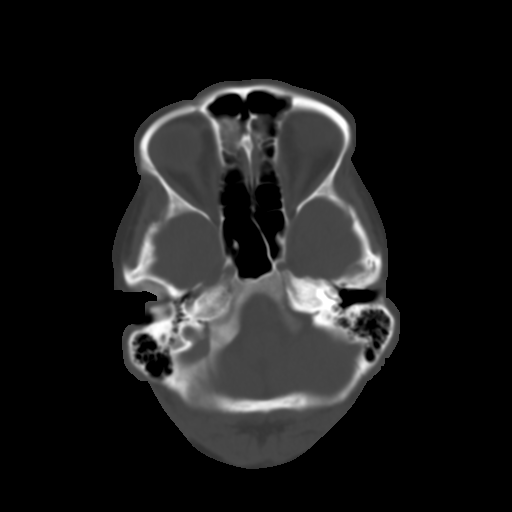
[im 6/30  brain]
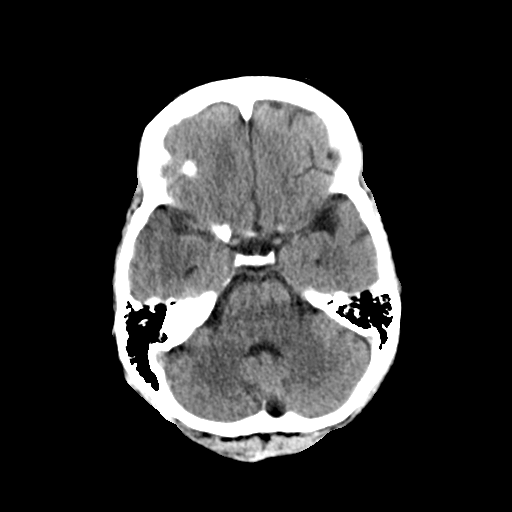
[im 9/30  brain]
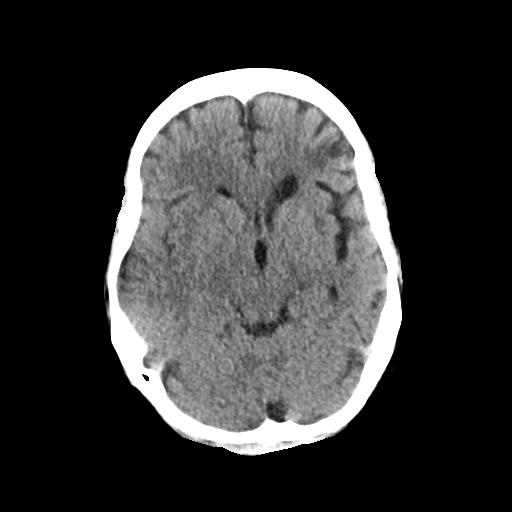
[im 11/30  brain]
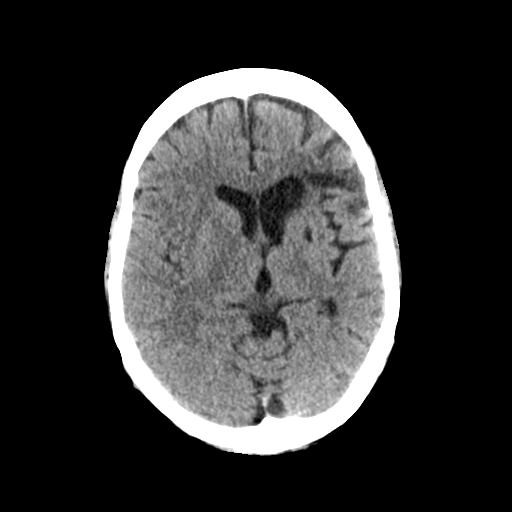
[im 14/30  brain]
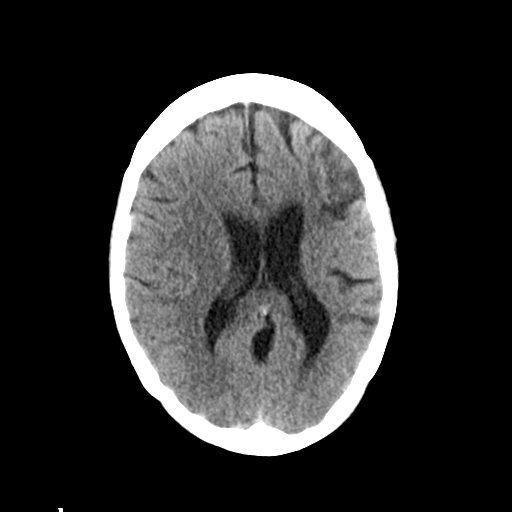
[im 14/30  bone]
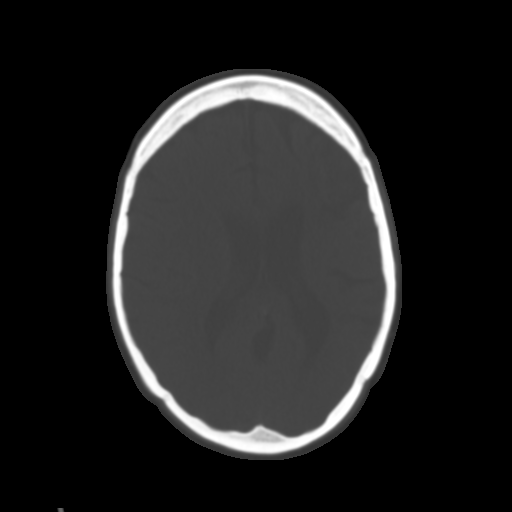
[im 17/30  brain]
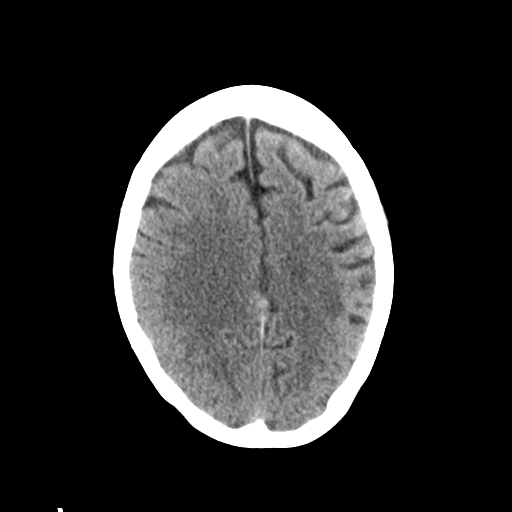
[im 20/30  brain]
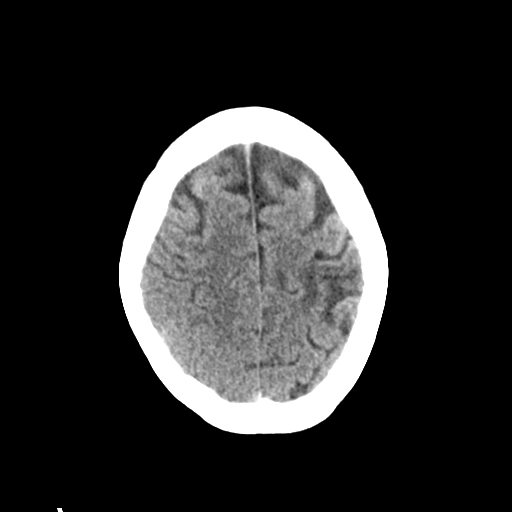
[im 23/30  brain]
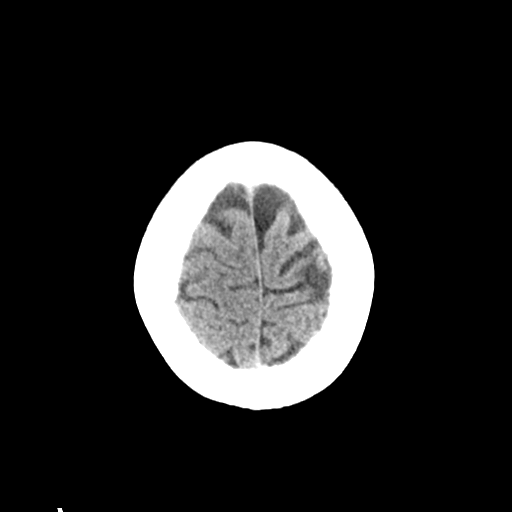
[im 25/30  brain]
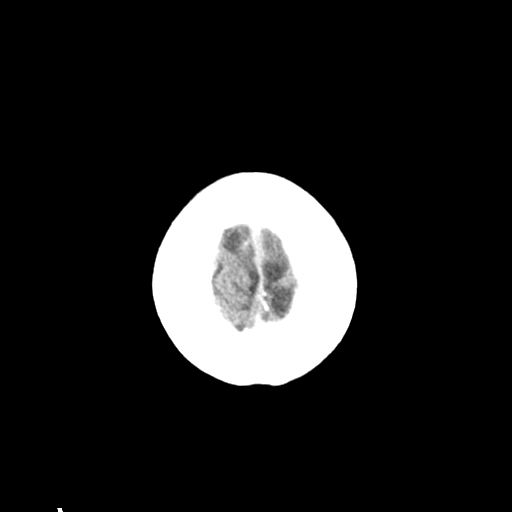
[im 25/30  bone]
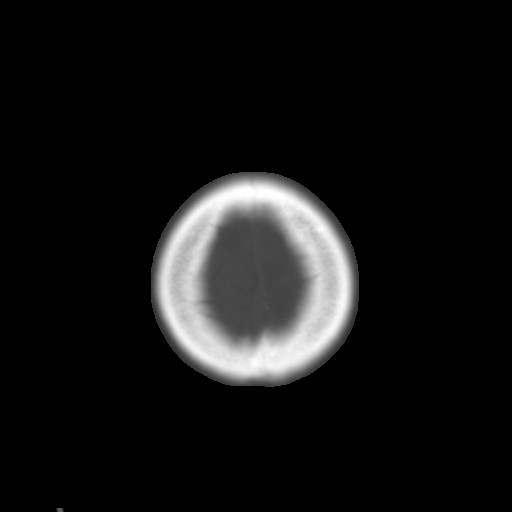
[im 28/30  brain]
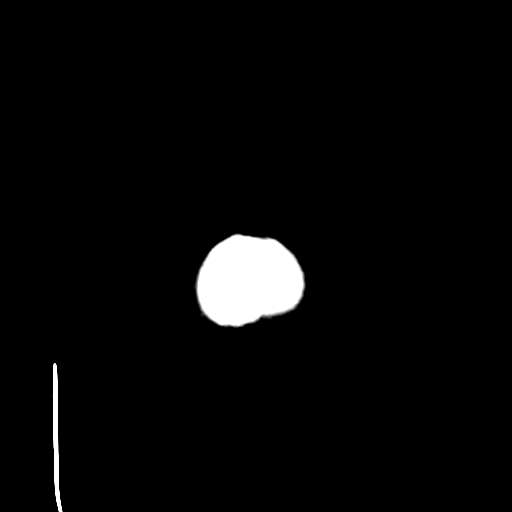

[Series 4: coronal soft tissue · coronal · 0.29mm/px · 3 of 64 slices shown]
[im 22/64  brain]
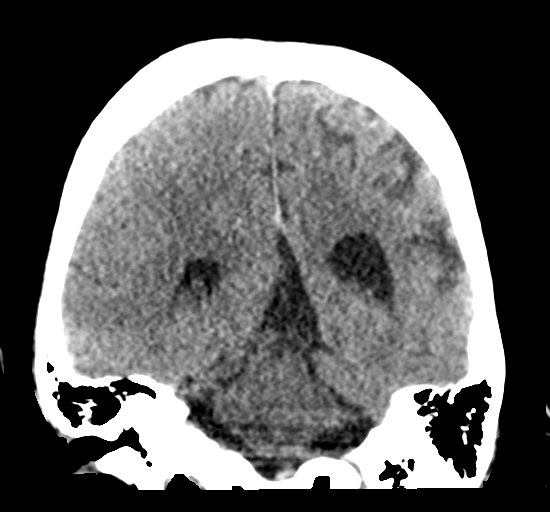
[im 29/64  brain]
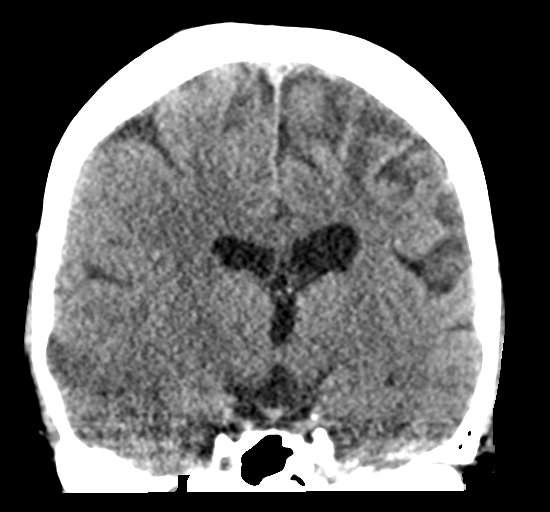
[im 36/64  brain]
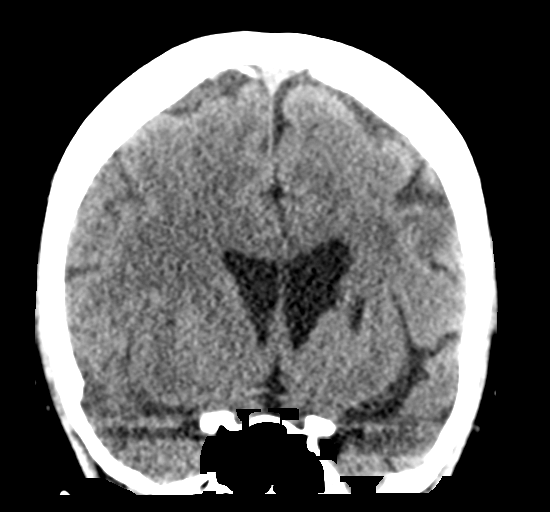

[Series 5: sagittal soft tissue · sagittal · 0.29mm/px · 3 of 50 slices shown]
[im 17/50  brain]
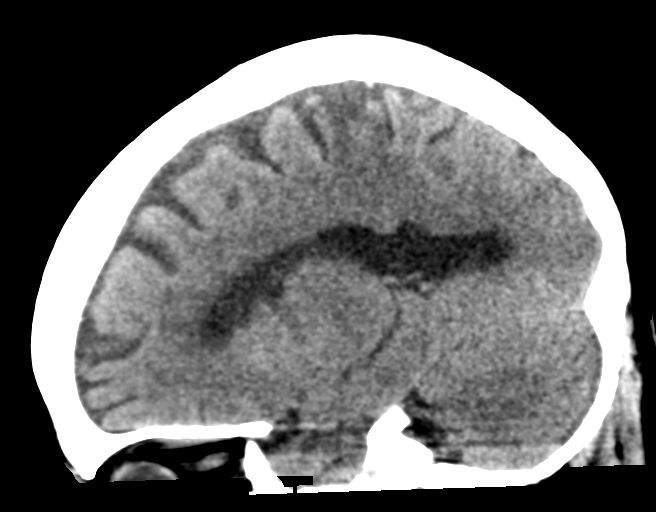
[im 25/50  brain]
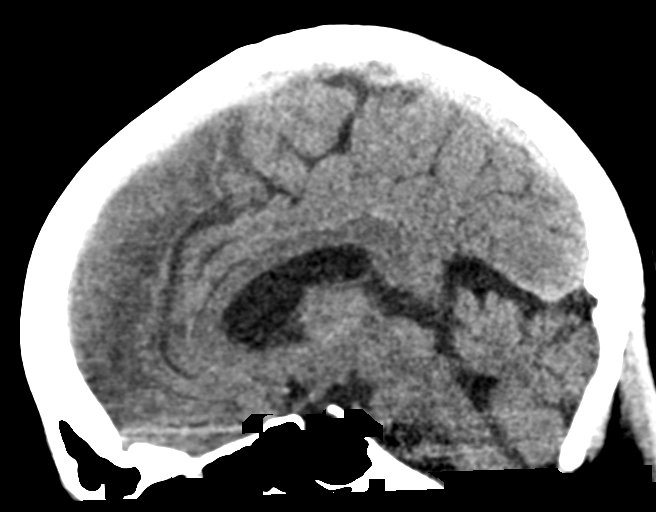
[im 33/50  brain]
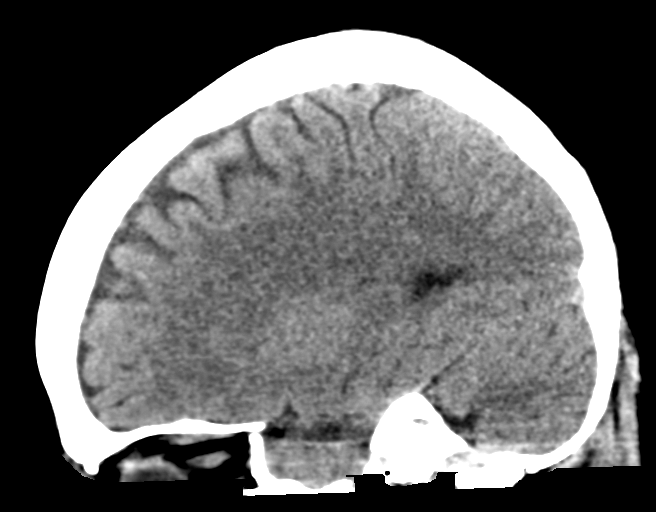

[16 of 47 positions shown; findings below may reference images not displayed]

FINDINGS: Brain: No evidence of acute infarction, hemorrhage, hydrocephalus,
extra-axial collection or mass lesion/mass effect. There is
redemonstrated asymmetry of the hemispheres with left frontal
encephalomalacia and volume loss.

Vascular: No hyperdense vessel or unexpected calcification.

Skull: Normal. Negative for fracture or focal lesion.

Sinuses/Orbits: No acute finding.

Other: None.
IMPRESSION: No acute intracranial pathology. No non-contrast CT findings to
explain headache. There is redemonstrated asymmetry of the
hemispheres with left frontal encephalomalacia and volume loss.

## 2021-07-12 MED ORDER — SULFAMETHOXAZOLE-TRIMETHOPRIM 800-160 MG PO TABS: 1.0000 | ORAL_TABLET | Freq: Two times a day (BID) | ORAL | 0 refills | Status: AC

## 2021-07-12 MED ORDER — MONTELUKAST SODIUM 10 MG PO TABS: 10.0000 mg | ORAL_TABLET | Freq: Every day | ORAL | 3 refills | Status: DC

## 2021-07-13 DIAGNOSIS — N611 Abscess of the breast and nipple: Secondary | ICD-10-CM | POA: Insufficient documentation

## 2021-07-13 NOTE — Progress Notes (Signed)
BP 115/72   Pulse 78   Ht $R'5\' 6"'Lx$  (1.676 m)   Wt 200 lb (90.7 kg)   SpO2 96%   BMI 32.28 kg/m    Subjective:    Patient ID: Latasha Lawrence, female    DOB: 1970-11-23, 50 y.o.   MRN: [id number]  Chief Complaint  Patient presents with  . Breast Pain    Left Breast Pain and redness. Possible bug bite? Said she see's 2 holes. Its very red, and she is concerned its infected. She noticed this yesterday morning.    HPI: Latasha Lawrence is a 50 y.o. female  Rash This is a new problem. The current episode started 1 to 4 weeks ago. The problem has been gradually worsening since onset. Location: left breast abscess noted with opneing and some watery discharge. The rash is characterized by draining. Pertinent negatives include no anorexia, congestion, cough, diarrhea, eye pain, facial edema, fatigue, fever, joint pain, nail changes, rhinorrhea, shortness of breath, sore throat or vomiting.   Chief Complaint  Patient presents with  . Breast Pain    Left Breast Pain and redness. Possible bug bite? Said she see's 2 holes. Its very red, and she is concerned its infected. She noticed this yesterday morning.    Relevant past medical, surgical, family and social history reviewed and updated as indicated. Interim medical history since our last visit reviewed. Allergies and medications reviewed and updated.  Review of Systems  Constitutional:  Negative for fatigue and fever.  HENT:  Negative for congestion, rhinorrhea and sore throat.   Eyes:  Negative for pain.  Respiratory:  Negative for cough and shortness of breath.   Gastrointestinal:  Negative for anorexia, diarrhea and vomiting.  Musculoskeletal:  Negative for joint pain.  Skin:  Positive for rash. Negative for nail changes.   Per HPI unless specifically indicated above     Objective:    BP 115/72   Pulse 78   Ht $R'5\' 6"'sI$  (1.676 m)   Wt 200 lb (90.7 kg)   SpO2 96%   BMI 32.28 kg/m   Wt Readings from Last 3 Encounters:  07/12/21  200 lb (90.7 kg)  06/22/21 207 lb 6.4 oz (94.1 kg)  06/04/21 200 lb (90.7 kg)    Physical Exam Vitals and nursing note reviewed.  Constitutional:      General: She is not in acute distress.    Appearance: Normal appearance. She is not ill-appearing or diaphoretic.  Pulmonary:     Breath sounds: No rhonchi.  Abdominal:     Palpations: There is no mass.  Skin:    General: Skin is warm and dry.     Coloration: Skin is not jaundiced.     Findings: Lesion present. No erythema or rash.     Comments: Left breast abcess note don the lower outer quadrant of such   Neurological:     Mental Status: She is alert.   Results for orders placed or performed in visit on 06/22/21  Comp Met (CMET)  Result Value Ref Range   Glucose 127 (H) 65 - 99 mg/dL   BUN 14 6 - 24 mg/dL   Creatinine, Ser 0.99 0.57 - 1.00 mg/dL   eGFR 70 >59 mL/min/1.73   BUN/Creatinine Ratio 14 9 - 23   Sodium 139 134 - 144 mmol/L   Potassium 4.2 3.5 - 5.2 mmol/L   Chloride 99 96 - 106 mmol/L   CO2 23 20 - 29 mmol/L   Calcium 9.8 8.7 -  10.2 mg/dL   Total Protein 7.0 6.0 - 8.5 g/dL   Albumin 4.5 3.8 - 4.8 g/dL   Globulin, Total 2.5 1.5 - 4.5 g/dL   Albumin/Globulin Ratio 1.8 1.2 - 2.2   Bilirubin Total 0.3 0.0 - 1.2 mg/dL   Alkaline Phosphatase 93 44 - 121 IU/L   AST 18 0 - 40 IU/L   ALT 29 0 - 32 IU/L  Thyroid Panel With TSH  Result Value Ref Range   TSH 3.830 0.450 - 4.500 uIU/mL   T4, Total 7.5 4.5 - 12.0 ug/dL   T3 Uptake Ratio 26 24 - 39 %   Free Thyroxine Index 2.0 1.2 - 4.9  Vitamin D (25 hydroxy)  Result Value Ref Range   Vit D, 25-Hydroxy 30.6 30.0 - 100.0 ng/mL  Anemia Profile B  Result Value Ref Range   Total Iron Binding Capacity 331 250 - 450 ug/dL   UIBC 277 131 - 425 ug/dL   Iron 54 27 - 159 ug/dL   Iron Saturation 16 15 - 55 %   Ferritin 40 15 - 150 ng/mL   Vitamin B-12 483 232 - 1,245 pg/mL   Folate 6.1 >3.0 ng/mL   WBC 7.4 3.4 - 10.8 x10E3/uL   RBC 5.24 3.77 - 5.28 x10E6/uL   Hemoglobin  15.4 11.1 - 15.9 g/dL   Hematocrit 46.2 34.0 - 46.6 %   MCV 88 79 - 97 fL   MCH 29.4 26.6 - 33.0 pg   MCHC 33.3 31.5 - 35.7 g/dL   RDW 12.6 11.7 - 15.4 %   Platelets 342 150 - 450 x10E3/uL   Neutrophils 75 Not Estab. %   Lymphs 18 Not Estab. %   Monocytes 5 Not Estab. %   Eos 2 Not Estab. %   Basos 0 Not Estab. %   Neutrophils Absolute 5.5 1.4 - 7.0 x10E3/uL   Lymphocytes Absolute 1.3 0.7 - 3.1 x10E3/uL   Monocytes Absolute 0.4 0.1 - 0.9 x10E3/uL   EOS (ABSOLUTE) 0.1 0.0 - 0.4 x10E3/uL   Basophils Absolute 0.0 0.0 - 0.2 x10E3/uL   Immature Granulocytes 0 Not Estab. %   Immature Grans (Abs) 0.0 0.0 - 0.1 x10E3/uL   Retic Ct Pct 2.1 0.6 - 2.6 %        Current Outpatient Medications:  .  ALPRAZolam (XANAX) 0.5 MG tablet, Take 1 tablet (0.5 mg total) by mouth at bedtime as needed for anxiety., Disp: 30 tablet, Rfl: 0 .  atorvastatin (LIPITOR) 80 MG tablet, Take 1 tablet (80 mg total) by mouth daily at 6 PM., Disp: 90 tablet, Rfl: 1 .  butalbital-acetaminophen-caffeine (FIORICET) 50-325-40 MG tablet, Take 1-2 tablets by mouth every 6 (six) hours as needed for headache., Disp: 20 tablet, Rfl: 0 .  fluticasone (FLONASE) 50 MCG/ACT nasal spray, Place 1 spray into both nostrils in the morning and at bedtime., Disp: 16 g, Rfl: 5 .  hydrochlorothiazide (HYDRODIURIL) 25 MG tablet, Take 1 tablet (25 mg total) by mouth daily., Disp: 90 tablet, Rfl: 1 .  hydrOXYzine (VISTARIL) 100 MG capsule, Take 1 capsule (100 mg total) by mouth 3 (three) times daily as needed., Disp: 90 capsule, Rfl: 1 .  losartan (COZAAR) 50 MG tablet, Take 1 tablet (50 mg total) by mouth daily., Disp: 90 tablet, Rfl: 1 .  ondansetron (ZOFRAN ODT) 4 MG disintegrating tablet, Take 1 tablet (4 mg total) by mouth every 8 (eight) hours as needed for nausea or vomiting., Disp: 20 tablet, Rfl: 0 .  pantoprazole (PROTONIX)  40 MG tablet, Take 1 tablet (40 mg total) by mouth daily., Disp: 90 tablet, Rfl: 1 .  spironolactone (ALDACTONE)  25 MG tablet, Take 1 tablet (25 mg total) by mouth daily., Disp: 90 tablet, Rfl: 1 .  sulfamethoxazole-trimethoprim (BACTRIM DS) 800-160 MG tablet, Take 1 tablet by mouth 2 (two) times daily for 7 days., Disp: 14 tablet, Rfl: 0 .  traZODone (DESYREL) 100 MG tablet, Take 2 tablets (200 mg total) by mouth at bedtime as needed for sleep., Disp: 180 tablet, Rfl: 1 .  venlafaxine XR (EFFEXOR XR) 150 MG 24 hr capsule, Take 1 capsule (150 mg total) by mouth daily with breakfast., Disp: 60 capsule, Rfl: 0 .  montelukast (SINGULAIR) 10 MG tablet, Take 1 tablet (10 mg total) by mouth at bedtime., Disp: 30 tablet, Rfl: 3    Assessment & Plan:  Breast abscess we will start patient on Bactrim. She will need to address this wound and use topical antibiotics. Might need work-up for diabetes, Unsure if last couple glucose levels were fasting  Follow-up with PCP Problem List Items Addressed This Visit   None    No orders of the defined types were placed in this encounter.    Meds ordered this encounter  Medications  . montelukast (SINGULAIR) 10 MG tablet    Sig: Take 1 tablet (10 mg total) by mouth at bedtime.    Dispense:  30 tablet    Refill:  3  . sulfamethoxazole-trimethoprim (BACTRIM DS) 800-160 MG tablet    Sig: Take 1 tablet by mouth 2 (two) times daily for 7 days.    Dispense:  14 tablet    Refill:  0     Follow up plan: No follow-ups on file.

## 2021-08-06 ENCOUNTER — Other Ambulatory Visit: Payer: Self-pay | Admitting: Nurse Practitioner

## 2021-08-06 DIAGNOSIS — K219 Gastro-esophageal reflux disease without esophagitis: Secondary | ICD-10-CM

## 2021-08-06 NOTE — Telephone Encounter (Signed)
Requested Prescriptions  Pending Prescriptions Disp Refills  . venlafaxine XR (EFFEXOR-XR) 150 MG 24 hr capsule [Pharmacy Med Name: VENLAFAXINE HCL ER 150 MG CAP] 60 capsule 0    Sig: Take 1 capsule (150 mg total) by mouth daily with breakfast.     Psychiatry: Antidepressants - SNRI - desvenlafaxine & venlafaxine Passed - 08/06/2021  1:34 PM      Passed - LDL in normal range and within 360 days    Ldl Cholesterol, Calc  Date Value Ref Range Status  01/19/2014 167 (H) 0 - 100 mg/dL Final   LDL Chol Calc (NIH)  Date Value Ref Range Status  01/31/2021 59 0 - 99 mg/dL Final         Passed - Total Cholesterol in normal range and within 360 days    Cholesterol, Total  Date Value Ref Range Status  01/31/2021 121 100 - 199 mg/dL Final   Cholesterol  Date Value Ref Range Status  01/19/2014 268 (H) 0 - 200 mg/dL Final         Passed - Triglycerides in normal range and within 360 days    Triglycerides  Date Value Ref Range Status  01/31/2021 104 0 - 149 mg/dL Final  52/77/8242 353 (H) 0 - 200 mg/dL Final         Passed - Completed PHQ-2 or PHQ-9 in the last 360 days      Passed - Last BP in normal range    BP Readings from Last 1 Encounters:  07/12/21 115/72         Passed - Valid encounter within last 6 months    Recent Outpatient Visits          3 weeks ago Breast abscess   [facility] Vigg, Avanti, MD   1 month ago Anxiety and depression   [facility] Larae Grooms, NP   2 months ago Insect bite of right lower leg, initial encounter   [facility] Larae Grooms, NP   3 months ago Arthralgia, unspecified joint   [facility] Larae Grooms, NP   5 months ago Smoking   [facility] Larae Grooms, NP      Future Appointments            In 2 weeks Larae Grooms, NP [facility]           . ondansetron (ZOFRAN-ODT) 4 MG disintegrating tablet [Pharmacy Med Name:  ONDANSETRON ODT 4 MG TABLET] 20 tablet 0    Sig: Take 1 tablet (4 mg total) by mouth every 8 (eight) hours as needed fornausea or vomiting.     Not Delegated - Gastroenterology: Antiemetics Failed - 08/06/2021  1:34 PM      Failed - This refill cannot be delegated      Passed - Valid encounter within last 6 months    Recent Outpatient Visits          3 weeks ago Breast abscess   [facility] Vigg, Avanti, MD   1 month ago Anxiety and depression   [facility] Larae Grooms, NP   2 months ago Insect bite of right lower leg, initial encounter   [facility] Larae Grooms, NP   3 months ago Arthralgia, unspecified joint   [facility] Larae Grooms, NP   5 months ago Smoking   [facility] Larae Grooms, NP      Future Appointments  In 2 weeks Larae Grooms, NP [facility]           . traZODone (DESYREL) 100 MG tablet [Pharmacy Med Name: TRAZODONE 100 MG TABLET] 180 tablet     Sig: Take 2 tablets (200 mg total) by mouth at bedtime as needed for sleep.     Psychiatry: Antidepressants - Serotonin Modulator Passed - 08/06/2021  1:34 PM      Passed - Completed PHQ-2 or PHQ-9 in the last 360 days      Passed - Valid encounter within last 6 months    Recent Outpatient Visits          3 weeks ago Breast abscess   [facility] Vigg, Avanti, MD   1 month ago Anxiety and depression   [facility] Larae Grooms, NP   2 months ago Insect bite of right lower leg, initial encounter   [facility] Larae Grooms, NP   3 months ago Arthralgia, unspecified joint   [facility] Larae Grooms, NP   5 months ago Smoking   [facility] Larae Grooms, NP      Future Appointments            In 2 weeks Larae Grooms, NP [facility]           . hydrochlorothiazide (HYDRODIURIL) 25 MG  tablet [Pharmacy Med Name: HYDROCHLOROTHIAZIDE 25 MG TAB] 90 tablet 0    Sig: Take 1 tablet (25 mg total) by mouth daily.     Cardiovascular: Diuretics - Thiazide Passed - 08/06/2021  1:34 PM      Passed - Ca in normal range and within 360 days    Calcium  Date Value Ref Range Status  06/22/2021 9.8 8.7 - 10.2 mg/dL Final   Calcium, Total  Date Value Ref Range Status  09/06/2014 9.4 8.5 - 10.1 mg/dL Final         Passed - Cr in normal range and within 360 days    Creatinine  Date Value Ref Range Status  09/06/2014 0.97 0.60 - 1.30 mg/dL Final   Creatinine, Ser  Date Value Ref Range Status  06/22/2021 0.99 0.57 - 1.00 mg/dL Final         Passed - K in normal range and within 360 days    Potassium  Date Value Ref Range Status  06/22/2021 4.2 3.5 - 5.2 mmol/L Final  09/06/2014 3.8 3.5 - 5.1 mmol/L Final         Passed - Na in normal range and within 360 days    Sodium  Date Value Ref Range Status  06/22/2021 139 134 - 144 mmol/L Final  09/06/2014 138 136 - 145 mmol/L Final         Passed - Last BP in normal range    BP Readings from Last 1 Encounters:  07/12/21 115/72         Passed - Valid encounter within last 6 months    Recent Outpatient Visits          3 weeks ago Breast abscess   [facility] Vigg, Avanti, MD   1 month ago Anxiety and depression   [facility] Larae Grooms, NP   2 months ago Insect bite of right lower leg, initial encounter   [facility] Larae Grooms, NP   3 months ago Arthralgia, unspecified joint   [facility] Larae Grooms, NP   5 months ago Smoking  [facility] Larae Grooms, NP      Future Appointments            In 2 weeks Larae Grooms, NP [facility]           . hydrOXYzine (VISTARIL) 100 MG capsule [Pharmacy Med Name: HYDROXYZINE PAM 100 MG CAP] 90 capsule 0    Sig: Take 1 capsule (100 mg total) by mouth 3 (three)  times daily as needed.     Ear, Nose, and Throat:  Antihistamines Passed - 08/06/2021  1:34 PM      Passed - Valid encounter within last 12 months    Recent Outpatient Visits          3 weeks ago Breast abscess   [facility] Vigg, Avanti, MD   1 month ago Anxiety and depression   [facility] Larae Grooms, NP   2 months ago Insect bite of right lower leg, initial encounter   [facility] Larae Grooms, NP   3 months ago Arthralgia, unspecified joint   [facility] Larae Grooms, NP   5 months ago Smoking   [facility] Larae Grooms, NP      Future Appointments            In 2 weeks Larae Grooms, NP [facility]           . losartan (COZAAR) 50 MG tablet [Pharmacy Med Name: LOSARTAN POTASSIUM 50 MG TAB] 90 tablet 0    Sig: Take 1 tablet (50 mg total) by mouth daily.     Cardiovascular:  Angiotensin Receptor Blockers Passed - 08/06/2021  1:34 PM      Passed - Cr in normal range and within 180 days    Creatinine  Date Value Ref Range Status  09/06/2014 0.97 0.60 - 1.30 mg/dL Final   Creatinine, Ser  Date Value Ref Range Status  06/22/2021 0.99 0.57 - 1.00 mg/dL Final         Passed - K in normal range and within 180 days    Potassium  Date Value Ref Range Status  06/22/2021 4.2 3.5 - 5.2 mmol/L Final  09/06/2014 3.8 3.5 - 5.1 mmol/L Final         Passed - Patient is not pregnant      Passed - Last BP in normal range    BP Readings from Last 1 Encounters:  07/12/21 115/72         Passed - Valid encounter within last 6 months    Recent Outpatient Visits          3 weeks ago Breast abscess   [facility] Vigg, Avanti, MD   1 month ago Anxiety and depression   [facility] Larae Grooms, NP   2 months ago Insect bite of right lower leg, initial encounter   [facility] Larae Grooms, NP   3 months ago Arthralgia,  unspecified joint   [facility] Larae Grooms, NP   5 months ago Smoking   [facility] Larae Grooms, NP      Future Appointments            In 2 weeks Larae Grooms, NP [facility]           . pantoprazole (PROTONIX) 40 MG tablet [Pharmacy Med Name: PANTOPRAZOLE SOD DR 40 MG TAB] 90 tablet 0    Sig: Take 1 tablet (40 mg total) by mouth  daily.     Gastroenterology: Proton Pump Inhibitors Passed - 08/06/2021  1:34 PM      Passed - Valid encounter within last 12 months    Recent Outpatient Visits          3 weeks ago Breast abscess   [facility] Vigg, Avanti, MD   1 month ago Anxiety and depression   [facility] Larae Grooms, NP   2 months ago Insect bite of right lower leg, initial encounter   [facility] Larae Grooms, NP   3 months ago Arthralgia, unspecified joint   [facility] Larae Grooms, NP   5 months ago Smoking   [facility] Larae Grooms, NP      Future Appointments            In 2 weeks Larae Grooms, NP [facility]

## 2021-08-06 NOTE — Telephone Encounter (Signed)
Requested medication (s) are due for refill today: yes  Requested medication (s) are on the active medication list: yes  Last refill:  04/26/21 #20  Future visit scheduled: yes  Notes to clinic:  med not delegated to NT to RF   Requested Prescriptions  Pending Prescriptions Disp Refills   venlafaxine XR (EFFEXOR-XR) 150 MG 24 hr capsule [Pharmacy Med Name: VENLAFAXINE HCL ER 150 MG CAP] 60 capsule 0    Sig: Take 1 capsule (150 mg total) by mouth daily with breakfast.     Psychiatry: Antidepressants - SNRI - desvenlafaxine & venlafaxine Passed - 08/06/2021  1:34 PM      Passed - LDL in normal range and within 360 days    Ldl Cholesterol, Calc  Date Value Ref Range Status  01/19/2014 167 (H) 0 - 100 mg/dL Final   LDL Chol Calc (NIH)  Date Value Ref Range Status  01/31/2021 59 0 - 99 mg/dL Final          Passed - Total Cholesterol in normal range and within 360 days    Cholesterol, Total  Date Value Ref Range Status  01/31/2021 121 100 - 199 mg/dL Final   Cholesterol  Date Value Ref Range Status  01/19/2014 268 (H) 0 - 200 mg/dL Final          Passed - Triglycerides in normal range and within 360 days    Triglycerides  Date Value Ref Range Status  01/31/2021 104 0 - 149 mg/dL Final  47/42/5956 387 (H) 0 - 200 mg/dL Final          Passed - Completed PHQ-2 or PHQ-9 in the last 360 days      Passed - Last BP in normal range    BP Readings from Last 1 Encounters:  07/12/21 115/72          Passed - Valid encounter within last 6 months    Recent Outpatient Visits           3 weeks ago Breast abscess   [facility] Vigg, Avanti, MD   1 month ago Anxiety and depression   [facility] Larae Grooms, NP   2 months ago Insect bite of right lower leg, initial encounter   [facility] Larae Grooms, NP   3 months ago Arthralgia, unspecified joint   [facility], Clydie Braun, NP   5 months ago Smoking    [facility] Larae Grooms, NP       Future Appointments             In 2 weeks Larae Grooms, NP [facility]             ondansetron (ZOFRAN-ODT) 4 MG disintegrating tablet [Pharmacy Med Name: ONDANSETRON ODT 4 MG TABLET] 20 tablet     Sig: Take 1 tablet (4 mg total) by mouth every 8 (eight) hours as needed fornausea or vomiting.     Not Delegated - Gastroenterology: Antiemetics Failed - 08/06/2021  1:34 PM      Failed - This refill cannot be delegated      Passed - Valid encounter within last 6 months    Recent Outpatient Visits           3 weeks ago Breast abscess   [facility] Vigg, Avanti, MD   1 month ago Anxiety and depression   [facility], Clydie Braun, NP   2 months ago Insect bite of right lower leg,  initial encounter   [facility] Larae Grooms, NP   3 months ago Arthralgia, unspecified joint   [facility] Larae Grooms, NP   5 months ago Smoking   [facility] Larae Grooms, NP       Future Appointments             In 2 weeks Larae Grooms, NP [facility]            Signed Prescriptions Disp Refills   traZODone (DESYREL) 100 MG tablet 180 tablet 0    Sig: Take 2 tablets (200 mg total) by mouth at bedtime as needed for sleep.     Psychiatry: Antidepressants - Serotonin Modulator Passed - 08/06/2021  1:34 PM      Passed - Completed PHQ-2 or PHQ-9 in the last 360 days      Passed - Valid encounter within last 6 months    Recent Outpatient Visits           3 weeks ago Breast abscess   [facility] Vigg, Avanti, MD   1 month ago Anxiety and depression   [facility] Larae Grooms, NP   2 months ago Insect bite of right lower leg, initial encounter   [facility] Larae Grooms, NP   3 months ago Arthralgia, unspecified joint   [facility] Larae Grooms, NP   5 months ago Smoking   [facility] Larae Grooms, NP       Future Appointments             In 2 weeks Larae Grooms, NP [facility]             hydrochlorothiazide (HYDRODIURIL) 25 MG tablet 90 tablet 0    Sig: Take 1 tablet (25 mg total) by mouth daily.     Cardiovascular: Diuretics - Thiazide Passed - 08/06/2021  1:34 PM      Passed - Ca in normal range and within 360 days    Calcium  Date Value Ref Range Status  06/22/2021 9.8 8.7 - 10.2 mg/dL Final   Calcium, Total  Date Value Ref Range Status  09/06/2014 9.4 8.5 - 10.1 mg/dL Final          Passed - Cr in normal range and within 360 days    Creatinine  Date Value Ref Range Status  09/06/2014 0.97 0.60 - 1.30 mg/dL Final   Creatinine, Ser  Date Value Ref Range Status  06/22/2021 0.99 0.57 - 1.00 mg/dL Final          Passed - K in normal range and within 360 days    Potassium  Date Value Ref Range Status  06/22/2021 4.2 3.5 - 5.2 mmol/L Final  09/06/2014 3.8 3.5 - 5.1 mmol/L Final          Passed - Na in normal range and within 360 days    Sodium  Date Value Ref Range Status  06/22/2021 139 134 - 144 mmol/L Final  09/06/2014 138 136 - 145 mmol/L Final          Passed - Last BP in normal range    BP Readings from Last 1 Encounters:  07/12/21 115/72          Passed - Valid encounter within last 6 months    Recent Outpatient Visits           3 weeks ago Breast abscess   [facility] Vigg, Avanti,  MD   1 month ago Anxiety and depression   [facility] Larae Grooms, NP   2 months ago Insect bite of right lower leg, initial encounter   [facility] Larae Grooms, NP   3 months ago Arthralgia, unspecified joint   [facility] Larae Grooms, NP   5 months ago Smoking   [facility] Larae Grooms, NP       Future Appointments             In 2 weeks Larae Grooms, NP [facility]             hydrOXYzine (VISTARIL) 100 MG capsule 90 capsule 0    Sig: Take 1 capsule (100 mg total) by mouth 3 (three) times daily as needed.     Ear, Nose, and Throat:  Antihistamines Passed - 08/06/2021  1:34 PM      Passed - Valid encounter within last 12 months    Recent Outpatient Visits           3 weeks ago Breast abscess   [facility] Vigg, Avanti, MD   1 month ago Anxiety and depression   [facility] Larae Grooms, NP   2 months ago Insect bite of right lower leg, initial encounter   [facility], [facility]   3 months ago Arthralgia, unspecified joint   [facility] Larae Grooms, NP   5 months ago Smoking   [facility] Larae Grooms, NP       Future Appointments             In 2 weeks Larae Grooms, NP [facility]             losartan (COZAAR) 50 MG tablet 90 tablet 0    Sig: Take 1 tablet (50 mg total) by mouth daily.     Cardiovascular:  Angiotensin Receptor Blockers Passed - 08/06/2021  1:34 PM      Passed - Cr in normal range and within 180 days    Creatinine  Date Value Ref Range Status  09/06/2014 0.97 0.60 - 1.30 mg/dL Final   Creatinine, Ser  Date Value Ref Range Status  06/22/2021 0.99 0.57 - 1.00 mg/dL Final          Passed - K in normal range and within 180 days    Potassium  Date Value Ref Range Status  06/22/2021 4.2 3.5 - 5.2 mmol/L Final  09/06/2014 3.8 3.5 - 5.1 mmol/L Final          Passed - Patient is not pregnant      Passed - Last BP in normal range    BP Readings from Last 1 Encounters:  07/12/21 115/72          Passed - Valid encounter within last 6 months    Recent Outpatient Visits           3 weeks ago Breast abscess   [facility] Vigg, Avanti, MD   1 month ago Anxiety and depression   [facility] Larae Grooms, NP   2 months  ago Insect bite of right lower leg, initial encounter   [facility] Larae Grooms, NP   3 months ago Arthralgia, unspecified joint   [facility] Larae Grooms, NP   5 months ago Smoking   [facility] Larae Grooms, NP       Future Appointments  In 2 weeks Larae Grooms, NP [facility]             pantoprazole (PROTONIX) 40 MG tablet 90 tablet 0    Sig: Take 1 tablet (40 mg total) by mouth daily.     Gastroenterology: Proton Pump Inhibitors Passed - 08/06/2021  1:34 PM      Passed - Valid encounter within last 12 months    Recent Outpatient Visits           3 weeks ago Breast abscess   [facility] Vigg, Avanti, MD   1 month ago Anxiety and depression   [facility] Larae Grooms, NP   2 months ago Insect bite of right lower leg, initial encounter   [facility] Larae Grooms, NP   3 months ago Arthralgia, unspecified joint   [facility] Larae Grooms, NP   5 months ago Smoking   [facility] Larae Grooms, NP       Future Appointments             In 2 weeks Larae Grooms, NP [facility], [facility]

## 2021-08-25 ENCOUNTER — Encounter: Payer: Self-pay | Admitting: Nurse Practitioner

## 2021-08-25 ENCOUNTER — Ambulatory Visit (INDEPENDENT_AMBULATORY_CARE_PROVIDER_SITE_OTHER): Admitting: Nurse Practitioner

## 2021-08-25 ENCOUNTER — Other Ambulatory Visit: Payer: Self-pay

## 2021-08-25 VITALS — Temp 98.4°F | Wt 207.4 lb

## 2021-08-25 DIAGNOSIS — F419 Anxiety disorder, unspecified: Secondary | ICD-10-CM | POA: Diagnosis not present

## 2021-08-25 DIAGNOSIS — N760 Acute vaginitis: Secondary | ICD-10-CM

## 2021-08-25 DIAGNOSIS — N3001 Acute cystitis with hematuria: Secondary | ICD-10-CM

## 2021-08-25 DIAGNOSIS — R300 Dysuria: Secondary | ICD-10-CM

## 2021-08-25 DIAGNOSIS — B9689 Other specified bacterial agents as the cause of diseases classified elsewhere: Secondary | ICD-10-CM

## 2021-08-25 DIAGNOSIS — F32A Depression, unspecified: Secondary | ICD-10-CM

## 2021-08-25 LAB — WET PREP FOR TRICH, YEAST, CLUE
Clue Cell Exam: POSITIVE — AB
Trichomonas Exam: NEGATIVE
Yeast Exam: NEGATIVE

## 2021-08-25 LAB — URINALYSIS, ROUTINE W REFLEX MICROSCOPIC
Bilirubin, UA: NEGATIVE
Glucose, UA: NEGATIVE
Ketones, UA: NEGATIVE
Leukocytes,UA: NEGATIVE
Nitrite, UA: NEGATIVE
Protein,UA: NEGATIVE
Specific Gravity, UA: 1.015 (ref 1.005–1.030)
Urobilinogen, Ur: 0.2 mg/dL (ref 0.2–1.0)
pH, UA: 6 (ref 5.0–7.5)

## 2021-08-25 LAB — MICROSCOPIC EXAMINATION: WBC, UA: NONE SEEN /hpf (ref 0–5)

## 2021-08-25 MED ORDER — NITROFURANTOIN MONOHYD MACRO 100 MG PO CAPS
100.0000 mg | ORAL_CAPSULE | Freq: Two times a day (BID) | ORAL | 0 refills | Status: DC
Start: 2021-08-25 — End: 2021-09-22

## 2021-08-25 MED ORDER — VENLAFAXINE HCL ER 150 MG PO CP24: 150.0000 mg | ORAL_CAPSULE | Freq: Every day | ORAL | 1 refills | Status: DC

## 2021-08-25 MED ORDER — METRONIDAZOLE 0.75 % VA GEL: 1.0000 | Freq: Two times a day (BID) | VAGINAL | 0 refills | Status: AC

## 2021-08-25 NOTE — Progress Notes (Signed)
Temp 98.4 F (36.9 C) (Oral)   Wt 207 lb 6.4 oz (94.1 kg)   SpO2 95%   BMI 33.48 kg/m    Subjective:    Patient ID: Latasha Lawrence, female    DOB: 08-Dec-1970, 50 y.o.   MRN: [id number]  HPI: Latasha Lawrence is a 50 y.o. female  Chief Complaint  Patient presents with  . Anxiety  . Depression  . Dysuria    Patient states she has been having urinary frequency, pain in lower back near kidney area and dysuria. Patient states she became symptomatic last week. Patient states her pain level is about a 7.     DEPRESSION/ANXIETY Patient states she has been taking the Effexor.  She states the depression is better but the anxiety is up and down.  States she does have some panic attacks but takes benadryl for them.    GAD 7 : Generalized Anxiety Score 08/25/2021 07/12/2021 06/22/2021 04/14/2021  Nervous, Anxious, on Edge 3 1 1 2   Control/stop worrying 3 2 2 3   Worry too much - different things 3 2 2 2   Trouble relaxing 3 3 3 3   Restless 2 1 1 1   Easily annoyed or irritable 2 3 3 1   Afraid - awful might happen 1 1 1 1   Total GAD 7 Score 17 13 13 13   Anxiety Difficulty Very difficult Very difficult Extremely difficult Very difficult    [place] Office Visit from 08/25/2021 in [place]  PHQ-9 Total Score 16       URINARY SYMPTOMS- started about a week ago. Dysuria: yes Urinary frequency: yes Urgency: yes Small volume voids: yes Symptom severity: no Urinary incontinence: no Foul odor: no Hematuria: no Abdominal pain: yes Back pain: yes Suprapubic pain/pressure: no Flank pain: yes Fever:  no Vomiting: no Relief with cranberry juice: no Relief with pyridium: no Status: worse Previous urinary tract infection: no Recurrent urinary tract infection: no History of sexually transmitted disease: no Penile discharge: no Treatments attempted: none     Relevant past medical, surgical, family and social history reviewed and updated as indicated. Interim medical  history since our last visit reviewed. Allergies and medications reviewed and updated.  Review of Systems  Constitutional:  Negative for fever.  Gastrointestinal:  Positive for abdominal pain. Negative for vomiting.  Genitourinary:  Positive for decreased urine volume, dysuria, flank pain, frequency and urgency. Negative for hematuria.  Musculoskeletal:  Positive for back pain.       Knee, hip and ankle pain  Psychiatric/Behavioral:  Positive for dysphoric mood. Negative for suicidal ideas. The patient is nervous/anxious.    Per HPI unless specifically indicated above     Objective:    Temp 98.4 F (36.9 C) (Oral)   Wt 207 lb 6.4 oz (94.1 kg)   SpO2 95%   BMI 33.48 kg/m   Wt Readings from Last 3 Encounters:  08/25/21 207 lb 6.4 oz (94.1 kg)  07/12/21 200 lb (90.7 kg)  06/22/21 207 lb 6.4 oz (94.1 kg)    Physical Exam Vitals and nursing note reviewed.  Constitutional:      General: She is not in acute distress.    Appearance: Normal appearance. She is normal weight. She is not ill-appearing, toxic-appearing or diaphoretic.  HENT:     Head: Normocephalic.     Right Ear: External ear normal.     Left Ear: External ear normal.     Nose: Nose normal.     Mouth/Throat:  Mouth: Mucous membranes are moist.     Pharynx: Oropharynx is clear.  Eyes:     General:        Right eye: No discharge.        Left eye: No discharge.     Extraocular Movements: Extraocular movements intact.     Conjunctiva/sclera: Conjunctivae normal.     Pupils: Pupils are equal, round, and reactive to light.  Cardiovascular:     Rate and Rhythm: Normal rate and regular rhythm.     Heart sounds: No murmur heard. Pulmonary:     Effort: Pulmonary effort is normal. No respiratory distress.     Breath sounds: Normal breath sounds. No wheezing or rales.  Abdominal:     General: Abdomen is flat. Bowel sounds are normal. There is no distension.     Palpations: Abdomen is soft.     Tenderness: There is  no abdominal tenderness. There is left CVA tenderness. There is no guarding.  Musculoskeletal:        General: No swelling.     Cervical back: Normal range of motion and neck supple.  Skin:    General: Skin is warm and dry.     Capillary Refill: Capillary refill takes less than 2 seconds.  Neurological:     General: No focal deficit present.     Mental Status: She is alert and oriented to person, place, and time. Mental status is at baseline.  Psychiatric:        Mood and Affect: Mood normal.        Behavior: Behavior normal.        Thought Content: Thought content normal.        Judgment: Judgment normal.    Results for orders placed or performed in visit on 06/22/21  Comp Met (CMET)  Result Value Ref Range   Glucose 127 (H) 65 - 99 mg/dL   BUN 14 6 - 24 mg/dL   Creatinine, Ser 0.99 0.57 - 1.00 mg/dL   eGFR 70 >59 mL/min/1.73   BUN/Creatinine Ratio 14 9 - 23   Sodium 139 134 - 144 mmol/L   Potassium 4.2 3.5 - 5.2 mmol/L   Chloride 99 96 - 106 mmol/L   CO2 23 20 - 29 mmol/L   Calcium 9.8 8.7 - 10.2 mg/dL   Total Protein 7.0 6.0 - 8.5 g/dL   Albumin 4.5 3.8 - 4.8 g/dL   Globulin, Total 2.5 1.5 - 4.5 g/dL   Albumin/Globulin Ratio 1.8 1.2 - 2.2   Bilirubin Total 0.3 0.0 - 1.2 mg/dL   Alkaline Phosphatase 93 44 - 121 IU/L   AST 18 0 - 40 IU/L   ALT 29 0 - 32 IU/L  Thyroid Panel With TSH  Result Value Ref Range   TSH 3.830 0.450 - 4.500 uIU/mL   T4, Total 7.5 4.5 - 12.0 ug/dL   T3 Uptake Ratio 26 24 - 39 %   Free Thyroxine Index 2.0 1.2 - 4.9  Vitamin D (25 hydroxy)  Result Value Ref Range   Vit D, 25-Hydroxy 30.6 30.0 - 100.0 ng/mL  Anemia Profile B  Result Value Ref Range   Total Iron Binding Capacity 331 250 - 450 ug/dL   UIBC 277 131 - 425 ug/dL   Iron 54 27 - 159 ug/dL   Iron Saturation 16 15 - 55 %   Ferritin 40 15 - 150 ng/mL   Vitamin B-12 483 232 - 1,245 pg/mL   Folate 6.1 >3.0 ng/mL   WBC  7.4 3.4 - 10.8 x10E3/uL   RBC 5.24 3.77 - 5.28 x10E6/uL    Hemoglobin 15.4 11.1 - 15.9 g/dL   Hematocrit 46.2 34.0 - 46.6 %   MCV 88 79 - 97 fL   MCH 29.4 26.6 - 33.0 pg   MCHC 33.3 31.5 - 35.7 g/dL   RDW 12.6 11.7 - 15.4 %   Platelets 342 150 - 450 x10E3/uL   Neutrophils 75 Not Estab. %   Lymphs 18 Not Estab. %   Monocytes 5 Not Estab. %   Eos 2 Not Estab. %   Basos 0 Not Estab. %   Neutrophils Absolute 5.5 1.4 - 7.0 x10E3/uL   Lymphocytes Absolute 1.3 0.7 - 3.1 x10E3/uL   Monocytes Absolute 0.4 0.1 - 0.9 x10E3/uL   EOS (ABSOLUTE) 0.1 0.0 - 0.4 x10E3/uL   Basophils Absolute 0.0 0.0 - 0.2 x10E3/uL   Immature Granulocytes 0 Not Estab. %   Immature Grans (Abs) 0.0 0.0 - 0.1 x10E3/uL   Retic Ct Pct 2.1 0.6 - 2.6 %      Assessment & Plan:   Problem List Items Addressed This Visit       Other   Anxiety and depression - Primary    Chronic.  Improving.  Continue with current medication regimen on Effexor $RemoveBe'150mg'[id number]$  daily.  Refill sent today.   Return to clinic in 3 months for reevaluation.  Call sooner if concerns arise.        Relevant Medications   venlafaxine XR (EFFEXOR-XR) 150 MG 24 hr capsule   Other Visit Diagnoses     Acute cystitis with hematuria       No leuks but + for blood. Will treat w/ macrobid. Concern for kidney pain due to no leuks and back pain. If not better by monday will order CT for r/o stone.   Relevant Orders   Urine Culture   Dysuria       UA and Wet prep obtained during visit.   Relevant Orders   Urinalysis, Routine w reflex microscopic   WET PREP FOR TRICH, YEAST, CLUE   Bacterial vaginosis       Found on Wet prep. Will treat with vaginal Flagyl. Follow up if symptoms do not improve.   Relevant Medications   nitrofurantoin, macrocrystal-monohydrate, (MACROBID) 100 MG capsule        Follow up plan: Return in about 3 months (around 11/25/2021) for Depression/Anxiety FU, HTN, HLD, DM2 FU.   A total of 30 minutes were spent on this encounter today.  When total time is documented, this includes both the  face-to-face and non-face-to-face time personally spent before, during and after the visit on the date of the encounter obtaining labs, reviewing results and discussing plan of care.

## 2021-08-25 NOTE — Assessment & Plan Note (Signed)
Chronic.  Improving.  Continue with current medication regimen on Effexor 150mg  daily.  Refill sent today.   Return to clinic in 3 months for reevaluation.  Call sooner if concerns arise.

## 2021-08-26 NOTE — Progress Notes (Signed)
Results discussed with patient during visit.  Treated with macrobid and vaginal flagyl.

## 2021-09-20 ENCOUNTER — Ambulatory Visit: Payer: Self-pay | Admitting: *Deleted

## 2021-09-20 NOTE — Telephone Encounter (Signed)
Reason for Disposition  [1] MODERATE sweating (e.g., interferes with normal activities like work or school) AND [2] possibly related to new medication or change in medication dosage  Answer Assessment - Initial Assessment Questions 1. ONSET: "When did the sweating start?"      2 weeks ago worsening this week 2. LOCATION: "What part of your body has excessive sweating?" (e.g., entire body; just face, underarms, palms, or soles of feet).      All over 3. SEVERITY: "How bad is the sweating?"    (Scale 1-10; or mild, moderate, severe)   -  MILD (1-3): doesn't interfere with normal activities    -  MODERATE (4-7): interferes with normal activities (e.g., work or school) or awakens from sleep; causes embarrassment in social situations    -  SEVERE (8-10): drenching sweats and has to change bed clothes or bed linens.     Occurs when I get up to do things 4. CAUSE: "What do you think is causing the sweating?"     Unsure 5. FEVER: "Have you been having fevers?"     no 6. OTHER SYMPTOMS: "Do you have any other symptoms?" (e.g., chest pain, difficulty breathing, lightheadedness, weight loss)     Blurred vision occurs, dizziness, hungry all the time, increased fatigue, confused a lot, anxiety worse  Protocols used: Sweating-A-AH

## 2021-09-20 NOTE — Telephone Encounter (Signed)
°  Chief Complaint: Excessive sweating Symptoms: Sweating, blurred vision, always hungry, increased fatigue, dizziness with episodes Frequency: Episodes occur daily Pertinent Negatives: Patient denies no SOB,no fever, resolves after eating.  Disposition: [] ED /[] Urgent Care (no appt availability in office) / [x] Appointment(In office/virtual)/ []  [facility]/ [] Home Care/ [] Refused Recommended Disposition  Additional Notes: Episodes x 2 weeks. Sweating and blurred vision with minimal activity. Symptoms resolve after eating. "Always hungry, confused at times." Appt made for Thursday. Advised ED for worsening symptoms. Pt verbalizes understanding.

## 2021-09-22 ENCOUNTER — Encounter: Payer: Self-pay | Admitting: Nurse Practitioner

## 2021-09-22 ENCOUNTER — Other Ambulatory Visit: Payer: Self-pay | Admitting: Nurse Practitioner

## 2021-09-22 ENCOUNTER — Other Ambulatory Visit: Payer: Self-pay

## 2021-09-22 ENCOUNTER — Ambulatory Visit (INDEPENDENT_AMBULATORY_CARE_PROVIDER_SITE_OTHER): Admitting: Nurse Practitioner

## 2021-09-22 VITALS — BP 116/80 | HR 80 | Temp 98.5°F | Wt 206.0 lb

## 2021-09-22 DIAGNOSIS — R7301 Impaired fasting glucose: Secondary | ICD-10-CM

## 2021-09-22 DIAGNOSIS — R200 Anesthesia of skin: Secondary | ICD-10-CM | POA: Diagnosis not present

## 2021-09-22 DIAGNOSIS — F419 Anxiety disorder, unspecified: Secondary | ICD-10-CM | POA: Diagnosis not present

## 2021-09-22 DIAGNOSIS — R634 Abnormal weight loss: Secondary | ICD-10-CM

## 2021-09-22 DIAGNOSIS — R202 Paresthesia of skin: Secondary | ICD-10-CM

## 2021-09-22 DIAGNOSIS — F32A Depression, unspecified: Secondary | ICD-10-CM

## 2021-09-22 DIAGNOSIS — R61 Generalized hyperhidrosis: Secondary | ICD-10-CM | POA: Diagnosis not present

## 2021-09-22 MED ORDER — VENLAFAXINE HCL ER 75 MG PO CP24: 75.0000 mg | ORAL_CAPSULE | Freq: Every day | ORAL | 1 refills | Status: DC

## 2021-09-22 NOTE — Assessment & Plan Note (Signed)
Patient complaining of irritability. Will increase Effexor to 225mg  to see if symptoms improve.  Patient understands and agrees with the plan of care.  Will follow up with patient once labs have returned.

## 2021-09-22 NOTE — Progress Notes (Signed)
BP 116/80    Pulse 80    Temp 98.5 F (36.9 C) (Oral)    Wt 206 lb (93.4 kg)    SpO2 97%    BMI 33.25 kg/m    Subjective:    Patient ID: Latasha Lawrence, female    DOB: 09/12/1971, 50 y.o.   MRN: [id number]  HPI: Latasha Lawrence is a 50 y.o. female  Chief Complaint  Patient presents with   Excessive Sweating    Pt states that for the last few weeks she has noticed an increase of sweating. States she has been irritable and anxious as well. States she wonders if it may be her blood sugar.    Patient presents to clinic with complaints of increased urination, increased thirstiness, fatigue, excessive sweating, weight loss.  She has been more irritable and moody along with other symptoms.     Relevant past medical, surgical, family and social history reviewed and updated as indicated. Interim medical history since our last visit reviewed. Allergies and medications reviewed and updated.  Review of Systems  Constitutional:  Positive for fatigue and unexpected weight change.  Endocrine: Positive for cold intolerance, polydipsia and polyuria.  Psychiatric/Behavioral:  Positive for agitation.    Per HPI unless specifically indicated above     Objective:    BP 116/80    Pulse 80    Temp 98.5 F (36.9 C) (Oral)    Wt 206 lb (93.4 kg)    SpO2 97%    BMI 33.25 kg/m   Wt Readings from Last 3 Encounters:  09/22/21 206 lb (93.4 kg)  08/25/21 207 lb 6.4 oz (94.1 kg)  07/12/21 200 lb (90.7 kg)    Physical Exam Vitals and nursing note reviewed.  Constitutional:      General: She is not in acute distress.    Appearance: Normal appearance. She is normal weight. She is not ill-appearing, toxic-appearing or diaphoretic.  HENT:     Head: Normocephalic.     Right Ear: External ear normal.     Left Ear: External ear normal.     Nose: Nose normal.     Mouth/Throat:     Mouth: Mucous membranes are moist.     Pharynx: Oropharynx is clear.  Eyes:     General:        Right eye: No discharge.         Left eye: No discharge.     Extraocular Movements: Extraocular movements intact.     Conjunctiva/sclera: Conjunctivae normal.     Pupils: Pupils are equal, round, and reactive to light.  Cardiovascular:     Rate and Rhythm: Normal rate and regular rhythm.     Heart sounds: No murmur heard. Pulmonary:     Effort: Pulmonary effort is normal. No respiratory distress.     Breath sounds: Normal breath sounds. No wheezing or rales.  Musculoskeletal:     Cervical back: Normal range of motion and neck supple.  Skin:    General: Skin is warm and dry.     Capillary Refill: Capillary refill takes less than 2 seconds.  Neurological:     General: No focal deficit present.     Mental Status: She is alert and oriented to person, place, and time. Mental status is at baseline.  Psychiatric:        Mood and Affect: Mood normal.        Behavior: Behavior normal.        Thought Content: Thought content normal.  Judgment: Judgment normal.    Results for orders placed or performed in visit on 08/25/21  Microscopic Examination  Result Value Ref Range   WBC, UA None seen 0 - 5 /hpf   RBC 0-2 0 - 2 /hpf   Epithelial Cells (non renal) 0-10 0 - 10 /hpf   Bacteria, UA Few (A) None seen/Few  WET PREP FOR TRICH, YEAST, CLUE   Urine  Result Value Ref Range   Trichomonas Exam Negative Negative   Yeast Exam Negative Negative   Clue Cell Exam Positive (A) Negative  Urinalysis, Routine w reflex microscopic  Result Value Ref Range   Specific Gravity, UA 1.015 1.005 - 1.030   pH, UA 6.0 5.0 - 7.5   Color, UA Yellow Yellow   Appearance Ur Cloudy (A) Clear   Leukocytes,UA Negative Negative   Protein,UA Negative Negative/Trace   Glucose, UA Negative Negative   Ketones, UA Negative Negative   RBC, UA Trace (A) Negative   Bilirubin, UA Negative Negative   Urobilinogen, Ur 0.2 0.2 - 1.0 mg/dL   Nitrite, UA Negative Negative   Microscopic Examination See below:       Assessment & Plan:    Problem List Items Addressed This Visit       Endocrine   IFG (impaired fasting glucose)    Labs ordered during visit. Due to patient's symptoms suspect diabetes.  Will make recommendations based on lab results.         Other   Anxiety and depression - Primary    Patient complaining of irritability. Will increase Effexor to $RemoveBe'225mg'[id number]$  to see if symptoms improve.  Patient understands and agrees with the plan of care.  Will follow up with patient once labs have returned.       Relevant Medications   venlafaxine XR (EFFEXOR XR) 75 MG 24 hr capsule   Other Visit Diagnoses     Sweating profusely       Labs ordered to evaluate for Autoimmune concern. Will make reocmmendaitons based on lab results.    Relevant Medications   venlafaxine XR (EFFEXOR XR) 75 MG 24 hr capsule   Other Relevant Orders   HgB A1c   Comp Met (CMET)   Rheumatoid factor   ANA   ANA+ENA+DNA/DS+Antich+Centr   CK   Uric acid   Ehrlichia antibody panel   Babesia microti Antibody Panel   CBC with Differential/Platelet   B12   Numbness and tingling       B12 ordered to evaluate for deficiency. Will make recommendations based on lab results.    Relevant Orders   B12   Weight loss       Diabetes suspected.  Patient has had IFG in the past.  Will make recommendations based on lab results.    Relevant Medications   venlafaxine XR (EFFEXOR XR) 75 MG 24 hr capsule   Other Relevant Orders   HgB A1c   Comp Met (CMET)   Rheumatoid factor   ANA   ANA+ENA+DNA/DS+Antich+Centr   CK   Uric acid   Ehrlichia antibody panel   Babesia microti Antibody Panel   CBC with Differential/Platelet   B12        Follow up plan: No follow-ups on file.

## 2021-09-22 NOTE — Assessment & Plan Note (Signed)
Labs ordered during visit. Due to patient's symptoms suspect diabetes.  Will make recommendations based on lab results.

## 2021-09-23 NOTE — Telephone Encounter (Signed)
Requested Prescriptions  Pending Prescriptions Disp Refills   losartan (COZAAR) 50 MG tablet [Pharmacy Med Name: LOSARTAN POTASSIUM 50 MG TAB] 90 tablet 0    Sig: Take 1 tablet (50 mg total) by mouth daily.     Cardiovascular:  Angiotensin Receptor Blockers Passed - 09/22/2021 11:11 AM      Passed - Cr in normal range and within 180 days    Creatinine  Date Value Ref Range Status  09/06/2014 0.97 0.60 - 1.30 mg/dL Final   Creatinine, Ser  Date Value Ref Range Status  06/22/2021 0.99 0.57 - 1.00 mg/dL Final         Passed - K in normal range and within 180 days    Potassium  Date Value Ref Range Status  06/22/2021 4.2 3.5 - 5.2 mmol/L Final  09/06/2014 3.8 3.5 - 5.1 mmol/L Final         Passed - Patient is not pregnant      Passed - Last BP in normal range    BP Readings from Last 1 Encounters:  09/22/21 116/80         Passed - Valid encounter within last 6 months    Recent Outpatient Visits          Yesterday Anxiety and depression   [facility] Larae Grooms, NP   4 weeks ago Anxiety and depression   [facility] Larae Grooms, NP   2 months ago Breast abscess   [facility] Vigg, Avanti, MD   3 months ago Anxiety and depression   [facility] [place], Clydie Braun, NP   4 months ago Insect bite of right lower leg, initial encounter   [facility] Larae Grooms, NP              hydrOXYzine (VISTARIL) 100 MG capsule [Pharmacy Med Name: HYDROXYZINE PAM 100 MG CAP] 90 capsule 0    Sig: Take 1 capsule (100 mg total) by mouth 3 (three) times daily as needed.     Ear, Nose, and Throat:  Antihistamines Passed - 09/22/2021 11:11 AM      Passed - Valid encounter within last 12 months    Recent Outpatient Visits          Yesterday Anxiety and depression   [facility] Larae Grooms, NP   4 weeks ago Anxiety and depression   [facility] Larae Grooms, NP   2  months ago Breast abscess   [facility] Vigg, Avanti, MD   3 months ago Anxiety and depression   [facility] Larae Grooms, NP   4 months ago Insect bite of right lower leg, initial encounter   [facility] Larae Grooms, NP              traZODone (DESYREL) 100 MG tablet [Pharmacy Med Name: TRAZODONE 100 MG TABLET] 180 tablet 0    Sig: Take 2 tablets (200 mg total) by mouth at bedtime as needed for sleep.     Psychiatry: Antidepressants - Serotonin Modulator Passed - 09/22/2021 11:11 AM      Passed - Completed PHQ-2 or PHQ-9 in the last 360 days      Passed - Valid encounter within last 6 months    Recent Outpatient Visits          Yesterday Anxiety and depression   [facility] Larae Grooms, NP   4 weeks ago Anxiety and depression   [facility] Larae Grooms, NP  2 months ago Breast abscess   [facility] Vigg, Avanti, MD   3 months ago Anxiety and depression   [facility] Larae Grooms, NP   4 months ago Insect bite of right lower leg, initial encounter   [facility] Larae Grooms, NP

## 2021-09-23 NOTE — Telephone Encounter (Signed)
Requested Prescriptions  Pending Prescriptions Disp Refills   losartan (COZAAR) 50 MG tablet [Pharmacy Med Name: LOSARTAN POTASSIUM 50 MG TAB] 90 tablet 0    Sig: Take 1 tablet (50 mg total) by mouth daily.     Cardiovascular:  Angiotensin Receptor Blockers Passed - 09/22/2021 11:11 AM      Passed - Cr in normal range and within 180 days    Creatinine  Date Value Ref Range Status  09/06/2014 0.97 0.60 - 1.30 mg/dL Final   Creatinine, Ser  Date Value Ref Range Status  06/22/2021 0.99 0.57 - 1.00 mg/dL Final         Passed - K in normal range and within 180 days    Potassium  Date Value Ref Range Status  06/22/2021 4.2 3.5 - 5.2 mmol/L Final  09/06/2014 3.8 3.5 - 5.1 mmol/L Final         Passed - Patient is not pregnant      Passed - Last BP in normal range    BP Readings from Last 1 Encounters:  09/22/21 116/80         Passed - Valid encounter within last 6 months    Recent Outpatient Visits          Yesterday Anxiety and depression   [facility] Larae Grooms, NP   4 weeks ago Anxiety and depression   [facility] Larae Grooms, NP   2 months ago Breast abscess   [facility] Vigg, Avanti, MD   3 months ago Anxiety and depression   [facility] [place], Clydie Braun, NP   4 months ago Insect bite of right lower leg, initial encounter   [facility] Larae Grooms, NP              traZODone (DESYREL) 100 MG tablet [Pharmacy Med Name: TRAZODONE 100 MG TABLET] 180 tablet 0    Sig: Take 2 tablets (200 mg total) by mouth at bedtime as needed for sleep.     Psychiatry: Antidepressants - Serotonin Modulator Passed - 09/22/2021 11:11 AM      Passed - Completed PHQ-2 or PHQ-9 in the last 360 days      Passed - Valid encounter within last 6 months    Recent Outpatient Visits          Yesterday Anxiety and depression   [facility] Larae Grooms, NP   4 weeks ago Anxiety and  depression   [facility] Larae Grooms, NP   2 months ago Breast abscess   [facility] Vigg, Avanti, MD   3 months ago Anxiety and depression   [facility] Larae Grooms, NP   4 months ago Insect bite of right lower leg, initial encounter   [facility] Larae Grooms, NP             Signed Prescriptions Disp Refills   hydrOXYzine (VISTARIL) 100 MG capsule 90 capsule 0    Sig: Take 1 capsule (100 mg total) by mouth 3 (three) times daily as needed.     Ear, Nose, and Throat:  Antihistamines Passed - 09/22/2021 11:11 AM      Passed - Valid encounter within last 12 months    Recent Outpatient Visits          Yesterday Anxiety and depression   [facility] Larae Grooms, NP   4 weeks ago Anxiety and depression   [facility] Larae Grooms, NP   2 months  ago Breast abscess   [facility] Vigg, Avanti, MD   3 months ago Anxiety and depression   [facility] Larae Grooms, NP   4 months ago Insect bite of right lower leg, initial encounter   [facility] Larae Grooms, NP

## 2021-09-26 LAB — COMPREHENSIVE METABOLIC PANEL
ALT: 22 IU/L (ref 0–32)
AST: 17 IU/L (ref 0–40)
Albumin/Globulin Ratio: 1.7 (ref 1.2–2.2)
Albumin: 4.8 g/dL (ref 3.8–4.8)
Alkaline Phosphatase: 85 IU/L (ref 44–121)
BUN/Creatinine Ratio: 14 (ref 9–23)
BUN: 13 mg/dL (ref 6–24)
Bilirubin Total: 0.4 mg/dL (ref 0.0–1.2)
CO2: 26 mmol/L (ref 20–29)
Calcium: 9.8 mg/dL (ref 8.7–10.2)
Chloride: 95 mmol/L — ABNORMAL LOW (ref 96–106)
Creatinine, Ser: 0.94 mg/dL (ref 0.57–1.00)
Globulin, Total: 2.8 g/dL (ref 1.5–4.5)
Glucose: 115 mg/dL — ABNORMAL HIGH (ref 70–99)
Potassium: 4.3 mmol/L (ref 3.5–5.2)
Sodium: 138 mmol/L (ref 134–144)
Total Protein: 7.6 g/dL (ref 6.0–8.5)
eGFR: 75 mL/min/{1.73_m2} (ref >59)

## 2021-09-26 LAB — CBC WITH DIFFERENTIAL/PLATELET
Basophils Absolute: 0 10*3/uL (ref 0.0–0.2)
Basos: 0 %
EOS (ABSOLUTE): 0.1 10*3/uL (ref 0.0–0.4)
Eos: 1 %
Hematocrit: 46.7 % — ABNORMAL HIGH (ref 34.0–46.6)
Hemoglobin: 15.9 g/dL (ref 11.1–15.9)
Immature Grans (Abs): 0 10*3/uL (ref 0.0–0.1)
Immature Granulocytes: 0 %
Lymphocytes Absolute: 1.5 10*3/uL (ref 0.7–3.1)
Lymphs: 16 %
MCH: 29.2 pg (ref 26.6–33.0)
MCHC: 34 g/dL (ref 31.5–35.7)
MCV: 86 fL (ref 79–97)
Monocytes Absolute: 0.5 10*3/uL (ref 0.1–0.9)
Monocytes: 5 %
Neutrophils Absolute: 7.6 10*3/uL — ABNORMAL HIGH (ref 1.4–7.0)
Neutrophils: 78 %
Platelets: 319 10*3/uL (ref 150–450)
RBC: 5.45 x10E6/uL — ABNORMAL HIGH (ref 3.77–5.28)
RDW: 13.5 % (ref 11.7–15.4)
WBC: 9.8 10*3/uL (ref 3.4–10.8)

## 2021-09-26 LAB — ANA+ENA+DNA/DS+ANTICH+CENTR
ANA Titer 1: NEGATIVE
Anti JO-1: <0.2 AI (ref 0.0–0.9)
Centromere Ab Screen: <0.2 AI (ref 0.0–0.9)
Chromatin Ab SerPl-aCnc: <0.2 AI (ref 0.0–0.9)
ENA RNP Ab: 0.3 AI (ref 0.0–0.9)
ENA SM Ab Ser-aCnc: <0.2 AI (ref 0.0–0.9)
ENA SSA (RO) Ab: <0.2 AI (ref 0.0–0.9)
ENA SSB (LA) Ab: <0.2 AI (ref 0.0–0.9)
Scleroderma (Scl-70) (ENA) Antibody, IgG: <0.2 AI (ref 0.0–0.9)
dsDNA Ab: <1 IU/mL (ref 0–9)

## 2021-09-26 LAB — VITAMIN B12: Vitamin B-12: 470 pg/mL (ref 232–1245)

## 2021-09-26 LAB — ANA: Anti Nuclear Antibody (ANA): NEGATIVE

## 2021-09-26 LAB — BABESIA MICROTI ANTIBODY PANEL
Babesia microti IgG: <1:10 {titer}
Babesia microti IgM: <1:10 {titer}

## 2021-09-26 LAB — URIC ACID: Uric Acid: 4.5 mg/dL (ref 2.6–6.2)

## 2021-09-26 LAB — HEMOGLOBIN A1C
Est. average glucose Bld gHb Est-mCnc: 108 mg/dL
Hgb A1c MFr Bld: 5.4 % (ref 4.8–5.6)

## 2021-09-26 LAB — EHRLICHIA ANTIBODY PANEL
E. Chaffeensis (HME) IgM Titer: NEGATIVE
E.Chaffeensis (HME) IgG: NEGATIVE
HGE IgG Titer: NEGATIVE
HGE IgM Titer: NEGATIVE

## 2021-09-26 LAB — RHEUMATOID FACTOR: Rheumatoid fact SerPl-aCnc: <10 IU/mL (ref <14.0)

## 2021-09-26 LAB — CK: Total CK: 56 U/L (ref 32–182)

## 2021-09-27 ENCOUNTER — Telehealth: Payer: Self-pay | Admitting: Nurse Practitioner

## 2021-09-27 NOTE — Telephone Encounter (Signed)
Copied from CRM [id number]. Topic: General - Other >> Sep 27, 2021  1:22 PM Gwenlyn Fudge wrote: Reason for CRM: Pt called and is requesting to have a nurse return her call to go over her lab results. Please advise.

## 2021-09-27 NOTE — Progress Notes (Signed)
Please let patient know that her labs are unremarkable and do not explain her symptoms.  At this point it seems like they are related to menopause.  I would like to start Gabapentin 100mg  at bedtime to see if this helps with her symptoms.  If she agrees, I can send this to the pharmacy.  Please have her return in 1 month.

## 2021-09-27 NOTE — Telephone Encounter (Signed)
See result note.  

## 2021-09-28 MED ORDER — GABAPENTIN 100 MG PO CAPS: 100.0000 mg | ORAL_CAPSULE | Freq: Every day | ORAL | 1 refills | Status: DC

## 2021-09-28 NOTE — Progress Notes (Signed)
Medication sent to the pharmacy.

## 2021-09-28 NOTE — Addendum Note (Signed)
Addended by: Larae Grooms on: 09/28/2021 11:45 AM   Modules accepted: Orders

## 2021-10-13 ENCOUNTER — Ambulatory Visit: Admitting: Dermatology

## 2021-10-21 ENCOUNTER — Other Ambulatory Visit: Payer: Self-pay | Admitting: Nurse Practitioner

## 2021-10-21 NOTE — Telephone Encounter (Signed)
Requested medication (s) are due for refill today:   Provider to review  Requested medication (s) are on the active medication list:   Yes  Future visit scheduled:   Yes   Last ordered: 08/08/2021 #20, 0 refills  Non delegated refill   Requested Prescriptions  Pending Prescriptions Disp Refills   ondansetron (ZOFRAN-ODT) 4 MG disintegrating tablet [Pharmacy Med Name: ONDANSETRON ODT 4 MG TABLET] 20 tablet 0    Sig: Take 1 tablet (4 mg total) by mouth every 8 (eight) hours as needed for nausea or vomiting.     Not Delegated - Gastroenterology: Antiemetics Failed - 10/21/2021 11:08 AM      Failed - This refill cannot be delegated      Passed - Valid encounter within last 6 months    Recent Outpatient Visits           4 weeks ago Anxiety and depression   [facility] Larae Grooms, NP   1 month ago Anxiety and depression   [facility] Larae Grooms, NP   3 months ago Breast abscess   [facility] Vigg, Avanti, MD   4 months ago Anxiety and depression   [facility] Larae Grooms, NP   5 months ago Insect bite of right lower leg, initial encounter   [facility] Larae Grooms, NP       Future Appointments             In 1 month Larae Grooms, NP [facility]

## 2021-10-31 ENCOUNTER — Other Ambulatory Visit: Payer: Self-pay | Admitting: Nurse Practitioner

## 2021-10-31 NOTE — Telephone Encounter (Signed)
Requested Prescriptions  Pending Prescriptions Disp Refills   traZODone (DESYREL) 100 MG tablet [Pharmacy Med Name: TRAZODONE 100 MG TABLET] 180 tablet 0    Sig: Take 2 tablets (200 mg total) by mouth at bedtime as needed for sleep.     Psychiatry: Antidepressants - Serotonin Modulator Passed - 10/31/2021  2:40 PM      Passed - Completed PHQ-2 or PHQ-9 in the last 360 days      Passed - Valid encounter within last 6 months    Recent Outpatient Visits          1 month ago Anxiety and depression   [place], NP   2 months ago Anxiety and depression   [facility] Jon Billings, NP   3 months ago Breast abscess   [facility] Vigg, Avanti, MD   4 months ago Anxiety and depression   [facility] Jon Billings, NP   5 months ago Insect bite of right lower leg, initial encounter   [facility] Jon Billings, NP      Future Appointments            In 1 month Jon Billings, NP [facility] [place]

## 2021-11-07 ENCOUNTER — Other Ambulatory Visit: Payer: Self-pay | Admitting: Nurse Practitioner

## 2021-11-07 NOTE — Telephone Encounter (Signed)
Requested medication (s) are due for refill today: yes  Requested medication (s) are on the active medication list: yes  Last refill:  08/08/21  Future visit scheduled: 11/30/21  Notes to clinic:  This medication can not be delegated, please assess.   Requested Prescriptions  Pending Prescriptions Disp Refills   ondansetron (ZOFRAN-ODT) 4 MG disintegrating tablet [Pharmacy Med Name: ONDANSETRON ODT 4 MG TABLET] 20 tablet 0    Sig: Take 1 tablet (4 mg total) by mouth every 8 (eight) hours as needed for nausea or vomiting.     Not Delegated - Gastroenterology: Antiemetics Failed - 11/07/2021  3:01 PM      Failed - This refill cannot be delegated      Passed - Valid encounter within last 6 months    Recent Outpatient Visits           1 month ago Anxiety and depression   [facility] Larae Grooms, NP   2 months ago Anxiety and depression   [facility] Larae Grooms, NP   3 months ago Breast abscess   [facility] Vigg, Avanti, MD   4 months ago Anxiety and depression   [facility] Larae Grooms, NP   6 months ago Insect bite of right lower leg, initial encounter   [facility] Larae Grooms, NP       Future Appointments             In 3 weeks Larae Grooms, NP [facility], [facility]

## 2021-11-09 ENCOUNTER — Other Ambulatory Visit: Payer: Self-pay | Admitting: Nurse Practitioner

## 2021-11-10 NOTE — Telephone Encounter (Signed)
Requesting early for Aldactone and Lipitor. Requested Prescriptions  Pending Prescriptions Disp Refills   spironolactone (ALDACTONE) 25 MG tablet [Pharmacy Med Name: SPIRONOLACTONE 25 MG TABLET] 30 tablet 0    Sig: Take 1 tablet (25 mg total) by mouth daily.     Cardiovascular: Diuretics - Aldosterone Antagonist Passed - 11/09/2021  4:28 PM      Passed - Cr in normal range and within 180 days    Creatinine  Date Value Ref Range Status  09/06/2014 0.97 0.60 - 1.30 mg/dL Final   Creatinine, Ser  Date Value Ref Range Status  09/22/2021 0.94 0.57 - 1.00 mg/dL Final         Passed - K in normal range and within 180 days    Potassium  Date Value Ref Range Status  09/22/2021 4.3 3.5 - 5.2 mmol/L Final  09/06/2014 3.8 3.5 - 5.1 mmol/L Final         Passed - Na in normal range and within 180 days    Sodium  Date Value Ref Range Status  09/22/2021 138 134 - 144 mmol/L Final  09/06/2014 138 136 - 145 mmol/L Final         Passed - eGFR is 30 or above and within 180 days    EGFR (African American)  Date Value Ref Range Status  09/06/2014 >60 >18m/min Final  01/21/2014 >60  Final   GFR calc Af Amer  Date Value Ref Range Status  02/19/2020 77 >59 mL/min/1.73 Final    Comment:    **[facility] currently reports eGFR in compliance with the current**   recommendations of the [employer] [facility] will   update reporting as new guidelines are published from the NKF-ASN   Task force.    EGFR (Non-African Amer.)  Date Value Ref Range Status  09/06/2014 >60 >67mmin Final    Comment:    eGFR values <6053min/1.73 m2 may be an indication of chronic kidney disease (CKD). Calculated eGFR, using the MRDR Study equation, is useful in  patients with stable renal function. The eGFR calculation will not be reliable in acutely ill patients when serum creatinine is changing rapidly. It is not useful in patients on dialysis. The eGFR calculation may not be applicable to patients  at the low and high extremes of body sizes, pregnant women, and vegetarians.   01/21/2014 >60  Final    Comment:    eGFR values <48m42mn/1.73 m2 may be an indication of chronic kidney disease (CKD). Calculated eGFR is useful in patients with stable renal function. The eGFR calculation will not be reliable in acutely ill patients when serum creatinine is changing rapidly. It is not useful in  patients on dialysis. The eGFR calculation may not be applicable to patients at the low and high extremes of body sizes, pregnant women, and vegetarians. POTASSIUM - Slight hemolysis, interpret results with  - caution.    GFR, Estimated  Date Value Ref Range Status  06/04/2021 >60 >60 mL/min Final    Comment:    (NOTE) Calculated using the CKD-EPI Creatinine Equation (2021)    eGFR  Date Value Ref Range Status  09/22/2021 75 >59 mL/min/1.73 Final         Passed - Last BP in normal range    BP Readings from Last 1 Encounters:  09/22/21 116/80         Passed - Valid encounter within last 6 months    Recent Outpatient Visits          1  month ago Anxiety and depression   [place], [place], NP   2 months ago Anxiety and depression   [facility] Jon Billings, NP   4 months ago Breast abscess   [facility] Vigg, Avanti, MD   4 months ago Anxiety and depression   [facility] Jon Billings, NP   6 months ago Insect bite of right lower leg, initial encounter   [facility] Jon Billings, NP      Future Appointments            In 2 weeks Jon Billings, NP [place]            atorvastatin (LIPITOR) 80 MG tablet [Pharmacy Med Name: ATORVASTATIN 80 MG TABLET] 30 tablet 0    Sig: Take 1 tablet (80 mg total) by mouth daily at 6 PM.     Cardiovascular:  Antilipid - Statins Failed - 11/09/2021  4:28 PM      Failed - Lipid Panel in normal range within the last 12 months     Cholesterol, Total  Date Value Ref Range Status  01/31/2021 121 100 - 199 mg/dL Final   Cholesterol  Date Value Ref Range Status  01/19/2014 268 (H) 0 - 200 mg/dL Final   Ldl Cholesterol, Calc  Date Value Ref Range Status  01/19/2014 167 (H) 0 - 100 mg/dL Final   LDL Chol Calc (NIH)  Date Value Ref Range Status  01/31/2021 59 0 - 99 mg/dL Final   HDL Cholesterol  Date Value Ref Range Status  01/19/2014 33 (L) 40 - 60 mg/dL Final   HDL  Date Value Ref Range Status  01/31/2021 43 >39 mg/dL Final   Triglycerides  Date Value Ref Range Status  01/31/2021 104 0 - 149 mg/dL Final  01/19/2014 340 (H) 0 - 200 mg/dL Final         Passed - Patient is not pregnant      Passed - Valid encounter within last 12 months    Recent Outpatient Visits          1 month ago Anxiety and depression   [facility] Jon Billings, NP   2 months ago Anxiety and depression   [facility] Jon Billings, NP   4 months ago Breast abscess   [facility] Vigg, Avanti, MD   4 months ago Anxiety and depression   [facility] Jon Billings, NP   6 months ago Insect bite of right lower leg, initial encounter   [facility] Jon Billings, NP      Future Appointments            In 2 weeks Jon Billings, NP [place], [facility]            losartan (COZAAR) 50 MG tablet [Pharmacy Med Name: LOSARTAN POTASSIUM 50 MG TAB] 90 tablet 0    Sig: Take 1 tablet (50 mg total) by mouth daily.     Cardiovascular:  Angiotensin Receptor Blockers Passed - 11/09/2021  4:28 PM      Passed - Cr in normal range and within 180 days    Creatinine  Date Value Ref Range Status  09/06/2014 0.97 0.60 - 1.30 mg/dL Final   Creatinine, Ser  Date Value Ref Range Status  09/22/2021 0.94 0.57 - 1.00 mg/dL Final         Passed - K in normal range and within 180 days  Potassium  Date Value Ref Range Status  09/22/2021 4.3 3.5 -  5.2 mmol/L Final  09/06/2014 3.8 3.5 - 5.1 mmol/L Final         Passed - Patient is not pregnant      Passed - Last BP in normal range    BP Readings from Last 1 Encounters:  09/22/21 116/80         Passed - Valid encounter within last 6 months    Recent Outpatient Visits          1 month ago Anxiety and depression   [place], NP   2 months ago Anxiety and depression   [facility] Jon Billings, NP   4 months ago Breast abscess   [facility] Vigg, Avanti, MD   4 months ago Anxiety and depression   [facility] Jon Billings, NP   6 months ago Insect bite of right lower leg, initial encounter   [facility] Jon Billings, NP      Future Appointments            In 2 weeks Jon Billings, NP [facility] [place]

## 2021-11-30 ENCOUNTER — Ambulatory Visit: Admitting: Nurse Practitioner

## 2021-12-07 ENCOUNTER — Other Ambulatory Visit: Payer: Self-pay | Admitting: Nurse Practitioner

## 2021-12-07 NOTE — Telephone Encounter (Signed)
Requested medication (s) are due for refill today: yes ? ?Requested medication (s) are on the active medication list: yes   ? ?Last refill: 11/08/21  #20  0 refills ? ?Future visit scheduled yes 12/09/21 ? ?Notes to clinic:Not delegated ? ?Requested Prescriptions  ?Pending Prescriptions Disp Refills  ? ondansetron (ZOFRAN-ODT) 4 MG disintegrating tablet [Pharmacy Med Name: ONDANSETRON ODT 4 MG TABLET] 20 tablet 0  ?  Sig: Take 1 tablet (4 mg total) by mouth every 8 (eight) hours as needed for nausea or vomiting.  ?  ? Not Delegated - Gastroenterology: Antiemetics - ondansetron Failed - 12/07/2021  2:54 PM  ?  ?  Failed - This refill cannot be delegated  ?  ?  Passed - AST in normal range and within 360 days  ?  AST  ?Date Value Ref Range Status  ?09/22/2021 17 0 - 40 IU/L Final  ? ?SGOT(AST)  ?Date Value Ref Range Status  ?09/06/2014 < 5 (L) 15 - 37 Unit/L Final  ?  ?  ?  ?  Passed - ALT in normal range and within 360 days  ?  ALT  ?Date Value Ref Range Status  ?09/22/2021 22 0 - 32 IU/L Final  ? ?SGPT (ALT)  ?Date Value Ref Range Status  ?09/06/2014 21 U/L Final  ?  Comment:  ?  14-63 ?NOTE: New Reference Range ?04/28/14 ?  ?  ?  ?  ?  Passed - Valid encounter within last 6 months  ?  Recent Outpatient Visits   ? ?      ? 2 months ago Anxiety and depression  ? [place], NP  ? 3 months ago Anxiety and depression  ? [place], NP  ? 4 months ago Breast abscess  ? [facility] Vigg, Avanti, MD  ? 5 months ago Anxiety and depression  ? [place], NP  ? 7 months ago Insect bite of right lower leg, initial encounter  ? [facility] Jon Billings, NP  ? ?  ?  ?Future Appointments   ? ?        ? In 2 days Jon Billings, NP [facility], [facility]  ? ?  ? ?  ?  ?  ? ? ? ? ?

## 2021-12-09 ENCOUNTER — Ambulatory Visit: Admitting: Nurse Practitioner

## 2021-12-09 NOTE — Progress Notes (Deleted)
? ?There were no vitals taken for this visit.  ? ?Subjective:  ? ? Patient ID: Latasha Lawrence, female    DOB: 05-Feb-1971, 51 y.o.   MRN: [id number] ? ?HPI: ?Latasha Lawrence is a 51 y.o. female ? ?No chief complaint on file. ? ?Patient presents to clinic with complaints of increased urination, increased thirstiness, fatigue, excessive sweating, weight loss.  She has been more irritable and moody along with other symptoms.   ? ? ?Relevant past medical, surgical, family and social history reviewed and updated as indicated. Interim medical history since our last visit reviewed. ?Allergies and medications reviewed and updated. ? ?Review of Systems  ?Constitutional:  Positive for fatigue and unexpected weight change.  ?Endocrine: Positive for cold intolerance, polydipsia and polyuria.  ?Psychiatric/Behavioral:  Positive for agitation.   ? ?Per HPI unless specifically indicated above ? ?   ?Objective:  ?  ?There were no vitals taken for this visit.  ?Wt Readings from Last 3 Encounters:  ?09/22/21 206 lb (93.4 kg)  ?08/25/21 207 lb 6.4 oz (94.1 kg)  ?07/12/21 200 lb (90.7 kg)  ?  ?Physical Exam ?Vitals and nursing note reviewed.  ?Constitutional:   ?   General: She is not in acute distress. ?   Appearance: Normal appearance. She is normal weight. She is not ill-appearing, toxic-appearing or diaphoretic.  ?HENT:  ?   Head: Normocephalic.  ?   Right Ear: External ear normal.  ?   Left Ear: External ear normal.  ?   Nose: Nose normal.  ?   Mouth/Throat:  ?   Mouth: Mucous membranes are moist.  ?   Pharynx: Oropharynx is clear.  ?Eyes:  ?   General:     ?   Right eye: No discharge.     ?   Left eye: No discharge.  ?   Extraocular Movements: Extraocular movements intact.  ?   Conjunctiva/sclera: Conjunctivae normal.  ?   Pupils: Pupils are equal, round, and reactive to light.  ?Cardiovascular:  ?   Rate and Rhythm: Normal rate and regular rhythm.  ?   Heart sounds: No murmur heard. ?Pulmonary:  ?   Effort: Pulmonary effort is  normal. No respiratory distress.  ?   Breath sounds: Normal breath sounds. No wheezing or rales.  ?Musculoskeletal:  ?   Cervical back: Normal range of motion and neck supple.  ?Skin: ?   General: Skin is warm and dry.  ?   Capillary Refill: Capillary refill takes less than 2 seconds.  ?Neurological:  ?   General: No focal deficit present.  ?   Mental Status: She is alert and oriented to person, place, and time. Mental status is at baseline.  ?Psychiatric:     ?   Mood and Affect: Mood normal.     ?   Behavior: Behavior normal.     ?   Thought Content: Thought content normal.     ?   Judgment: Judgment normal.  ? ? ?Results for orders placed or performed in visit on 09/22/21  ?HgB A1c  ?Result Value Ref Range  ? Hgb A1c MFr Bld 5.4 4.8 - 5.6 %  ? Est. average glucose Bld gHb Est-mCnc 108 mg/dL  ?Comp Met (CMET)  ?Result Value Ref Range  ? Glucose 115 (H) 70 - 99 mg/dL  ? BUN 13 6 - 24 mg/dL  ? Creatinine, Ser 0.94 0.57 - 1.00 mg/dL  ? eGFR 75 >59 mL/min/1.73  ? BUN/Creatinine Ratio 14 9 -  23  ? Sodium 138 134 - 144 mmol/L  ? Potassium 4.3 3.5 - 5.2 mmol/L  ? Chloride 95 (L) 96 - 106 mmol/L  ? CO2 26 20 - 29 mmol/L  ? Calcium 9.8 8.7 - 10.2 mg/dL  ? Total Protein 7.6 6.0 - 8.5 g/dL  ? Albumin 4.8 3.8 - 4.8 g/dL  ? Globulin, Total 2.8 1.5 - 4.5 g/dL  ? Albumin/Globulin Ratio 1.7 1.2 - 2.2  ? Bilirubin Total 0.4 0.0 - 1.2 mg/dL  ? Alkaline Phosphatase 85 44 - 121 IU/L  ? AST 17 0 - 40 IU/L  ? ALT 22 0 - 32 IU/L  ?Rheumatoid factor  ?Result Value Ref Range  ? Rhuematoid fact SerPl-aCnc <10.0 <14.0 IU/mL  ?ANA  ?Result Value Ref Range  ? Anti Nuclear Antibody (ANA) Negative Negative  ?ANA+ENA+DNA/DS+Antich+Centr  ?Result Value Ref Range  ? ANA Titer 1 Negative   ? dsDNA Ab <1 0 - 9 IU/mL  ? ENA RNP Ab 0.3 0.0 - 0.9 AI  ? ENA SM Ab Ser-aCnc <0.2 0.0 - 0.9 AI  ? Scleroderma (Scl-70) (ENA) Antibody, IgG <0.2 0.0 - 0.9 AI  ? ENA SSA (RO) Ab <0.2 0.0 - 0.9 AI  ? ENA SSB (LA) Ab <0.2 0.0 - 0.9 AI  ? Chromatin Ab SerPl-aCnc  <0.2 0.0 - 0.9 AI  ? Anti JO-1 <0.2 0.0 - 0.9 AI  ? Centromere Ab Screen <0.2 0.0 - 0.9 AI  ? See below: Comment   ?CK  ?Result Value Ref Range  ? Total CK 56 32 - 182 U/L  ?Uric acid  ?Result Value Ref Range  ? Uric Acid 4.5 2.6 - 6.2 mg/dL  ?Ehrlichia antibody panel  ?Result Value Ref Range  ? E.Chaffeensis (HME) IgG Negative Neg:<1:64  ? E. Chaffeensis (HME) IgM Titer Negative Neg:<1:20  ? HGE IgG Titer Negative Neg:<1:64  ? HGE IgM Titer Negative Neg:<1:20  ?Babesia microti Antibody Panel  ?Result Value Ref Range  ? Babesia microti IgM <1:10 Neg:<1:10  ? Babesia microti IgG <1:10 Neg:<1:10  ?CBC with Differential/Platelet  ?Result Value Ref Range  ? WBC 9.8 3.4 - 10.8 x10E3/uL  ? RBC 5.45 (H) 3.77 - 5.28 x10E6/uL  ? Hemoglobin 15.9 11.1 - 15.9 g/dL  ? Hematocrit 46.7 (H) 34.0 - 46.6 %  ? MCV 86 79 - 97 fL  ? MCH 29.2 26.6 - 33.0 pg  ? MCHC 34.0 31.5 - 35.7 g/dL  ? RDW 13.5 11.7 - 15.4 %  ? Platelets 319 150 - 450 x10E3/uL  ? Neutrophils 78 Not Estab. %  ? Lymphs 16 Not Estab. %  ? Monocytes 5 Not Estab. %  ? Eos 1 Not Estab. %  ? Basos 0 Not Estab. %  ? Neutrophils Absolute 7.6 (H) 1.4 - 7.0 x10E3/uL  ? Lymphocytes Absolute 1.5 0.7 - 3.1 x10E3/uL  ? Monocytes Absolute 0.5 0.1 - 0.9 x10E3/uL  ? EOS (ABSOLUTE) 0.1 0.0 - 0.4 x10E3/uL  ? Basophils Absolute 0.0 0.0 - 0.2 x10E3/uL  ? Immature Granulocytes 0 Not Estab. %  ? Immature Grans (Abs) 0.0 0.0 - 0.1 x10E3/uL  ?B12  ?Result Value Ref Range  ? Vitamin B-12 470 232 - 1,245 pg/mL  ? ?   ?Assessment & Plan:  ? ?Problem List Items Addressed This Visit   ?None ?  ? ?Follow up plan: ?No follow-ups on file. ? ? ? ? ? ? ?

## 2021-12-20 ENCOUNTER — Ambulatory Visit: Admitting: Nurse Practitioner

## 2021-12-20 NOTE — Progress Notes (Deleted)
? ?There were no vitals taken for this visit.  ? ?Subjective:  ? ? Patient ID: Latasha Lawrence, female    DOB: 1970-12-17, 51 y.o.   MRN: [id number] ? ?HPI: ?Latasha Lawrence is a 51 y.o. female ? ?No chief complaint on file. ? ?UPPER RESPIRATORY TRACT INFECTION ?Worst symptom: ?Fever: {Blank single:19197::"yes","no"} ?Cough: {Blank single:19197::"yes","no"} ?Shortness of breath: {Blank single:19197::"yes","no"} ?Wheezing: {Blank single:19197::"yes","no"} ?Chest pain: {Blank single:19197::"yes","no","yes, with cough"} ?Chest tightness: {Blank single:19197::"yes","no"} ?Chest congestion: {Blank single:19197::"yes","no"} ?Nasal congestion: {Blank single:19197::"yes","no"} ?Runny nose: {Blank single:19197::"yes","no"} ?Post nasal drip: {Blank single:19197::"yes","no"} ?Sneezing: {Blank single:19197::"yes","no"} ?Sore throat: {Blank single:19197::"yes","no"} ?Swollen glands: {Blank single:19197::"yes","no"} ?Sinus pressure: {Blank single:19197::"yes","no"} ?Headache: {Blank single:19197::"yes","no"} ?Face pain: {Blank single:19197::"yes","no"} ?Toothache: {Blank single:19197::"yes","no"} ?Ear pain: {Blank single:19197::"yes","no"} {Blank single:19197::""right","left", "bilateral"} ?Ear pressure: {Blank single:19197::"yes","no"} {Blank single:19197::""right","left", "bilateral"} ?Eyes red/itching:{Blank single:19197::"yes","no"} ?Eye drainage/crusting: {Blank single:19197::"yes","no"}  ?Vomiting: {Blank single:19197::"yes","no"} ?Rash: {Blank single:19197::"yes","no"} ?Fatigue: {Blank single:19197::"yes","no"} ?Sick contacts: {Blank single:19197::"yes","no"} ?Strep contacts: {Blank single:19197::"yes","no"}  ?Context: {Blank multiple:19196::"better","worse","stable","fluctuating"} ?Recurrent sinusitis: {Blank single:19197::"yes","no"} ?Relief with OTC cold/cough medications: {Blank single:19197::"yes","no"}  ?Treatments attempted: {Blank multiple:19196::"none","cold/sinus","mucinex","anti-histamine","pseudoephedrine","cough  syrup","antibiotics"}  ? ?Relevant past medical, surgical, family and social history reviewed and updated as indicated. Interim medical history since our last visit reviewed. ?Allergies and medications reviewed and updated. ? ?Review of Systems ? ?Per HPI unless specifically indicated above ? ?   ?Objective:  ?  ?There were no vitals taken for this visit.  ?Wt Readings from Last 3 Encounters:  ?09/22/21 206 lb (93.4 kg)  ?08/25/21 207 lb 6.4 oz (94.1 kg)  ?07/12/21 200 lb (90.7 kg)  ?  ?Physical Exam ? ?Results for orders placed or performed in visit on 09/22/21  ?HgB A1c  ?Result Value Ref Range  ? Hgb A1c MFr Bld 5.4 4.8 - 5.6 %  ? Est. average glucose Bld gHb Est-mCnc 108 mg/dL  ?Comp Met (CMET)  ?Result Value Ref Range  ? Glucose 115 (H) 70 - 99 mg/dL  ? BUN 13 6 - 24 mg/dL  ? Creatinine, Ser 0.94 0.57 - 1.00 mg/dL  ? eGFR 75 >59 mL/min/1.73  ? BUN/Creatinine Ratio 14 9 - 23  ? Sodium 138 134 - 144 mmol/L  ? Potassium 4.3 3.5 - 5.2 mmol/L  ? Chloride 95 (L) 96 - 106 mmol/L  ? CO2 26 20 - 29 mmol/L  ? Calcium 9.8 8.7 - 10.2 mg/dL  ? Total Protein 7.6 6.0 - 8.5 g/dL  ? Albumin 4.8 3.8 - 4.8 g/dL  ? Globulin, Total 2.8 1.5 - 4.5 g/dL  ? Albumin/Globulin Ratio 1.7 1.2 - 2.2  ? Bilirubin Total 0.4 0.0 - 1.2 mg/dL  ? Alkaline Phosphatase 85 44 - 121 IU/L  ? AST 17 0 - 40 IU/L  ? ALT 22 0 - 32 IU/L  ?Rheumatoid factor  ?Result Value Ref Range  ? Rhuematoid fact SerPl-aCnc <10.0 <14.0 IU/mL  ?ANA  ?Result Value Ref Range  ? Anti Nuclear Antibody (ANA) Negative Negative  ?ANA+ENA+DNA/DS+Antich+Centr  ?Result Value Ref Range  ? ANA Titer 1 Negative   ? dsDNA Ab <1 0 - 9 IU/mL  ? ENA RNP Ab 0.3 0.0 - 0.9 AI  ? ENA SM Ab Ser-aCnc <0.2 0.0 - 0.9 AI  ? Scleroderma (Scl-70) (ENA) Antibody, IgG <0.2 0.0 - 0.9 AI  ? ENA SSA (RO) Ab <0.2 0.0 - 0.9 AI  ? ENA SSB (LA) Ab <0.2 0.0 - 0.9 AI  ? Chromatin Ab SerPl-aCnc <0.2 0.0 - 0.9 AI  ? Anti JO-1 <0.2 0.0 - 0.9 AI  ? Centromere Ab  Screen <0.2 0.0 - 0.9 AI  ? See below: Comment   ?CK   ?Result Value Ref Range  ? Total CK 56 32 - 182 U/L  ?Uric acid  ?Result Value Ref Range  ? Uric Acid 4.5 2.6 - 6.2 mg/dL  ?Ehrlichia antibody panel  ?Result Value Ref Range  ? E.Chaffeensis (HME) IgG Negative Neg:<1:64  ? E. Chaffeensis (HME) IgM Titer Negative Neg:<1:20  ? HGE IgG Titer Negative Neg:<1:64  ? HGE IgM Titer Negative Neg:<1:20  ?Babesia microti Antibody Panel  ?Result Value Ref Range  ? Babesia microti IgM <1:10 Neg:<1:10  ? Babesia microti IgG <1:10 Neg:<1:10  ?CBC with Differential/Platelet  ?Result Value Ref Range  ? WBC 9.8 3.4 - 10.8 x10E3/uL  ? RBC 5.45 (H) 3.77 - 5.28 x10E6/uL  ? Hemoglobin 15.9 11.1 - 15.9 g/dL  ? Hematocrit 46.7 (H) 34.0 - 46.6 %  ? MCV 86 79 - 97 fL  ? MCH 29.2 26.6 - 33.0 pg  ? MCHC 34.0 31.5 - 35.7 g/dL  ? RDW 13.5 11.7 - 15.4 %  ? Platelets 319 150 - 450 x10E3/uL  ? Neutrophils 78 Not Estab. %  ? Lymphs 16 Not Estab. %  ? Monocytes 5 Not Estab. %  ? Eos 1 Not Estab. %  ? Basos 0 Not Estab. %  ? Neutrophils Absolute 7.6 (H) 1.4 - 7.0 x10E3/uL  ? Lymphocytes Absolute 1.5 0.7 - 3.1 x10E3/uL  ? Monocytes Absolute 0.5 0.1 - 0.9 x10E3/uL  ? EOS (ABSOLUTE) 0.1 0.0 - 0.4 x10E3/uL  ? Basophils Absolute 0.0 0.0 - 0.2 x10E3/uL  ? Immature Granulocytes 0 Not Estab. %  ? Immature Grans (Abs) 0.0 0.0 - 0.1 x10E3/uL  ?B12  ?Result Value Ref Range  ? Vitamin B-12 470 232 - 1,245 pg/mL  ? ?   ?Assessment & Plan:  ? ?Problem List Items Addressed This Visit   ?None ?  ? ?Follow up plan: ?No follow-ups on file. ? ? ? ? ? ?

## 2021-12-25 ENCOUNTER — Emergency Department
Admission: EM | Admit: 2021-12-25 | Discharge: 2021-12-25 | Disposition: A | Discharge status: Home / Self Care | Attending: Emergency Medicine | Admitting: Emergency Medicine

## 2021-12-25 ENCOUNTER — Other Ambulatory Visit: Payer: Self-pay

## 2021-12-25 DIAGNOSIS — I10 Essential (primary) hypertension: Secondary | ICD-10-CM | POA: Insufficient documentation

## 2021-12-25 DIAGNOSIS — X503XXA Overexertion from repetitive movements, initial encounter: Secondary | ICD-10-CM | POA: Insufficient documentation

## 2021-12-25 DIAGNOSIS — S46911A Strain of unspecified muscle, fascia and tendon at shoulder and upper arm level, right arm, initial encounter: Secondary | ICD-10-CM | POA: Diagnosis not present

## 2021-12-25 DIAGNOSIS — S4991XA Unspecified injury of right shoulder and upper arm, initial encounter: Secondary | ICD-10-CM | POA: Diagnosis present

## 2021-12-25 MED ORDER — ORPHENADRINE CITRATE ER 100 MG PO TB12: 100.0000 mg | ORAL_TABLET | Freq: Two times a day (BID) | ORAL | 0 refills | Status: DC

## 2021-12-25 MED ORDER — LIDOCAINE 5 % EX PTCH
1.0000 | MEDICATED_PATCH | CUTANEOUS | Status: DC
  Administered 2021-12-25: 1 via TRANSDERMAL
  Filled 2021-12-25: qty 1

## 2021-12-25 MED ORDER — OXYCODONE-ACETAMINOPHEN 7.5-325 MG PO TABS: 1.0000 | ORAL_TABLET | Freq: Four times a day (QID) | ORAL | 0 refills | Status: DC | PRN

## 2021-12-25 NOTE — ED Provider Notes (Signed)
? ?[facility] ?Provider Note ? ? ? Event Date/Time  ? First MD Initiated Contact with Patient 12/25/21 1556   ?  (approximate) ? ? ?History  ? ?Shoulder Pain ? ? ?HPI ?Hypertension, GERD, and insomnia. ?YIZEL CANBY is a 51 y.o. female patient presents with posterior upper right shoulder pain for 4 days.  Patient stated the past week she has performing repetitive lifting and overhead reaching.  Patient is left-hand dominant.  Patient states pain with all movements of the right shoulder.  Denies loss of sensation.  Rates pain as a 9/10.  No relief with conservative measures consist of over-the-counter anti-inflammatories and massage. ? ? ?Physical Exam  ? ?Triage Vital Signs: ?ED Triage Vitals  ?Enc Vitals Group  ?   BP 12/25/21 1536 (!) 147/117  ?   Pulse Rate 12/25/21 1536 (!) 108  ?   Resp 12/25/21 1536 18  ?   Temp 12/25/21 1536 98.8 ?F (37.1 ?C)  ?   Temp Source 12/25/21 1536 Oral  ?   SpO2 12/25/21 1536 96 %  ?   Weight --   ?   Height --   ?   Head Circumference --   ?   Peak Flow --   ?   Pain Score 12/25/21 1539 9  ?   Pain Loc --   ?   Pain Edu? --   ?   Excl. in GC? --   ? ? ?Most recent vital signs: ?Vitals:  ? 12/25/21 1536  ?BP: (!) 147/117  ?Pulse: (!) 108  ?Resp: 18  ?Temp: 98.8 ?F (37.1 ?C)  ?SpO2: 96%  ? ? ? ?General: Awake, no distress.  ?CV:  Good peripheral perfusion.  ?Resp:  Normal effort.  ?Abd:  No distention.  ?Other:  No obvious deformity to the right shoulder.  Decreased range of motion is all fields.  Moderate guarding palpation the medial aspect superior scapular muscle area. ? ? ?ED Results / Procedures / Treatments  ? ?Labs ?(all labs ordered are listed, but only abnormal results are displayed) ?Labs Reviewed - No data to display ? ? ?EKG ? ? ? ? ?RADIOLOGY ? ? ? ? ?PROCEDURES: ? ?Critical Care performed: No ? ?Procedures ? ? ?MEDICATIONS ORDERED IN ED: ?Medications  ?lidocaine (LIDODERM) 5 % 1 patch (1 patch Transdermal Patch Applied 12/25/21 1612)   ? ? ?Lidoderm patch applied for pain in ED. ?IMPRESSION / MDM / ASSESSMENT AND PLAN / ED COURSE  ?I reviewed the triage vital signs and the nursing notes. ?             ?               ? ?Differential diagnosis includes, but is not limited to, scapular muscle strain, myalgia, and arthralgia. ? ? ? ?FINAL CLINICAL IMPRESSION(S) / ED DIAGNOSES  ? ?Final diagnoses:  ?Muscle strain of right scapular region, initial encounter  ? ? ? ?Rx / DC Orders  ? ?ED Discharge Orders   ? ?      Ordered  ?  orphenadrine (NORFLEX) 100 MG tablet  2 times daily       ? 12/25/21 1618  ?  oxyCODONE-acetaminophen (PERCOCET) 7.5-325 MG tablet  Every 6 hours PRN       ? 12/25/21 1618  ? ?  ?  ? ?  ? ? ? ?Note:  This document was prepared using Dragon voice recognition software and may include unintentional dictation errors. ? ?  ?Katrinka Blazing,  Arther Abbott, PA-C ?12/25/21 1619 ? ?  ?Georga Hacking, MD ?12/25/21 1811 ? ?

## 2021-12-25 NOTE — ED Notes (Signed)
Pt to ED for R shoulder that started about 4 days ago after heavy lifting (works in [employer]). Pain started next day. Was told to rest and get seen at ER if pain did not get better after 2-3 days of rest.  ?States R shoulder pain has gotten worse, now feeling in neck, states pain is worse with forward or backward motion of arm.  ?Pain is 9/10. Full rom in fingers, normal cap refill. ?

## 2021-12-25 NOTE — ED Triage Notes (Signed)
Right shoulder pain 4 days ago ?

## 2021-12-25 NOTE — Discharge Instructions (Signed)
Wear the Lidoderm patch for 12 hours.  Take medication as directed.  Advised to wear sling for 2 to 3 days as needed.  Follow-up with family doctor if no improvement in 3 days.  Be advised medication may cause drowsiness. ?

## 2021-12-26 ENCOUNTER — Emergency Department

## 2021-12-26 ENCOUNTER — Other Ambulatory Visit: Payer: Self-pay

## 2021-12-26 ENCOUNTER — Emergency Department
Admission: EM | Admit: 2021-12-26 | Discharge: 2021-12-26 | Disposition: A | Discharge status: Home / Self Care | Attending: Emergency Medicine | Admitting: Emergency Medicine

## 2021-12-26 DIAGNOSIS — M25519 Pain in unspecified shoulder: Secondary | ICD-10-CM | POA: Diagnosis not present

## 2021-12-26 DIAGNOSIS — R791 Abnormal coagulation profile: Secondary | ICD-10-CM | POA: Insufficient documentation

## 2021-12-26 DIAGNOSIS — M542 Cervicalgia: Secondary | ICD-10-CM | POA: Insufficient documentation

## 2021-12-26 DIAGNOSIS — M79602 Pain in left arm: Secondary | ICD-10-CM | POA: Insufficient documentation

## 2021-12-26 DIAGNOSIS — I10 Essential (primary) hypertension: Secondary | ICD-10-CM | POA: Insufficient documentation

## 2021-12-26 DIAGNOSIS — F17200 Nicotine dependence, unspecified, uncomplicated: Secondary | ICD-10-CM | POA: Insufficient documentation

## 2021-12-26 DIAGNOSIS — M546 Pain in thoracic spine: Secondary | ICD-10-CM | POA: Diagnosis not present

## 2021-12-26 DIAGNOSIS — R0781 Pleurodynia: Secondary | ICD-10-CM | POA: Diagnosis not present

## 2021-12-26 LAB — CBC WITH DIFFERENTIAL/PLATELET
Abs Immature Granulocytes: 0.06 10*3/uL (ref 0.00–0.07)
Basophils Absolute: 0.1 10*3/uL (ref 0.0–0.1)
Basophils Relative: 0 %
Eosinophils Absolute: 0.1 10*3/uL (ref 0.0–0.5)
Eosinophils Relative: 1 %
HCT: 51.1 % — ABNORMAL HIGH (ref 36.0–46.0)
Hemoglobin: 17.2 g/dL — ABNORMAL HIGH (ref 12.0–15.0)
Immature Granulocytes: 1 %
Lymphocytes Relative: 18 %
Lymphs Abs: 2.2 10*3/uL (ref 0.7–4.0)
MCH: 29.2 pg (ref 26.0–34.0)
MCHC: 33.7 g/dL (ref 30.0–36.0)
MCV: 86.6 fL (ref 80.0–100.0)
Monocytes Absolute: 0.6 10*3/uL (ref 0.1–1.0)
Monocytes Relative: 5 %
Neutro Abs: 9 10*3/uL — ABNORMAL HIGH (ref 1.7–7.7)
Neutrophils Relative %: 75 %
Platelets: 327 10*3/uL (ref 150–400)
RBC: 5.9 MIL/uL — ABNORMAL HIGH (ref 3.87–5.11)
RDW: 13.1 % (ref 11.5–15.5)
WBC: 12.1 10*3/uL — ABNORMAL HIGH (ref 4.0–10.5)
nRBC: 0 % (ref 0.0–0.2)

## 2021-12-26 LAB — COMPREHENSIVE METABOLIC PANEL
ALT: 20 U/L (ref 0–44)
AST: 19 U/L (ref 15–41)
Albumin: 4.5 g/dL (ref 3.5–5.0)
Alkaline Phosphatase: 66 U/L (ref 38–126)
Anion gap: 12 (ref 5–15)
BUN: 14 mg/dL (ref 6–20)
CO2: 29 mmol/L (ref 22–32)
Calcium: 10 mg/dL (ref 8.9–10.3)
Chloride: 98 mmol/L (ref 98–111)
Creatinine, Ser: 0.93 mg/dL (ref 0.44–1.00)
GFR, Estimated: >60 mL/min (ref >60)
Glucose, Bld: 109 mg/dL — ABNORMAL HIGH (ref 70–99)
Potassium: 3.1 mmol/L — ABNORMAL LOW (ref 3.5–5.1)
Sodium: 139 mmol/L (ref 135–145)
Total Bilirubin: 0.8 mg/dL (ref 0.3–1.2)
Total Protein: 8.4 g/dL — ABNORMAL HIGH (ref 6.5–8.1)

## 2021-12-26 LAB — D-DIMER, QUANTITATIVE: D-Dimer, Quant: 2.06 ug/mL-FEU — ABNORMAL HIGH (ref 0.00–0.50)

## 2021-12-26 LAB — TROPONIN I (HIGH SENSITIVITY): Troponin I (High Sensitivity): 5 ng/L (ref <18)

## 2021-12-26 LAB — LIPASE, BLOOD: Lipase: 33 U/L (ref 11–51)

## 2021-12-26 MED ORDER — METHOCARBAMOL 500 MG PO TABS
500.0000 mg | ORAL_TABLET | Freq: Three times a day (TID) | ORAL | 0 refills | Status: AC | PRN
Start: 2021-12-26 — End: 2021-12-31

## 2021-12-26 MED ORDER — PREDNISONE 20 MG PO TABS
60.0000 mg | ORAL_TABLET | Freq: Once | ORAL | Status: AC
Start: 2021-12-26 — End: 2021-12-26
  Administered 2021-12-26: 60 mg via ORAL
  Filled 2021-12-26: qty 3

## 2021-12-26 MED ORDER — IOHEXOL 350 MG/ML SOLN
75.0000 mL | Freq: Once | INTRAVENOUS | Status: AC | PRN
  Administered 2021-12-26: 75 mL via INTRAVENOUS
  Filled 2021-12-26: qty 75

## 2021-12-26 MED ORDER — ACETAMINOPHEN 325 MG PO TABS
650.0000 mg | ORAL_TABLET | Freq: Once | ORAL | Status: DC
  Filled 2021-12-26: qty 2

## 2021-12-26 MED ORDER — METHOCARBAMOL 500 MG PO TABS
1000.0000 mg | ORAL_TABLET | Freq: Once | ORAL | Status: AC
  Administered 2021-12-26: 1000 mg via ORAL
  Filled 2021-12-26: qty 2

## 2021-12-26 MED ORDER — PREDNISONE 10 MG (21) PO TBPK: ORAL_TABLET | ORAL | 0 refills | Status: DC

## 2021-12-26 NOTE — ED Provider Notes (Signed)
? ?[facility] ?Provider Note ? ?Patient Contact: 7:41 PM (approximate) ? ? ?History  ? ?Shoulder Pain ? ? ?HPI ? ?Latasha FAILLA is a 51 y.o. 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  Patient also states that the pain radiates along her right upper extremity.  She states that pain is not reproducible when she moves her neck.  She was seen and evaluated 1 day ago and was diagnosed with a muscle spasm and was told to return to the emergency department if her pain seems to be worsening.  Patient is a daily smoker.  She denies recent travel, prolonged immobilization or recent surgery. ? ?  ? ? ?Physical Exam  ? ?Triage Vital Signs: ?ED Triage Vitals [12/26/21 1922]  ?Enc Vitals Group  ?   BP (!) 168/102  ?   Pulse Rate 90  ?   Resp 20  ?   Temp 99.8 ?F (37.7 ?C)  ?   Temp Source Oral  ?   SpO2 98 %  ?   Weight 200 lb (90.7 kg)  ?   Height 5\' 6"  (1.676 m)  ?   Head Circumference   ?   Peak Flow   ?   Pain Score 10  ?   Pain Loc   ?   Pain Edu?   ?   Excl. in GC?   ? ? ?Most recent vital signs: ?Vitals:  ? 12/26/21 1922  ?BP: (!) 168/102  ?Pulse: 90  ?Resp: 20  ?Temp: 99.8 ?F (37.7 ?C)  ?SpO2: 98%  ? ? ? ?General: Alert and in no acute distress. ?Eyes:  PERRL. EOMI. ?Head: No acute traumatic findings ?ENT: ?     Ears: Tms are pearly.  ?     Nose: No congestion/rhinnorhea. ?     Mouth/Throat: Mucous membranes are moist. ?Neck: No stridor. No cervical spine tenderness to palpation. ?Cardiovascular:  Good peripheral perfusion.  Chest wall pain is not reproducible to palpation. ?Respiratory: Normal respiratory effort without tachypnea or retractions. Lungs CTAB. Good air entry to the bases with no decreased or absent breath sounds. ?Gastrointestinal: Bowel sounds ?4 quadrants. Soft and nontender to palpation.  No guarding or rigidity. No palpable masses. No distention. No CVA tenderness. ?Musculoskeletal: Full range of motion to all extremities.  ?Neurologic:  No gross focal neurologic deficits are appreciated.  ?Skin:   No rash noted ?Other: ? ? ?ED Results / Procedures / Treatments  ? ?Labs ?(all labs ordered are listed, but only abnormal results are displayed) ?Labs Reviewed  ?CBC WITH DIFFERENTIAL/PLATELET - Abnormal; Notable for the following components:  ?    Result Value  ? WBC 12.1 (*)   ? RBC 5.90 (*)   ? Hemoglobin 17.2 (*)   ? HCT 51.1 (*)   ? Neutro Abs 9.0 (*)   ? All other components within normal limits  ?COMPREHENSIVE METABOLIC PANEL - Abnormal; Notable for the following components:  ? Potassium 3.1 (*)   ? Glucose, Bld 109 (*)   ? Total Protein 8.4 (*)   ? All other components within normal limits  ?D-DIMER, QUANTITATIVE - Abnormal; Notable for the following components:  ? D-Dimer, Quant 2.06 (*)   ? All other components within normal limits  ?LIPASE, BLOOD  ?TROPONIN I (HIGH SENSITIVITY)  ?TROPONIN I (HIGH SENSITIVITY)  ? ? ? ?  EKG ? ? ? ? ?RADIOLOGY ? ?I personally viewed and evaluated these images as part of my medical decision making, as well as reviewing the written report by the radiologist. ? ?ED Provider Interpretation: I personally reviewed CT angio chest and agree with radiologist interpretation.  No evidence of PE. ? ? ?PROCEDURES: ? ?Critical Care performed: No ? ?Procedures ? ? ?MEDICATIONS ORDERED IN ED: ?Medications  ?acetaminophen (TYLENOL) tablet 650 mg (650 mg Oral Not Given 12/26/21 2027)  ?methocarbamol (ROBAXIN) tablet 1,000 mg (has no administration in time range)  ?predniSONE (DELTASONE) tablet 60 mg (has no administration in time range)  ?iohexol (OMNIPAQUE) 350 MG/ML injection 75 mL (75 mLs Intravenous Contrast Given 12/26/21 2059)  ? ? ? ?IMPRESSION / MDM / ASSESSMENT AND PLAN / ED COURSE  ?I reviewed the triage vital signs and the nursing notes. ?             ?                ? ?Differential diagnosis includes, but is not limited to, PE, cervical radiculopathy... ? ?Assessment and plan ?51 year old female presents to the emergency department with left arm pain, left upper back pain and pleuritic chest pain. ? ?Patient was hypertensive at triage but vital signs were otherwise reassuring.  Patient was alert, active and nontoxic-appearing on exam with good breath sounds in the lung bases bilaterally. ? ?Basic labs were obtained including D-dimer which was elevated in the emergency department.  Troponin in range.  EKG indicated normal sinus rhythm without ST segment elevation or other apparent arrhythmia. ? ?CTA was obtained which showed no evidence of PE or other acute abnormality. ? ?I did obtain an x-ray of the cervical spine which did show some degenerative changes at C5-C6 but no other acute abnormalities.  I personally reviewed this x-ray and agree with radiologist interpretation.. ? ?Suspect cervical radiculopathy with muscle spasm at this time.  We will treat with tapered prednisone and Robaxin.  I reviewed the [place] drug database and stated patient had a prior prescription for Percocet recently and in the recent past, Suboxone. ? ? ?FINAL CLINICAL IMPRESSION(S) / ED DIAGNOSES  ? ?Final diagnoses:  ?Neck pain  ? ? ? ?Rx / DC Orders  ? ?ED Discharge Orders   ? ?      Ordered  ?  predniSONE (STERAPRED UNI-PAK 21 TAB) 10 MG (21) TBPK tablet       ? 12/26/21 2200  ?  methocarbamol (ROBAXIN) 500 MG tablet  Every 8 hours PRN       ? 12/26/21 2200  ? ?  ?  ? ?  ? ? ? ?Note:  This document was prepared using Dragon voice recognition software and may include unintentional dictation errors. ?  ?Orvil Feil, PA-C ?12/26/21 2206 ? ?  ?Phineas Semen, MD ?12/26/21 2242 ? ?

## 2021-12-26 NOTE — ED Notes (Signed)
Patient to CT at this time

## 2021-12-26 NOTE — Discharge Instructions (Signed)
Take tapered steroid as directed.  You can take Robaxin for muscle spasms.  

## 2021-12-26 NOTE — ED Triage Notes (Signed)
Pt presents to ER c/o right shoulder pain that started 5 days ago and was seen here for yesterday and states she was told to come back if pain became worse.  Pt states pain is "everywhere in my upper body right now."  Pt is A&O x4 at this time in NAD.   ?

## 2021-12-27 ENCOUNTER — Ambulatory Visit: Admitting: Internal Medicine

## 2022-01-30 ENCOUNTER — Other Ambulatory Visit: Payer: Self-pay | Admitting: Nurse Practitioner

## 2022-02-01 NOTE — Telephone Encounter (Signed)
Requested Prescriptions  ?Pending Prescriptions Disp Refills  ?? traZODone (DESYREL) 100 MG tablet [Pharmacy Med Name: TRAZODONE 100 MG TABLET] 180 tablet 0  ?  Sig: Take 2 tablets (200 mg total) by mouth at bedtime as needed for sleep.  ?  ? Psychiatry: Antidepressants - Serotonin Modulator Passed - 01/30/2022  6:42 PM  ?  ?  Passed - Completed PHQ-2 or PHQ-9 in the last 360 days  ?  ?  Passed - Valid encounter within last 6 months  ?  Recent Outpatient Visits   ?      ? 4 months ago Anxiety and depression  ? [facility] Larae Grooms, NP  ? 5 months ago Anxiety and depression  ? [facility] Larae Grooms, NP  ? 6 months ago Breast abscess  ? [facility] Vigg, Avanti, MD  ? 7 months ago Anxiety and depression  ? [facility] Larae Grooms, NP  ? 8 months ago Insect bite of right lower leg, initial encounter  ? [facility] Larae Grooms, NP  ?  ?  ? ?  ?  ?  ?? hydrOXYzine (VISTARIL) 100 MG capsule [Pharmacy Med Name: HYDROXYZINE PAM 100 MG CAP] 90 capsule 0  ?  Sig: Take 1 capsule (100 mg total) by mouth 3 (three) times daily as needed.  ?  ? Ear, Nose, and Throat:  Antihistamines 2 Passed - 01/30/2022  6:42 PM  ?  ?  Passed - Cr in normal range and within 360 days  ?  Creatinine  ?Date Value Ref Range Status  ?09/06/2014 0.97 0.60 - 1.30 mg/dL Final  ? ?Creatinine, Ser  ?Date Value Ref Range Status  ?12/26/2021 0.93 0.44 - 1.00 mg/dL Final  ?   ?  ?  Passed - Valid encounter within last 12 months  ?  Recent Outpatient Visits   ?      ? 4 months ago Anxiety and depression  ? [facility] Larae Grooms, NP  ? 5 months ago Anxiety and depression  ? [facility] Larae Grooms, NP  ? 6 months ago Breast abscess  ? [facility] Vigg, Avanti, MD  ? 7 months ago Anxiety and depression  ? [facility] Larae Grooms, NP  ? 8 months ago Insect bite of right lower leg, initial encounter  ?  [facility] Larae Grooms, NP  ?  ?  ? ?  ?  ?  ? ? ?

## 2022-02-13 ENCOUNTER — Ambulatory Visit: Payer: Self-pay

## 2022-02-13 ENCOUNTER — Encounter: Payer: Self-pay | Admitting: Physician Assistant

## 2022-02-13 ENCOUNTER — Other Ambulatory Visit: Payer: Self-pay | Admitting: Physician Assistant

## 2022-02-13 ENCOUNTER — Ambulatory Visit (INDEPENDENT_AMBULATORY_CARE_PROVIDER_SITE_OTHER): Admitting: Physician Assistant

## 2022-02-13 ENCOUNTER — Ambulatory Visit
Admission: RE | Admit: 2022-02-13 | Discharge: 2022-02-13 | Disposition: A | Discharge status: Home / Self Care | Source: Ambulatory Visit | Attending: Physician Assistant | Admitting: Physician Assistant

## 2022-02-13 VITALS — BP 137/84 | HR 66 | Temp 98.1°F | Ht 65.98 in | Wt 210.8 lb

## 2022-02-13 DIAGNOSIS — R059 Cough, unspecified: Secondary | ICD-10-CM

## 2022-02-13 DIAGNOSIS — K047 Periapical abscess without sinus: Secondary | ICD-10-CM | POA: Diagnosis not present

## 2022-02-13 DIAGNOSIS — R519 Headache, unspecified: Secondary | ICD-10-CM

## 2022-02-13 DIAGNOSIS — R0602 Shortness of breath: Secondary | ICD-10-CM

## 2022-02-13 MED ORDER — CEPHALEXIN 500 MG PO CAPS: 500.0000 mg | ORAL_CAPSULE | Freq: Four times a day (QID) | ORAL | 0 refills | Status: DC

## 2022-02-13 MED ORDER — CLINDAMYCIN HCL 150 MG PO CAPS: 450.0000 mg | ORAL_CAPSULE | Freq: Three times a day (TID) | ORAL | 0 refills | Status: AC

## 2022-02-13 NOTE — Progress Notes (Signed)
? ? ? ?     Established Patient Office Visit ? ?Name: Latasha Lawrence   MRN: [id number]    DOB: Aug 01, 1971   Date:02/14/2022 ? ?Today's Provider: Jacquelin HawkingErin Allyse Fregeau, MHS, PA-C ?Introduced myself to the patient as a Secondary school teacherA-C and provided education on APPs in clinical practice.  ? ?      ?Subjective ? ?Chief Complaint ? ?Chief Complaint  ?Patient presents with  ? Facial Swelling  ?  Started about a week ago. See a dentist about 3 weeks ago for an infection, finish the abx 3 days ago  ? bleeding nose  ?  Has has nose bleeding when she blows her nose for the past 3 weeks   ? ? ?HPI ? ?Reports about 3 weeks ago she went to her dentist but was told she had an infection which needed to resolve before work could be done ?States she has been on Amoxicillin for 3 weeks and feels like she is still having swelling and fevers  ?Reports swelling along the jawline, chin and potentially at her left eye  ?States her temp this AM was 99.5  ?Reports she has been blowing her nose repeatedly and it is mostly dried blood with some bright red blood.  ? ? ?Patient Active Problem List  ? Diagnosis Date Noted  ? Breast abscess 07/13/2021  ? 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 Other specified problems related to psychosocial circumstances 05/09/2021  ? Dental infection 04/14/2021  ? Bleeding nose 02/07/2021  ? Memory loss or impairment 02/07/2021  ? Hyperlipidemia 12/30/2020  ? Gastroesophageal reflux disease 09/09/2020  ? Other constipation 09/09/2020  ? Panic disorder 07/15/2020  ? Smoking 07/15/2020  ? History of stroke 05/26/2020  ? IFG (impaired fasting glucose) 04/20/2020  ? HSV (herpes simplex virus) infection 04/20/2020  ? Anxiety and depression 02/25/2020  ? Allergic rhinitis 02/25/2020  ? Internal carotid artery stenosis 04/26/2017  ? Carotid stenosis, symptomatic, with infarction (HCC) 04/25/2017  ? Cerebral embolus 04/25/2017  ? Acute ischemic stroke (HCC) - Stroke: acute and sub-acute scattered cortical  infarcts involving the left MCA, very likely embolic in the setting of   ICA stenosis. Underlying fibromuscular dysplasia  04/18/2017  ? Hypertension   ? Fibromyalgia   ? Vitamin D deficiency disease   ? Neutrophilic leukocytosis   ? Chronic back pain   ? History of stomach ulcers 05/19/2013  ? Right hip pain 05/19/2013  ? Cocaine abuse in remission (HCC) 05/16/2013  ? Nondependent alcohol abuse, in remission 05/16/2013  ? Polysubstance dependence including opioid drug with daily use (HCC) 06/13/2011  ? ? ?Past Surgical History:  ?Procedure Laterality Date  ? Arm Surgery    ? CESAREAN SECTION    ? IR ANGIO INTRA EXTRACRAN SEL COM CAROTID INNOMINATE BILAT MOD SED  04/19/2017  ? IR ANGIO VERTEBRAL SEL VERTEBRAL BILAT MOD SED  04/19/2017  ? IR INTRAVSC STENT CERV CAROTID W/O EMB-PROT MOD SED INC ANGIO  04/25/2017  ? RADIOLOGY WITH ANESTHESIA Left 04/25/2017  ? Procedure: RADIOLOGY WITH ANESTHESIA LEFT CARDIAC  STENT;  Surgeon: Julieanne Cottoneveshwar, Sanjeev, MD;  [facility];  Service: Radiology;  Laterality: Left;  ? ? ?Family History  ?Problem Relation Age of Onset  ? Arthritis Mother   ? Cancer Mother   ?     breast  ? Heart failure Mother   ? Hyperlipidemia Father   ? Hypertension Father   ? Stroke Maternal Grandmother   ? ? ?Social History  ? ?Tobacco  Use  ? Smoking status: Every Day  ?  Packs/day: 0.50  ?  Years: 40.00  ?  Pack years: 20.00  ?  Types: Cigarettes  ? Smokeless tobacco: Never  ?Substance Use Topics  ? Alcohol use: No  ? ? ? ?Current Outpatient Medications:  ?  ALPRAZolam (XANAX) 0.5 MG tablet, Take 1 tablet (0.5 mg total) by mouth at bedtime as needed for anxiety., Disp: 30 tablet, Rfl: 0 ?  atorvastatin (LIPITOR) 80 MG tablet, Take 1 tablet (80 mg total) by mouth daily at 6 PM., Disp: 90 tablet, Rfl: 1 ?  clindamycin (CLEOCIN) 150 MG capsule, Take 3 capsules (450 mg total) by mouth 3 (three) times daily for 7 days., Disp: 63 capsule, Rfl: 0 ?  fluticasone (FLONASE) 50 MCG/ACT nasal spray, Place 1 spray into  both nostrils in the morning and at bedtime., Disp: 16 g, Rfl: 5 ?  hydrochlorothiazide (HYDRODIURIL) 25 MG tablet, Take 1 tablet (25 mg total) by mouth daily., Disp: 90 tablet, Rfl: 0 ?  hydrOXYzine (VISTARIL) 100 MG capsule, Take 1 capsule (100 mg total) by mouth 3 (three) times daily as needed., Disp: 90 capsule, Rfl: 0 ?  losartan (COZAAR) 50 MG tablet, Take 1 tablet (50 mg total) by mouth daily., Disp: 90 tablet, Rfl: 0 ?  montelukast (SINGULAIR) 10 MG tablet, Take 1 tablet (10 mg total) by mouth at bedtime., Disp: 30 tablet, Rfl: 3 ?  ondansetron (ZOFRAN-ODT) 4 MG disintegrating tablet, Take 1 tablet (4 mg total) by mouth every 8 (eight) hours as needed for nausea or vomiting., Disp: 20 tablet, Rfl: 0 ?  pantoprazole (PROTONIX) 40 MG tablet, Take 1 tablet (40 mg total) by mouth daily., Disp: 90 tablet, Rfl: 0 ?  traZODone (DESYREL) 100 MG tablet, Take 2 tablets (200 mg total) by mouth at bedtime as needed for sleep., Disp: 180 tablet, Rfl: 0 ?  venlafaxine XR (EFFEXOR XR) 75 MG 24 hr capsule, Take 1 capsule (75 mg total) by mouth daily with breakfast. To take with Effexor 150mg ., Disp: 90 capsule, Rfl: 1 ?  venlafaxine XR (EFFEXOR-XR) 150 MG 24 hr capsule, Take 1 capsule (150 mg total) by mouth daily with breakfast., Disp: 90 capsule, Rfl: 1 ?  clopidogrel (PLAVIX) 75 MG tablet, Take 75 mg by mouth daily. (Patient not taking: Reported on 02/13/2022), Disp: , Rfl:  ?  gabapentin (NEURONTIN) 100 MG capsule, Take 1 capsule (100 mg total) by mouth at bedtime. (Patient not taking: Reported on 02/13/2022), Disp: 30 capsule, Rfl: 1 ?  orphenadrine (NORFLEX) 100 MG tablet, Take 1 tablet (100 mg total) by mouth 2 (two) times daily. (Patient not taking: Reported on 02/13/2022), Disp: 10 tablet, Rfl: 0 ?  oxyCODONE-acetaminophen (PERCOCET) 7.5-325 MG tablet, Take 1 tablet by mouth every 6 (six) hours as needed for severe pain. (Patient not taking: Reported on 02/13/2022), Disp: 12 tablet, Rfl: 0 ?  spironolactone (ALDACTONE) 25  MG tablet, Take 1 tablet (25 mg total) by mouth daily. (Patient not taking: Reported on 02/13/2022), Disp: 90 tablet, Rfl: 1 ? ?Allergies  ?Allergen Reactions  ? Nsaids Other (See Comments)  ?  Causes ulcers to bleed  ? Morphine And Related Itching  ? ? ?I personally reviewed active problem list, medication list, allergies with the patient/caregiver today. ? ? ?Review of Systems  ?Constitutional:  Positive for chills and fever.  ?HENT:  Positive for sinus pain.   ?Eyes:  Positive for discharge. Negative for pain.  ?Respiratory:  Positive for cough and shortness of breath.   ?  Cardiovascular:  Negative for chest pain.  ?Gastrointestinal:  Positive for diarrhea, nausea and vomiting.  ?Musculoskeletal:  Positive for myalgias.  ?Neurological:  Positive for headaches.  ? ? ? ?Objective ? ?Vitals:  ? 02/13/22 1414  ?BP: 137/84  ?Pulse: 66  ?Temp: 98.1 ?F (36.7 ?C)  ?TempSrc: Oral  ?SpO2: 96%  ?Weight: 210 lb 12.8 oz (95.6 kg)  ?Height: 5' 5.98" (1.676 m)  ? ? ?Body mass index is 34.04 kg/m?. ? ?Physical Exam ?Constitutional:   ?   Appearance: Normal appearance. She is obese.  ?HENT:  ?   Head: Normocephalic and atraumatic.  ?   Jaw: Tenderness, swelling and pain on movement present.  ?   Mouth/Throat:  ?   Mouth: Mucous membranes are moist.  ?   Dentition: Abnormal dentition. Does not have dentures. Dental caries and gum lesions present.  ?   Tongue: No lesions.  ?   Palate: No mass and lesions.  ?Cardiovascular:  ?   Rate and Rhythm: Normal rate and regular rhythm.  ?   Pulses: Normal pulses.  ?   Heart sounds: Normal heart sounds.  ?Pulmonary:  ?   Effort: Pulmonary effort is normal.  ?   Breath sounds: Normal breath sounds and air entry. No decreased breath sounds, wheezing, rhonchi or rales.  ?Neurological:  ?   Mental Status: She is alert.  ? ? ? ?Recent Results (from the past 2160 hour(s))  ?CBC with Differential     Status: Abnormal  ? Collection Time: 12/26/21  7:56 PM  ?Result Value Ref Range  ? WBC 12.1 (H) 4.0 -  10.5 K/uL  ? RBC 5.90 (H) 3.87 - 5.11 MIL/uL  ? Hemoglobin 17.2 (H) 12.0 - 15.0 g/dL  ? HCT 51.1 (H) 36.0 - 46.0 %  ? MCV 86.6 80.0 - 100.0 fL  ? MCH 29.2 26.0 - 34.0 pg  ? MCHC 33.7 30.0 - 36.0 g/dL  ? RDW 13

## 2022-02-13 NOTE — Assessment & Plan Note (Addendum)
Acute, new problem ?Patient was seen by dentist for dental work and was told she had an infection - was placed on Amoxicillin for 3 weeks per patient  ?States she started having signs of a cold and noted facial swelling and pain about a week ago ?Concerned for more severe infection at this time given extended abx regimen and continued swelling/ pain ?Recommend imaging of face and jaw to rule out osteomyelitis  ?Will add Clindamycin 450 mg PO TID x 7 days to assist with resolution  ?Imaging did not reveal signs of osteomyelitis  ?Recommend continued follow up with dental provider to assist with monitoring of progress.  ?Discussed ED and return precautions with patient - she verbalized understanding and is in agreement ?Recommend alternating Tylenol and Ibuprofen for pain management ? ?

## 2022-02-13 NOTE — Telephone Encounter (Signed)
?  Chief Complaint: Pain and facial swelling ?Symptoms: Pain and lower left jaw swelling ?Frequency: Ongoing for 3 weeks - swelling increased a lot the past week ?Pertinent Negatives: Patient denies  ?Disposition: [] ED /[] Urgent Care (no appt availability in office) / [x] Appointment(In office/virtual)/ []  [facility]/ [] Home Care/ [] Refused Recommended Disposition /[] [facility]/ []  Follow-up with PCP ?Additional Notes: Pt has taken 2 courses of antibiotics which did seem to help. Antibiotics are completed and facial pain and swelling have increased. Pt also reports a fever. Pt had seen a dentist who prescribed the antibiotics.  ? ?Reason for Disposition ? [1] Swelling is red AND [2] very painful to touch ? ?Answer Assessment - Initial Assessment Questions ?1. ONSET: "When did the swelling start?" (e.g., minutes, hours, days) ?    3 weeks - bad swelling 6 days ?2. LOCATION: "What part of the face is swollen?" ?    Lower left jaw ?3. SEVERITY: "How swollen is it?" ?    very ?4. ITCHING: "Is there any itching?" If Yes, ask: "How much?"   (Scale 1-10; mild, moderate or severe) ?    no ?5. PAIN: "Is the swelling painful to touch?" If Yes, ask: "How painful is it?"   (Scale 1-10; mild, moderate or severe) ?  - NONE (0): no pain ?  - MILD (1-3): doesn't interfere with normal activities  ?  - MODERATE (4-7): interferes with normal activities or awakens from sleep  ?  - SEVERE (8-10): excruciating pain, unable to do any normal activities  ?    9/10 ?6. FEVER: "Do you have a fever?" If Yes, ask: "What is it, how was it measured, and when did it start?"  ?    yes ?7. CAUSE: "What do you think is causing the face swelling?" ?    infection ?8. RECURRENT SYMPTOM: "Have you had face swelling before?" If Yes, ask: "When was the last time?" "What happened that time?" ?     ?9. OTHER SYMPTOMS: "Do you have any other symptoms?" (e.g., toothache, leg swelling) ?    fever ?10. PREGNANCY: "Is there any chance you  are pregnant?" "When was your last menstrual period?" ?      no ? ?Protocols used: Face Swelling-A-AH ? ?

## 2022-02-13 NOTE — Patient Instructions (Addendum)
You can continue to alternate Ibuprofen and Tylenol every 4 hours for pain  ?I am sending in a script for Cephalexin 500 mg to be taken by mouth every 6 hours for 7 days ?Continue taking this unless you have a reaction or are instructed to stop ? ?I would like you to go get xrays of your face and jaw to make sure you have not developed a bone infection of your jaw ? ?We will keep you updated on the results are they are available ? ?If you notice more swelling, fever, drooling, trouble breathing, a bad taste in your mouth, drainage in your mouth or color changes to your gums please go to the ED for more intensive care.  ? ?Please go to this address for imaging ?[address] [employer] ?[address] ?[place], [place] [address]  ?

## 2022-03-13 ENCOUNTER — Ambulatory Visit: Payer: Self-pay

## 2022-03-13 NOTE — Telephone Encounter (Signed)
Pt states she was seen on 5-8 for cold symptoms, pt states she was prescribed antibiotics and completed rx last week. Pt states she is still experiencing severe nasal congestion w/ blood when she blows her nose.   Please assist pt further    Chief Complaint: Seen 02/13/22 for dental infection and started on Clindamycin. Feels better, but still has bloody discharge from nose and fatigue, body aches. Symptoms: Above Frequency: Finished antibiotic. Pertinent Negatives: Patient denies fever Disposition: [] ED /[] Urgent Care (no appt availability in office) / [] Appointment(In office/virtual)/ []  [facility]/ [] Home Care/ [] Refused Recommended Disposition /[] [facility]/ [x]  Follow-up with PCP Additional Notes: Declines appointment. Asking for more antibiotics. Please advise.  Answer Assessment - Initial Assessment Questions 1. INFECTION: "What infection is the antibiotic being given for?"     Dental infection 2. ANTIBIOTIC: "What antibiotic are you taking" "How many times per day?"     Clindamycin 3. DURATION: "When was the antibiotic started?"     02/13/22 4. MAIN CONCERN OR SYMPTOM:  "What is your main concern right now?"     Still having bloody mucus when blows nose, fatigue, body aches 5. BETTER-SAME-WORSE: "Are you getting better, staying the same, or getting worse compared to when you first started the antibiotics?" If getting worse, ask: "In what way?"      Better 6. FEVER: "Do you have a fever?" If Yes, ask: "What is your temperature, how was it measured, and when did it start?"     No 7. SYMPTOMS: "Are there any other symptoms you're concerned about?" If Yes, ask: "When did it start?"     N/a 8. FOLLOW-UP APPOINTMENT: "Do you have a follow-up appointment with your doctor?"     No  Protocols used: Infection on Antibiotic Follow-up Call-A-[facility]

## 2022-03-13 NOTE — Telephone Encounter (Signed)
Attempted to contact patient, Latasha Lawrence. Patient will need appointment for further treatment.

## 2022-03-14 NOTE — Telephone Encounter (Signed)
Attempted to contact patient, NA LVM  

## 2022-03-15 NOTE — Telephone Encounter (Signed)
Appointment is scheduled for tomorrow.

## 2022-03-16 ENCOUNTER — Telehealth: Payer: Self-pay

## 2022-03-16 ENCOUNTER — Ambulatory Visit: Admitting: Internal Medicine

## 2022-03-16 NOTE — Telephone Encounter (Signed)
Patient would like to know if an antibiotic can be sent to her pharmacy because her appointment had to be cancelled today.Offered patient to have a virtual visit but she stated that her insurance will not cover the visit. Patient states she was in the office recently and had an antibiotic and her only reason for scheduling an appointment with Dr. Neomia Dear  for today was to get another one.  Please call patient to advise is an antibiotic can be sent to the pharmacy.

## 2022-03-16 NOTE — Telephone Encounter (Signed)
Routing to providers to advise. Last seen by Junie Panning 02/13/22, was supposed to see Dr. Neomia Dear today, Santiago Glad is PCP.

## 2022-03-20 ENCOUNTER — Ambulatory Visit: Admitting: Physician Assistant

## 2022-03-20 ENCOUNTER — Other Ambulatory Visit: Payer: Self-pay

## 2022-03-20 ENCOUNTER — Emergency Department

## 2022-03-20 ENCOUNTER — Emergency Department
Admission: EM | Admit: 2022-03-20 | Discharge: 2022-03-20 | Disposition: A | Discharge status: Home / Self Care | Attending: Emergency Medicine | Admitting: Emergency Medicine

## 2022-03-20 ENCOUNTER — Encounter: Payer: Self-pay | Admitting: Emergency Medicine

## 2022-03-20 DIAGNOSIS — R197 Diarrhea, unspecified: Secondary | ICD-10-CM | POA: Diagnosis not present

## 2022-03-20 DIAGNOSIS — R079 Chest pain, unspecified: Secondary | ICD-10-CM | POA: Insufficient documentation

## 2022-03-20 DIAGNOSIS — R0981 Nasal congestion: Secondary | ICD-10-CM | POA: Insufficient documentation

## 2022-03-20 DIAGNOSIS — R059 Cough, unspecified: Secondary | ICD-10-CM | POA: Diagnosis not present

## 2022-03-20 DIAGNOSIS — R1110 Vomiting, unspecified: Secondary | ICD-10-CM | POA: Insufficient documentation

## 2022-03-20 DIAGNOSIS — I10 Essential (primary) hypertension: Secondary | ICD-10-CM | POA: Diagnosis not present

## 2022-03-20 DIAGNOSIS — R0602 Shortness of breath: Secondary | ICD-10-CM | POA: Insufficient documentation

## 2022-03-20 LAB — URINALYSIS, ROUTINE W REFLEX MICROSCOPIC
Bacteria, UA: NONE SEEN
Bilirubin Urine: NEGATIVE
Glucose, UA: NEGATIVE mg/dL
Hgb urine dipstick: NEGATIVE
Ketones, ur: 5 mg/dL — AB
Leukocytes,Ua: NEGATIVE
Nitrite: NEGATIVE
Protein, ur: 30 mg/dL — AB
Specific Gravity, Urine: 1.027 (ref 1.005–1.030)
pH: 6 (ref 5.0–8.0)

## 2022-03-20 LAB — BASIC METABOLIC PANEL
Anion gap: 8 (ref 5–15)
BUN: 13 mg/dL (ref 6–20)
CO2: 24 mmol/L (ref 22–32)
Calcium: 9 mg/dL (ref 8.9–10.3)
Chloride: 106 mmol/L (ref 98–111)
Creatinine, Ser: 0.73 mg/dL (ref 0.44–1.00)
GFR, Estimated: >60 mL/min (ref >60)
Glucose, Bld: 125 mg/dL — ABNORMAL HIGH (ref 70–99)
Potassium: 2.9 mmol/L — ABNORMAL LOW (ref 3.5–5.1)
Sodium: 138 mmol/L (ref 135–145)

## 2022-03-20 LAB — POC URINE PREG, ED: Preg Test, Ur: NEGATIVE

## 2022-03-20 LAB — CBC
HCT: 47.2 % — ABNORMAL HIGH (ref 36.0–46.0)
Hemoglobin: 16.3 g/dL — ABNORMAL HIGH (ref 12.0–15.0)
MCH: 28.8 pg (ref 26.0–34.0)
MCHC: 34.5 g/dL (ref 30.0–36.0)
MCV: 83.5 fL (ref 80.0–100.0)
Platelets: 434 10*3/uL — ABNORMAL HIGH (ref 150–400)
RBC: 5.65 MIL/uL — ABNORMAL HIGH (ref 3.87–5.11)
RDW: 14.6 % (ref 11.5–15.5)
WBC: 13 10*3/uL — ABNORMAL HIGH (ref 4.0–10.5)
nRBC: 0 % (ref 0.0–0.2)

## 2022-03-20 LAB — TROPONIN I (HIGH SENSITIVITY)
Troponin I (High Sensitivity): 6 ng/L (ref <18)
Troponin I (High Sensitivity): 7 ng/L (ref <18)

## 2022-03-20 MED ORDER — DICYCLOMINE HCL 10 MG PO CAPS: 10.0000 mg | ORAL_CAPSULE | Freq: Three times a day (TID) | ORAL | 0 refills | Status: DC

## 2022-03-20 MED ORDER — LIDOCAINE 5 % EX PTCH
1.0000 | MEDICATED_PATCH | CUTANEOUS | Status: DC
  Administered 2022-03-20: 1 via TRANSDERMAL
  Filled 2022-03-20: qty 1

## 2022-03-20 MED ORDER — POTASSIUM CHLORIDE CRYS ER 20 MEQ PO TBCR
40.0000 meq | EXTENDED_RELEASE_TABLET | Freq: Once | ORAL | Status: AC
  Administered 2022-03-20: 40 meq via ORAL
  Filled 2022-03-20: qty 2

## 2022-03-20 MED ORDER — LIDOCAINE 4 % EX PTCH: 1.0000 | MEDICATED_PATCH | CUTANEOUS | 0 refills | Status: DC

## 2022-03-20 MED ORDER — POTASSIUM CHLORIDE CRYS ER 10 MEQ PO TBCR: 10.0000 meq | EXTENDED_RELEASE_TABLET | Freq: Every day | ORAL | 0 refills | Status: DC

## 2022-03-20 MED ORDER — KETOROLAC TROMETHAMINE 30 MG/ML IJ SOLN
30.0000 mg | Freq: Once | INTRAMUSCULAR | Status: AC
  Administered 2022-03-20: 30 mg via INTRAMUSCULAR
  Filled 2022-03-20: qty 1

## 2022-03-20 MED ORDER — NYSTATIN 100000 UNIT/ML MT SUSP: 5.0000 mL | Freq: Four times a day (QID) | OROMUCOSAL | 0 refills | Status: AC

## 2022-03-20 NOTE — ED Triage Notes (Addendum)
Pt via POV from home. Pt c/o mid-sternal chest pain and then pain across the shoulder blades. Pt c/o completely antibiotics recently for a dental infection. Pt also endorse NV. States she also feels like she has thrush when she had a few months ago, pt now complaining of sore throat that started in the past week. Denies any SOB at this time. Denies fevers. Pt is A&Ox4 and NAD

## 2022-03-20 NOTE — Telephone Encounter (Signed)
Please call and schedule the patient an in office appointment per [facility].

## 2022-03-20 NOTE — ED Notes (Signed)
Poct pregnancy Negative 

## 2022-03-20 NOTE — ED Provider Notes (Signed)
[facility] Provider Note  Patient Contact: 4:51 PM (approximate)   History   Chest Pain   HPI  Latasha Lawrence is a 51 y.o. female with a history of hypertension, hypokalemia, chronic back pain and fibromyalgia, presents to the emergency department with central, reproducible chest pain.  Patient states that her chest pain is reproducible to palpation and not exacerbated with ambulation.  She states that she thought she noticed some mild shortness of breath last night.  She states that her chest pain has improved while waiting in the emergency department.  She states that she has had some associated vomiting and diarrhea as well as occasional cough and nasal congestion.  She states that her roommate was sick with a similar illness last week.  She states that she is a daily smoker but is trying to quit.  Denies experiencing similar chest pain in the past.      Physical Exam   Triage Vital Signs: ED Triage Vitals  Enc Vitals Group     BP 03/20/22 1223 (!) 186/102     Pulse Rate 03/20/22 1223 79     Resp 03/20/22 1223 20     Temp 03/20/22 1223 98.5 F (36.9 C)     Temp Source 03/20/22 1223 Oral     SpO2 03/20/22 1223 99 %     Weight 03/20/22 1221 200 lb (90.7 kg)     Height 03/20/22 1221 5\' 6"  (1.676 m)     Head Circumference --      Peak Flow --      Pain Score 03/20/22 1221 7     Pain Loc --      Pain Edu? --      Excl. in GC? --     Most recent vital signs: Vitals:   03/20/22 1223 03/20/22 1625  BP: (!) 186/102 (!) 177/81  Pulse: 79 69  Resp: 20 16  Temp: 98.5 F (36.9 C)   SpO2: 99% 94%     General: Alert and in no acute distress. Eyes:  PERRL. EOMI. Head: No acute traumatic findings ENT:      Nose: No congestion/rhinnorhea.      Mouth/Throat: Mucous membranes are moist.  Neck: No stridor. No cervical spine tenderness to palpation. Cardiovascular:  Good peripheral perfusion Respiratory: Normal respiratory effort without tachypnea or  retractions. Lungs CTAB. Good air entry to the bases with no decreased or absent breath sounds. Gastrointestinal: Bowel sounds 4 quadrants. Soft and nontender to palpation. No guarding or rigidity. No palpable masses. No distention. No CVA tenderness. Musculoskeletal: Full range of motion to all extremities. Patient has central reproducible chest pain to palpation.  Neurologic:  No gross focal neurologic deficits are appreciated.  Skin:   No rash noted Other:   ED Results / Procedures / Treatments   Labs (all labs ordered are listed, but only abnormal results are displayed) Labs Reviewed  BASIC METABOLIC PANEL - Abnormal; Notable for the following components:      Result Value   Potassium 2.9 (*)    Glucose, Bld 125 (*)    All other components within normal limits  CBC - Abnormal; Notable for the following components:   WBC 13.0 (*)    RBC 5.65 (*)    Hemoglobin 16.3 (*)    HCT 47.2 (*)    Platelets 434 (*)    All other components within normal limits  URINALYSIS, ROUTINE W REFLEX MICROSCOPIC - Abnormal; Notable for the following components:  Color, Urine YELLOW (*)    APPearance HAZY (*)    Ketones, ur 5 (*)    Protein, ur 30 (*)    All other components within normal limits  POC URINE PREG, ED  TROPONIN I (HIGH SENSITIVITY)  TROPONIN I (HIGH SENSITIVITY)      RADIOLOGY  I personally viewed and evaluated these images as part of my medical decision making, as well as reviewing the written report by the radiologist.  ED Provider Interpretation: I personally interpreted chest x-ray and there were no acute abnormalities.  I agree with radiologist interpretation.   PROCEDURES:  Critical Care performed: No  Procedures   MEDICATIONS ORDERED IN ED: Medications  lidocaine (LIDODERM) 5 % 1 patch (1 patch Transdermal Patch Applied 03/20/22 1814)  potassium chloride SA (KLOR-CON M) CR tablet 40 mEq (40 mEq Oral Given 03/20/22 1656)  ketorolac (TORADOL) 30 MG/ML injection  30 mg (30 mg Intramuscular Given 03/20/22 1811)     IMPRESSION / MDM / ASSESSMENT AND PLAN / ED COURSE  I reviewed the triage vital signs and the nursing notes.                              Assessment and plan: Chest pain:  51 year old female presents to the emergency department with reproducible central chest pain.  Chest pain was reproducible to palpation on physical exam.  Patient was hypertensive at triage but vital signs were otherwise reassuring.  Patient was alert, active and nontoxic-appearing and endorsed recent vomiting and cough which is likely induced costochondritis.  EKG indicated normal sinus rhythm without ST segment elevation or other apparent arrhythmia.  Delta troponin within range.  CBC indicated mild leukocytosis.  Patient had mild hypokalemia on BMP.  I personally interpreted chest x-ray and there were no signs of pneumonia.  Suspect unspecified viral infection with associated costochondritis at this time.  Patient was given 40 mEq of potassium chloride in the emergency department as well as IM Toradol and a lidocaine patch.  She was discharged with lidocaine patches and 10 mEq of oral potassium over the next 5 days.  Return precautions were given to return if chest pain seems to worsen at home.  All patient questions were answered.  FINAL CLINICAL IMPRESSION(S) / ED DIAGNOSES   Final diagnoses:  Chest pain, unspecified type     Rx / DC Orders   ED Discharge Orders          Ordered    dicyclomine (BENTYL) 10 MG capsule  3 times daily before meals & bedtime        03/20/22 1811    lidocaine 4 %  Every 24 hours        03/20/22 1811    potassium chloride (KLOR-CON M) 10 MEQ tablet  Daily        03/20/22 1817    nystatin (MYCOSTATIN) 100000 UNIT/ML suspension  4 times daily        03/20/22 1830             Note:  This document was prepared using Dragon voice recognition software and may include unintentional dictation errors.   Gasper Lloyd 03/20/22 2101    Sharman Cheek, MD 03/25/22 [phone]

## 2022-03-20 NOTE — Telephone Encounter (Signed)
Called patient and spoke with her about scheduling an appointment. Appointment scheduled for today @ 3:40. Call keeps getting disconnected.

## 2022-03-21 ENCOUNTER — Other Ambulatory Visit: Payer: Self-pay | Admitting: Nurse Practitioner

## 2022-03-21 DIAGNOSIS — K219 Gastro-esophageal reflux disease without esophagitis: Secondary | ICD-10-CM

## 2022-03-22 ENCOUNTER — Other Ambulatory Visit: Payer: Self-pay | Admitting: Nurse Practitioner

## 2022-03-22 NOTE — Telephone Encounter (Signed)
Requested medication (s) are due for refill today: yes  Requested medication (s) are on the active medication list: yes    Last refill: 11/08/21  #20  0 refills  Future visit scheduled No  Notes to clinic: Not delegated, please review. Thank you.  Requested Prescriptions  Pending Prescriptions Disp Refills   ondansetron (ZOFRAN-ODT) 4 MG disintegrating tablet [Pharmacy Med Name: ONDANSETRON ODT 4 MG TABLET] 20 tablet 0    Sig: Take 1 tablet (4 mg total) by mouth every 8 (eight) hours as needed for nausea or vomiting.     Not Delegated - Gastroenterology: Antiemetics - ondansetron Failed - 03/21/2022  2:36 PM      Failed - This refill cannot be delegated      Passed - AST in normal range and within 360 days    AST  Date Value Ref Range Status  12/26/2021 19 15 - 41 U/L Final   SGOT(AST)  Date Value Ref Range Status  09/06/2014 < 5 (L) 15 - 37 Unit/L Final         Passed - ALT in normal range and within 360 days    ALT  Date Value Ref Range Status  12/26/2021 20 0 - 44 U/L Final   SGPT (ALT)  Date Value Ref Range Status  09/06/2014 21 U/L Final    Comment:    14-63 NOTE: New Reference Range 04/28/14          Passed - Valid encounter within last 6 months    Recent Outpatient Visits           1 month ago Dental infection   [facility] Mecum, Erin E, PA-C   6 months ago Anxiety and depression   [facility] Larae Grooms, NP   6 months ago Anxiety and depression   [facility] Larae Grooms, NP   8 months ago Breast abscess   [facility] Vigg, Avanti, MD   9 months ago Anxiety and depression   [facility] Larae Grooms, NP              Signed Prescriptions Disp Refills   pantoprazole (PROTONIX) 40 MG tablet 90 tablet 0    Sig: Take 1 tablet (40 mg total) by mouth daily.     Gastroenterology: Proton Pump Inhibitors Passed - 03/21/2022  2:36 PM      Passed - Valid encounter within  last 12 months    Recent Outpatient Visits           1 month ago Dental infection   [facility] Mecum, Oswaldo Conroy, PA-C   6 months ago Anxiety and depression   [facility] Larae Grooms, NP   6 months ago Anxiety and depression   [facility] Larae Grooms, NP   8 months ago Breast abscess   [facility] Vigg, Avanti, MD   9 months ago Anxiety and depression   [facility] Larae Grooms, NP

## 2022-03-22 NOTE — Telephone Encounter (Signed)
Requested Prescriptions  Pending Prescriptions Disp Refills  . venlafaxine XR (EFFEXOR-XR) 150 MG 24 hr capsule [Pharmacy Med Name: VENLAFAXINE HCL ER 150 MG CAP] 60 capsule 0    Sig: Take 1 capsule (150 mg total) by mouth daily with breakfast.     Psychiatry: Antidepressants - SNRI - desvenlafaxine & venlafaxine Failed - 03/22/2022 10:30 AM      Failed - Last BP in normal range    BP Readings from Last 1 Encounters:  03/20/22 (!) 177/81         Failed - Lipid Panel in normal range within the last 12 months    Cholesterol, Total  Date Value Ref Range Status  01/31/2021 121 100 - 199 mg/dL Final   Cholesterol  Date Value Ref Range Status  01/19/2014 268 (H) 0 - 200 mg/dL Final   Ldl Cholesterol, Calc  Date Value Ref Range Status  01/19/2014 167 (H) 0 - 100 mg/dL Final   LDL Chol Calc (NIH)  Date Value Ref Range Status  01/31/2021 59 0 - 99 mg/dL Final   HDL Cholesterol  Date Value Ref Range Status  01/19/2014 33 (L) 40 - 60 mg/dL Final   HDL  Date Value Ref Range Status  01/31/2021 43 >39 mg/dL Final   Triglycerides  Date Value Ref Range Status  01/31/2021 104 0 - 149 mg/dL Final  27/74/1287 867 (H) 0 - 200 mg/dL Final         Passed - Cr in normal range and within 360 days    Creatinine  Date Value Ref Range Status  09/06/2014 0.97 0.60 - 1.30 mg/dL Final   Creatinine, Ser  Date Value Ref Range Status  03/20/2022 0.73 0.44 - 1.00 mg/dL Final         Passed - Completed PHQ-2 or PHQ-9 in the last 360 days      Passed - Valid encounter within last 6 months    Recent Outpatient Visits          1 month ago Dental infection   [facility] Mecum, Oswaldo Conroy, PA-C   6 months ago Anxiety and depression   [facility] Larae Grooms, NP   6 months ago Anxiety and depression   [facility] Larae Grooms, NP   8 months ago Breast abscess   [facility] Vigg, Avanti, MD   9 months ago Anxiety and depression    [facility] Larae Grooms, NP      Future Appointments            Tomorrow Vigg, Avanti, MD [facility]

## 2022-03-22 NOTE — Telephone Encounter (Signed)
Requested Prescriptions  Pending Prescriptions Disp Refills  . pantoprazole (PROTONIX) 40 MG tablet [Pharmacy Med Name: PANTOPRAZOLE SOD DR 40 MG TAB] 90 tablet 0    Sig: Take 1 tablet (40 mg total) by mouth daily.     Gastroenterology: Proton Pump Inhibitors Passed - 03/21/2022  2:36 PM      Passed - Valid encounter within last 12 months    Recent Outpatient Visits          1 month ago Dental infection   [employer], Erin E, PA-C   6 months ago Anxiety and depression   [facility] Lester, Clydie Braun, NP   6 months ago Anxiety and depression   [facility] Larae Grooms, NP   8 months ago Breast abscess   [facility] Vigg, Avanti, MD   9 months ago Anxiety and depression   [facility] [place], Clydie Braun, NP             . ondansetron (ZOFRAN-ODT) 4 MG disintegrating tablet [Pharmacy Med Name: ONDANSETRON ODT 4 MG TABLET] 20 tablet 0    Sig: Take 1 tablet (4 mg total) by mouth every 8 (eight) hours as needed for nausea or vomiting.     Not Delegated - Gastroenterology: Antiemetics - ondansetron Failed - 03/21/2022  2:36 PM      Failed - This refill cannot be delegated      Passed - AST in normal range and within 360 days    AST  Date Value Ref Range Status  12/26/2021 19 15 - 41 U/L Final   SGOT(AST)  Date Value Ref Range Status  09/06/2014 < 5 (L) 15 - 37 Unit/L Final         Passed - ALT in normal range and within 360 days    ALT  Date Value Ref Range Status  12/26/2021 20 0 - 44 U/L Final   SGPT (ALT)  Date Value Ref Range Status  09/06/2014 21 U/L Final    Comment:    14-63 NOTE: New Reference Range 04/28/14          Passed - Valid encounter within last 6 months    Recent Outpatient Visits          1 month ago Dental infection   [facility] Mecum, Oswaldo Conroy, PA-C   6 months ago Anxiety and depression   [facility] Larae Grooms, NP   6 months ago Anxiety  and depression   [facility] Larae Grooms, NP   8 months ago Breast abscess   [facility] Vigg, Avanti, MD   9 months ago Anxiety and depression   [facility] Larae Grooms, NP

## 2022-03-23 ENCOUNTER — Ambulatory Visit: Admitting: Internal Medicine

## 2022-04-12 ENCOUNTER — Emergency Department
Admission: EM | Admit: 2022-04-12 | Discharge: 2022-04-12 | Discharge status: Against Medical Advice | Attending: Emergency Medicine | Admitting: Emergency Medicine

## 2022-04-12 ENCOUNTER — Other Ambulatory Visit: Payer: Self-pay

## 2022-04-12 DIAGNOSIS — R519 Headache, unspecified: Secondary | ICD-10-CM | POA: Diagnosis not present

## 2022-04-12 DIAGNOSIS — Z5321 Procedure and treatment not carried out due to patient leaving prior to being seen by health care provider: Secondary | ICD-10-CM | POA: Insufficient documentation

## 2022-04-12 DIAGNOSIS — R509 Fever, unspecified: Secondary | ICD-10-CM | POA: Diagnosis not present

## 2022-04-12 DIAGNOSIS — K0889 Other specified disorders of teeth and supporting structures: Secondary | ICD-10-CM | POA: Diagnosis present

## 2022-04-12 DIAGNOSIS — R110 Nausea: Secondary | ICD-10-CM | POA: Diagnosis not present

## 2022-04-12 NOTE — ED Triage Notes (Signed)
Pt here with a left side dental infx. Pt had nausea, fever, and headaches. Pt ambulatory to triage.

## 2022-04-13 ENCOUNTER — Ambulatory Visit (INDEPENDENT_AMBULATORY_CARE_PROVIDER_SITE_OTHER): Admitting: Nurse Practitioner

## 2022-04-13 ENCOUNTER — Encounter: Payer: Self-pay | Admitting: Nurse Practitioner

## 2022-04-13 ENCOUNTER — Other Ambulatory Visit: Payer: Self-pay | Admitting: Nurse Practitioner

## 2022-04-13 VITALS — BP 118/68 | HR 76 | Temp 98.5°F | Wt 208.8 lb

## 2022-04-13 DIAGNOSIS — R509 Fever, unspecified: Secondary | ICD-10-CM

## 2022-04-13 DIAGNOSIS — K047 Periapical abscess without sinus: Secondary | ICD-10-CM

## 2022-04-13 DIAGNOSIS — R110 Nausea: Secondary | ICD-10-CM | POA: Diagnosis not present

## 2022-04-13 MED ORDER — AMOXICILLIN-POT CLAVULANATE 875-125 MG PO TABS: 1.0000 | ORAL_TABLET | Freq: Two times a day (BID) | ORAL | 0 refills | Status: DC

## 2022-04-13 NOTE — Progress Notes (Signed)
BP 118/68   Pulse 76   Temp 98.5 F (36.9 C) (Oral)   Wt 208 lb 12.8 oz (94.7 kg)   SpO2 95%   BMI 33.70 kg/m    Subjective:    Patient ID: Latasha Lawrence, female    DOB: 09-13-1971, 51 y.o.   MRN: [id number]  HPI: KINSLIE HOVE is a 51 y.o. female  Chief Complaint  Patient presents with   Nausea    Pt feels like she has a mouth infection, has been recently treated for this.    DENTAL INFECTION Duration: two weeks Involved teeth: left and lower Dentist evaluation: yes Mechanism of injury:  unknown Onset: gradual Fevers: yes Swelling: yes Redness: yes Paresthesias / decreased sensation: no Sinus pressure: no States she is having nausea  Relevant past medical, surgical, family and social history reviewed and updated as indicated. Interim medical history since our last visit reviewed. Allergies and medications reviewed and updated.  Review of Systems  Constitutional:  Positive for fever.  HENT:  Positive for dental problem.     Per HPI unless specifically indicated above     Objective:    BP 118/68   Pulse 76   Temp 98.5 F (36.9 C) (Oral)   Wt 208 lb 12.8 oz (94.7 kg)   SpO2 95%   BMI 33.70 kg/m   Wt Readings from Last 3 Encounters:  04/13/22 208 lb 12.8 oz (94.7 kg)  04/12/22 200 lb (90.7 kg)  03/20/22 200 lb (90.7 kg)    Physical Exam Vitals and nursing note reviewed.  Constitutional:      General: She is not in acute distress.    Appearance: Normal appearance. She is normal weight. She is not ill-appearing, toxic-appearing or diaphoretic.  HENT:     Head: Normocephalic.     Right Ear: External ear normal.     Left Ear: External ear normal.     Nose: Nose normal.     Mouth/Throat:     Mouth: Mucous membranes are moist.     Pharynx: Oropharynx is clear.   Eyes:     General:        Right eye: No discharge.        Left eye: No discharge.     Extraocular Movements: Extraocular movements intact.     Conjunctiva/sclera: Conjunctivae normal.      Pupils: Pupils are equal, round, and reactive to light.  Cardiovascular:     Rate and Rhythm: Normal rate and regular rhythm.     Heart sounds: No murmur heard. Pulmonary:     Effort: Pulmonary effort is normal. No respiratory distress.     Breath sounds: Normal breath sounds. No wheezing or rales.  Musculoskeletal:     Cervical back: Normal range of motion and neck supple.  Skin:    General: Skin is warm and dry.     Capillary Refill: Capillary refill takes less than 2 seconds.  Neurological:     General: No focal deficit present.     Mental Status: She is alert and oriented to person, place, and time. Mental status is at baseline.  Psychiatric:        Mood and Affect: Mood normal.        Behavior: Behavior normal.        Thought Content: Thought content normal.        Judgment: Judgment normal.     Results for orders placed or performed during the hospital encounter of 03/20/22  Basic metabolic  panel  Result Value Ref Range   Sodium 138 135 - 145 mmol/L   Potassium 2.9 (L) 3.5 - 5.1 mmol/L   Chloride 106 98 - 111 mmol/L   CO2 24 22 - 32 mmol/L   Glucose, Bld 125 (H) 70 - 99 mg/dL   BUN 13 6 - 20 mg/dL   Creatinine, Ser 0.27 0.44 - 1.00 mg/dL   Calcium 9.0 8.9 - 25.3 mg/dL   GFR, Estimated >66 >44 mL/min   Anion gap 8 5 - 15  CBC  Result Value Ref Range   WBC 13.0 (H) 4.0 - 10.5 K/uL   RBC 5.65 (H) 3.87 - 5.11 MIL/uL   Hemoglobin 16.3 (H) 12.0 - 15.0 g/dL   HCT 03.4 (H) 74.2 - 59.5 %   MCV 83.5 80.0 - 100.0 fL   MCH 28.8 26.0 - 34.0 pg   MCHC 34.5 30.0 - 36.0 g/dL   RDW 63.8 75.6 - 43.3 %   Platelets 434 (H) 150 - 400 K/uL   nRBC 0.0 0.0 - 0.2 %  Urinalysis, Routine w reflex microscopic Urine, Clean Catch  Result Value Ref Range   Color, Urine YELLOW (A) YELLOW   APPearance HAZY (A) CLEAR   Specific Gravity, Urine 1.027 1.005 - 1.030   pH 6.0 5.0 - 8.0   Glucose, UA NEGATIVE NEGATIVE mg/dL   Hgb urine dipstick NEGATIVE NEGATIVE   Bilirubin Urine NEGATIVE  NEGATIVE   Ketones, ur 5 (A) NEGATIVE mg/dL   Protein, ur 30 (A) NEGATIVE mg/dL   Nitrite NEGATIVE NEGATIVE   Leukocytes,Ua NEGATIVE NEGATIVE   RBC / HPF 6-10 0 - 5 RBC/hpf   WBC, UA 0-5 0 - 5 WBC/hpf   Bacteria, UA NONE SEEN NONE SEEN   Squamous Epithelial / LPF 0-5 0 - 5   Mucus PRESENT   POC urine preg, ED  Result Value Ref Range   Preg Test, Ur NEGATIVE NEGATIVE  Troponin I (High Sensitivity)  Result Value Ref Range   Troponin I (High Sensitivity) 6 <18 ng/L  Troponin I (High Sensitivity)  Result Value Ref Range   Troponin I (High Sensitivity) 7 <18 ng/L      Assessment & Plan:   Problem List Items Addressed This Visit       Digestive   Dental infection - Primary    Ongoing problem Patient was seen by dentist for dental work and was told she had an infection - was placed on Amoxicillin for 3 weeks per patient.  She was treated again with Clindamycin x 7 days.  Infection came back 5 days later.  Discussed the importance of going to see Dentist ASAP.  Discussed the infection will likely continue to reoccur until she has it removed.  Discussed that she is at risk for complications such as C diff with continued use of antibiotics. Discussed ED and return precautions with patient - she verbalized understanding and is in agreement Recommend alternating Tylenol and Ibuprofen for pain management         Follow up plan: Return if symptoms worsen or fail to improve.

## 2022-04-13 NOTE — Telephone Encounter (Signed)
Requested medication (s) are due for refill today - provider review   Requested medication (s) are on the active medication list -yes  Future visit scheduled -today  Last refill: 03/22/22 #20  Notes to clinic: non delegated Rx  Requested Prescriptions  Pending Prescriptions Disp Refills   ondansetron (ZOFRAN-ODT) 4 MG disintegrating tablet [Pharmacy Med Name: ONDANSETRON ODT 4 MG TABLET] 20 tablet 0    Sig: Take 1 tablet (4 mg total) by mouth every 8 (eight) hours as needed for nausea or vomiting.     Not Delegated - Gastroenterology: Antiemetics - ondansetron Failed - 04/13/2022 11:48 AM      Failed - This refill cannot be delegated      Passed - AST in normal range and within 360 days    AST  Date Value Ref Range Status  12/26/2021 19 15 - 41 U/L Final   SGOT(AST)  Date Value Ref Range Status  09/06/2014 < 5 (L) 15 - 37 Unit/L Final         Passed - ALT in normal range and within 360 days    ALT  Date Value Ref Range Status  12/26/2021 20 0 - 44 U/L Final   SGPT (ALT)  Date Value Ref Range Status  09/06/2014 21 U/L Final    Comment:    14-63 NOTE: New Reference Range 04/28/14          Passed - Valid encounter within last 6 months    Recent Outpatient Visits           Today Dental infection   [facility] Larae Grooms, NP   1 month ago Dental infection   [employer], Oswaldo Conroy, PA-C   6 months ago Anxiety and depression   [facility] [place], Clydie Braun, NP   7 months ago Anxiety and depression   [facility] Larae Grooms, NP   9 months ago Breast abscess   [facility] Vigg, Avanti, MD                 Requested Prescriptions  Pending Prescriptions Disp Refills   ondansetron (ZOFRAN-ODT) 4 MG disintegrating tablet [Pharmacy Med Name: ONDANSETRON ODT 4 MG TABLET] 20 tablet 0    Sig: Take 1 tablet (4 mg total) by mouth every 8 (eight) hours as needed for nausea or vomiting.      Not Delegated - Gastroenterology: Antiemetics - ondansetron Failed - 04/13/2022 11:48 AM      Failed - This refill cannot be delegated      Passed - AST in normal range and within 360 days    AST  Date Value Ref Range Status  12/26/2021 19 15 - 41 U/L Final   SGOT(AST)  Date Value Ref Range Status  09/06/2014 < 5 (L) 15 - 37 Unit/L Final         Passed - ALT in normal range and within 360 days    ALT  Date Value Ref Range Status  12/26/2021 20 0 - 44 U/L Final   SGPT (ALT)  Date Value Ref Range Status  09/06/2014 21 U/L Final    Comment:    14-63 NOTE: New Reference Range 04/28/14          Passed - Valid encounter within last 6 months    Recent Outpatient Visits           Today Dental infection   [facility] Larae Grooms, NP   1 month ago Dental infection  [facility] [place], Oswaldo Conroy, PA-C   6 months ago Anxiety and depression   [facility] Larae Grooms, NP   7 months ago Anxiety and depression   [facility] Larae Grooms, NP   9 months ago Breast abscess   [facility] Loura Pardon, MD

## 2022-04-13 NOTE — Assessment & Plan Note (Signed)
Ongoing problem Patient was seen by dentist for dental work and was told she had an infection - was placed on Amoxicillin for 3 weeks per patient.  She was treated again with Clindamycin x 7 days.  Infection came back 5 days later.  Discussed the importance of going to see Dentist ASAP.  Discussed the infection will likely continue to reoccur until she has it removed.  Discussed that she is at risk for complications such as C diff with continued use of antibiotics. Discussed ED and return precautions with patient - she verbalized understanding and is in agreement Recommend alternating Tylenol and Ibuprofen for pain management

## 2022-05-12 ENCOUNTER — Other Ambulatory Visit (INDEPENDENT_AMBULATORY_CARE_PROVIDER_SITE_OTHER): Payer: Self-pay | Admitting: Vascular Surgery

## 2022-05-12 DIAGNOSIS — I6523 Occlusion and stenosis of bilateral carotid arteries: Secondary | ICD-10-CM

## 2022-05-15 ENCOUNTER — Encounter (INDEPENDENT_AMBULATORY_CARE_PROVIDER_SITE_OTHER)

## 2022-05-15 ENCOUNTER — Ambulatory Visit (INDEPENDENT_AMBULATORY_CARE_PROVIDER_SITE_OTHER): Admitting: Vascular Surgery

## 2022-06-08 ENCOUNTER — Other Ambulatory Visit: Payer: Self-pay | Admitting: Nurse Practitioner

## 2022-06-08 ENCOUNTER — Other Ambulatory Visit: Payer: Self-pay | Admitting: Family Medicine

## 2022-06-08 DIAGNOSIS — K219 Gastro-esophageal reflux disease without esophagitis: Secondary | ICD-10-CM

## 2022-06-09 NOTE — Telephone Encounter (Signed)
Requested medication (s) are due for refill today - expired Rx  Requested medication (s) are on the active medication list -yes  Future visit scheduled -no  Last refill: 01/31/21 #90 1RF  Notes to clinic: expired Rx, fails lab protocol- over 1 year-01/31/21  Requested Prescriptions  Pending Prescriptions Disp Refills   atorvastatin (LIPITOR) 80 MG tablet [Pharmacy Med Name: ATORVASTATIN 80 MG TABLET] 30 tablet 0    Sig: Take 1 tablet (80 mg total) by mouth daily at 6 PM.     Cardiovascular:  Antilipid - Statins Failed - 06/08/2022  4:49 PM      Failed - Lipid Panel in normal range within the last 12 months    Cholesterol, Total  Date Value Ref Range Status  01/31/2021 121 100 - 199 mg/dL Final   Cholesterol  Date Value Ref Range Status  01/19/2014 268 (H) 0 - 200 mg/dL Final   Ldl Cholesterol, Calc  Date Value Ref Range Status  01/19/2014 167 (H) 0 - 100 mg/dL Final   LDL Chol Calc (NIH)  Date Value Ref Range Status  01/31/2021 59 0 - 99 mg/dL Final   HDL Cholesterol  Date Value Ref Range Status  01/19/2014 33 (L) 40 - 60 mg/dL Final   HDL  Date Value Ref Range Status  01/31/2021 43 >39 mg/dL Final   Triglycerides  Date Value Ref Range Status  01/31/2021 104 0 - 149 mg/dL Final  35/57/3220 254 (H) 0 - 200 mg/dL Final         Passed - Patient is not pregnant      Passed - Valid encounter within last 12 months    Recent Outpatient Visits           1 month ago Dental infection   [facility] Larae Grooms, NP   3 months ago Dental infection   [employer], Oswaldo Conroy, PA-C   8 months ago Anxiety and depression   [facility] Larae Grooms, NP   9 months ago Anxiety and depression   [facility] [place], Clydie Braun, NP   11 months ago Breast abscess   [facility] Vigg, Avanti, MD              Signed Prescriptions Disp Refills   pantoprazole (PROTONIX) 40 MG tablet 90 tablet 0     Sig: Take 1 tablet (40 mg total) by mouth daily.     Gastroenterology: Proton Pump Inhibitors Passed - 06/08/2022  4:49 PM      Passed - Valid encounter within last 12 months    Recent Outpatient Visits           1 month ago Dental infection   [facility] Larae Grooms, NP   3 months ago Dental infection   [employer], Oswaldo Conroy, PA-C   8 months ago Anxiety and depression   [facility] Larae Grooms, NP   9 months ago Anxiety and depression   [facility] Larae Grooms, NP   11 months ago Breast abscess   [facility] Vigg, Avanti, MD                 Requested Prescriptions  Pending Prescriptions Disp Refills   atorvastatin (LIPITOR) 80 MG tablet [Pharmacy Med Name: ATORVASTATIN 80 MG TABLET] 30 tablet 0    Sig: Take 1 tablet (80 mg total) by mouth daily at 6 PM.     Cardiovascular:  Antilipid - Statins Failed -  06/08/2022  4:49 PM      Failed - Lipid Panel in normal range within the last 12 months    Cholesterol, Total  Date Value Ref Range Status  01/31/2021 121 100 - 199 mg/dL Final   Cholesterol  Date Value Ref Range Status  01/19/2014 268 (H) 0 - 200 mg/dL Final   Ldl Cholesterol, Calc  Date Value Ref Range Status  01/19/2014 167 (H) 0 - 100 mg/dL Final   LDL Chol Calc (NIH)  Date Value Ref Range Status  01/31/2021 59 0 - 99 mg/dL Final   HDL Cholesterol  Date Value Ref Range Status  01/19/2014 33 (L) 40 - 60 mg/dL Final   HDL  Date Value Ref Range Status  01/31/2021 43 >39 mg/dL Final   Triglycerides  Date Value Ref Range Status  01/31/2021 104 0 - 149 mg/dL Final  10/93/2355 732 (H) 0 - 200 mg/dL Final         Passed - Patient is not pregnant      Passed - Valid encounter within last 12 months    Recent Outpatient Visits           1 month ago Dental infection   [facility] Larae Grooms, NP   3 months ago Dental infection   [employer], Oswaldo Conroy, PA-C   8 months ago Anxiety and depression   [facility] Larae Grooms, NP   9 months ago Anxiety and depression   [facility] [place], Clydie Braun, NP   11 months ago Breast abscess   [facility] Vigg, Avanti, MD              Signed Prescriptions Disp Refills   pantoprazole (PROTONIX) 40 MG tablet 90 tablet 0    Sig: Take 1 tablet (40 mg total) by mouth daily.     Gastroenterology: Proton Pump Inhibitors Passed - 06/08/2022  4:49 PM      Passed - Valid encounter within last 12 months    Recent Outpatient Visits           1 month ago Dental infection   [facility] Larae Grooms, NP   3 months ago Dental infection   [employer], Oswaldo Conroy, PA-C   8 months ago Anxiety and depression   [facility] Larae Grooms, NP   9 months ago Anxiety and depression   [facility] Larae Grooms, NP   11 months ago Breast abscess   [facility] Loura Pardon, MD                c

## 2022-06-09 NOTE — Telephone Encounter (Signed)
Requested Prescriptions  Pending Prescriptions Disp Refills  . pantoprazole (PROTONIX) 40 MG tablet [Pharmacy Med Name: PANTOPRAZOLE SOD DR 40 MG TAB] 90 tablet 0    Sig: Take 1 tablet (40 mg total) by mouth daily.     Gastroenterology: Proton Pump Inhibitors Passed - 06/08/2022  4:49 PM      Passed - Valid encounter within last 12 months    Recent Outpatient Visits          1 month ago Dental infection   [facility] Larae Grooms, NP   3 months ago Dental infection   [employer], Oswaldo Conroy, PA-C   8 months ago Anxiety and depression   [facility] Larae Grooms, NP   9 months ago Anxiety and depression   [facility] Larae Grooms, NP   11 months ago Breast abscess   [facility] Vigg, Avanti, MD             . atorvastatin (LIPITOR) 80 MG tablet [Pharmacy Med Name: ATORVASTATIN 80 MG TABLET] 30 tablet 0    Sig: Take 1 tablet (80 mg total) by mouth daily at 6 PM.     Cardiovascular:  Antilipid - Statins Failed - 06/08/2022  4:49 PM      Failed - Lipid Panel in normal range within the last 12 months    Cholesterol, Total  Date Value Ref Range Status  01/31/2021 121 100 - 199 mg/dL Final   Cholesterol  Date Value Ref Range Status  01/19/2014 268 (H) 0 - 200 mg/dL Final   Ldl Cholesterol, Calc  Date Value Ref Range Status  01/19/2014 167 (H) 0 - 100 mg/dL Final   LDL Chol Calc (NIH)  Date Value Ref Range Status  01/31/2021 59 0 - 99 mg/dL Final   HDL Cholesterol  Date Value Ref Range Status  01/19/2014 33 (L) 40 - 60 mg/dL Final   HDL  Date Value Ref Range Status  01/31/2021 43 >39 mg/dL Final   Triglycerides  Date Value Ref Range Status  01/31/2021 104 0 - 149 mg/dL Final  50/53/9767 341 (H) 0 - 200 mg/dL Final         Passed - Patient is not pregnant      Passed - Valid encounter within last 12 months    Recent Outpatient Visits          1 month ago Dental infection    [facility] Larae Grooms, NP   3 months ago Dental infection   [employer], Oswaldo Conroy, PA-C   8 months ago Anxiety and depression   [facility] Larae Grooms, NP   9 months ago Anxiety and depression   [facility] Larae Grooms, NP   11 months ago Breast abscess   [facility] Loura Pardon, MD

## 2022-06-09 NOTE — Telephone Encounter (Signed)
Requested medication (s) are due for refill today - provider review   Requested medication (s) are on the active medication list -yes  Future visit scheduled -no  Last refill: 08/06/21  Notes to clinic: medication listed as historical medication/provider- sent for review of request  Requested Prescriptions  Pending Prescriptions Disp Refills   clopidogrel (PLAVIX) 75 MG tablet [Pharmacy Med Name: CLOPIDOGREL 75 MG TABLET] 30 tablet 0    Sig: Take 1 tablet (75 mg total) by mouth daily.     Hematology: Antiplatelets - clopidogrel Failed - 06/08/2022  4:47 PM      Failed - HCT in normal range and within 180 days    HCT  Date Value Ref Range Status  03/20/2022 47.2 (H) 36.0 - 46.0 % Final   Hematocrit  Date Value Ref Range Status  09/22/2021 46.7 (H) 34.0 - 46.6 % Final         Failed - HGB in normal range and within 180 days    Hemoglobin  Date Value Ref Range Status  03/20/2022 16.3 (H) 12.0 - 15.0 g/dL Final  22/29/7989 21.1 11.1 - 15.9 g/dL Final         Failed - PLT in normal range and within 180 days    Platelets  Date Value Ref Range Status  03/20/2022 434 (H) 150 - 400 K/uL Final  09/22/2021 319 150 - 450 x10E3/uL Final         Passed - Cr in normal range and within 360 days    Creatinine  Date Value Ref Range Status  09/06/2014 0.97 0.60 - 1.30 mg/dL Final   Creatinine, Ser  Date Value Ref Range Status  03/20/2022 0.73 0.44 - 1.00 mg/dL Final         Passed - Valid encounter within last 6 months    Recent Outpatient Visits           1 month ago Dental infection   [facility] Larae Grooms, NP   3 months ago Dental infection   [employer], Oswaldo Conroy, PA-C   8 months ago Anxiety and depression   [facility] Larae Grooms, NP   9 months ago Anxiety and depression   [facility] [place], Clydie Braun, NP   11 months ago Breast abscess   [facility] Vigg, Avanti, MD                  Requested Prescriptions  Pending Prescriptions Disp Refills   clopidogrel (PLAVIX) 75 MG tablet [Pharmacy Med Name: CLOPIDOGREL 75 MG TABLET] 30 tablet 0    Sig: Take 1 tablet (75 mg total) by mouth daily.     Hematology: Antiplatelets - clopidogrel Failed - 06/08/2022  4:47 PM      Failed - HCT in normal range and within 180 days    HCT  Date Value Ref Range Status  03/20/2022 47.2 (H) 36.0 - 46.0 % Final   Hematocrit  Date Value Ref Range Status  09/22/2021 46.7 (H) 34.0 - 46.6 % Final         Failed - HGB in normal range and within 180 days    Hemoglobin  Date Value Ref Range Status  03/20/2022 16.3 (H) 12.0 - 15.0 g/dL Final  94/17/4081 44.8 11.1 - 15.9 g/dL Final         Failed - PLT in normal range and within 180 days    Platelets  Date Value Ref Range Status  03/20/2022 434 (H) 150 -  400 K/uL Final  09/22/2021 319 150 - 450 x10E3/uL Final         Passed - Cr in normal range and within 360 days    Creatinine  Date Value Ref Range Status  09/06/2014 0.97 0.60 - 1.30 mg/dL Final   Creatinine, Ser  Date Value Ref Range Status  03/20/2022 0.73 0.44 - 1.00 mg/dL Final         Passed - Valid encounter within last 6 months    Recent Outpatient Visits           1 month ago Dental infection   [facility] Larae Grooms, NP   3 months ago Dental infection   [employer], Oswaldo Conroy, PA-C   8 months ago Anxiety and depression   [facility] Larae Grooms, NP   9 months ago Anxiety and depression   [facility] Larae Grooms, NP   11 months ago Breast abscess   [facility] Loura Pardon, MD

## 2022-06-09 NOTE — Telephone Encounter (Signed)
Requested Prescriptions  Pending Prescriptions Disp Refills  . traZODone (DESYREL) 100 MG tablet [Pharmacy Med Name: TRAZODONE 100 MG TABLET] 180 tablet 0    Sig: Take 2 tablets (200 mg total) by mouth at bedtime as needed for sleep.     Psychiatry: Antidepressants - Serotonin Modulator Passed - 06/08/2022  4:48 PM      Passed - Completed PHQ-2 or PHQ-9 in the last 360 days      Passed - Valid encounter within last 6 months    Recent Outpatient Visits          1 month ago Dental infection   [place], NP   3 months ago Dental infection   [employer], Dani Gobble, PA-C   8 months ago Anxiety and depression   [employer], NP   9 months ago Anxiety and depression   [place], NP   11 months ago Breast abscess   [facility] Vigg, Avanti, MD             . hydrOXYzine (VISTARIL) 100 MG capsule [Pharmacy Med Name: HYDROXYZINE PAM 100 MG CAP] 90 capsule 0    Sig: Take 1 capsule (100 mg total) by mouth 3 (three) times daily as needed.     Ear, Nose, and Throat:  Antihistamines 2 Passed - 06/08/2022  4:48 PM      Passed - Cr in normal range and within 360 days    Creatinine  Date Value Ref Range Status  09/06/2014 0.97 0.60 - 1.30 mg/dL Final   Creatinine, Ser  Date Value Ref Range Status  03/20/2022 0.73 0.44 - 1.00 mg/dL Final         Passed - Valid encounter within last 12 months    Recent Outpatient Visits          1 month ago Dental infection   [place], NP   3 months ago Dental infection   [employer], Dani Gobble, PA-C   8 months ago Anxiety and depression   [place], NP   9 months ago Anxiety and depression   [place], NP   11 months ago Breast abscess   [facility] Vigg, Avanti, MD             .  hydrochlorothiazide (HYDRODIURIL) 25 MG tablet [Pharmacy Med Name: HYDROCHLOROTHIAZIDE 25 MG TAB] 90 tablet 0    Sig: Take 1 tablet (25 mg total) by mouth daily.     Cardiovascular: Diuretics - Thiazide Failed - 06/08/2022  4:48 PM      Failed - K in normal range and within 180 days    Potassium  Date Value Ref Range Status  03/20/2022 2.9 (L) 3.5 - 5.1 mmol/L Final  09/06/2014 3.8 3.5 - 5.1 mmol/L Final         Passed - Cr in normal range and within 180 days    Creatinine  Date Value Ref Range Status  09/06/2014 0.97 0.60 - 1.30 mg/dL Final   Creatinine, Ser  Date Value Ref Range Status  03/20/2022 0.73 0.44 - 1.00 mg/dL Final         Passed - Na in normal range and within 180 days    Sodium  Date Value Ref Range Status  03/20/2022 138 135 - 145 mmol/L Final  09/22/2021 138 134 - 144 mmol/L Final  09/06/2014 138 136 -  145 mmol/L Final         Passed - Last BP in normal range    BP Readings from Last 1 Encounters:  04/13/22 118/68         Passed - Valid encounter within last 6 months    Recent Outpatient Visits          1 month ago Dental infection   [place], NP   3 months ago Dental infection   [employer], Dani Gobble, PA-C   8 months ago Anxiety and depression   [facility] Jon Billings, NP   9 months ago Anxiety and depression   [place], NP   11 months ago Breast abscess   [facility] Vigg, Avanti, MD             . spironolactone (ALDACTONE) 25 MG tablet [Pharmacy Med Name: SPIRONOLACTONE 25 MG TABLET] 30 tablet 0    Sig: Take 1 tablet (25 mg total) by mouth daily.     Cardiovascular: Diuretics - Aldosterone Antagonist Failed - 06/08/2022  4:48 PM      Failed - K in normal range and within 180 days    Potassium  Date Value Ref Range Status  03/20/2022 2.9 (L) 3.5 - 5.1 mmol/L Final  09/06/2014 3.8 3.5 - 5.1 mmol/L Final         Passed -  Cr in normal range and within 180 days    Creatinine  Date Value Ref Range Status  09/06/2014 0.97 0.60 - 1.30 mg/dL Final   Creatinine, Ser  Date Value Ref Range Status  03/20/2022 0.73 0.44 - 1.00 mg/dL Final         Passed - Na in normal range and within 180 days    Sodium  Date Value Ref Range Status  03/20/2022 138 135 - 145 mmol/L Final  09/22/2021 138 134 - 144 mmol/L Final  09/06/2014 138 136 - 145 mmol/L Final         Passed - eGFR is 30 or above and within 180 days    EGFR (African American)  Date Value Ref Range Status  09/06/2014 >60 >6mL/min Final  01/21/2014 >60  Final   GFR calc Af Amer  Date Value Ref Range Status  02/19/2020 77 >59 mL/min/1.73 Final    Comment:    **[facility] currently reports eGFR in compliance with the current**   recommendations of the [employer]. [facility] will   update reporting as new guidelines are published from the NKF-ASN   Task force.    EGFR (Non-African Amer.)  Date Value Ref Range Status  09/06/2014 >60 >23mL/min Final    Comment:    eGFR values <57mL/min/1.73 m2 may be an indication of chronic kidney disease (CKD). Calculated eGFR, using the MRDR Study equation, is useful in  patients with stable renal function. The eGFR calculation will not be reliable in acutely ill patients when serum creatinine is changing rapidly. It is not useful in patients on dialysis. The eGFR calculation may not be applicable to patients at the low and high extremes of body sizes, pregnant women, and vegetarians.   01/21/2014 >60  Final    Comment:    eGFR values <60mL/min/1.73 m2 may be an indication of chronic kidney disease (CKD). Calculated eGFR is useful in patients with stable renal function. The eGFR calculation will not be reliable in acutely ill patients when serum creatinine is changing rapidly. It is not useful  in  patients on dialysis. The eGFR calculation may not be applicable to patients at the low and high  extremes of body sizes, pregnant women, and vegetarians. POTASSIUM - Slight hemolysis, interpret results with  - caution.    GFR, Estimated  Date Value Ref Range Status  03/20/2022 >60 >60 mL/min Final    Comment:    (NOTE) Calculated using the CKD-EPI Creatinine Equation (2021)    eGFR  Date Value Ref Range Status  09/22/2021 75 >59 mL/min/1.73 Final         Passed - Last BP in normal range    BP Readings from Last 1 Encounters:  04/13/22 118/68         Passed - Valid encounter within last 6 months    Recent Outpatient Visits          1 month ago Dental infection   [place], NP   3 months ago Dental infection   [employer], Dani Gobble, PA-C   8 months ago Anxiety and depression   [facility] Jon Billings, NP   9 months ago Anxiety and depression   [place], NP   11 months ago Breast abscess   [facility] Charlynne Cousins, MD

## 2022-08-01 ENCOUNTER — Ambulatory Visit: Payer: Self-pay | Admitting: *Deleted

## 2022-08-01 NOTE — Telephone Encounter (Signed)
  Chief Complaint: dizziness Symptoms: when stands Frequency: 2 days, has some diarrhea Pertinent Negatives: Patient denies fever Disposition: [] ED /[] Urgent Care (no appt availability in office) / [x] Appointment(In office/virtual)/ []  [place] [facility]/ [] Home Care/ [] Refused Recommended Disposition /[] [place] [facility]/ []  Follow-up with PCP Additional Notes: Pt refused appt tomorrow, took one for Thursday. Encouraged to drink pedialyte, home care discussed. Pt to call back if decides she wants to come tomorrow.   Reason for Disposition  [1] MODERATE dizziness (e.g., interferes with normal activities) AND [2] has been evaluated by doctor (or NP/PA) for this  Answer Assessment - Initial Assessment Questions 1. DESCRIPTION: "Describe your dizziness."     Tingles, worse when first stands up 2. LIGHTHEADED: "Do you feel lightheaded?" (e.g., somewhat faint, woozy, weak upon standing)     Yes, when first stand 3. VERTIGO: "Do you feel like either you or the room is spinning or tilting?" (i.e. vertigo)     Not spinning sometimes tilts 4. SEVERITY: "How bad is it?"  "Do you feel like you are going to faint?" "Can you stand and walk?"   - MILD: Feels slightly dizzy, but walking normally.   - MODERATE: Feels unsteady when walking, but not falling; interferes with normal activities (e.g., school, work).   - SEVERE: Unable to walk without falling, or requires assistance to walk without falling; feels like passing out now.      Moderate 6 5. ONSET:  "When did the dizziness begin?"     2 days 6. AGGRAVATING FACTORS: "Does anything make it worse?" (e.g., standing, change in head position)     standing 7. HEART RATE: "Can you tell me your heart rate?" "How many beats in 15 seconds?"  (Note: not all patients can do this)       HR normal 8. CAUSE: "What do you think is causing the dizziness?"     Unknown, have anxiety 9. RECURRENT SYMPTOM: "Have you had dizziness before?" If Yes, ask:  "When was the last time?" "What happened that time?"     occassionally 10. OTHER SYMPTOMS: "Do you have any other symptoms?" (e.g., fever, chest pain, vomiting, diarrhea, bleeding)       diarrhea 11. PREGNANCY: "Is there any chance you are pregnant?" "When was your last menstrual period?"       no  Protocols used: Dizziness - Lightheadedness-A-AH

## 2022-08-03 ENCOUNTER — Ambulatory Visit: Admitting: Physician Assistant

## 2022-08-29 ENCOUNTER — Other Ambulatory Visit: Payer: Self-pay | Admitting: Nurse Practitioner

## 2022-08-29 DIAGNOSIS — R61 Generalized hyperhidrosis: Secondary | ICD-10-CM

## 2022-08-29 DIAGNOSIS — R634 Abnormal weight loss: Secondary | ICD-10-CM

## 2022-08-29 DIAGNOSIS — K219 Gastro-esophageal reflux disease without esophagitis: Secondary | ICD-10-CM

## 2022-08-29 NOTE — Telephone Encounter (Signed)
Requested Prescriptions  Pending Prescriptions Disp Refills   pantoprazole (PROTONIX) 40 MG tablet [Pharmacy Med Name: PANTOPRAZOLE SOD DR 40 MG TAB] 90 tablet 0    Sig: Take 1 tablet (40 mg total) by mouth daily.     Gastroenterology: Proton Pump Inhibitors Passed - 08/29/2022  4:19 PM      Passed - Valid encounter within last 12 months    Recent Outpatient Visits           4 months ago Dental infection   [facility] Larae Grooms, NP   6 months ago Dental infection   [employer], Oswaldo Conroy, PA-C   11 months ago Anxiety and depression   [facility] Larae Grooms, NP   1 year ago Anxiety and depression   [facility] Larae Grooms, NP   1 year ago Breast abscess   [facility] Vigg, Avanti, MD               hydrochlorothiazide (HYDRODIURIL) 25 MG tablet [Pharmacy Med Name: HYDROCHLOROTHIAZIDE 25 MG TAB] 90 tablet 0    Sig: Take 1 tablet (25 mg total) by mouth daily.     Cardiovascular: Diuretics - Thiazide Failed - 08/29/2022  4:19 PM      Failed - K in normal range and within 180 days    Potassium  Date Value Ref Range Status  03/20/2022 2.9 (L) 3.5 - 5.1 mmol/L Final  09/06/2014 3.8 3.5 - 5.1 mmol/L Final         Passed - Cr in normal range and within 180 days    Creatinine  Date Value Ref Range Status  09/06/2014 0.97 0.60 - 1.30 mg/dL Final   Creatinine, Ser  Date Value Ref Range Status  03/20/2022 0.73 0.44 - 1.00 mg/dL Final         Passed - Na in normal range and within 180 days    Sodium  Date Value Ref Range Status  03/20/2022 138 135 - 145 mmol/L Final  09/22/2021 138 134 - 144 mmol/L Final  09/06/2014 138 136 - 145 mmol/L Final         Passed - Last BP in normal range    BP Readings from Last 1 Encounters:  04/13/22 118/68         Passed - Valid encounter within last 6 months    Recent Outpatient Visits           4 months ago Dental infection   [facility] Larae Grooms, NP   6 months ago Dental infection   [employer], Oswaldo Conroy, PA-C   11 months ago Anxiety and depression   [facility] Larae Grooms, NP   1 year ago Anxiety and depression   [facility] Larae Grooms, NP   1 year ago Breast abscess   [facility] Vigg, Avanti, MD               traZODone (DESYREL) 100 MG tablet [Pharmacy Med Name: TRAZODONE 100 MG TABLET] 180 tablet 0    Sig: Take 2 tablets (200 mg total) by mouth at bedtime as needed for sleep.     Psychiatry: Antidepressants - Serotonin Modulator Passed - 08/29/2022  4:19 PM      Passed - Completed PHQ-2 or PHQ-9 in the last 360 days      Passed - Valid encounter within last 6 months    Recent Outpatient Visits  4 months ago Dental infection   [facility] Larae Grooms, NP   6 months ago Dental infection   [employer], Oswaldo Conroy, PA-C   11 months ago Anxiety and depression   [facility] [place], Clydie Braun, NP   1 year ago Anxiety and depression   [facility] Larae Grooms, NP   1 year ago Breast abscess   [facility] Vigg, Avanti, MD               venlafaxine XR (EFFEXOR-XR) 75 MG 24 hr capsule [Pharmacy Med Name: VENLAFAXINE HCL ER 75 MG CAP] 90 capsule 0    Sig: Take 1 capsule (75 mg total) by mouth daily with breakfast. To take with Effexor 150mg .     Psychiatry: Antidepressants - SNRI - desvenlafaxine & venlafaxine Failed - 08/29/2022  4:19 PM      Failed - Lipid Panel in normal range within the last 12 months    Cholesterol, Total  Date Value Ref Range Status  01/31/2021 121 100 - 199 mg/dL Final   Cholesterol  Date Value Ref Range Status  01/19/2014 268 (H) 0 - 200 mg/dL Final   Ldl Cholesterol, Calc  Date Value Ref Range Status  01/19/2014 167 (H) 0 - 100 mg/dL Final   LDL Chol Calc (NIH)  Date Value Ref Range Status   01/31/2021 59 0 - 99 mg/dL Final   HDL Cholesterol  Date Value Ref Range Status  01/19/2014 33 (L) 40 - 60 mg/dL Final   HDL  Date Value Ref Range Status  01/31/2021 43 >39 mg/dL Final   Triglycerides  Date Value Ref Range Status  01/31/2021 104 0 - 149 mg/dL Final  02/02/2021 67/34/1937 (H) 0 - 200 mg/dL Final         Passed - Cr in normal range and within 360 days    Creatinine  Date Value Ref Range Status  09/06/2014 0.97 0.60 - 1.30 mg/dL Final   Creatinine, Ser  Date Value Ref Range Status  03/20/2022 0.73 0.44 - 1.00 mg/dL Final         Passed - Completed PHQ-2 or PHQ-9 in the last 360 days      Passed - Last BP in normal range    BP Readings from Last 1 Encounters:  04/13/22 118/68         Passed - Valid encounter within last 6 months    Recent Outpatient Visits           4 months ago Dental infection   [facility], NP   6 months ago Dental infection   Larae Grooms, [employer], PA-C   11 months ago Anxiety and depression   [facility] [place], NP   1 year ago Anxiety and depression   [facility], NP   1 year ago Breast abscess   [facility] Vigg, Avanti, MD               venlafaxine XR (EFFEXOR-XR) 150 MG 24 hr capsule [Pharmacy Med Name: VENLAFAXINE HCL ER 150 MG CAP] 45 capsule 0    Sig: Take 1 capsule (150 mg total) by mouth daily with breakfast.     Psychiatry: Antidepressants - SNRI - desvenlafaxine & venlafaxine Failed - 08/29/2022  4:19 PM      Failed - Lipid Panel in normal range within the last 12 months    Cholesterol, Total  Date Value  Ref Range Status  01/31/2021 121 100 - 199 mg/dL Final   Cholesterol  Date Value Ref Range Status  01/19/2014 268 (H) 0 - 200 mg/dL Final   Ldl Cholesterol, Calc  Date Value Ref Range Status  01/19/2014 167 (H) 0 - 100 mg/dL Final   LDL Chol Calc (NIH)  Date Value Ref Range Status   01/31/2021 59 0 - 99 mg/dL Final   HDL Cholesterol  Date Value Ref Range Status  01/19/2014 33 (L) 40 - 60 mg/dL Final   HDL  Date Value Ref Range Status  01/31/2021 43 >39 mg/dL Final   Triglycerides  Date Value Ref Range Status  01/31/2021 104 0 - 149 mg/dL Final  16/07/9603 540 (H) 0 - 200 mg/dL Final         Passed - Cr in normal range and within 360 days    Creatinine  Date Value Ref Range Status  09/06/2014 0.97 0.60 - 1.30 mg/dL Final   Creatinine, Ser  Date Value Ref Range Status  03/20/2022 0.73 0.44 - 1.00 mg/dL Final         Passed - Completed PHQ-2 or PHQ-9 in the last 360 days      Passed - Last BP in normal range    BP Readings from Last 1 Encounters:  04/13/22 118/68         Passed - Valid encounter within last 6 months    Recent Outpatient Visits           4 months ago Dental infection   [facility] Larae Grooms, NP   6 months ago Dental infection   [employer], Oswaldo Conroy, PA-C   11 months ago Anxiety and depression   [facility] Larae Grooms, NP   1 year ago Anxiety and depression   [facility] Larae Grooms, NP   1 year ago Breast abscess   [facility] Loura Pardon, MD

## 2022-08-29 NOTE — Telephone Encounter (Signed)
Last seen 04/13/2022. No future appt scheduled with PCP. Please advise.

## 2022-08-29 NOTE — Telephone Encounter (Signed)
Requested medication (s) are due for refill today - yes  Requested medication (s) are on the active medication list -yes  Future visit scheduled -no  Last refill: hydrochlorothiazide-06/09/22 #90- abnormal outside labs                  Trazodone- 06/09/22 #180- no 6 month f/u- acute visits only                   Venlafaxine- 75mg - 09/22/21 #90 1RF                                        150mg - 03/22/22 #60          - no 6 month f/u- acute visits only  Notes to clinic: see above- sent for provider review   Requested Prescriptions  Pending Prescriptions Disp Refills   hydrochlorothiazide (HYDRODIURIL) 25 MG tablet [Pharmacy Med Name: HYDROCHLOROTHIAZIDE 25 MG TAB] 90 tablet 0    Sig: Take 1 tablet (25 mg total) by mouth daily.     Cardiovascular: Diuretics - Thiazide Failed - 08/29/2022  4:19 PM      Failed - K in normal range and within 180 days    Potassium  Date Value Ref Range Status  03/20/2022 2.9 (L) 3.5 - 5.1 mmol/L Final  09/06/2014 3.8 3.5 - 5.1 mmol/L Final         Passed - Cr in normal range and within 180 days    Creatinine  Date Value Ref Range Status  09/06/2014 0.97 0.60 - 1.30 mg/dL Final   Creatinine, Ser  Date Value Ref Range Status  03/20/2022 0.73 0.44 - 1.00 mg/dL Final         Passed - Na in normal range and within 180 days    Sodium  Date Value Ref Range Status  03/20/2022 138 135 - 145 mmol/L Final  09/22/2021 138 134 - 144 mmol/L Final  09/06/2014 138 136 - 145 mmol/L Final         Passed - Last BP in normal range    BP Readings from Last 1 Encounters:  04/13/22 118/68         Passed - Valid encounter within last 6 months    Recent Outpatient Visits           4 months ago [facility] infection   [facility]   6 months ago [facility]   [facility] [place] Oswaldo Conroyrin E, PA-C   11 months ago Anxiety and depression   [facility] Larae GroomsHoldsworth, Karen, NP   1 year ago Anxiety and depression    [facility], Karen, NP   1 year ago Breast abscess   [facility] Vigg, Avanti, MD               traZODone (DESYREL) 100 MG tablet [Pharmacy Med Name: TRAZODONE 100 MG TABLET] 180 tablet 0    Sig: Take 2 tablets (200 mg total) by mouth at bedtime as needed for sleep.     Psychiatry: Antidepressants - Serotonin Modulator Passed - 08/29/2022  4:19 PM      Passed - Completed PHQ-2 or PHQ-9 in the last 360 days      Passed - Valid encounter within last 6 months    Recent Outpatient Visits           4 months ago  Dental infection   [facility] Larae Grooms, NP   6 months ago Dental infection   [employer], Oswaldo Conroy, PA-C   11 months ago Anxiety and depression   [facility] [place], Clydie Braun, NP   1 year ago Anxiety and depression   [facility] Larae Grooms, NP   1 year ago Breast abscess   [facility] Vigg, Avanti, MD               venlafaxine XR (EFFEXOR-XR) 75 MG 24 hr capsule [Pharmacy Med Name: VENLAFAXINE HCL ER 75 MG CAP] 90 capsule 0    Sig: Take 1 capsule (75 mg total) by mouth daily with breakfast. To take with Effexor 150mg .     Psychiatry: Antidepressants - SNRI - desvenlafaxine & venlafaxine Failed - 08/29/2022  4:19 PM      Failed - Lipid Panel in normal range within the last 12 months    Cholesterol, Total  Date Value Ref Range Status  01/31/2021 121 100 - 199 mg/dL Final   Cholesterol  Date Value Ref Range Status  01/19/2014 268 (H) 0 - 200 mg/dL Final   Ldl Cholesterol, Calc  Date Value Ref Range Status  01/19/2014 167 (H) 0 - 100 mg/dL Final   LDL Chol Calc (NIH)  Date Value Ref Range Status  01/31/2021 59 0 - 99 mg/dL Final   HDL Cholesterol  Date Value Ref Range Status  01/19/2014 33 (L) 40 - 60 mg/dL Final   HDL  Date Value Ref Range Status  01/31/2021 43 >39 mg/dL Final   Triglycerides  Date Value Ref Range Status   01/31/2021 104 0 - 149 mg/dL Final  02/02/2021 25/85/2778 (H) 0 - 200 mg/dL Final         Passed - Cr in normal range and within 360 days    Creatinine  Date Value Ref Range Status  09/06/2014 0.97 0.60 - 1.30 mg/dL Final   Creatinine, Ser  Date Value Ref Range Status  03/20/2022 0.73 0.44 - 1.00 mg/dL Final         Passed - Completed PHQ-2 or PHQ-9 in the last 360 days      Passed - Last BP in normal range    BP Readings from Last 1 Encounters:  04/13/22 118/68         Passed - Valid encounter within last 6 months    Recent Outpatient Visits           4 months ago Dental infection   [facility], NP   6 months ago Dental infection   Larae Grooms, [employer], PA-C   11 months ago Anxiety and depression   [facility] [place], [place], NP   1 year ago Anxiety and depression   [facility], NP   1 year ago Breast abscess   [facility] Vigg, Avanti, MD               venlafaxine XR (EFFEXOR-XR) 150 MG 24 hr capsule [Pharmacy Med Name: VENLAFAXINE HCL ER 150 MG CAP] 45 capsule 0    Sig: Take 1 capsule (150 mg total) by mouth daily with breakfast.     Psychiatry: Antidepressants - SNRI - desvenlafaxine & venlafaxine Failed - 08/29/2022  4:19 PM      Failed - Lipid Panel in normal range within the last 12 months    Cholesterol, Total  Date Value Ref Range Status  01/31/2021 121 100 - 199 mg/dL Final   Cholesterol  Date Value Ref Range Status  01/19/2014 268 (H) 0 - 200 mg/dL Final   Ldl Cholesterol, Calc  Date Value Ref Range Status  01/19/2014 167 (H) 0 - 100 mg/dL Final   LDL Chol Calc (NIH)  Date Value Ref Range Status  01/31/2021 59 0 - 99 mg/dL Final   HDL Cholesterol  Date Value Ref Range Status  01/19/2014 33 (L) 40 - 60 mg/dL Final   HDL  Date Value Ref Range Status  01/31/2021 43 >39 mg/dL Final   Triglycerides  Date Value Ref Range Status   01/31/2021 104 0 - 149 mg/dL Final  16/07/9603 540 (H) 0 - 200 mg/dL Final         Passed - Cr in normal range and within 360 days    Creatinine  Date Value Ref Range Status  09/06/2014 0.97 0.60 - 1.30 mg/dL Final   Creatinine, Ser  Date Value Ref Range Status  03/20/2022 0.73 0.44 - 1.00 mg/dL Final         Passed - Completed PHQ-2 or PHQ-9 in the last 360 days      Passed - Last BP in normal range    BP Readings from Last 1 Encounters:  04/13/22 118/68         Passed - Valid encounter within last 6 months    Recent Outpatient Visits           4 months ago Dental infection   [facility] Larae Grooms, NP   6 months ago Dental infection   [employer], Oswaldo Conroy, PA-C   11 months ago Anxiety and depression   [facility] Larae Grooms, NP   1 year ago Anxiety and depression   [facility] [place], Clydie Braun, NP   1 year ago Breast abscess   [facility] Vigg, Avanti, MD              Signed Prescriptions Disp Refills   pantoprazole (PROTONIX) 40 MG tablet 90 tablet 0    Sig: Take 1 tablet (40 mg total) by mouth daily.     Gastroenterology: Proton Pump Inhibitors Passed - 08/29/2022  4:19 PM      Passed - Valid encounter within last 12 months    Recent Outpatient Visits           4 months ago Dental infection   [facility] Larae Grooms, NP   6 months ago Dental infection   [employer], Oswaldo Conroy, PA-C   11 months ago Anxiety and depression   [facility] Larae Grooms, NP   1 year ago Anxiety and depression   [facility] Larae Grooms, NP   1 year ago Breast abscess   [facility] Vigg, Avanti, MD                 Requested Prescriptions  Pending Prescriptions Disp Refills   hydrochlorothiazide (HYDRODIURIL) 25 MG tablet [Pharmacy Med Name: HYDROCHLOROTHIAZIDE 25 MG TAB] 90 tablet 0    Sig:  Take 1 tablet (25 mg total) by mouth daily.     Cardiovascular: Diuretics - Thiazide Failed - 08/29/2022  4:19 PM      Failed - K in normal range and within 180 days    Potassium  Date Value Ref Range Status  03/20/2022 2.9 (L) 3.5 - 5.1 mmol/L Final  09/06/2014 3.8 3.5 - 5.1 mmol/L Final  Passed - Cr in normal range and within 180 days    Creatinine  Date Value Ref Range Status  09/06/2014 0.97 0.60 - 1.30 mg/dL Final   Creatinine, Ser  Date Value Ref Range Status  03/20/2022 0.73 0.44 - 1.00 mg/dL Final         Passed - Na in normal range and within 180 days    Sodium  Date Value Ref Range Status  03/20/2022 138 135 - 145 mmol/L Final  09/22/2021 138 134 - 144 mmol/L Final  09/06/2014 138 136 - 145 mmol/L Final         Passed - Last BP in normal range    BP Readings from Last 1 Encounters:  04/13/22 118/68         Passed - Valid encounter within last 6 months    Recent Outpatient Visits           4 months ago Dental infection   [facility] Larae Grooms, NP   6 months ago Dental infection   [employer], Oswaldo Conroy, PA-C   11 months ago Anxiety and depression   [facility] Larae Grooms, NP   1 year ago Anxiety and depression   [facility] [place], Clydie Braun, NP   1 year ago Breast abscess   [facility] Vigg, Avanti, MD               traZODone (DESYREL) 100 MG tablet [Pharmacy Med Name: TRAZODONE 100 MG TABLET] 180 tablet 0    Sig: Take 2 tablets (200 mg total) by mouth at bedtime as needed for sleep.     Psychiatry: Antidepressants - Serotonin Modulator Passed - 08/29/2022  4:19 PM      Passed - Completed PHQ-2 or PHQ-9 in the last 360 days      Passed - Valid encounter within last 6 months    Recent Outpatient Visits           4 months ago Dental infection   [facility] Larae Grooms, NP   6 months ago Dental infection   [employer], Oswaldo Conroy, PA-C   11 months ago Anxiety and depression   [facility] [place], Clydie Braun, NP   1 year ago Anxiety and depression   [facility] Larae Grooms, NP   1 year ago Breast abscess   [facility] Vigg, Avanti, MD               venlafaxine XR (EFFEXOR-XR) 75 MG 24 hr capsule [Pharmacy Med Name: VENLAFAXINE HCL ER 75 MG CAP] 90 capsule 0    Sig: Take 1 capsule (75 mg total) by mouth daily with breakfast. To take with Effexor 150mg .     Psychiatry: Antidepressants - SNRI - desvenlafaxine & venlafaxine Failed - 08/29/2022  4:19 PM      Failed - Lipid Panel in normal range within the last 12 months    Cholesterol, Total  Date Value Ref Range Status  01/31/2021 121 100 - 199 mg/dL Final   Cholesterol  Date Value Ref Range Status  01/19/2014 268 (H) 0 - 200 mg/dL Final   Ldl Cholesterol, Calc  Date Value Ref Range Status  01/19/2014 167 (H) 0 - 100 mg/dL Final   LDL Chol Calc (NIH)  Date Value Ref Range Status  01/31/2021 59 0 - 99 mg/dL Final   HDL Cholesterol  Date Value Ref Range Status  01/19/2014 33 (L) 40 - 60  mg/dL Final   HDL  Date Value Ref Range Status  01/31/2021 43 >39 mg/dL Final   Triglycerides  Date Value Ref Range Status  01/31/2021 104 0 - 149 mg/dL Final  82/95/6213 086 (H) 0 - 200 mg/dL Final         Passed - Cr in normal range and within 360 days    Creatinine  Date Value Ref Range Status  09/06/2014 0.97 0.60 - 1.30 mg/dL Final   Creatinine, Ser  Date Value Ref Range Status  03/20/2022 0.73 0.44 - 1.00 mg/dL Final         Passed - Completed PHQ-2 or PHQ-9 in the last 360 days      Passed - Last BP in normal range    BP Readings from Last 1 Encounters:  04/13/22 118/68         Passed - Valid encounter within last 6 months    Recent Outpatient Visits           4 months ago Dental infection   [facility] Larae Grooms, NP   6 months ago Dental infection   [employer], Oswaldo Conroy, PA-C   11 months ago Anxiety and depression   [facility] [place], Clydie Braun, NP   1 year ago Anxiety and depression   [facility] Larae Grooms, NP   1 year ago Breast abscess   [facility] Vigg, Avanti, MD               venlafaxine XR (EFFEXOR-XR) 150 MG 24 hr capsule [Pharmacy Med Name: VENLAFAXINE HCL ER 150 MG CAP] 45 capsule 0    Sig: Take 1 capsule (150 mg total) by mouth daily with breakfast.     Psychiatry: Antidepressants - SNRI - desvenlafaxine & venlafaxine Failed - 08/29/2022  4:19 PM      Failed - Lipid Panel in normal range within the last 12 months    Cholesterol, Total  Date Value Ref Range Status  01/31/2021 121 100 - 199 mg/dL Final   Cholesterol  Date Value Ref Range Status  01/19/2014 268 (H) 0 - 200 mg/dL Final   Ldl Cholesterol, Calc  Date Value Ref Range Status  01/19/2014 167 (H) 0 - 100 mg/dL Final   LDL Chol Calc (NIH)  Date Value Ref Range Status  01/31/2021 59 0 - 99 mg/dL Final   HDL Cholesterol  Date Value Ref Range Status  01/19/2014 33 (L) 40 - 60 mg/dL Final   HDL  Date Value Ref Range Status  01/31/2021 43 >39 mg/dL Final   Triglycerides  Date Value Ref Range Status  01/31/2021 104 0 - 149 mg/dL Final  57/84/6962 952 (H) 0 - 200 mg/dL Final         Passed - Cr in normal range and within 360 days    Creatinine  Date Value Ref Range Status  09/06/2014 0.97 0.60 - 1.30 mg/dL Final   Creatinine, Ser  Date Value Ref Range Status  03/20/2022 0.73 0.44 - 1.00 mg/dL Final         Passed - Completed PHQ-2 or PHQ-9 in the last 360 days      Passed - Last BP in normal range    BP Readings from Last 1 Encounters:  04/13/22 118/68         Passed - Valid encounter within last 6 months    Recent Outpatient Visits           4  months ago Dental infection   [facility] Larae Grooms, NP   6 months ago Dental infection   [employer], Oswaldo Conroy, PA-C   11 months ago Anxiety and depression   [facility] Larae Grooms, NP   1 year ago Anxiety and depression   [facility] Larae Grooms, NP   1 year ago Breast abscess   [facility] Vigg, Avanti, MD              Signed Prescriptions Disp Refills   pantoprazole (PROTONIX) 40 MG tablet 90 tablet 0    Sig: Take 1 tablet (40 mg total) by mouth daily.     Gastroenterology: Proton Pump Inhibitors Passed - 08/29/2022  4:19 PM      Passed - Valid encounter within last 12 months    Recent Outpatient Visits           4 months ago Dental infection   [facility] Larae Grooms, NP   6 months ago Dental infection   [employer], Oswaldo Conroy, PA-C   11 months ago Anxiety and depression   [facility] Larae Grooms, NP   1 year ago Anxiety and depression   [facility] Larae Grooms, NP   1 year ago Breast abscess   [facility] Loura Pardon, MD

## 2022-09-17 DIAGNOSIS — Z5982 Transportation insecurity: Secondary | ICD-10-CM | POA: Diagnosis not present

## 2022-09-17 DIAGNOSIS — E669 Obesity, unspecified: Secondary | ICD-10-CM | POA: Diagnosis not present

## 2022-09-17 DIAGNOSIS — Z803 Family history of malignant neoplasm of breast: Secondary | ICD-10-CM | POA: Diagnosis not present

## 2022-09-17 DIAGNOSIS — Z720 Tobacco use: Secondary | ICD-10-CM | POA: Diagnosis not present

## 2022-09-17 DIAGNOSIS — M1990 Unspecified osteoarthritis, unspecified site: Secondary | ICD-10-CM | POA: Diagnosis not present

## 2022-09-17 DIAGNOSIS — Z6832 Body mass index (BMI) 32.0-32.9, adult: Secondary | ICD-10-CM | POA: Diagnosis not present

## 2022-09-17 DIAGNOSIS — G4700 Insomnia, unspecified: Secondary | ICD-10-CM | POA: Diagnosis not present

## 2022-09-17 DIAGNOSIS — Z8249 Family history of ischemic heart disease and other diseases of the circulatory system: Secondary | ICD-10-CM | POA: Diagnosis not present

## 2022-09-17 DIAGNOSIS — R69 Illness, unspecified: Secondary | ICD-10-CM | POA: Diagnosis not present

## 2022-09-17 DIAGNOSIS — Z5948 Other specified lack of adequate food: Secondary | ICD-10-CM | POA: Diagnosis not present

## 2022-09-17 DIAGNOSIS — I10 Essential (primary) hypertension: Secondary | ICD-10-CM | POA: Diagnosis not present

## 2022-09-17 DIAGNOSIS — R32 Unspecified urinary incontinence: Secondary | ICD-10-CM | POA: Diagnosis not present

## 2022-09-25 ENCOUNTER — Other Ambulatory Visit: Payer: Self-pay | Admitting: Nurse Practitioner

## 2022-09-25 DIAGNOSIS — R61 Generalized hyperhidrosis: Secondary | ICD-10-CM

## 2022-09-25 DIAGNOSIS — R634 Abnormal weight loss: Secondary | ICD-10-CM

## 2022-10-11 ENCOUNTER — Other Ambulatory Visit: Payer: Self-pay | Admitting: Nurse Practitioner

## 2022-10-11 DIAGNOSIS — R634 Abnormal weight loss: Secondary | ICD-10-CM

## 2022-10-11 DIAGNOSIS — R61 Generalized hyperhidrosis: Secondary | ICD-10-CM

## 2022-10-11 DIAGNOSIS — K219 Gastro-esophageal reflux disease without esophagitis: Secondary | ICD-10-CM

## 2022-10-11 NOTE — Telephone Encounter (Signed)
Requested Prescriptions  Pending Prescriptions Disp Refills   venlafaxine XR (EFFEXOR-XR) 150 MG 24 hr capsule [Pharmacy Med Name: VENLAFAXINE HCL ER 150 MG CAP] 30 capsule 0    Sig: Take 1 capsule (150 mg total) by mouth daily with breakfast.     Psychiatry: Antidepressants - SNRI - desvenlafaxine & venlafaxine Failed - 10/11/2022  3:20 PM      Failed - Valid encounter within last 6 months    Recent Outpatient Visits           6 months ago Dental infection   [facility] Jon Billings, NP   8 months ago Dental infection   [employer], Dani Gobble, PA-C   1 year ago Anxiety and depression   [facility] [place], Santiago Glad, NP   1 year ago Anxiety and depression   [facility] Jon Billings, NP   1 year ago Breast abscess   [facility] Vigg, Avanti, MD       Future Appointments             In 2 weeks Jon Billings, NP [place], [facility]            Failed - Lipid Panel in normal range within the last 12 months    Cholesterol, Total  Date Value Ref Range Status  01/31/2021 121 100 - 199 mg/dL Final   Cholesterol  Date Value Ref Range Status  01/19/2014 268 (H) 0 - 200 mg/dL Final   Ldl Cholesterol, Calc  Date Value Ref Range Status  01/19/2014 167 (H) 0 - 100 mg/dL Final   LDL Chol Calc (NIH)  Date Value Ref Range Status  01/31/2021 59 0 - 99 mg/dL Final   HDL Cholesterol  Date Value Ref Range Status  01/19/2014 33 (L) 40 - 60 mg/dL Final   HDL  Date Value Ref Range Status  01/31/2021 43 >39 mg/dL Final   Triglycerides  Date Value Ref Range Status  01/31/2021 104 0 - 149 mg/dL Final  01/19/2014 340 (H) 0 - 200 mg/dL Final         Passed - Cr in normal range and within 360 days    Creatinine  Date Value Ref Range Status  09/06/2014 0.97 0.60 - 1.30 mg/dL Final   Creatinine, Ser  Date Value Ref Range Status  03/20/2022 0.73 0.44 - 1.00 mg/dL Final         Passed -  Completed PHQ-2 or PHQ-9 in the last 360 days      Passed - Last BP in normal range    BP Readings from Last 1 Encounters:  04/13/22 118/68          venlafaxine XR (EFFEXOR-XR) 75 MG 24 hr capsule [Pharmacy Med Name: VENLAFAXINE HCL ER 75 MG CAP] 30 capsule 0    Sig: Take 1 capsule (75 mg total) by mouth daily with breakfast. To take with Effexor 150mg .     Psychiatry: Antidepressants - SNRI - desvenlafaxine & venlafaxine Failed - 10/11/2022  3:20 PM      Failed - Valid encounter within last 6 months    Recent Outpatient Visits           6 months ago Dental infection   [place], NP   8 months ago Dental infection   [employer], Dani Gobble, PA-C   1 year ago Anxiety and depression   [place], NP   1 year  ago Anxiety and depression   [facility] Larae Grooms, NP   1 year ago Breast abscess   [facility] Vigg, Avanti, MD       Future Appointments             In 2 weeks Larae Grooms, NP [facility], [facility]            Failed - Lipid Panel in normal range within the last 12 months    Cholesterol, Total  Date Value Ref Range Status  01/31/2021 121 100 - 199 mg/dL Final   Cholesterol  Date Value Ref Range Status  01/19/2014 268 (H) 0 - 200 mg/dL Final   Ldl Cholesterol, Calc  Date Value Ref Range Status  01/19/2014 167 (H) 0 - 100 mg/dL Final   LDL Chol Calc (NIH)  Date Value Ref Range Status  01/31/2021 59 0 - 99 mg/dL Final   HDL Cholesterol  Date Value Ref Range Status  01/19/2014 33 (L) 40 - 60 mg/dL Final   HDL  Date Value Ref Range Status  01/31/2021 43 >39 mg/dL Final   Triglycerides  Date Value Ref Range Status  01/31/2021 104 0 - 149 mg/dL Final  65/78/4696 295 (H) 0 - 200 mg/dL Final         Passed - Cr in normal range and within 360 days    Creatinine  Date Value Ref Range Status  09/06/2014 0.97 0.60 - 1.30 mg/dL  Final   Creatinine, Ser  Date Value Ref Range Status  03/20/2022 0.73 0.44 - 1.00 mg/dL Final         Passed - Completed PHQ-2 or PHQ-9 in the last 360 days      Passed - Last BP in normal range    BP Readings from Last 1 Encounters:  04/13/22 118/68          hydrochlorothiazide (HYDRODIURIL) 25 MG tablet [Pharmacy Med Name: HYDROCHLOROTHIAZIDE 25 MG TAB] 15 tablet 0    Sig: Take 1 tablet (25 mg total) by mouth daily.     Cardiovascular: Diuretics - Thiazide Failed - 10/11/2022  3:20 PM      Failed - Cr in normal range and within 180 days    Creatinine  Date Value Ref Range Status  09/06/2014 0.97 0.60 - 1.30 mg/dL Final   Creatinine, Ser  Date Value Ref Range Status  03/20/2022 0.73 0.44 - 1.00 mg/dL Final         Failed - K in normal range and within 180 days    Potassium  Date Value Ref Range Status  03/20/2022 2.9 (L) 3.5 - 5.1 mmol/L Final  09/06/2014 3.8 3.5 - 5.1 mmol/L Final         Failed - Na in normal range and within 180 days    Sodium  Date Value Ref Range Status  03/20/2022 138 135 - 145 mmol/L Final  09/22/2021 138 134 - 144 mmol/L Final  09/06/2014 138 136 - 145 mmol/L Final         Failed - Valid encounter within last 6 months    Recent Outpatient Visits           6 months ago Dental infection   [facility] Larae Grooms, NP   8 months ago Dental infection   [employer], Oswaldo Conroy, PA-C   1 year ago Anxiety and depression   [facility] Larae Grooms, NP   1 year ago Anxiety and depression  [place], NP   1 year ago Breast abscess   [place], MD       Future Appointments             In 2 weeks Jon Billings, NP [place], [facility]            Passed - Last BP in normal range    BP Readings from Last 1 Encounters:  04/13/22 118/68          traZODone (DESYREL) 100 MG tablet [Pharmacy Med Name: TRAZODONE  100 MG TABLET] 60 tablet 0    Sig: Take 2 tablets (200 mg total) by mouth at bedtime as needed for sleep.     Psychiatry: Antidepressants - Serotonin Modulator Failed - 10/11/2022  3:20 PM      Failed - Valid encounter within last 6 months    Recent Outpatient Visits           6 months ago Dental infection   [place], NP   8 months ago Dental infection   [employer], Dani Gobble, PA-C   1 year ago Anxiety and depression   [facility] Jon Billings, NP   1 year ago Anxiety and depression   [facility] Jon Billings, NP   1 year ago Breast abscess   [place], MD       Future Appointments             In 2 weeks Jon Billings, NP [facility], [place] - Completed PHQ-2 or PHQ-9 in the last 360 days

## 2022-10-12 NOTE — Telephone Encounter (Signed)
Rx 08/29/22 #90 - too soon Requested Prescriptions  Pending Prescriptions Disp Refills   pantoprazole (PROTONIX) 40 MG tablet [Pharmacy Med Name: PANTOPRAZOLE SOD DR 40 MG TAB] 90 tablet 0    Sig: Take 1 tablet (40 mg total) by mouth daily.     Gastroenterology: Proton Pump Inhibitors Passed - 10/11/2022  3:21 PM      Passed - Valid encounter within last 12 months    Recent Outpatient Visits           6 months ago Dental infection   [place], NP   8 months ago Dental infection   [employer], Dani Gobble, PA-C   1 year ago Anxiety and depression   [facility] Jon Billings, NP   1 year ago Anxiety and depression   [facility] Jon Billings, NP   1 year ago Breast abscess   [facility] Charlynne Cousins, MD       Future Appointments             In 2 weeks Jon Billings, NP [facility], [place]

## 2022-10-16 ENCOUNTER — Other Ambulatory Visit: Payer: Self-pay | Admitting: Nurse Practitioner

## 2022-10-17 NOTE — Telephone Encounter (Signed)
Requested medication (s) are due for refill today - yes  Requested medication (s) are on the active medication list -yes  Future visit scheduled -yes  Last refill: 04/13/22 #20  Notes to clinic: non delegated Rx  Requested Prescriptions  Pending Prescriptions Disp Refills   ondansetron (ZOFRAN-ODT) 4 MG disintegrating tablet [Pharmacy Med Name: ONDANSETRON ODT 4 MG TABLET] 20 tablet 0    Sig: Take 1 tablet (4 mg total) by mouth every 8 (eight) hours as needed for nausea or vomiting.     Not Delegated - Gastroenterology: Antiemetics - ondansetron Failed - 10/16/2022  4:03 PM      Failed - This refill cannot be delegated      Failed - Valid encounter within last 6 months    Recent Outpatient Visits           6 months ago Dental infection   [facility] Larae Grooms, NP   8 months ago Dental infection   [employer], Oswaldo Conroy, PA-C   1 year ago Anxiety and depression   [facility] Larae Grooms, NP   1 year ago Anxiety and depression   [facility] Larae Grooms, NP   1 year ago Breast abscess   [facility] Vigg, Avanti, MD       Future Appointments             In 1 week Larae Grooms, NP [facility], [facility]            Passed - AST in normal range and within 360 days    AST  Date Value Ref Range Status  12/26/2021 19 15 - 41 U/L Final   SGOT(AST)  Date Value Ref Range Status  09/06/2014 < 5 (L) 15 - 37 Unit/L Final         Passed - ALT in normal range and within 360 days    ALT  Date Value Ref Range Status  12/26/2021 20 0 - 44 U/L Final   SGPT (ALT)  Date Value Ref Range Status  09/06/2014 21 U/L Final    Comment:    14-63 NOTE: New Reference Range 04/28/14             Requested Prescriptions  Pending Prescriptions Disp Refills   ondansetron (ZOFRAN-ODT) 4 MG disintegrating tablet [Pharmacy Med Name: ONDANSETRON ODT 4 MG TABLET] 20 tablet 0    Sig: Take 1  tablet (4 mg total) by mouth every 8 (eight) hours as needed for nausea or vomiting.     Not Delegated - Gastroenterology: Antiemetics - ondansetron Failed - 10/16/2022  4:03 PM      Failed - This refill cannot be delegated      Failed - Valid encounter within last 6 months    Recent Outpatient Visits           6 months ago Dental infection   [facility] Larae Grooms, NP   8 months ago Dental infection   [employer], Oswaldo Conroy, PA-C   1 year ago Anxiety and depression   [facility] Larae Grooms, NP   1 year ago Anxiety and depression   [facility] Larae Grooms, NP   1 year ago Breast abscess   [facility] Loura Pardon, MD       Future Appointments             In 1 week Larae Grooms, NP [facility], [facility]  Passed - AST in normal range and within 360 days    AST  Date Value Ref Range Status  12/26/2021 19 15 - 41 U/L Final   SGOT(AST)  Date Value Ref Range Status  09/06/2014 < 5 (L) 15 - 37 Unit/L Final         Passed - ALT in normal range and within 360 days    ALT  Date Value Ref Range Status  12/26/2021 20 0 - 44 U/L Final   SGPT (ALT)  Date Value Ref Range Status  09/06/2014 21 U/L Final    Comment:    14-63 NOTE: New Reference Range 04/28/14

## 2022-10-26 ENCOUNTER — Ambulatory Visit: Admitting: Nurse Practitioner

## 2022-10-26 DIAGNOSIS — R7303 Prediabetes: Secondary | ICD-10-CM | POA: Insufficient documentation

## 2022-10-26 NOTE — Progress Notes (Deleted)
There were no vitals taken for this visit.   Subjective:    Patient ID: Latasha Lawrence, female    DOB: 1970-10-27, 52 y.o.   MRN: [id number]  HPI: [name] is a 52 y.o. female  No chief complaint on file.  HYPERTENSION / HYPERLIPIDEMIA Satisfied with current treatment? {Blank single:19197::"yes","no"} Duration of hypertension: {Blank single:19197::"chronic","months","years"} BP monitoring frequency: {Blank single:19197::"not checking","rarely","daily","weekly","monthly","a few times a day","a few times a week","a few times a month"} BP range:  BP medication side effects: {Blank single:19197::"yes","no"} Past BP meds: {Blank A999333 (bystolic)","carvedilol","chlorthalidone","clonidine","diltiazem","exforge HCT","HCTZ","irbesartan (avapro)","labetalol","lisinopril","lisinopril-HCTZ","losartan (cozaar)","methyldopa","nifedipine","olmesartan (benicar)","olmesartan-HCTZ","quinapril","ramipril","spironalactone","tekturna","valsartan","valsartan-HCTZ","verapamil"} Duration of hyperlipidemia: {Blank single:19197::"chronic","months","years"} Cholesterol medication side effects: {Blank single:19197::"yes","no"} Cholesterol supplements: {Blank multiple:19196::"none","fish oil","niacin","red yeast rice"} Past cholesterol medications: {Blank multiple:19196::"none","atorvastain (lipitor)","lovastatin (mevacor)","pravastatin (pravachol)","rosuvastatin (crestor)","simvastatin (zocor)","vytorin","fenofibrate (tricor)","gemfibrozil","ezetimide (zetia)","niaspan","lovaza"} Medication compliance: {Blank single:19197::"excellent compliance","good compliance","fair compliance","poor compliance"} Aspirin: {Blank single:19197::"yes","no"} Recent stressors: {Blank single:19197::"yes","no"} Recurrent headaches: {Blank single:19197::"yes","no"} Visual changes: {Blank  single:19197::"yes","no"} Palpitations: {Blank single:19197::"yes","no"} Dyspnea: {Blank single:19197::"yes","no"} Chest pain: {Blank single:19197::"yes","no"} Lower extremity edema: {Blank single:19197::"yes","no"} Dizzy/lightheaded: {Blank single:19197::"yes","no"}  MOOD   Relevant past medical, surgical, family and social history reviewed and updated as indicated. Interim medical history since our last visit reviewed. Allergies and medications reviewed and updated.  Review of Systems  Per HPI unless specifically indicated above     Objective:    There were no vitals taken for this visit.  Wt Readings from Last 3 Encounters:  04/13/22 208 lb 12.8 oz (94.7 kg)  04/12/22 200 lb (90.7 kg)  03/20/22 200 lb (90.7 kg)    Physical Exam  Results for orders placed or performed during the hospital encounter of [id number]  Basic metabolic panel  Result Value Ref Range   Sodium 138 135 - 145 mmol/L   Potassium 2.9 (L) 3.5 - 5.1 mmol/L   Chloride 106 98 - 111 mmol/L   CO2 24 22 - 32 mmol/L   Glucose, Bld 125 (H) 70 - 99 mg/dL   BUN 13 6 - 20 mg/dL   Creatinine, Ser 0.73 0.44 - 1.00 mg/dL   Calcium 9.0 8.9 - 10.3 mg/dL   GFR, Estimated >60 >60 mL/min   Anion gap 8 5 - 15  CBC  Result Value Ref Range   WBC 13.0 (H) 4.0 - 10.5 K/uL   RBC 5.65 (H) 3.87 - 5.11 MIL/uL   Hemoglobin 16.3 (H) 12.0 - 15.0 g/dL   HCT 47.2 (H) 36.0 - 46.0 %   MCV 83.5 80.0 - 100.0 fL   MCH 28.8 26.0 - 34.0 pg   MCHC 34.5 30.0 - 36.0 g/dL   RDW 14.6 11.5 - 15.5 %   Platelets 434 (H) 150 - 400 K/uL   nRBC 0.0 0.0 - 0.2 %  Urinalysis, Routine w reflex microscopic Urine, Clean Catch  Result Value Ref Range   Color, Urine YELLOW (A) YELLOW   APPearance HAZY (A) CLEAR   Specific Gravity, Urine 1.027 1.005 - 1.030   pH 6.0 5.0 - 8.0   Glucose, UA NEGATIVE NEGATIVE mg/dL   Hgb urine dipstick NEGATIVE NEGATIVE   Bilirubin Urine NEGATIVE NEGATIVE   Ketones, ur 5 (A) NEGATIVE mg/dL   Protein, ur 30 (A)  NEGATIVE mg/dL   Nitrite NEGATIVE NEGATIVE   Leukocytes,Ua NEGATIVE NEGATIVE   RBC / HPF 6-10 0 - 5 RBC/hpf   WBC, UA 0-5 0 - 5 WBC/hpf   Bacteria, UA NONE SEEN NONE SEEN   Squamous Epithelial / HPF 0-5 0 - 5   Mucus PRESENT   POC urine preg, ED  Result Value Ref  Range   Preg Test, Ur NEGATIVE NEGATIVE  Troponin I (High Sensitivity)  Result Value Ref Range   Troponin I (High Sensitivity) 6 <18 ng/L  Troponin I (High Sensitivity)  Result Value Ref Range   Troponin I (High Sensitivity) 7 <18 ng/L      Assessment & Plan:   Problem List Items Addressed This Visit       Cardiovascular and Mediastinum   Hypertension   Carotid stenosis, symptomatic, with infarction (HCC) - Primary     Other   Vitamin D deficiency disease   Anxiety and depression   Hyperlipidemia   Prediabetes     Follow up plan: No follow-ups on file.

## 2022-11-04 ENCOUNTER — Emergency Department

## 2022-11-04 ENCOUNTER — Emergency Department
Admission: EM | Admit: 2022-11-04 | Discharge: 2022-11-04 | Disposition: A | Discharge status: Home / Self Care | Attending: Emergency Medicine | Admitting: Emergency Medicine

## 2022-11-04 DIAGNOSIS — R0789 Other chest pain: Secondary | ICD-10-CM | POA: Insufficient documentation

## 2022-11-04 DIAGNOSIS — Z1152 Encounter for screening for COVID-19: Secondary | ICD-10-CM | POA: Diagnosis not present

## 2022-11-04 DIAGNOSIS — R079 Chest pain, unspecified: Secondary | ICD-10-CM | POA: Diagnosis present

## 2022-11-04 DIAGNOSIS — F17290 Nicotine dependence, other tobacco product, uncomplicated: Secondary | ICD-10-CM | POA: Diagnosis not present

## 2022-11-04 LAB — CBC WITH DIFFERENTIAL/PLATELET
Abs Immature Granulocytes: 0.03 10*3/uL (ref 0.00–0.07)
Basophils Absolute: 0 10*3/uL (ref 0.0–0.1)
Basophils Relative: 0 %
Eosinophils Absolute: 0 10*3/uL (ref 0.0–0.5)
Eosinophils Relative: 0 %
HCT: 47.3 % — ABNORMAL HIGH (ref 36.0–46.0)
Hemoglobin: 15.8 g/dL — ABNORMAL HIGH (ref 12.0–15.0)
Immature Granulocytes: 0 %
Lymphocytes Relative: 12 %
Lymphs Abs: 1.2 10*3/uL (ref 0.7–4.0)
MCH: 28.8 pg (ref 26.0–34.0)
MCHC: 33.4 g/dL (ref 30.0–36.0)
MCV: 86.3 fL (ref 80.0–100.0)
Monocytes Absolute: 0.3 10*3/uL (ref 0.1–1.0)
Monocytes Relative: 3 %
Neutro Abs: 8.3 10*3/uL — ABNORMAL HIGH (ref 1.7–7.7)
Neutrophils Relative %: 85 %
Platelets: 330 10*3/uL (ref 150–400)
RBC: 5.48 MIL/uL — ABNORMAL HIGH (ref 3.87–5.11)
RDW: 13.6 % (ref 11.5–15.5)
Smear Review: NORMAL
WBC: 9.9 10*3/uL (ref 4.0–10.5)
nRBC: 0 % (ref 0.0–0.2)

## 2022-11-04 LAB — D-DIMER, QUANTITATIVE: D-Dimer, Quant: 1.7 ug/mL-FEU — ABNORMAL HIGH (ref 0.00–0.50)

## 2022-11-04 LAB — RESP PANEL BY RT-PCR (RSV, FLU A&B, COVID)  RVPGX2
Influenza A by PCR: NEGATIVE
Influenza B by PCR: NEGATIVE
Resp Syncytial Virus by PCR: NEGATIVE
SARS Coronavirus 2 by RT PCR: NEGATIVE

## 2022-11-04 LAB — TROPONIN I (HIGH SENSITIVITY)
Troponin I (High Sensitivity): 6 ng/L (ref <18)
Troponin I (High Sensitivity): 5 ng/L (ref <18)

## 2022-11-04 LAB — COMPREHENSIVE METABOLIC PANEL
ALT: 27 U/L (ref 0–44)
AST: 27 U/L (ref 15–41)
Albumin: 4.5 g/dL (ref 3.5–5.0)
Alkaline Phosphatase: 62 U/L (ref 38–126)
Anion gap: 13 (ref 5–15)
BUN: 13 mg/dL (ref 6–20)
CO2: 27 mmol/L (ref 22–32)
Calcium: 10 mg/dL (ref 8.9–10.3)
Chloride: 97 mmol/L — ABNORMAL LOW (ref 98–111)
Creatinine, Ser: 0.82 mg/dL (ref 0.44–1.00)
GFR, Estimated: >60 mL/min (ref >60)
Glucose, Bld: 116 mg/dL — ABNORMAL HIGH (ref 70–99)
Potassium: 3.1 mmol/L — ABNORMAL LOW (ref 3.5–5.1)
Sodium: 137 mmol/L (ref 135–145)
Total Bilirubin: 1 mg/dL (ref 0.3–1.2)
Total Protein: 8.1 g/dL (ref 6.5–8.1)

## 2022-11-04 LAB — LIPASE, BLOOD: Lipase: 27 U/L (ref 11–51)

## 2022-11-04 MED ORDER — IOHEXOL 350 MG/ML SOLN
75.0000 mL | Freq: Once | INTRAVENOUS | Status: AC | PRN
  Administered 2022-11-04: 75 mL via INTRAVENOUS

## 2022-11-04 NOTE — Discharge Instructions (Signed)

## 2022-11-04 NOTE — ED Provider Notes (Signed)
[facility] Provider Note    Event Date/Time   First MD Initiated Contact with Patient 11/04/22 0254     (approximate)   History   Chest Pain (Patient C/O chest pain and SOB that began around midnight. Denies any cardiac history at this time. )   HPI  Latasha Lawrence is a 52 y.o. female who presents for evaluation of chest pain.  She said that for the last day or so she has felt little bit short of breath and had a cough.  She got up to go to work overnight shift at [employer] and said that she was having sharp pains in her chest but she went ahead and went to work.  The pain gradually got worse over the course of the shift, as did the feeling that she was short of breath.  EMS was called and when they arrived they gave her a full dose 325 mg aspirin and 1 nitro sublingual which she said brought the pain down from a 7 to a 4.  She currently still feels the pain but it is better than before.  She still has an occasional cough.  No recent fever, nausea, vomiting, abdominal pain, nor dysuria.  She has not had any recent hospitalizations, immobilizations, nor surgeries.  No long trips.  No history of blood clots in the legs of the lungs.  The patient reports that she smokes tobacco, has been diagnosed with high blood pressure but does not take any blood pressure medicine and has not for a long time, rarely goes to the doctor, and her father had a heart attack.     Physical Exam   ED Triage Vitals  Enc Vitals Group     BP 11/04/22 0255 (!) 146/112     Pulse Rate 11/04/22 0257 74     Resp 11/04/22 0257 13     Temp 11/04/22 0254 98.3 F (36.8 C)     Temp Source 11/04/22 0254 Oral     SpO2 11/04/22 0257 98 %     Weight 11/04/22 0254 83 kg (183 lb)     Height --      Head Circumference --      Peak Flow --      Pain Score --      Pain Loc --      Pain Edu? --      Excl. in [place]? --      Most recent vital signs: Vitals:   11/04/22 0500 11/04/22 0505  BP:     Pulse: 74 92  Resp: (!) 21   Temp:    SpO2: 98% 97%     General: Awake, no distress.  Generally well-appearing. HEENT: Very poor dentition (chronic) CV:  Good peripheral perfusion.  Normal heart sounds.  Regular rate and rhythm. Resp:  Normal effort. Speaking easily and comfortably, no accessory muscle usage nor intercostal retractions.  Lungs are clear to auscultation bilaterally with no wheezes, rales, nor rhonchi. Abd:  No distention.  No tenderness to palpation of the abdomen. Other:  No focal neurological deficits.  Mood and affect are normal.   ED Results / Procedures / Treatments   Labs (all labs ordered are listed, but only abnormal results are displayed) Labs Reviewed  COMPREHENSIVE METABOLIC PANEL - Abnormal; Notable for the following components:      Result Value   Potassium 3.1 (*)    Chloride 97 (*)    Glucose, Bld 116 (*)    All other  components within normal limits  CBC WITH DIFFERENTIAL/PLATELET - Abnormal; Notable for the following components:   RBC 5.48 (*)    Hemoglobin 15.8 (*)    HCT 47.3 (*)    Neutro Abs 8.3 (*)    All other components within normal limits  D-DIMER, QUANTITATIVE - Abnormal; Notable for the following components:   D-Dimer, Quant 1.70 (*)    All other components within normal limits  RESP PANEL BY RT-PCR (RSV, FLU A&B, COVID)  [id number]  LIPASE, BLOOD  TROPONIN I (HIGH SENSITIVITY)  TROPONIN I (HIGH SENSITIVITY)     EKG  ED ECG REPORT I, Loleta Rose, the attending physician, personally viewed and interpreted this ECG.  Date: 11/04/2022 EKG Time: 2:58 AM Rate: 74 Rhythm: normal sinus rhythm QRS Axis: normal Intervals: normal ST/T Wave abnormalities: Non-specific ST segment / T-wave changes, but no clear evidence of acute ischemia. Narrative Interpretation: no definitive evidence of acute ischemia; does not meet STEMI criteria.  The computer is interpreting it as some ST segment elevation, probable normal early repull pattern,  but I think that there is some baseline wander particularly in lead to because of some artifact from patient movements (also in aVF).    RADIOLOGY I viewed and interpreted the patient's 1 view chest x-ray.  No acute abnormality is identified.  I also read the radiologist's report, which confirmed no acute findings.  I also viewed and interpreted the patient's CTA chest (see hospital course for details).  No PE.    PROCEDURES:  Critical Care performed: No  .1-3 Lead EKG Interpretation  Performed by: Loleta Rose, MD Authorized by: Loleta Rose, MD     Interpretation: normal     ECG rate:  75   ECG rate assessment: normal     Rhythm: sinus rhythm     Ectopy: none     Conduction: normal      MEDICATIONS ORDERED IN ED: Medications  iohexol (OMNIPAQUE) 350 MG/ML injection 75 mL (75 mLs Intravenous Contrast Given 11/04/22 0439)     IMPRESSION / MDM / ASSESSMENT AND PLAN / ED COURSE  I reviewed the triage vital signs and the nursing notes.                              Differential diagnosis includes, but is not limited to, respiratory viral infection, community-acquired pneumonia, ACS, PE, AAS, musculoskeletal pain, acid reflux, pancreatitis, biliary colic.  Patient's presentation is most consistent with acute presentation with potential threat to life or bodily function.  Labs/studies ordered: CMP, CBC with differential, high-sensitivity troponin x 2, lipase, D-dimer, respiratory viral panel, one-view portable chest x-ray Interventions/Medications given:   (Patient received aspirin and 1 nitro prior to arrival by EMS.)  Patient's blood pressure is elevated but per her own report she has had a diagnosis of hypertension for a long time but does not take medications for it.  It is unlikely to reflect an emergent condition at this time.  She is having no focal neurological deficits.  I think that AAS is unlikely though PE and ACS are both possible.  She has no tenderness to  palpation of the abdomen I think the pancreatitis and biliary colic are less likely.  The patient is 52 years old and PERC rules do not apply.  She is low risk for PE but I think it is reasonable to further risk stratify her with a D-dimer, understanding that she may require CTA chest if the  dimer is elevated.  Patient is not currently in distress and I will hold off on additional treatment at this time although she may benefit from additional nitro.  Initial EKG is nonspecific.  The computer interpreted as having some ST segment elevation but I think this is actually due to some movement artifact.  I will wait for period of time and then repeat the EKG, particularly if she develops any new symptoms.  The patient is on the cardiac monitor to evaluate for evidence of arrhythmia and/or significant heart rate changes.   Clinical Course as of 11/04/22 0612  Sat Nov 04, 2022  0330 I viewed and interpreted the patient's 1 view chest x-ray and I see no evidence of pneumonia, pneumothorax, nor other acute or emergent abnormality.  I also read the radiologist's report, which confirmed no acute findings. [CF]  0424 D-Dimer, Quant(!): 1.70 Substantially elevated d-dimer, troponin within normal limits.  Will proceed with CTA chest to rule out PE. [CF]  0431 Discussed results so far and plan of care with the patient, and she agrees with the plan.  She says she feels better, still mild pain, but improved from prior. [CF]  0539 CT Angio Chest PE W/Cm &/Or Wo Cm I viewed and interpreted the patient's CTA chest without contrast.  I did not identify any acute abnormalities within the lung parenchyma and could see no evidence of pulmonary embolism.  I also read the radiologist's report, which confirmed no acute findings. [CF]  [id number] I reassessed the patient and she is ambulating around the room.  She said that she was feeling hot and just needed to get up and walk around.  She is having no chest pain or shortness of  breath at this time. [CF]  0601 Troponin I (High Sensitivity): 5 Repeat high-sensitivity troponin is within normal limits.  The patient has been comfortable and asymptomatic.  Given her low risk for ACS and her reassuring CTA of the chest, I believe she is appropriate for discharge.  I considered admitting her for unstable angina, but given her low risk and reassuring workup thus far as well as the fact that she is asymptomatic, I think she would benefit from outpatient cardiology follow-up.  I discussed this plan of care with her and she agrees.  I put in the order for outpatient cardiology referral and I gave strict return precautions.  She agrees with the plan. [CF]    Clinical Course User Index [CF] Hinda Kehr, MD     FINAL CLINICAL IMPRESSION(S) / ED DIAGNOSES   Final diagnoses:  Atypical chest pain     Rx / DC Orders   ED Discharge Orders          Ordered    Ambulatory referral to Cardiology       Comments: If you have not heard from the Cardiology office within the next 72 hours please call [phone].   11/04/22 0277             Note:  This document was prepared using Dragon voice recognition software and may include unintentional dictation errors.   Hinda Kehr, MD 11/04/22 [id number]

## 2022-11-09 ENCOUNTER — Other Ambulatory Visit: Payer: Self-pay | Admitting: Nurse Practitioner

## 2022-11-09 DIAGNOSIS — R61 Generalized hyperhidrosis: Secondary | ICD-10-CM

## 2022-11-09 DIAGNOSIS — R634 Abnormal weight loss: Secondary | ICD-10-CM

## 2022-11-09 NOTE — Telephone Encounter (Signed)
Requested medication (s) are due for refill today - yes  Requested medication (s) are on the active medication list - yes  Future visit scheduled -no  Last refill: Ondansetron- 04/13/22 #20 non delegated Rx                 Venlafaxine- 10/11/22 #15                  Hydrochlorothiazide- 10/11/22 #15   Notes to clinic: Attempted to call patient- no answer- courtesy RF already given  Requested Prescriptions  Pending Prescriptions Disp Refills   venlafaxine XR (EFFEXOR-XR) 150 MG 24 hr capsule [Pharmacy Med Name: VENLAFAXINE HCL ER 150 MG CAP] 15 capsule 0    Sig: Take 1 capsule (150 mg total) by mouth daily with breakfast.     Psychiatry: Antidepressants - SNRI - desvenlafaxine & venlafaxine Failed - 11/09/2022  2:06 PM      Failed - Valid encounter within last 6 months    Recent Outpatient Visits           7 months ago Dental infection   [place] Jon Billings, NP   8 months ago Dental infection   [place], Dani Gobble, PA-C   1 year ago Anxiety and depression   [place] Jon Billings, NP   1 year ago Anxiety and depression   [place] Jon Billings, NP   1 year ago Breast abscess   [place] Vigg, Avanti, MD              Failed - Lipid Panel in normal range within the last 12 months    Cholesterol, Total  Date Value Ref Range Status  01/31/2021 121 100 - 199 mg/dL Final   Cholesterol  Date Value Ref Range Status  01/19/2014 268 (H) 0 - 200 mg/dL Final   Ldl Cholesterol, Calc  Date Value Ref Range Status  01/19/2014 167 (H) 0 - 100 mg/dL Final   LDL Chol Calc (NIH)  Date Value Ref Range Status  01/31/2021 59 0 - 99 mg/dL Final   HDL Cholesterol  Date Value Ref Range Status  01/19/2014 33 (L) 40 - 60 mg/dL Final   HDL  Date Value Ref Range Status  01/31/2021 43 >39 mg/dL Final   Triglycerides  Date Value Ref Range  Status  01/31/2021 104 0 - 149 mg/dL Final  01/19/2014 340 (H) 0 - 200 mg/dL Final         Passed - Cr in normal range and within 360 days    Creatinine  Date Value Ref Range Status  09/06/2014 0.97 0.60 - 1.30 mg/dL Final   Creatinine, Ser  Date Value Ref Range Status  11/04/2022 0.82 0.44 - 1.00 mg/dL Final         Passed - Completed PHQ-2 or PHQ-9 in the last 360 days      Passed - Last BP in normal range    BP Readings from Last 1 Encounters:  11/04/22 134/76          hydrochlorothiazide (HYDRODIURIL) 25 MG tablet [Pharmacy Med Name: HYDROCHLOROTHIAZIDE 25 MG TAB] 15 tablet 0    Sig: Take 1 tablet (25 mg total) by mouth daily.     Cardiovascular: Diuretics - Thiazide Failed - 11/09/2022  2:06 PM      Failed - K in normal range and within 180 days    Potassium  Date  Value Ref Range Status  11/04/2022 3.1 (L) 3.5 - 5.1 mmol/L Final  09/06/2014 3.8 3.5 - 5.1 mmol/L Final         Failed - Valid encounter within last 6 months    Recent Outpatient Visits           7 months ago Dental infection   [place] Jon Billings, NP   8 months ago Dental infection   [place] Mecum, Dani Gobble, PA-C   1 year ago Anxiety and depression   [place] Jon Billings, NP   1 year ago Anxiety and depression   [place], NP   1 year ago Breast abscess   [place] Vigg, Avanti, MD              Passed - Cr in normal range and within 180 days    Creatinine  Date Value Ref Range Status  09/06/2014 0.97 0.60 - 1.30 mg/dL Final   Creatinine, Ser  Date Value Ref Range Status  11/04/2022 0.82 0.44 - 1.00 mg/dL Final         Passed - Na in normal range and within 180 days    Sodium  Date Value Ref Range Status  11/04/2022 137 135 - 145 mmol/L Final  09/22/2021 138 134 - 144 mmol/L Final  09/06/2014 138 136 - 145 mmol/L  Final         Passed - Last BP in normal range    BP Readings from Last 1 Encounters:  11/04/22 134/76          ondansetron (ZOFRAN-ODT) 4 MG disintegrating tablet [Pharmacy Med Name: ONDANSETRON ODT 4 MG TABLET] 20 tablet 0    Sig: Take 1 tablet (4 mg total) by mouth every 8 (eight) hours as needed for nausea or vomiting.     Not Delegated - Gastroenterology: Antiemetics - ondansetron Failed - 11/09/2022  2:06 PM      Failed - This refill cannot be delegated      Failed - Valid encounter within last 6 months    Recent Outpatient Visits           7 months ago Dental infection   [place] Jon Billings, NP   8 months ago Dental infection   [place], Dani Gobble, PA-C   1 year ago Anxiety and depression   [place] Jon Billings, NP   1 year ago Anxiety and depression   [place] Jon Billings, NP   1 year ago Breast abscess   [place] Vigg, Avanti, MD              Passed - AST in normal range and within 360 days    AST  Date Value Ref Range Status  11/04/2022 27 15 - 41 U/L Final   SGOT(AST)  Date Value Ref Range Status  09/06/2014 < 5 (L) 15 - 37 Unit/L Final         Passed - ALT in normal range and within 360 days    ALT  Date Value Ref Range Status  11/04/2022 27 0 - 44 U/L Final   SGPT (ALT)  Date Value Ref Range Status  09/06/2014 21 U/L Final    Comment:    14-63 NOTE: New Reference Range 04/28/14  Requested Prescriptions  Pending Prescriptions Disp Refills   venlafaxine XR (EFFEXOR-XR) 150 MG 24 hr capsule [Pharmacy Med Name: VENLAFAXINE HCL ER 150 MG CAP] 15 capsule 0    Sig: Take 1 capsule (150 mg total) by mouth daily with breakfast.     Psychiatry: Antidepressants - SNRI - desvenlafaxine & venlafaxine Failed - 11/09/2022  2:06 PM      Failed - Valid encounter within last 6 months     Recent Outpatient Visits           7 months ago Dental infection   [place] Jon Billings, NP   8 months ago Dental infection   [place], Dani Gobble, PA-C   1 year ago Anxiety and depression   [place] Jon Billings, NP   1 year ago Anxiety and depression   [place] Jon Billings, NP   1 year ago Breast abscess   [place] Vigg, Avanti, MD              Failed - Lipid Panel in normal range within the last 12 months    Cholesterol, Total  Date Value Ref Range Status  01/31/2021 121 100 - 199 mg/dL Final   Cholesterol  Date Value Ref Range Status  01/19/2014 268 (H) 0 - 200 mg/dL Final   Ldl Cholesterol, Calc  Date Value Ref Range Status  01/19/2014 167 (H) 0 - 100 mg/dL Final   LDL Chol Calc (NIH)  Date Value Ref Range Status  01/31/2021 59 0 - 99 mg/dL Final   HDL Cholesterol  Date Value Ref Range Status  01/19/2014 33 (L) 40 - 60 mg/dL Final   HDL  Date Value Ref Range Status  01/31/2021 43 >39 mg/dL Final   Triglycerides  Date Value Ref Range Status  01/31/2021 104 0 - 149 mg/dL Final  01/19/2014 340 (H) 0 - 200 mg/dL Final         Passed - Cr in normal range and within 360 days    Creatinine  Date Value Ref Range Status  09/06/2014 0.97 0.60 - 1.30 mg/dL Final   Creatinine, Ser  Date Value Ref Range Status  11/04/2022 0.82 0.44 - 1.00 mg/dL Final         Passed - Completed PHQ-2 or PHQ-9 in the last 360 days      Passed - Last BP in normal range    BP Readings from Last 1 Encounters:  11/04/22 134/76          hydrochlorothiazide (HYDRODIURIL) 25 MG tablet [Pharmacy Med Name: HYDROCHLOROTHIAZIDE 25 MG TAB] 15 tablet 0    Sig: Take 1 tablet (25 mg total) by mouth daily.     Cardiovascular: Diuretics - Thiazide Failed - 11/09/2022  2:06 PM      Failed - K in normal range and within 180 days     Potassium  Date Value Ref Range Status  11/04/2022 3.1 (L) 3.5 - 5.1 mmol/L Final  09/06/2014 3.8 3.5 - 5.1 mmol/L Final         Failed - Valid encounter within last 6 months    Recent Outpatient Visits           7 months ago Dental infection   [place], NP   8 months ago Dental infection   [place], Dani Gobble, PA-C   1 year  ago Anxiety and depression   [place] [facility] Larae Grooms, NP   1 year ago Anxiety and depression   [place] [facility] Larae Grooms, NP   1 year ago Breast abscess   [place] [facility] Vigg, Avanti, MD              Passed - Cr in normal range and within 180 days    Creatinine  Date Value Ref Range Status  09/06/2014 0.97 0.60 - 1.30 mg/dL Final   Creatinine, Ser  Date Value Ref Range Status  11/04/2022 0.82 0.44 - 1.00 mg/dL Final         Passed - Na in normal range and within 180 days    Sodium  Date Value Ref Range Status  11/04/2022 137 135 - 145 mmol/L Final  09/22/2021 138 134 - 144 mmol/L Final  09/06/2014 138 136 - 145 mmol/L Final         Passed - Last BP in normal range    BP Readings from Last 1 Encounters:  11/04/22 134/76          ondansetron (ZOFRAN-ODT) 4 MG disintegrating tablet [Pharmacy Med Name: ONDANSETRON ODT 4 MG TABLET] 20 tablet 0    Sig: Take 1 tablet (4 mg total) by mouth every 8 (eight) hours as needed for nausea or vomiting.     Not Delegated - Gastroenterology: Antiemetics - ondansetron Failed - 11/09/2022  2:06 PM      Failed - This refill cannot be delegated      Failed - Valid encounter within last 6 months    Recent Outpatient Visits           7 months ago Dental infection   [place] [facility] Larae Grooms, NP   8 months ago Dental infection   [place] [address] [facility] Mecum, Oswaldo [place], PA-C   1 year ago Anxiety and depression    [place] [facility] Larae Grooms, NP   1 year ago Anxiety and depression   [place] [facility] Larae Grooms, NP   1 year ago Breast abscess   [place] [facility] Vigg, Avanti, MD              Passed - AST in normal range and within 360 days    AST  Date Value Ref Range Status  11/04/2022 27 15 - 41 U/L Final   SGOT(AST)  Date Value Ref Range Status  09/06/2014 < 5 (L) 15 - 37 Unit/L Final         Passed - ALT in normal range and within 360 days    ALT  Date Value Ref Range Status  11/04/2022 27 0 - 44 U/L Final   SGPT (ALT)  Date Value Ref Range Status  09/06/2014 21 U/L Final    Comment:    14-63 NOTE: New Reference Range 04/28/14

## 2022-11-09 NOTE — Telephone Encounter (Signed)
Requested medication (s) are due for refill today:yes  Requested medication (s) are on the active medication list: yes  Last refill:  10/11/22  Future visit scheduled: yes  Notes to clinic:  Unable to refill per protocol, courtesy refill already given, routing for provider approval.      Requested Prescriptions  Pending Prescriptions Disp Refills   venlafaxine XR (EFFEXOR-XR) 75 MG 24 hr capsule [Pharmacy Med Name: VENLAFAXINE HCL ER 75 MG CAP] 15 capsule 0    Sig: Take 1 capsule (75 mg total) by mouth daily with breakfast. To take with Effexor 150mg .     Psychiatry: Antidepressants - SNRI - desvenlafaxine & venlafaxine Failed - 11/09/2022  2:07 PM      Failed - Valid encounter within last 6 months    Recent Outpatient Visits           7 months ago Dental infection   [place], NP   8 months ago Dental infection   [place] Mecum, Dani Gobble, PA-C   1 year ago Anxiety and depression   [place] Jon Billings, NP   1 year ago Anxiety and depression   [place] Jon Billings, NP   1 year ago Breast abscess   [place] Vigg, Avanti, MD              Failed - Lipid Panel in normal range within the last 12 months    Cholesterol, Total  Date Value Ref Range Status  01/31/2021 121 100 - 199 mg/dL Final   Cholesterol  Date Value Ref Range Status  01/19/2014 268 (H) 0 - 200 mg/dL Final   Ldl Cholesterol, Calc  Date Value Ref Range Status  01/19/2014 167 (H) 0 - 100 mg/dL Final   LDL Chol Calc (NIH)  Date Value Ref Range Status  01/31/2021 59 0 - 99 mg/dL Final   HDL Cholesterol  Date Value Ref Range Status  01/19/2014 33 (L) 40 - 60 mg/dL Final   HDL  Date Value Ref Range Status  01/31/2021 43 >39 mg/dL Final   Triglycerides  Date Value Ref Range Status  01/31/2021 104 0 - 149 mg/dL Final  01/19/2014 340 (H) 0  - 200 mg/dL Final         Passed - Cr in normal range and within 360 days    Creatinine  Date Value Ref Range Status  09/06/2014 0.97 0.60 - 1.30 mg/dL Final   Creatinine, Ser  Date Value Ref Range Status  11/04/2022 0.82 0.44 - 1.00 mg/dL Final         Passed - Completed PHQ-2 or PHQ-9 in the last 360 days      Passed - Last BP in normal range    BP Readings from Last 1 Encounters:  11/04/22 134/76

## 2022-11-15 ENCOUNTER — Ambulatory Visit: Admitting: Nurse Practitioner

## 2022-11-20 ENCOUNTER — Other Ambulatory Visit: Payer: Self-pay | Admitting: Nurse Practitioner

## 2022-11-20 DIAGNOSIS — R634 Abnormal weight loss: Secondary | ICD-10-CM

## 2022-11-20 DIAGNOSIS — R61 Generalized hyperhidrosis: Secondary | ICD-10-CM

## 2022-11-21 ENCOUNTER — Ambulatory Visit: Payer: Self-pay

## 2022-11-21 NOTE — Telephone Encounter (Signed)
  Chief Complaint: recent death of partner- is out of Effexor XR x 1 week Symptoms: more difficulty doing activities of daily living, tearful and grieving Frequency: since out of med Pertinent Negatives: Patient denies suicidal ideation or homicidal ideation Disposition: [] ED /[] Urgent Care (no appt availability in office) / [] Appointment(In office/virtual)/ []  [place] [facility]/ [] Home Care/ [] Refused Recommended Disposition /[]  Mobile Bus/ []  Follow-up with PCP Additional Notes: pt is looking for therapist -also needs HCTZ refilled. Pt needs sooner appt - no openings. Can pt be worked in? Next week appt wait listed.  Reason for Disposition  Recent death of a loved one  Answer Assessment - Initial Assessment Questions 1. CONCERN: "What happened that made you call today?"     *No Answer* 2. DEPRESSION SYMPTOM SCREENING: "How are you feeling overall?" (e.g., decreased energy, increased sleeping or difficulty sleeping, difficulty concentrating, feelings of sadness, guilt, hopelessness, or worthlessness)     *No Answer* 3. RISK OF HARM - SUICIDAL IDEATION:  "Do you ever have thoughts of hurting or killing yourself?"  (e.g., yes, no, no but preoccupation with thoughts about death)   - INTENT:  "Do you have thoughts of hurting or killing yourself right NOW?" (e.g., yes, no, N/A)   - PLAN: "Do you have a specific plan for how you would do this?" (e.g., gun, knife, overdose, no plan, N/A)     no 4. RISK OF HARM - HOMICIDAL IDEATION:  "Do you ever have thoughts of hurting or killing someone else?"  (e.g., yes, no, no but preoccupation with thoughts about death)   - INTENT:  "Do you have thoughts of hurting or killing someone right NOW?" (e.g., yes, no, N/A)   - PLAN: "Do you have a specific plan for how you would do this?" (e.g., gun, knife, no plan, N/A)      *No Answer* 5. FUNCTIONAL IMPAIRMENT: "How have things been going for you overall? Have you had more difficulty than usual  doing your normal daily activities?"  (e.g., better, same, worse; self-care, school, work, interactions)     *No Answer* 6. SUPPORT: "Who is with you now?" "Who do you live with?" "Do you have family or friends who you can talk to?"      mother 45. THERAPIST: "Do you have a counselor or therapist? Name?"     no 8. STRESSORS: "Has there been any new stress or recent changes in your life?"     Recent stressors- partner died  41. ALCOHOL USE OR SUBSTANCE USE (DRUG USE): "Do you drink alcohol or use any illegal drugs?"     no 10. OTHER: "Do you have any other physical symptoms right now?" (e.g., fever)       Head zaps (withdrawal ) hurts all over 11. PREGNANCY: "Is there any chance you are pregnant?" "When was your last menstrual period?"       *No Answer*  Protocols used: Depression-A-AH

## 2022-11-21 NOTE — Telephone Encounter (Signed)
Patient can go on the wait list to see if something comes available.

## 2022-11-21 NOTE — Telephone Encounter (Signed)
Pt added to wait list.

## 2022-11-21 NOTE — Telephone Encounter (Signed)
Pt scheduled an appt for 2.20.24/ please advise on refills

## 2022-11-21 NOTE — Telephone Encounter (Signed)
Requested medication (s) are due for refill today: yes  Requested medication (s) are on the active medication list: yes  Last refill:  Effexor 75 mg and 150 mg = 10/11/22 #15             HCTZ: 10/11/22 #15  Future visit scheduled: yes  Notes to clinic:  pt called stated been out of med- missed 10/26/22 appt and rescheduled to 11/28/22. Pt reported withdrawal sx "head zaps" see triage note.    Requested Prescriptions  Pending Prescriptions Disp Refills   venlafaxine XR (EFFEXOR-XR) 150 MG 24 hr capsule [Pharmacy Med Name: VENLAFAXINE HCL ER 150 MG CAP] 15 capsule 0    Sig: Take 1 capsule (150 mg total) by mouth daily with breakfast.     Psychiatry: Antidepressants - SNRI - desvenlafaxine & venlafaxine Failed - 11/21/2022 11:21 AM      Failed - Valid encounter within last 6 months    Recent Outpatient Visits           7 months ago Dental infection   [place] Jon Billings, NP   9 months ago Dental infection   [place], Dani Gobble, PA-C   1 year ago Anxiety and depression   [place] Jon Billings, NP   1 year ago Anxiety and depression   [place] Jon Billings, NP   1 year ago Breast abscess   [place] Vigg, Avanti, MD       Future Appointments             In 1 week Jon Billings, NP [place]            Failed - Lipid Panel in normal range within the last 12 months    Cholesterol, Total  Date Value Ref Range Status  01/31/2021 121 100 - 199 mg/dL Final   Cholesterol  Date Value Ref Range Status  01/19/2014 268 (H) 0 - 200 mg/dL Final   Ldl Cholesterol, Calc  Date Value Ref Range Status  01/19/2014 167 (H) 0 - 100 mg/dL Final   LDL Chol Calc (NIH)  Date Value Ref Range Status  01/31/2021 59 0 - 99 mg/dL Final   HDL Cholesterol  Date Value Ref Range Status  01/19/2014 33 (L) 40  - 60 mg/dL Final   HDL  Date Value Ref Range Status  01/31/2021 43 >39 mg/dL Final   Triglycerides  Date Value Ref Range Status  01/31/2021 104 0 - 149 mg/dL Final  01/19/2014 340 (H) 0 - 200 mg/dL Final         Passed - Cr in normal range and within 360 days    Creatinine  Date Value Ref Range Status  09/06/2014 0.97 0.60 - 1.30 mg/dL Final   Creatinine, Ser  Date Value Ref Range Status  11/04/2022 0.82 0.44 - 1.00 mg/dL Final         Passed - Completed PHQ-2 or PHQ-9 in the last 360 days      Passed - Last BP in normal range    BP Readings from Last 1 Encounters:  11/04/22 134/76          hydrochlorothiazide (HYDRODIURIL) 25 MG tablet [Pharmacy Med Name: HYDROCHLOROTHIAZIDE 25 MG TAB] 15 tablet 0    Sig: Take 1 tablet (25 mg total) by mouth daily.     Cardiovascular: Diuretics - Thiazide Failed - 11/21/2022 11:21 AM  Failed - K in normal range and within 180 days    Potassium  Date Value Ref Range Status  11/04/2022 3.1 (L) 3.5 - 5.1 mmol/L Final  09/06/2014 3.8 3.5 - 5.1 mmol/L Final         Failed - Valid encounter within last 6 months    Recent Outpatient Visits           7 months ago Dental infection   [place] Jon Billings, NP   9 months ago Dental infection   [place] Mecum, Dani Gobble, PA-C   1 year ago Anxiety and depression   [place] Jon Billings, NP   1 year ago Anxiety and depression   [place] Jon Billings, NP   1 year ago Breast abscess   [place] Vigg, Avanti, MD       Future Appointments             In 1 week Jon Billings, NP [place]            Passed - Cr in normal range and within 180 days    Creatinine  Date Value Ref Range Status  09/06/2014 0.97 0.60 - 1.30 mg/dL Final   Creatinine, Ser  Date Value Ref Range Status  11/04/2022  0.82 0.44 - 1.00 mg/dL Final         Passed - Na in normal range and within 180 days    Sodium  Date Value Ref Range Status  11/04/2022 137 135 - 145 mmol/L Final  09/22/2021 138 134 - 144 mmol/L Final  09/06/2014 138 136 - 145 mmol/L Final         Passed - Last BP in normal range    BP Readings from Last 1 Encounters:  11/04/22 134/76          venlafaxine XR (EFFEXOR-XR) 75 MG 24 hr capsule [Pharmacy Med Name: VENLAFAXINE HCL ER 75 MG CAP] 15 capsule 0    Sig: Take 1 capsule (75 mg total) by mouth daily with breakfast. To take with Effexor 18m.     Psychiatry: Antidepressants - SNRI - desvenlafaxine & venlafaxine Failed - 11/21/2022 11:21 AM      Failed - Valid encounter within last 6 months    Recent Outpatient Visits           7 months ago [place] infection   [place]   9 months ago Dental infection   [place]   1 year ago Anxiety and depression   [place]   1 year ago Anxiety and depression   [place]   1 year ago Breast abscess   [place], [place]       Future Appointments             In [place] [facility]            Failed - Lipid Panel in normal range within the last 12 months    Cholesterol, Total  Date Value Ref Range Status  01/31/2021 121 100 - 199 mg/dL Final   Cholesterol  Date Value Ref Range Status  01/19/2014 268 (H) 0 - 200 mg/dL Final   Ldl  Cholesterol, Calc  Date Value Ref Range Status  01/19/2014 167 (H) 0 - 100 mg/dL Final   LDL Chol Calc (NIH)  Date Value Ref Range Status  01/31/2021 59 0 - 99 mg/dL Final   HDL Cholesterol  Date Value Ref Range Status  01/19/2014 33 (L) 40 - 60 mg/dL Final   HDL  Date Value Ref Range Status  01/31/2021 43 >39  mg/dL Final   Triglycerides  Date Value Ref Range Status  01/31/2021 104 0 - 149 mg/dL Final  01/19/2014 340 (H) 0 - 200 mg/dL Final         Passed - Cr in normal range and within 360 days    Creatinine  Date Value Ref Range Status  09/06/2014 0.97 0.60 - 1.30 mg/dL Final   Creatinine, Ser  Date Value Ref Range Status  11/04/2022 0.82 0.44 - 1.00 mg/dL Final         Passed - Completed PHQ-2 or PHQ-9 in the last 360 days      Passed - Last BP in normal range    BP Readings from Last 1 Encounters:  11/04/22 134/76

## 2022-11-23 NOTE — Telephone Encounter (Signed)
Pt called frustrated that she doesn't have refills, seeking call back from the clinic

## 2022-11-28 ENCOUNTER — Ambulatory Visit: Admitting: Nurse Practitioner

## 2022-11-28 NOTE — Progress Notes (Deleted)
LMP 11/28/2021 (Approximate)    Subjective:    Patient ID: Latasha Lawrence, female    DOB: 1970/10/30, 52 y.o.   MRN: [id number]  HPI: Latasha Lawrence is a 52 y.o. female  No chief complaint on file.  HYPERTENSION / HYPERLIPIDEMIA Satisfied with current treatment? {Blank single:19197::"yes","no"} Duration of hypertension: {Blank single:19197::"chronic","months","years"} BP monitoring frequency: {Blank single:19197::"not checking","rarely","daily","weekly","monthly","a few times a day","a few times a week","a few times a month"} BP range:  BP medication side effects: {Blank single:19197::"yes","no"} Past BP meds: {Blank A999333 (bystolic)","carvedilol","chlorthalidone","clonidine","diltiazem","exforge HCT","HCTZ","irbesartan (avapro)","labetalol","lisinopril","lisinopril-HCTZ","losartan (cozaar)","methyldopa","nifedipine","olmesartan (benicar)","olmesartan-HCTZ","quinapril","ramipril","spironalactone","tekturna","valsartan","valsartan-HCTZ","verapamil"} Duration of hyperlipidemia: {Blank single:19197::"chronic","months","years"} Cholesterol medication side effects: {Blank single:19197::"yes","no"} Cholesterol supplements: {Blank multiple:19196::"none","fish oil","niacin","red yeast rice"} Past cholesterol medications: {Blank multiple:19196::"none","atorvastain (lipitor)","lovastatin (mevacor)","pravastatin (pravachol)","rosuvastatin (crestor)","simvastatin (zocor)","vytorin","fenofibrate (tricor)","gemfibrozil","ezetimide (zetia)","niaspan","lovaza"} Medication compliance: {Blank single:19197::"excellent compliance","good compliance","fair compliance","poor compliance"} Aspirin: {Blank single:19197::"yes","no"} Recent stressors: {Blank single:19197::"yes","no"} Recurrent headaches: {Blank single:19197::"yes","no"} Visual changes: {Blank single:19197::"yes","no"} Palpitations: {Blank  single:19197::"yes","no"} Dyspnea: {Blank single:19197::"yes","no"} Chest pain: {Blank single:19197::"yes","no"} Lower extremity edema: {Blank single:19197::"yes","no"} Dizzy/lightheaded: {Blank single:19197::"yes","no"}  MOOD   Relevant past medical, surgical, family and social history reviewed and updated as indicated. Interim medical history since our last visit reviewed. Allergies and medications reviewed and updated.  Review of Systems  Per HPI unless specifically indicated above     Objective:    LMP 11/28/2021 (Approximate)   Wt Readings from Last 3 Encounters:  11/04/22 183 lb (83 kg)  04/13/22 208 lb 12.8 oz (94.7 kg)  04/12/22 200 lb (90.7 kg)    Physical Exam  Results for orders placed or performed during the hospital encounter of 11/04/22  Resp panel by RT-PCR (RSV, Flu A&B, Covid) Anterior Nasal Swab   Specimen: Anterior Nasal Swab  Result Value Ref Range   SARS Coronavirus 2 by RT PCR NEGATIVE NEGATIVE   Influenza A by PCR NEGATIVE NEGATIVE   Influenza B by PCR NEGATIVE NEGATIVE   Resp Syncytial Virus by PCR NEGATIVE NEGATIVE  Comprehensive metabolic panel  Result Value Ref Range   Sodium 137 135 - 145 mmol/L   Potassium 3.1 (L) 3.5 - 5.1 mmol/L   Chloride 97 (L) 98 - 111 mmol/L   CO2 27 22 - 32 mmol/L   Glucose, Bld 116 (H) 70 - 99 mg/dL   BUN 13 6 - 20 mg/dL   Creatinine, Ser 0.82 0.44 - 1.00 mg/dL   Calcium 10.0 8.9 - 10.3 mg/dL   Total Protein 8.1 6.5 - 8.1 g/dL   Albumin 4.5 3.5 - 5.0 g/dL   AST 27 15 - 41 U/L   ALT 27 0 - 44 U/L   Alkaline Phosphatase 62 38 - 126 U/L   Total Bilirubin 1.0 0.3 - 1.2 mg/dL   GFR, Estimated >60 >60 mL/min   Anion gap 13 5 - 15  Lipase, blood  Result Value Ref Range   Lipase 27 11 - 51 U/L  CBC with Differential  Result Value Ref Range   WBC 9.9 4.0 - 10.5 K/uL   RBC 5.48 (H) 3.87 - 5.11 MIL/uL   Hemoglobin 15.8 (H) 12.0 - 15.0 g/dL   HCT 47.3 (H) 36.0 - 46.0 %   MCV 86.3 80.0 - 100.0 fL   MCH 28.8 26.0 -  34.0 pg   MCHC 33.4 30.0 - 36.0 g/dL   RDW 13.6 11.5 - 15.5 %   Platelets 330 150 - 400 K/uL   nRBC 0.0 0.0 - 0.2 %   Neutrophils Relative % 85 %   Neutro Abs 8.3 (H) 1.7 - 7.7 K/uL   Lymphocytes Relative 12 %  Lymphs Abs 1.2 0.7 - 4.0 K/uL   Monocytes Relative 3 %   Monocytes Absolute 0.3 0.1 - 1.0 K/uL   Eosinophils Relative 0 %   Eosinophils Absolute 0.0 0.0 - 0.5 K/uL   Basophils Relative 0 %   Basophils Absolute 0.0 0.0 - 0.1 K/uL   WBC Morphology MORPHOLOGY UNREMARKABLE    RBC Morphology MORPHOLOGY UNREMARKABLE    Smear Review Normal platelet morphology    Immature Granulocytes 0 %   Abs Immature Granulocytes 0.03 0.00 - 0.07 K/uL  D-dimer, quantitative  Result Value Ref Range   D-Dimer, Quant 1.70 (H) 0.00 - 0.50 ug/mL-FEU  Troponin I (High Sensitivity)  Result Value Ref Range   Troponin I (High Sensitivity) 6 <18 ng/L  Troponin I (High Sensitivity)  Result Value Ref Range   Troponin I (High Sensitivity) 5 <18 ng/L      Assessment & Plan:   Problem List Items Addressed This Visit       Cardiovascular and Mediastinum   Hypertension   Carotid stenosis, symptomatic, with 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

## 2022-12-02 NOTE — Patient Instructions (Incomplete)
Managing Depression, Adult Depression is a mental health condition that affects your thoughts, feelings, and actions. Being diagnosed with depression can bring you relief if you did not know why you have felt or behaved a certain way. It could also leave you feeling overwhelmed. Finding ways to manage your symptoms can help you feel more positive about your future. How to manage lifestyle changes Being depressed is difficult. Depression can increase the level of everyday stress. Stress can make depression symptoms worse. You may believe your symptoms cannot be managed or will never improve. However, there are many things you can try to help manage your symptoms. There is hope. Managing stress  Stress is your body's reaction to life changes and events, both good and bad. Stress can add to your feelings of depression. Learning to manage your stress can help lessen your feelings of depression. Try some of the following approaches to reducing your stress (stress reduction techniques): Listen to music that you enjoy and that inspires you. Try using a meditation app or take a meditation class. Develop a practice that helps you connect with your spiritual self. Walk in nature, pray, or go to a place of worship. Practice deep breathing. To do this, inhale slowly through your nose. Pause at the top of your inhale for a few seconds and then exhale slowly, letting yourself relax. Repeat this three or four times. Practice yoga to help relax and work your muscles. Choose a stress reduction technique that works for you. These techniques take time and practice to develop. Set aside 5-15 minutes a day to do them. Therapists can offer training in these techniques. Do these things to help manage stress: Keep a journal. Know your limits. Set healthy boundaries for yourself and others, such as saying "no" when you think something is too much. Pay attention to how you react to certain situations. You may not be able to  control everything, but you can change your reaction. Add humor to your life by watching funny movies or shows. Make time for activities that you enjoy and that relax you. Spend less time using electronics, especially at night before bed. The light from screens can make your brain think it is time to get up rather than go to bed.  Medicines Medicines, such as antidepressants, are often a part of treatment for depression. Talk with your pharmacist or health care provider about all the medicines, supplements, and herbal products that you take, their possible side effects, and what medicines and other products are safe to take together. Make sure to report any side effects you may have to your health care provider. Relationships Your health care provider may suggest family therapy, couples therapy, or individual therapy as part of your treatment. How to recognize changes Everyone responds differently to treatment for depression. As you recover from depression, you may start to: Have more interest in doing activities. Feel more hopeful. Have more energy. Eat a more regular amount of food. Have better mental focus. It is important to recognize if your depression is not getting better or is getting worse. The symptoms you had in the beginning may return, such as: Feeling tired. Eating too much or too little. Sleeping too much or too little. Feeling restless, agitated, or hopeless. Trouble focusing or making decisions. Having unexplained aches and pains. Feeling irritable, angry, or aggressive. If you or your family members notice these symptoms coming back, let your health care provider know right away. Follow these instructions at home: Activity Try to  get some form of exercise each day, such as walking. Try yoga, mindfulness, or other stress reduction techniques. Participate in group activities if you are able. Lifestyle Get enough sleep. Cut down on or stop using caffeine, tobacco,  alcohol, and any other harmful substances. Eat a healthy diet that includes plenty of vegetables, fruits, whole grains, low-fat dairy products, and lean protein. Limit foods that are high in solid fats, added sugar, or salt (sodium). General instructions Take over-the-counter and prescription medicines only as told by your health care provider. Keep all follow-up visits. It is important for your health care provider to check on your mood, behavior, and medicines. Your health care provider may need to make changes to your treatment. Where to find support Talking to others  Friends and family members can be sources of support and guidance. Talk to trusted friends or family members about your condition. Explain your symptoms and let them know that you are working with a health care provider to treat your depression. Tell friends and family how they can help. Finances Find mental health providers that fit with your financial situation. Talk with your health care provider if you are worried about access to food, housing, or medicine. Call your insurance company to learn about your co-pays and prescription plan. Where to find more information You can find support in your area from: Anxiety and Depression Association of America (ADAA): adaa.org Mental Health America: mentalhealthamerica.net [employer] on Mental Illness: nami.org Contact a health care provider if: You stop taking your antidepressant medicines, and you have any of these symptoms: Nausea. Headache. Light-headedness. Chills and body aches. Not being able to sleep (insomnia). You or your friends and family think your depression is getting worse. Get help right away if: You have thoughts of hurting yourself or others. Get help right away if you feel like you may hurt yourself or others, or have thoughts about taking your own life. Go to your nearest emergency room or: Call 911. Call the [facility] at  [phone] or [phone]. This is open 24 hours a day. Text the Crisis Text Line at 225-257-8419. This information is not intended to replace advice given to you by your health care provider. Make sure you discuss any questions you have with your health care provider. Document Revised: 01/31/2022 Document Reviewed: 01/31/2022 Elsevier Patient Education  [employer].

## 2022-12-04 ENCOUNTER — Ambulatory Visit: Admitting: Nurse Practitioner

## 2022-12-04 NOTE — Progress Notes (Signed)
LMP 11/28/2021 (Approximate)    Subjective:    Patient ID: Latasha Lawrence, female    DOB: 1971-05-27, 52 y.o.   MRN: [id number]  HPI: Latasha Lawrence is a 52 y.o. female  No chief complaint on file.  HYPERTENSION / HYPERLIPIDEMIA Satisfied with current treatment? {Blank single:19197::"yes","no"} Duration of hypertension: {Blank single:19197::"chronic","months","years"} BP monitoring frequency: {Blank single:19197::"not checking","rarely","daily","weekly","monthly","a few times a day","a few times a week","a few times a month"} BP range:  BP medication side effects: {Blank single:19197::"yes","no"} Past BP meds: {Blank A999333 (bystolic)","carvedilol","chlorthalidone","clonidine","diltiazem","exforge HCT","HCTZ","irbesartan (avapro)","labetalol","lisinopril","lisinopril-HCTZ","losartan (cozaar)","methyldopa","nifedipine","olmesartan (benicar)","olmesartan-HCTZ","quinapril","ramipril","spironalactone","tekturna","valsartan","valsartan-HCTZ","verapamil"} Duration of hyperlipidemia: {Blank single:19197::"chronic","months","years"} Cholesterol medication side effects: {Blank single:19197::"yes","no"} Cholesterol supplements: {Blank multiple:19196::"none","fish oil","niacin","red yeast rice"} Past cholesterol medications: {Blank multiple:19196::"none","atorvastain (lipitor)","lovastatin (mevacor)","pravastatin (pravachol)","rosuvastatin (crestor)","simvastatin (zocor)","vytorin","fenofibrate (tricor)","gemfibrozil","ezetimide (zetia)","niaspan","lovaza"} Medication compliance: {Blank single:19197::"excellent compliance","good compliance","fair compliance","poor compliance"} Aspirin: {Blank single:19197::"yes","no"} Recent stressors: {Blank single:19197::"yes","no"} Recurrent headaches: {Blank single:19197::"yes","no"} Visual changes: {Blank single:19197::"yes","no"} Palpitations: {Blank  single:19197::"yes","no"} Dyspnea: {Blank single:19197::"yes","no"} Chest pain: {Blank single:19197::"yes","no"} Lower extremity edema: {Blank single:19197::"yes","no"} Dizzy/lightheaded: {Blank single:19197::"yes","no"}  MOOD   Relevant past medical, surgical, family and social history reviewed and updated as indicated. Interim medical history since our last visit reviewed. Allergies and medications reviewed and updated.  Review of Systems  Per HPI unless specifically indicated above     Objective:    LMP 11/28/2021 (Approximate)   Wt Readings from Last 3 Encounters:  11/04/22 183 lb (83 kg)  04/13/22 208 lb 12.8 oz (94.7 kg)  04/12/22 200 lb (90.7 kg)    Physical Exam  Results for orders placed or performed during the hospital encounter of 11/04/22  Resp panel by RT-PCR (RSV, Flu A&B, Covid) Anterior Nasal Swab   Specimen: Anterior Nasal Swab  Result Value Ref Range   SARS Coronavirus 2 by RT PCR NEGATIVE NEGATIVE   Influenza A by PCR NEGATIVE NEGATIVE   Influenza B by PCR NEGATIVE NEGATIVE   Resp Syncytial Virus by PCR NEGATIVE NEGATIVE  Comprehensive metabolic panel  Result Value Ref Range   Sodium 137 135 - 145 mmol/L   Potassium 3.1 (L) 3.5 - 5.1 mmol/L   Chloride 97 (L) 98 - 111 mmol/L   CO2 27 22 - 32 mmol/L   Glucose, Bld 116 (H) 70 - 99 mg/dL   BUN 13 6 - 20 mg/dL   Creatinine, Ser 0.82 0.44 - 1.00 mg/dL   Calcium 10.0 8.9 - 10.3 mg/dL   Total Protein 8.1 6.5 - 8.1 g/dL   Albumin 4.5 3.5 - 5.0 g/dL   AST 27 15 - 41 U/L   ALT 27 0 - 44 U/L   Alkaline Phosphatase 62 38 - 126 U/L   Total Bilirubin 1.0 0.3 - 1.2 mg/dL   GFR, Estimated >60 >60 mL/min   Anion gap 13 5 - 15  Lipase, blood  Result Value Ref Range   Lipase 27 11 - 51 U/L  CBC with Differential  Result Value Ref Range   WBC 9.9 4.0 - 10.5 K/uL   RBC 5.48 (H) 3.87 - 5.11 MIL/uL   Hemoglobin 15.8 (H) 12.0 - 15.0 g/dL   HCT 47.3 (H) 36.0 - 46.0 %   MCV 86.3 80.0 - 100.0 fL   MCH 28.8 26.0 -  34.0 pg   MCHC 33.4 30.0 - 36.0 g/dL   RDW 13.6 11.5 - 15.5 %   Platelets 330 150 - 400 K/uL   nRBC 0.0 0.0 - 0.2 %   Neutrophils Relative % 85 %   Neutro Abs 8.3 (H) 1.7 - 7.7 K/uL   Lymphocytes Relative 12 %  Lymphs Abs 1.2 0.7 - 4.0 K/uL   Monocytes Relative 3 %   Monocytes Absolute 0.3 0.1 - 1.0 K/uL   Eosinophils Relative 0 %   Eosinophils Absolute 0.0 0.0 - 0.5 K/uL   Basophils Relative 0 %   Basophils Absolute 0.0 0.0 - 0.1 K/uL   WBC Morphology MORPHOLOGY UNREMARKABLE    RBC Morphology MORPHOLOGY UNREMARKABLE    Smear Review Normal platelet morphology    Immature Granulocytes 0 %   Abs Immature Granulocytes 0.03 0.00 - 0.07 K/uL  D-dimer, quantitative  Result Value Ref Range   D-Dimer, Quant 1.70 (H) 0.00 - 0.50 ug/mL-FEU  Troponin I (High Sensitivity)  Result Value Ref Range   Troponin I (High Sensitivity) 6 <18 ng/L  Troponin I (High Sensitivity)  Result Value Ref Range   Troponin I (High Sensitivity) 5 <18 ng/L      Assessment & Plan:   Problem List Items Addressed This Visit   None    Follow up plan: No follow-ups on file.

## 2022-12-05 ENCOUNTER — Encounter: Payer: Self-pay | Admitting: Nurse Practitioner

## 2022-12-05 ENCOUNTER — Ambulatory Visit (INDEPENDENT_AMBULATORY_CARE_PROVIDER_SITE_OTHER): Admitting: Nurse Practitioner

## 2022-12-05 VITALS — BP 155/84 | HR 82 | Temp 99.5°F | Wt 194.4 lb

## 2022-12-05 DIAGNOSIS — E785 Hyperlipidemia, unspecified: Secondary | ICD-10-CM

## 2022-12-05 DIAGNOSIS — R7301 Impaired fasting glucose: Secondary | ICD-10-CM | POA: Diagnosis not present

## 2022-12-05 DIAGNOSIS — I63239 Cerebral infarction due to unspecified occlusion or stenosis of unspecified carotid arteries: Secondary | ICD-10-CM | POA: Diagnosis not present

## 2022-12-05 DIAGNOSIS — F32A Depression, unspecified: Secondary | ICD-10-CM

## 2022-12-05 DIAGNOSIS — J0110 Acute frontal sinusitis, unspecified: Secondary | ICD-10-CM

## 2022-12-05 DIAGNOSIS — E559 Vitamin D deficiency, unspecified: Secondary | ICD-10-CM

## 2022-12-05 DIAGNOSIS — R634 Abnormal weight loss: Secondary | ICD-10-CM

## 2022-12-05 DIAGNOSIS — Z0000 Encounter for general adult medical examination without abnormal findings: Secondary | ICD-10-CM

## 2022-12-05 DIAGNOSIS — I10 Essential (primary) hypertension: Secondary | ICD-10-CM

## 2022-12-05 DIAGNOSIS — F16150 Hallucinogen abuse with hallucinogen-induced psychotic disorder with delusions: Secondary | ICD-10-CM

## 2022-12-05 DIAGNOSIS — F419 Anxiety disorder, unspecified: Secondary | ICD-10-CM | POA: Diagnosis not present

## 2022-12-05 DIAGNOSIS — R61 Generalized hyperhidrosis: Secondary | ICD-10-CM

## 2022-12-05 LAB — URINALYSIS, ROUTINE W REFLEX MICROSCOPIC
Bilirubin, UA: NEGATIVE
Glucose, UA: NEGATIVE
Ketones, UA: NEGATIVE
Leukocytes,UA: NEGATIVE
Nitrite, UA: NEGATIVE
Protein,UA: NEGATIVE
RBC, UA: NEGATIVE
Specific Gravity, UA: <1.005 — ABNORMAL LOW (ref 1.005–1.030)
Urobilinogen, Ur: 0.2 mg/dL (ref 0.2–1.0)
pH, UA: 6 (ref 5.0–7.5)

## 2022-12-05 MED ORDER — VENLAFAXINE HCL ER 75 MG PO CP24: 75.0000 mg | ORAL_CAPSULE | Freq: Every day | ORAL | 0 refills | Status: DC

## 2022-12-05 MED ORDER — ATORVASTATIN CALCIUM 80 MG PO TABS: 80.0000 mg | ORAL_TABLET | Freq: Every day | ORAL | 1 refills | Status: DC

## 2022-12-05 MED ORDER — TRAZODONE HCL 100 MG PO TABS: 200.0000 mg | ORAL_TABLET | Freq: Every evening | ORAL | 1 refills | Status: DC | PRN

## 2022-12-05 MED ORDER — SPIRONOLACTONE 25 MG PO TABS: 25.0000 mg | ORAL_TABLET | Freq: Every day | ORAL | 0 refills | Status: DC

## 2022-12-05 MED ORDER — HYDROCHLOROTHIAZIDE 25 MG PO TABS: 25.0000 mg | ORAL_TABLET | Freq: Every day | ORAL | 1 refills | Status: DC

## 2022-12-05 MED ORDER — LOSARTAN POTASSIUM 50 MG PO TABS: 50.0000 mg | ORAL_TABLET | Freq: Every day | ORAL | 1 refills | Status: DC

## 2022-12-05 MED ORDER — AMOXICILLIN 500 MG PO CAPS: 500.0000 mg | ORAL_CAPSULE | Freq: Two times a day (BID) | ORAL | 0 refills | Status: AC

## 2022-12-05 NOTE — Assessment & Plan Note (Signed)
Continue current regimen, work on improving lifestyle habits for better control. Needs updated imaging.  Will discuss at next visit.

## 2022-12-05 NOTE — Assessment & Plan Note (Signed)
Chronic. Not well controlled.  Will restart Losartan, HCTZ and spirolactone.  Labs ordered today. Will make recommendations based on lab results.  Follow up in 1 month.  Call sooner if concerns arise.

## 2022-12-05 NOTE — Assessment & Plan Note (Signed)
Labs ordered at visit today.  Will make recommendations based on lab results.   

## 2022-12-05 NOTE — Assessment & Plan Note (Signed)
Chronic.  Controlled.  Continue with current medication regimen of Atorvastatin.  Refills sent today.  Labs ordered today.  Return to clinic in 6 months for reevaluation.  Call sooner if concerns arise.   

## 2022-12-05 NOTE — Assessment & Plan Note (Signed)
Chronic. Not well controlled.  Will restart Venlafaxine at '75mg'$ .  Follow up in 1 month.  Can increase at that time if doing well.  Endorses wanting more help with her anxiety.  Referral placed for patient to see Psychiatry.

## 2022-12-06 LAB — LIPID PANEL
Chol/HDL Ratio: 2.3 ratio (ref 0.0–4.4)
Cholesterol, Total: 120 mg/dL (ref 100–199)
HDL: 52 mg/dL (ref >39)
LDL Chol Calc (NIH): 48 mg/dL (ref 0–99)
Triglycerides: 113 mg/dL (ref 0–149)
VLDL Cholesterol Cal: 20 mg/dL (ref 5–40)

## 2022-12-06 LAB — COMPREHENSIVE METABOLIC PANEL
ALT: 18 IU/L (ref 0–32)
AST: 17 IU/L (ref 0–40)
Albumin/Globulin Ratio: 1.7 (ref 1.2–2.2)
Albumin: 4.8 g/dL (ref 3.9–4.9)
Alkaline Phosphatase: 81 IU/L (ref 44–121)
BUN/Creatinine Ratio: 7 — ABNORMAL LOW (ref 9–23)
BUN: 8 mg/dL (ref 6–24)
Bilirubin Total: 0.4 mg/dL (ref 0.0–1.2)
CO2: 27 mmol/L (ref 20–29)
Calcium: 10 mg/dL (ref 8.7–10.2)
Chloride: 101 mmol/L (ref 96–106)
Creatinine, Ser: 1.08 mg/dL — ABNORMAL HIGH (ref 0.57–1.00)
Globulin, Total: 2.9 g/dL (ref 1.5–4.5)
Glucose: 129 mg/dL — ABNORMAL HIGH (ref 70–99)
Potassium: 4.1 mmol/L (ref 3.5–5.2)
Sodium: 142 mmol/L (ref 134–144)
Total Protein: 7.7 g/dL (ref 6.0–8.5)
eGFR: 63 mL/min/{1.73_m2} (ref >59)

## 2022-12-06 LAB — CBC WITH DIFFERENTIAL/PLATELET
Basophils Absolute: 0 10*3/uL (ref 0.0–0.2)
Basos: 0 %
EOS (ABSOLUTE): 0.1 10*3/uL (ref 0.0–0.4)
Eos: 1 %
Hematocrit: 46.4 % (ref 34.0–46.6)
Hemoglobin: 15.5 g/dL (ref 11.1–15.9)
Immature Grans (Abs): 0 10*3/uL (ref 0.0–0.1)
Immature Granulocytes: 0 %
Lymphocytes Absolute: 0.9 10*3/uL (ref 0.7–3.1)
Lymphs: 10 %
MCH: 29.1 pg (ref 26.6–33.0)
MCHC: 33.4 g/dL (ref 31.5–35.7)
MCV: 87 fL (ref 79–97)
Monocytes Absolute: 0.3 10*3/uL (ref 0.1–0.9)
Monocytes: 3 %
Neutrophils Absolute: 7.8 10*3/uL — ABNORMAL HIGH (ref 1.4–7.0)
Neutrophils: 86 %
Platelets: 346 10*3/uL (ref 150–450)
RBC: 5.32 x10E6/uL — ABNORMAL HIGH (ref 3.77–5.28)
RDW: 13.1 % (ref 11.7–15.4)
WBC: 9.2 10*3/uL (ref 3.4–10.8)

## 2022-12-06 LAB — HEMOGLOBIN A1C
Est. average glucose Bld gHb Est-mCnc: 117 mg/dL
Hgb A1c MFr Bld: 5.7 % — ABNORMAL HIGH (ref 4.8–5.6)

## 2022-12-06 LAB — VITAMIN D 25 HYDROXY (VIT D DEFICIENCY, FRACTURES): Vit D, 25-Hydroxy: 48.1 ng/mL (ref 30.0–100.0)

## 2022-12-06 LAB — TSH: TSH: 1.6 u[IU]/mL (ref 0.450–4.500)

## 2022-12-06 NOTE — Progress Notes (Signed)
Please let patient know that her lab work looks good.  Her A1c is elevated in the prediabetic range.  I recommend a low carb diet.  Otherwise, her thyroid, cholesterol and complete blood count look good. Continue with current medication regimen. Follow up as discussed.

## 2022-12-09 ENCOUNTER — Other Ambulatory Visit: Payer: Self-pay | Admitting: Nurse Practitioner

## 2022-12-09 DIAGNOSIS — K219 Gastro-esophageal reflux disease without esophagitis: Secondary | ICD-10-CM

## 2022-12-11 NOTE — Telephone Encounter (Signed)
Requested Prescriptions  Pending Prescriptions Disp Refills   pantoprazole (PROTONIX) 40 MG tablet [Pharmacy Med Name: PANTOPRAZOLE SOD DR 40 MG TAB] 90 tablet 0    Sig: Take 1 tablet (40 mg total) by mouth daily.     Gastroenterology: Proton Pump Inhibitors Passed - 12/09/2022 10:04 AM      Passed - Valid encounter within last 12 months    Recent Outpatient Visits           6 days ago Carotid stenosis, symptomatic, with infarction [facility])   [place] Jon Billings, NP   8 months ago Dental infection   [place] Jon Billings, NP   10 months ago Dental infection   [place] Mecum, Dani Gobble, PA-C   1 year ago Anxiety and depression   [place] Jon Billings, NP   1 year ago Anxiety and depression   [place] Jon Billings, NP       Future Appointments             In 4 weeks Jon Billings, NP [place], [facility]             venlafaxine XR (EFFEXOR-XR) 150 MG 24 hr capsule [Pharmacy Med Name: VENLAFAXINE HCL ER 150 MG CAP] 15 capsule 0    Sig: Take 1 capsule (150 mg total) by mouth daily with breakfast.     Psychiatry: Antidepressants - SNRI - desvenlafaxine & venlafaxine Failed - 12/09/2022 10:04 AM      Failed - Cr in normal range and within 360 days    Creatinine  Date Value Ref Range Status  09/06/2014 0.97 0.60 - 1.30 mg/dL Final   Creatinine, Ser  Date Value Ref Range Status  12/05/2022 1.08 (H) 0.57 - 1.00 mg/dL Final         Failed - Last BP in normal range    BP Readings from Last 1 Encounters:  12/05/22 (!) 155/84         Failed - Lipid Panel in normal range within the last 12 months    Cholesterol, Total  Date Value Ref Range Status  12/05/2022 120 100 - 199 mg/dL Final   Cholesterol  Date Value Ref Range Status  01/19/2014 268 (H) 0 - 200 mg/dL Final   Ldl Cholesterol, Calc   Date Value Ref Range Status  01/19/2014 167 (H) 0 - 100 mg/dL Final   LDL Chol Calc (NIH)  Date Value Ref Range Status  12/05/2022 48 0 - 99 mg/dL Final   HDL Cholesterol  Date Value Ref Range Status  01/19/2014 33 (L) 40 - 60 mg/dL Final   HDL  Date Value Ref Range Status  12/05/2022 52 >39 mg/dL Final   Triglycerides  Date Value Ref Range Status  12/05/2022 113 0 - 149 mg/dL Final  01/19/2014 340 (H) 0 - 200 mg/dL Final         Passed - Completed PHQ-2 or PHQ-9 in the last 360 days      Passed - Valid encounter within last 6 months    Recent Outpatient Visits           6 days ago Carotid stenosis, symptomatic, with infarction [facility])   [place], NP   8 months ago Dental infection   [place], NP   10 months ago Dental infection   [place], Dani Gobble, PA-C   1 year ago Anxiety and depression   [place] Jon Billings, NP   1 year ago Anxiety and depression   [place] Jon Billings, NP       Future Appointments             In 4 weeks Jon Billings, NP [place], [facility]

## 2022-12-11 NOTE — Telephone Encounter (Signed)
Requested medication (s) are due for refill today: review  Requested medication (s) are on the active medication list: yes  Last refill:  10/11/22 #15/0  Future visit scheduled: yes 01/08/23  Notes to clinic:  pt is requesting refill for '150mg'$  which is on med profile but unsure if pt taking '75mg'$  or '150mg'$  since 75 was just sent in on 12/05/22. Please advise     Requested Prescriptions  Pending Prescriptions Disp Refills   venlafaxine XR (EFFEXOR-XR) 150 MG 24 hr capsule [Pharmacy Med Name: VENLAFAXINE HCL ER 150 MG CAP] 15 capsule 0    Sig: Take 1 capsule (150 mg total) by mouth daily with breakfast.     Psychiatry: Antidepressants - SNRI - desvenlafaxine & venlafaxine Failed - 12/09/2022 10:04 AM      Failed - Cr in normal range and within 360 days    Creatinine  Date Value Ref Range Status  09/06/2014 0.97 0.60 - 1.30 mg/dL Final   Creatinine, Ser  Date Value Ref Range Status  12/05/2022 1.08 (H) 0.57 - 1.00 mg/dL Final         Failed - Last BP in normal range    BP Readings from Last 1 Encounters:  12/05/22 (!) 155/84         Failed - Lipid Panel in normal range within the last 12 months    Cholesterol, Total  Date Value Ref Range Status  12/05/2022 120 100 - 199 mg/dL Final   Cholesterol  Date Value Ref Range Status  01/19/2014 268 (H) 0 - 200 mg/dL Final   Ldl Cholesterol, Calc  Date Value Ref Range Status  01/19/2014 167 (H) 0 - 100 mg/dL Final   LDL Chol Calc (NIH)  Date Value Ref Range Status  12/05/2022 48 0 - 99 mg/dL Final   HDL Cholesterol  Date Value Ref Range Status  01/19/2014 33 (L) 40 - 60 mg/dL Final   HDL  Date Value Ref Range Status  12/05/2022 52 >39 mg/dL Final   Triglycerides  Date Value Ref Range Status  12/05/2022 113 0 - 149 mg/dL Final  01/19/2014 340 (H) 0 - 200 mg/dL Final         Passed - Completed PHQ-2 or PHQ-9 in the last 360 days      Passed - Valid encounter within last 6 months    Recent Outpatient Visits           6  days ago Carotid stenosis, symptomatic, with infarction [facility])   [place] Jon Billings, NP   8 months ago Dental infection   [place] Jon Billings, NP   10 months ago Dental infection   [place], Dani Gobble, PA-C   1 year ago Anxiety and depression   [place] Jon Billings, NP   1 year ago Anxiety and depression   [place], NP       Future Appointments             In 4 weeks Jon Billings, NP [place], [facility]            Signed Prescriptions Disp Refills   pantoprazole (PROTONIX) 40 MG tablet 90 tablet 0    Sig: Take 1 tablet (40 mg total) by mouth daily.     Gastroenterology: Proton Pump Inhibitors Passed - 12/09/2022 10:04 AM      Passed - Valid encounter within  last 12 months    Recent Outpatient Visits           6 days ago Carotid stenosis, symptomatic, with infarction [facility])   [place], NP   8 months ago Dental infection   [place] Jon Billings, NP   10 months ago Dental infection   [place] [facility] Mecum, Dani Gobble, PA-C   1 year ago Anxiety and depression   [place] Jon Billings, NP   1 year ago Anxiety and depression   [place] Jon Billings, NP       Future Appointments             In 4 weeks Jon Billings, NP [place], [facility]

## 2022-12-20 ENCOUNTER — Encounter: Payer: Self-pay | Admitting: Nurse Practitioner

## 2023-01-02 ENCOUNTER — Ambulatory Visit: Payer: Self-pay | Admitting: *Deleted

## 2023-01-02 NOTE — Telephone Encounter (Signed)
  Chief Complaint: This dose of Effexor-XR 75 mg is not touching her depression.   Asking if the dose can be increased.   She was off of it for 2 weeks and had to restart it at a lower dose and is being weaned up to the dose she was on.  Her original dose of 150 mg once a day and 75 mg once a day in divided doses worked very well for her. Symptoms: Depression Frequency: N/A Pertinent Negatives: Patient denies feeling any better since restarting the Effexor-XR. Disposition: [] ED /[] Urgent Care (no appt availability in office) / [] Appointment(In office/virtual)/ []  [facility]/ [] Home Care/ [] Refused Recommended Disposition /[] [place] [facility]/ [x]  Follow-up with PCP Additional Notes: Message sent to Jon Billings, NP.    Pt. Agreeable to someone calling her back.

## 2023-01-02 NOTE — Telephone Encounter (Signed)
Message from Shan Levans sent at 01/02/2023  3:32 PM EDT  Summary: Medication Question   Patient states that the venlafaxine XR (EFFEXOR-XR) 75 MG 24 hr capsule is not working and wants to know if she can go up on the dosage. Please advise.          Call History   Type Contact Phone/Fax User  01/02/2023 03:29 PM EDT Phone (Incoming) Jamine, Nofsinger (Self) [phone] (M) Mabe, Sherie Don   Reason for Disposition  [1] Caller has URGENT medicine question about med that PCP or specialist prescribed AND [2] triager unable to answer question    Wants to see if the Effexor dose can be increased.  Answer Assessment - Initial Assessment Questions 1. NAME of MEDICINE: "What medicine(s) are you calling about?"     Effexor-XR 75 mg 24 hr. capsule 2. QUESTION: "What is your question?" (e.g., double dose of medicine, side effect)     Can she increase the dose?    I was off of it for 2 weeks and then she restarted it at a lower dose and it's being weaned up.    This dose is not touching this depression.   The dose I was on was a 150 mg capsule once a day and then a 75 mg capsule once a day in a divided dose.   I was doing well on that dose before I ran out of it and was off for 2 weeks and now being weaned back up. 3. PRESCRIBER: "Who prescribed the medicine?" Reason: if prescribed by specialist, call should be referred to that group.     Jon Billings, NP 4. SYMPTOMS: "Do you have any symptoms?" If Yes, ask: "What symptoms are you having?"  "How bad are the symptoms (e.g., mild, moderate, severe)     I'm feeling depressed.   This dose is not touching this depression. 5. PREGNANCY:  "Is there any chance that you are pregnant?" "When was your last menstrual period?"     Not asked  Protocols used: Medication Question Call-A-AH

## 2023-01-03 NOTE — Telephone Encounter (Signed)
Okay to increase dose.  However, the nurse triage message isn't clear on how patient would like to take medication.  Does she want to increase to 150mg  daily or 75mg  BID?

## 2023-01-03 NOTE — Telephone Encounter (Signed)
Called and LVM asking for patient to please return my call.   OK for [facility] to speak to patient and find out about the medication per Karen's message.

## 2023-01-04 NOTE — Telephone Encounter (Signed)
Called and LVM asking for patient to please return my call.    OK for [facility] to speak to patient and find out about the medication per Karen's message.

## 2023-01-08 ENCOUNTER — Ambulatory Visit: Admitting: Nurse Practitioner

## 2023-01-09 ENCOUNTER — Ambulatory Visit (INDEPENDENT_AMBULATORY_CARE_PROVIDER_SITE_OTHER): Admitting: Nurse Practitioner

## 2023-01-09 ENCOUNTER — Other Ambulatory Visit: Payer: Self-pay | Admitting: Nurse Practitioner

## 2023-01-09 ENCOUNTER — Encounter: Payer: Self-pay | Admitting: Nurse Practitioner

## 2023-01-09 DIAGNOSIS — F32A Depression, unspecified: Secondary | ICD-10-CM

## 2023-01-09 DIAGNOSIS — F419 Anxiety disorder, unspecified: Secondary | ICD-10-CM

## 2023-01-09 MED ORDER — VENLAFAXINE HCL ER 150 MG PO CP24: 150.0000 mg | ORAL_CAPSULE | Freq: Every day | ORAL | 0 refills | Status: DC

## 2023-01-09 MED ORDER — CETIRIZINE HCL 10 MG PO TABS: 10.0000 mg | ORAL_TABLET | Freq: Every day | ORAL | 1 refills | Status: DC

## 2023-01-09 MED ORDER — VENLAFAXINE HCL ER 75 MG PO CP24: 75.0000 mg | ORAL_CAPSULE | Freq: Every day | ORAL | 0 refills | Status: DC

## 2023-01-09 NOTE — Progress Notes (Signed)
BP 137/77   Pulse 68   Temp 98.8 F (37.1 C) (Oral)   Wt 183 lb 11.2 oz (83.3 kg)   LMP 11/28/2021 (Approximate)   SpO2 97%   BMI 29.65 kg/m    Subjective:    Patient ID: Latasha Lawrence, female    DOB: [birth year], 52 y.o.   MRN: [id number]  HPI: Latasha Lawrence is a 52 y.o. female  Chief Complaint  Patient presents with   Depression   MOOD Patient states she has been having bad anxiety and depression.  She did not increase to the Effexor 150mg .  She did not answer when called about the psychiatry referral.  Feels like her symptoms are not well controlled.   [place] Office Visit from 01/09/2023 in [place]  PHQ-9 Total Score 21         01/09/2023    2:46 PM 12/05/2022   11:25 AM 02/13/2022    2:22 PM 09/22/2021   10:16 AM  GAD 7 : Generalized Anxiety Score  Nervous, Anxious, on Edge 3 3 2 3   Control/stop worrying 3 3 1 2   Worry too much - different things 3 3 1 3   Trouble relaxing 3 3 3 3   Restless 1 2 1 2   Easily annoyed or irritable 2 2 2 2   Afraid - awful might happen 2 1 1 2   Total GAD 7 Score 17 17 11 17   Anxiety Difficulty Extremely difficult Extremely difficult  Very difficult       Relevant past medical, surgical, family and social history reviewed and updated as indicated. Interim medical history since our last visit reviewed. Allergies and medications reviewed and updated.  Review of Systems  Psychiatric/Behavioral:  Positive for dysphoric mood. Negative for suicidal ideas. The patient is nervous/anxious.     Per HPI unless specifically indicated above     Objective:    BP 137/77   Pulse 68   Temp 98.8 F (37.1 C) (Oral)   Wt 183 lb 11.2 oz (83.3 kg)   LMP 11/28/2021 (Approximate)   SpO2 97%   BMI 29.65 kg/m   Wt Readings from Last 3 Encounters:  01/09/23 183 lb 11.2 oz (83.3 kg)  12/05/22 194 lb 6.4 oz (88.2 kg)  11/04/22 183 lb (83 kg)    Physical Exam Vitals and nursing note reviewed.  Constitutional:       General: She is not in acute distress.    Appearance: Normal appearance. She is normal weight. She is not ill-appearing, toxic-appearing or diaphoretic.  HENT:     Head: Normocephalic.     Right Ear: External ear normal.     Left Ear: External ear normal.     Nose: Nose normal.     Mouth/Throat:     Mouth: Mucous membranes are moist.     Pharynx: Oropharynx is clear.  Eyes:     General:        Right eye: No discharge.        Left eye: No discharge.     Extraocular Movements: Extraocular movements intact.     Conjunctiva/sclera: Conjunctivae normal.     Pupils: Pupils are equal, round, and reactive to light.  Cardiovascular:     Rate and Rhythm: Normal rate and regular rhythm.     Heart sounds: No murmur heard. Pulmonary:     Effort: Pulmonary effort is normal. No respiratory distress.     Breath sounds: Normal breath sounds. No wheezing or rales.  Musculoskeletal:     Cervical back: Normal range of motion and neck supple.  Skin:    General: Skin is warm and dry.     Capillary Refill: Capillary refill takes less than 2 seconds.  Neurological:     General: No focal deficit present.     Mental Status: She is alert and oriented to person, place, and time. Mental status is at baseline.  Psychiatric:        Mood and Affect: Mood normal.        Behavior: Behavior normal.        Thought Content: Thought content normal.        Judgment: Judgment normal.     Results for orders placed or performed in visit on 12/05/22  CBC with Differential/Platelet  Result Value Ref Range   WBC 9.2 3.4 - 10.8 x10E3/uL   RBC 5.32 (H) 3.77 - 5.28 x10E6/uL   Hemoglobin 15.5 11.1 - 15.9 g/dL   Hematocrit 46.4 34.0 - 46.6 %   MCV 87 79 - 97 fL   MCH 29.1 26.6 - 33.0 pg   MCHC 33.4 31.5 - 35.7 g/dL   RDW 13.1 11.7 - 15.4 %   Platelets 346 150 - 450 x10E3/uL   Neutrophils 86 Not Estab. %   Lymphs 10 Not Estab. %   Monocytes 3 Not Estab. %   Eos 1 Not Estab. %   Basos 0 Not Estab. %    Neutrophils Absolute 7.8 (H) 1.4 - 7.0 x10E3/uL   Lymphocytes Absolute 0.9 0.7 - 3.1 x10E3/uL   Monocytes Absolute 0.3 0.1 - 0.9 x10E3/uL   EOS (ABSOLUTE) 0.1 0.0 - 0.4 x10E3/uL   Basophils Absolute 0.0 0.0 - 0.2 x10E3/uL   Immature Granulocytes 0 Not Estab. %   Immature Grans (Abs) 0.0 0.0 - 0.1 x10E3/uL  Comprehensive metabolic panel  Result Value Ref Range   Glucose 129 (H) 70 - 99 mg/dL   BUN 8 6 - 24 mg/dL   Creatinine, Ser 1.08 (H) 0.57 - 1.00 mg/dL   eGFR 63 >59 mL/min/1.73   BUN/Creatinine Ratio 7 (L) 9 - 23   Sodium 142 134 - 144 mmol/L   Potassium 4.1 3.5 - 5.2 mmol/L   Chloride 101 96 - 106 mmol/L   CO2 27 20 - 29 mmol/L   Calcium 10.0 8.7 - 10.2 mg/dL   Total Protein 7.7 6.0 - 8.5 g/dL   Albumin 4.8 3.9 - 4.9 g/dL   Globulin, Total 2.9 1.5 - 4.5 g/dL   Albumin/Globulin Ratio 1.7 1.2 - 2.2   Bilirubin Total 0.4 0.0 - 1.2 mg/dL   Alkaline Phosphatase 81 44 - 121 IU/L   AST 17 0 - 40 IU/L   ALT 18 0 - 32 IU/L  Lipid panel  Result Value Ref Range   Cholesterol, Total 120 100 - 199 mg/dL   Triglycerides 113 0 - 149 mg/dL   HDL 52 >39 mg/dL   VLDL Cholesterol Cal 20 5 - 40 mg/dL   LDL Chol Calc (NIH) 48 0 - 99 mg/dL   Chol/HDL Ratio 2.3 0.0 - 4.4 ratio  TSH  Result Value Ref Range   TSH 1.600 0.450 - 4.500 uIU/mL  Urinalysis, Routine w reflex microscopic  Result Value Ref Range   Specific Gravity, UA <1.005 (L) 1.005 - 1.030   pH, UA 6.0 5.0 - 7.5   Color, UA Yellow Yellow   Appearance Ur Clear Clear   Leukocytes,UA Negative Negative   Protein,UA Negative Negative/Trace  Glucose, UA Negative Negative   Ketones, UA Negative Negative   RBC, UA Negative Negative   Bilirubin, UA Negative Negative   Urobilinogen, Ur 0.2 0.2 - 1.0 mg/dL   Nitrite, UA Negative Negative   Microscopic Examination Comment   HgB A1c  Result Value Ref Range   Hgb A1c MFr Bld 5.7 (H) 4.8 - 5.6 %   Est. average glucose Bld gHb Est-mCnc 117 mg/dL  Vitamin D (25 hydroxy)  Result Value  Ref Range   Vit D, 25-Hydroxy 48.1 30.0 - 100.0 ng/mL      Assessment & Plan:   Problem List Items Addressed This Visit       Other   Anxiety and depression    Chronic. Not well controlled.  Will increase Effexor to 150mg  daily.  After two weeks okay to take 75mg  for evening dose.  New referral placed for psychiatry due to last one being closed.  Encouraged patient to answer the phone when called for referral.  Follow up in 6 weeks.  Call sooner if concerns arise.       Relevant Medications   venlafaxine XR (EFFEXOR-XR) 150 MG 24 hr capsule   venlafaxine XR (EFFEXOR-XR) 75 MG 24 hr capsule   Other Relevant Orders   Ambulatory referral to Psychiatry     Follow up plan: Return in about 6 weeks (around 02/20/2023) for Depression/Anxiety FU.

## 2023-01-09 NOTE — Telephone Encounter (Signed)
Requested Prescriptions  Refused Prescriptions Disp Refills   venlafaxine XR (EFFEXOR-XR) 75 MG 24 hr capsule [Pharmacy Med Name: VENLAFAXINE HCL ER 75 MG CAP] 30 capsule 0    Sig: Take 1 capsule (75 mg total) by mouth daily with breakfast. To take with Effexor 150mg .     Psychiatry: Antidepressants - SNRI - desvenlafaxine & venlafaxine Failed - 01/09/2023  3:10 PM      Failed - Cr in normal range and within 360 days    Creatinine  Date Value Ref Range Status  09/06/2014 0.97 0.60 - 1.30 mg/dL Final   Creatinine, Ser  Date Value Ref Range Status  12/05/2022 1.08 (H) 0.57 - 1.00 mg/dL Final         Failed - Lipid Panel in normal range within the last 12 months    Cholesterol, Total  Date Value Ref Range Status  12/05/2022 120 100 - 199 mg/dL Final   Cholesterol  Date Value Ref Range Status  01/19/2014 268 (H) 0 - 200 mg/dL Final   Ldl Cholesterol, Calc  Date Value Ref Range Status  01/19/2014 167 (H) 0 - 100 mg/dL Final   LDL Chol Calc (NIH)  Date Value Ref Range Status  12/05/2022 48 0 - 99 mg/dL Final   HDL Cholesterol  Date Value Ref Range Status  01/19/2014 33 (L) 40 - 60 mg/dL Final   HDL  Date Value Ref Range Status  12/05/2022 52 >39 mg/dL Final   Triglycerides  Date Value Ref Range Status  12/05/2022 113 0 - 149 mg/dL Final  01/19/2014 340 (H) 0 - 200 mg/dL Final         Passed - Completed PHQ-2 or PHQ-9 in the last 360 days      Passed - Last BP in normal range    BP Readings from Last 1 Encounters:  01/09/23 137/77         Passed - Valid encounter within last 6 months    Recent Outpatient Visits           Today Anxiety and depression   [place], Karen, NP   1 month ago Carotid stenosis, symptomatic, with infarction [facility])   [place] Jon Billings, NP   9 months ago Dental infection   [place], Santiago Glad, NP   11 months ago Dental infection    [place] [employer], Dani Gobble, PA-C   1 year ago Anxiety and depression   [place] Jon Billings, NP       Future Appointments             In 1 month Jon Billings, NP [place], [facility]             venlafaxine XR (EFFEXOR-XR) 150 MG 24 hr capsule [Pharmacy Med Name: VENLAFAXINE HCL ER 150 MG CAP] 15 capsule 0    Sig: Take 1 capsule (150 mg total) by mouth daily with breakfast.     Psychiatry: Antidepressants - SNRI - desvenlafaxine & venlafaxine Failed - 01/09/2023  3:10 PM      Failed - Cr in normal range and within 360 days    Creatinine  Date Value Ref Range Status  09/06/2014 0.97 0.60 - 1.30 mg/dL Final   Creatinine, Ser  Date Value Ref Range Status  12/05/2022 1.08 (H) 0.57 - 1.00 mg/dL Final         Failed - Lipid  Panel in normal range within the last 12 months    Cholesterol, Total  Date Value Ref Range Status  12/05/2022 120 100 - 199 mg/dL Final   Cholesterol  Date Value Ref Range Status  01/19/2014 268 (H) 0 - 200 mg/dL Final   Ldl Cholesterol, Calc  Date Value Ref Range Status  01/19/2014 167 (H) 0 - 100 mg/dL Final   LDL Chol Calc (NIH)  Date Value Ref Range Status  12/05/2022 48 0 - 99 mg/dL Final   HDL Cholesterol  Date Value Ref Range Status  01/19/2014 33 (L) 40 - 60 mg/dL Final   HDL  Date Value Ref Range Status  12/05/2022 52 >39 mg/dL Final   Triglycerides  Date Value Ref Range Status  12/05/2022 113 0 - 149 mg/dL Final  01/19/2014 340 (H) 0 - 200 mg/dL Final         Passed - Completed PHQ-2 or PHQ-9 in the last 360 days      Passed - Last BP in normal range    BP Readings from Last 1 Encounters:  01/09/23 137/77         Passed - Valid encounter within last 6 months    Recent Outpatient Visits           Today Anxiety and depression   [place], NP   1 month ago Carotid stenosis, symptomatic, with  infarction [facility])   [place] Jon Billings, NP   9 months ago Dental infection   [place] Jon Billings, NP   11 months ago Dental infection   [place], Dani Gobble, PA-C   1 year ago Anxiety and depression   [place] Jon Billings, NP       Future Appointments             In 1 month Jon Billings, NP [place], [facility]

## 2023-01-09 NOTE — Assessment & Plan Note (Signed)
Chronic. Not well controlled.  Will increase Effexor to 150mg  daily.  After two weeks okay to take 75mg  for evening dose.  New referral placed for psychiatry due to last one being closed.  Encouraged patient to answer the phone when called for referral.  Follow up in 6 weeks.  Call sooner if concerns arise.

## 2023-02-20 ENCOUNTER — Ambulatory Visit: Admitting: Nurse Practitioner

## 2023-02-20 NOTE — Progress Notes (Deleted)
LMP 11/28/2021 (Approximate)    Subjective:    Patient ID: ASYRIA LARIMER, female    DOB: 09-Feb-1971, 52 y.o.   MRN: [id number]  HPI: Latasha Lawrence is a 52 y.o. female  No chief complaint on file.  MOOD Patient states she has been having bad anxiety and depression.  She did not increase to the Effexor 150mg .  She did not answer when called about the psychiatry referral.  Feels like her symptoms are not well controlled.   Flowsheet Row Office Visit from 01/09/2023 in [facility]  PHQ-9 Total Score 21         01/09/2023    2:46 PM 12/05/2022   11:25 AM 02/13/2022    2:22 PM 09/22/2021   10:16 AM  GAD 7 : Generalized Anxiety Score  Nervous, Anxious, on Edge 3 3 2 3   Control/stop worrying 3 3 1 2   Worry too much - different things 3 3 1 3   Trouble relaxing 3 3 3 3   Restless 1 2 1 2   Easily annoyed or irritable 2 2 2 2   Afraid - awful might happen 2 1 1 2   Total GAD 7 Score 17 17 11 17   Anxiety Difficulty Extremely difficult Extremely difficult  Very difficult       Relevant past medical, surgical, family and social history reviewed and updated as indicated. Interim medical history since our last visit reviewed. Allergies and medications reviewed and updated.  Review of Systems  Psychiatric/Behavioral:  Positive for dysphoric mood. Negative for suicidal ideas. The patient is nervous/anxious.     Per HPI unless specifically indicated above     Objective:    LMP 11/28/2021 (Approximate)   Wt Readings from Last 3 Encounters:  01/09/23 183 lb 11.2 oz (83.3 kg)  12/05/22 194 lb 6.4 oz (88.2 kg)  11/04/22 183 lb (83 kg)    Physical Exam Vitals and nursing note reviewed.  Constitutional:      General: She is not in acute distress.    Appearance: Normal appearance. She is normal weight. She is not ill-appearing, toxic-appearing or diaphoretic.  HENT:     Head: Normocephalic.     Right Ear: External ear normal.     Left Ear: External ear normal.      Nose: Nose normal.     Mouth/Throat:     Mouth: Mucous membranes are moist.     Pharynx: Oropharynx is clear.  Eyes:     General:        Right eye: No discharge.        Left eye: No discharge.     Extraocular Movements: Extraocular movements intact.     Conjunctiva/sclera: Conjunctivae normal.     Pupils: Pupils are equal, round, and reactive to light.  Cardiovascular:     Rate and Rhythm: Normal rate and regular rhythm.     Heart sounds: No murmur heard. Pulmonary:     Effort: Pulmonary effort is normal. No respiratory distress.     Breath sounds: Normal breath sounds. No wheezing or rales.  Musculoskeletal:     Cervical back: Normal range of motion and neck supple.  Skin:    General: Skin is warm and dry.     Capillary Refill: Capillary refill takes less than 2 seconds.  Neurological:     General: No focal deficit present.     Mental Status: She is alert and oriented to person, place, and time. Mental status is at baseline.  Psychiatric:  Mood and Affect: Mood normal.        Behavior: Behavior normal.        Thought Content: Thought content normal.        Judgment: Judgment normal.    Results for orders placed or performed in visit on 12/05/22  CBC with Differential/Platelet  Result Value Ref Range   WBC 9.2 3.4 - 10.8 x10E3/uL   RBC 5.32 (H) 3.77 - 5.28 x10E6/uL   Hemoglobin 15.5 11.1 - 15.9 g/dL   Hematocrit 91.4 78.2 - 46.6 %   MCV 87 79 - 97 fL   MCH 29.1 26.6 - 33.0 pg   MCHC 33.4 31.5 - 35.7 g/dL   RDW 95.6 21.3 - 08.6 %   Platelets 346 150 - 450 x10E3/uL   Neutrophils 86 Not Estab. %   Lymphs 10 Not Estab. %   Monocytes 3 Not Estab. %   Eos 1 Not Estab. %   Basos 0 Not Estab. %   Neutrophils Absolute 7.8 (H) 1.4 - 7.0 x10E3/uL   Lymphocytes Absolute 0.9 0.7 - 3.1 x10E3/uL   Monocytes Absolute 0.3 0.1 - 0.9 x10E3/uL   EOS (ABSOLUTE) 0.1 0.0 - 0.4 x10E3/uL   Basophils Absolute 0.0 0.0 - 0.2 x10E3/uL   Immature Granulocytes 0 Not Estab. %    Immature Grans (Abs) 0.0 0.0 - 0.1 x10E3/uL  Comprehensive metabolic panel  Result Value Ref Range   Glucose 129 (H) 70 - 99 mg/dL   BUN 8 6 - 24 mg/dL   Creatinine, Ser 5.78 (H) 0.57 - 1.00 mg/dL   eGFR 63 >46 NG/EXB/2.84   BUN/Creatinine Ratio 7 (L) 9 - 23   Sodium 142 134 - 144 mmol/L   Potassium 4.1 3.5 - 5.2 mmol/L   Chloride 101 96 - 106 mmol/L   CO2 27 20 - 29 mmol/L   Calcium 10.0 8.7 - 10.2 mg/dL   Total Protein 7.7 6.0 - 8.5 g/dL   Albumin 4.8 3.9 - 4.9 g/dL   Globulin, Total 2.9 1.5 - 4.5 g/dL   Albumin/Globulin Ratio 1.7 1.2 - 2.2   Bilirubin Total 0.4 0.0 - 1.2 mg/dL   Alkaline Phosphatase 81 44 - 121 IU/L   AST 17 0 - 40 IU/L   ALT 18 0 - 32 IU/L  Lipid panel  Result Value Ref Range   Cholesterol, Total 120 100 - 199 mg/dL   Triglycerides 132 0 - 149 mg/dL   HDL 52 >44 mg/dL   VLDL Cholesterol Cal 20 5 - 40 mg/dL   LDL Chol Calc (NIH) 48 0 - 99 mg/dL   Chol/HDL Ratio 2.3 0.0 - 4.4 ratio  TSH  Result Value Ref Range   TSH 1.600 0.450 - 4.500 uIU/mL  Urinalysis, Routine w reflex microscopic  Result Value Ref Range   Specific Gravity, UA <1.005 (L) 1.005 - 1.030   pH, UA 6.0 5.0 - 7.5   Color, UA Yellow Yellow   Appearance Ur Clear Clear   Leukocytes,UA Negative Negative   Protein,UA Negative Negative/Trace   Glucose, UA Negative Negative   Ketones, UA Negative Negative   RBC, UA Negative Negative   Bilirubin, UA Negative Negative   Urobilinogen, Ur 0.2 0.2 - 1.0 mg/dL   Nitrite, UA Negative Negative   Microscopic Examination Comment   HgB A1c  Result Value Ref Range   Hgb A1c MFr Bld 5.7 (H) 4.8 - 5.6 %   Est. average glucose Bld gHb Est-mCnc 117 mg/dL  Vitamin D (25 hydroxy)  Result  Value Ref Range   Vit D, 25-Hydroxy 48.1 30.0 - 100.0 ng/mL      Assessment & Plan:   Problem List Items Addressed This Visit   None    Follow up plan: No follow-ups on file.

## 2023-03-01 ENCOUNTER — Encounter: Payer: Self-pay | Admitting: Emergency Medicine

## 2023-03-01 ENCOUNTER — Other Ambulatory Visit: Payer: Self-pay

## 2023-03-01 ENCOUNTER — Emergency Department
Admission: EM | Admit: 2023-03-01 | Discharge: 2023-03-01 | Disposition: A | Discharge status: Home / Self Care | Attending: Emergency Medicine | Admitting: Emergency Medicine

## 2023-03-01 DIAGNOSIS — Z8673 Personal history of transient ischemic attack (TIA), and cerebral infarction without residual deficits: Secondary | ICD-10-CM | POA: Insufficient documentation

## 2023-03-01 DIAGNOSIS — I10 Essential (primary) hypertension: Secondary | ICD-10-CM | POA: Insufficient documentation

## 2023-03-01 DIAGNOSIS — R112 Nausea with vomiting, unspecified: Secondary | ICD-10-CM | POA: Diagnosis present

## 2023-03-01 DIAGNOSIS — D72829 Elevated white blood cell count, unspecified: Secondary | ICD-10-CM | POA: Insufficient documentation

## 2023-03-01 DIAGNOSIS — E876 Hypokalemia: Secondary | ICD-10-CM

## 2023-03-01 DIAGNOSIS — R197 Diarrhea, unspecified: Secondary | ICD-10-CM | POA: Insufficient documentation

## 2023-03-01 LAB — COMPREHENSIVE METABOLIC PANEL
ALT: 16 U/L (ref 0–44)
AST: 15 U/L (ref 15–41)
Albumin: 3.7 g/dL (ref 3.5–5.0)
Alkaline Phosphatase: 61 U/L (ref 38–126)
Anion gap: 7 (ref 5–15)
BUN: 7 mg/dL (ref 6–20)
CO2: 28 mmol/L (ref 22–32)
Calcium: 9.3 mg/dL (ref 8.9–10.3)
Chloride: 107 mmol/L (ref 98–111)
Creatinine, Ser: 0.68 mg/dL (ref 0.44–1.00)
GFR, Estimated: >60 mL/min (ref >60)
Glucose, Bld: 114 mg/dL — ABNORMAL HIGH (ref 70–99)
Potassium: 2.5 mmol/L — CL (ref 3.5–5.1)
Sodium: 142 mmol/L (ref 135–145)
Total Bilirubin: 0.5 mg/dL (ref 0.3–1.2)
Total Protein: 7 g/dL (ref 6.5–8.1)

## 2023-03-01 LAB — URINALYSIS, ROUTINE W REFLEX MICROSCOPIC
Bilirubin Urine: NEGATIVE
Glucose, UA: NEGATIVE mg/dL
Hgb urine dipstick: NEGATIVE
Ketones, ur: NEGATIVE mg/dL
Leukocytes,Ua: NEGATIVE
Nitrite: NEGATIVE
Protein, ur: NEGATIVE mg/dL
Specific Gravity, Urine: 1.014 (ref 1.005–1.030)
pH: 7 (ref 5.0–8.0)

## 2023-03-01 LAB — CBC
HCT: 43.5 % (ref 36.0–46.0)
Hemoglobin: 15 g/dL (ref 12.0–15.0)
MCH: 29.4 pg (ref 26.0–34.0)
MCHC: 34.5 g/dL (ref 30.0–36.0)
MCV: 85.1 fL (ref 80.0–100.0)
Platelets: 340 10*3/uL (ref 150–400)
RBC: 5.11 MIL/uL (ref 3.87–5.11)
RDW: 14.3 % (ref 11.5–15.5)
WBC: 13.6 10*3/uL — ABNORMAL HIGH (ref 4.0–10.5)
nRBC: 0 % (ref 0.0–0.2)

## 2023-03-01 LAB — MAGNESIUM: Magnesium: 2.1 mg/dL (ref 1.7–2.4)

## 2023-03-01 LAB — LIPASE, BLOOD: Lipase: 34 U/L (ref 11–51)

## 2023-03-01 MED ORDER — ONDANSETRON 4 MG PO TBDP: 4.0000 mg | ORAL_TABLET | Freq: Three times a day (TID) | ORAL | 0 refills | Status: DC | PRN

## 2023-03-01 MED ORDER — KETOROLAC TROMETHAMINE 30 MG/ML IJ SOLN
15.0000 mg | Freq: Once | INTRAMUSCULAR | Status: AC
  Administered 2023-03-01: 15 mg via INTRAVENOUS
  Filled 2023-03-01: qty 1

## 2023-03-01 MED ORDER — MAGNESIUM SULFATE 2 GM/50ML IV SOLN
2.0000 g | Freq: Once | INTRAVENOUS | Status: AC
  Administered 2023-03-01: 2 g via INTRAVENOUS
  Filled 2023-03-01: qty 50

## 2023-03-01 MED ORDER — POTASSIUM CHLORIDE CRYS ER 20 MEQ PO TBCR: 20.0000 meq | EXTENDED_RELEASE_TABLET | Freq: Every day | ORAL | 0 refills | Status: DC

## 2023-03-01 MED ORDER — POTASSIUM CHLORIDE 10 MEQ/100ML IV SOLN
10.0000 meq | INTRAVENOUS | Status: AC
  Administered 2023-03-01: 10 meq via INTRAVENOUS
  Filled 2023-03-01: qty 100

## 2023-03-01 MED ORDER — POTASSIUM CHLORIDE CRYS ER 20 MEQ PO TBCR
40.0000 meq | EXTENDED_RELEASE_TABLET | Freq: Once | ORAL | Status: AC
  Administered 2023-03-01: 40 meq via ORAL
  Filled 2023-03-01: qty 2

## 2023-03-01 MED ORDER — ONDANSETRON HCL 4 MG/2ML IJ SOLN
4.0000 mg | Freq: Once | INTRAMUSCULAR | Status: AC
  Administered 2023-03-01: 4 mg via INTRAVENOUS
  Filled 2023-03-01: qty 2

## 2023-03-01 MED ORDER — LACTATED RINGERS IV BOLUS
1000.0000 mL | Freq: Once | INTRAVENOUS | Status: AC
  Administered 2023-03-01: 1000 mL via INTRAVENOUS

## 2023-03-01 NOTE — ED Provider Notes (Signed)
[facility] Provider Note    Event Date/Time   First MD Initiated Contact with Patient 03/01/23 1119     (approximate)   History   Chief Complaint Emesis   HPI  Latasha Lawrence is a 52 y.o. female with past medical history of hypertension, fibromyalgia, stroke, and polysubstance abuse who presents to the ED complaining of nausea and vomiting.  Patient reports that she has been feeling nauseous with multiple episodes of vomiting and diarrhea over the past 2 days.  This has been associated with diffuse bodyaches, which she attributes to her fibromyalgia.  She denies any fevers and has not had any cough, chest pain, shortness of breath.  She does report some diffuse crampy abdominal pain as well as some dysuria, has not had any flank pain.     Physical Exam   Triage Vital Signs: ED Triage Vitals [03/01/23 1027]  Enc Vitals Group     BP (!) 170/91     Pulse Rate 78     Resp 18     Temp 98.5 F (36.9 C)     Temp Source Oral     SpO2 95 %     Weight      Height      Head Circumference      Peak Flow      Pain Score 8     Pain Loc      Pain Edu?      Excl. in GC?     Most recent vital signs: Vitals:   03/01/23 1027  BP: (!) 170/91  Pulse: 78  Resp: 18  Temp: 98.5 F (36.9 C)  SpO2: 95%    Constitutional: Alert and oriented. Eyes: Conjunctivae are normal. Head: Atraumatic. Nose: No congestion/rhinnorhea. Mouth/Throat: Mucous membranes are dry Cardiovascular: Normal rate, regular rhythm. Grossly normal heart sounds.  2+ radial pulses bilaterally. Respiratory: Normal respiratory effort.  No retractions. Lungs CTAB. Gastrointestinal: Soft and nontender. No distention. Musculoskeletal: No lower extremity tenderness nor edema.  Neurologic:  Normal speech and language. No gross focal neurologic deficits are appreciated.    ED Results / Procedures / Treatments   Labs (all labs ordered are listed, but only abnormal results are  displayed) Labs Reviewed  COMPREHENSIVE METABOLIC PANEL - Abnormal; Notable for the following components:      Result Value   Potassium 2.5 (*)    Glucose, Bld 114 (*)    All other components within normal limits  CBC - Abnormal; Notable for the following components:   WBC 13.6 (*)    All other components within normal limits  LIPASE, BLOOD  MAGNESIUM  URINALYSIS, ROUTINE W REFLEX MICROSCOPIC     EKG  ED ECG REPORT I, Chesley Noon, the attending physician, personally viewed and interpreted this ECG.   Date: 03/01/2023  EKG Time: 14:14  Rate: 66  Rhythm: normal sinus rhythm  Axis: Normal  Intervals:none  ST&T Change: None  PROCEDURES:  Critical Care performed: No  Procedures   MEDICATIONS ORDERED IN ED: Medications  potassium chloride 10 mEq in 100 mL IVPB (0 mEq Intravenous Stopped 03/01/23 1358)  magnesium sulfate IVPB 2 g 50 mL (2 g Intravenous New Bag/Given 03/01/23 1358)  lactated ringers bolus 1,000 mL (1,000 mLs Intravenous New Bag/Given 03/01/23 1254)  ondansetron (ZOFRAN) injection 4 mg (4 mg Intravenous Given 03/01/23 1238)  ketorolac (TORADOL) 30 MG/ML injection 15 mg (15 mg Intravenous Given 03/01/23 1239)  potassium chloride SA (KLOR-CON M) CR tablet 40 mEq (40  mEq Oral Given 03/01/23 1332)     IMPRESSION / MDM / ASSESSMENT AND PLAN / ED COURSE  I reviewed the triage vital signs and the nursing notes.                              52 y.o. female with past medical history of hypertension, fibromyalgia, stroke, and polysubstance abuse who presents to the ED complaining of 2 days of nausea, vomiting, diarrhea with diffuse bodyaches and crampy abdominal pain.  Patient's presentation is most consistent with acute presentation with potential threat to life or bodily function.  Differential diagnosis includes, but is not limited to, dehydration, electrolyte abnormality, AKI, anemia, gastroenteritis, UTI, appendicitis, diverticulitis.  Patient well-appearing  and in no acute distress, vital signs are unremarkable.  She appears slightly dehydrated but has a benign abdominal exam, symptoms seem most consistent with a gastroenteritis.  Labs with mild leukocytosis, no significant anemia noted.  She does have significant hypokalemia at 2.5, we will replete both potassium and magnesium.  No significant AKI noted and LFTs along with lipase are within normal limits.  Will hydrate with IV fluids, treat symptomatically with IV Zofran and Toradol.  Urinalysis pending at this time.  Patient feeling better following IV Zofran and Toradol, tolerating oral intake without difficulty.  She is eager to be discharged home, does not want to stay for additional IV potassium.  No EKG changes noted and outpatient management seems reasonable, will prescribe Zofran and additional potassium supplementation.  She was counseled to follow-up with her PCP for recheck of potassium in 1 week, otherwise return to the ED for new or worsening symptoms.  Patient agrees with plan.      FINAL CLINICAL IMPRESSION(S) / ED DIAGNOSES   Final diagnoses:  Nausea and vomiting, unspecified vomiting type  Hypokalemia     Rx / DC Orders   ED Discharge Orders          Ordered    potassium chloride SA (KLOR-CON M) 20 MEQ tablet  Daily        03/01/23 1424    ondansetron (ZOFRAN-ODT) 4 MG disintegrating tablet  Every 8 hours PRN        03/01/23 1424             Note:  This document was prepared using Dragon voice recognition software and may include unintentional dictation errors.   Chesley Noon, MD 03/01/23 [phone]

## 2023-03-01 NOTE — ED Notes (Signed)
First Nurse Note: Patient to ED via [facility] from home for generalized pain with N/VD. Pain for the past 4 days and N/V/D x2 days. VS WNL with EMS.

## 2023-04-04 ENCOUNTER — Other Ambulatory Visit: Payer: Self-pay | Admitting: Nurse Practitioner

## 2023-04-04 DIAGNOSIS — F419 Anxiety disorder, unspecified: Secondary | ICD-10-CM

## 2023-04-05 NOTE — Telephone Encounter (Signed)
Requested Prescriptions  Pending Prescriptions Disp Refills   venlafaxine XR (EFFEXOR-XR) 150 MG 24 hr capsule [Pharmacy Med Name: VENLAFAXINE HCL ER 150 MG CAP] 90 capsule 0    Sig: Take 1 capsule (150 mg total) by mouth daily with breakfast.     Psychiatry: Antidepressants - SNRI - desvenlafaxine & venlafaxine Failed - 04/04/2023 12:19 PM      Failed - Last BP in normal range    BP Readings from Last 1 Encounters:  03/01/23 (!) 154/88         Failed - Lipid Panel in normal range within the last 12 months    Cholesterol, Total  Date Value Ref Range Status  12/05/2022 120 100 - 199 mg/dL Final   Cholesterol  Date Value Ref Range Status  01/19/2014 268 (H) 0 - 200 mg/dL Final   Ldl Cholesterol, Calc  Date Value Ref Range Status  01/19/2014 167 (H) 0 - 100 mg/dL Final   LDL Chol Calc (NIH)  Date Value Ref Range Status  12/05/2022 48 0 - 99 mg/dL Final   HDL Cholesterol  Date Value Ref Range Status  01/19/2014 33 (L) 40 - 60 mg/dL Final   HDL  Date Value Ref Range Status  12/05/2022 52 >39 mg/dL Final   Triglycerides  Date Value Ref Range Status  12/05/2022 113 0 - 149 mg/dL Final  16/07/9603 540 (H) 0 - 200 mg/dL Final         Passed - Cr in normal range and within 360 days    Creatinine  Date Value Ref Range Status  09/06/2014 0.97 0.60 - 1.30 mg/dL Final   Creatinine, Ser  Date Value Ref Range Status  03/01/2023 0.68 0.44 - 1.00 mg/dL Final         Passed - Completed PHQ-2 or PHQ-9 in the last 360 days      Passed - Valid encounter within last 6 months    Recent Outpatient Visits           2 months ago Anxiety and depression   [place] [facility] Larae Grooms, NP   4 months ago Carotid stenosis, symptomatic, with infarction [facility])   [place] [facility] Larae Grooms, NP   11 months ago Dental infection   [place] [facility] Brady, Clydie Braun, NP   1 year ago Dental infection   [place]  [facility] Mecum, Oswaldo Conroy, PA-C   1 year ago Anxiety and depression   [place] [facility] Larae Grooms, NP               venlafaxine XR (EFFEXOR-XR) 75 MG 24 hr capsule [Pharmacy Med Name: VENLAFAXINE HCL ER 75 MG CAP] 90 capsule 0    Sig: Take 1 capsule (75 mg total) by mouth daily with breakfast. To take with Effexor 150mg .     Psychiatry: Antidepressants - SNRI - desvenlafaxine & venlafaxine Failed - 04/04/2023 12:19 PM      Failed - Last BP in normal range    BP Readings from Last 1 Encounters:  03/01/23 (!) 154/88         Failed - Lipid Panel in normal range within the last 12 months    Cholesterol, Total  Date Value Ref Range Status  12/05/2022 120 100 - 199 mg/dL Final   Cholesterol  Date Value Ref Range Status  01/19/2014 268 (H) 0 - 200 mg/dL Final   Ldl Cholesterol, Calc  Date Value Ref Range Status  01/19/2014 167 (H) 0 - 100 mg/dL Final   LDL Chol Calc (NIH)  Date Value Ref Range Status  12/05/2022 48 0 - 99 mg/dL Final   HDL Cholesterol  Date Value Ref Range Status  01/19/2014 33 (L) 40 - 60 mg/dL Final   HDL  Date Value Ref Range Status  12/05/2022 52 >39 mg/dL Final   Triglycerides  Date Value Ref Range Status  12/05/2022 113 0 - 149 mg/dL Final  16/07/9603 540 (H) 0 - 200 mg/dL Final         Passed - Cr in normal range and within 360 days    Creatinine  Date Value Ref Range Status  09/06/2014 0.97 0.60 - 1.30 mg/dL Final   Creatinine, Ser  Date Value Ref Range Status  03/01/2023 0.68 0.44 - 1.00 mg/dL Final         Passed - Completed PHQ-2 or PHQ-9 in the last 360 days      Passed - Valid encounter within last 6 months    Recent Outpatient Visits           2 months ago Anxiety and depression   [facility] Larae Grooms, NP   4 months ago Carotid stenosis, symptomatic, with infarction [facility])   [place] [facility] Larae Grooms, NP   11 months ago  Dental infection   [place] [facility] Larae Grooms, NP   1 year ago Dental infection   [place] [facility] Mecum, Oswaldo Conroy, PA-C   1 year ago Anxiety and depression   [place] [facility] Larae Grooms, NP

## 2023-04-10 ENCOUNTER — Other Ambulatory Visit: Payer: Self-pay | Admitting: Nurse Practitioner

## 2023-04-11 NOTE — Telephone Encounter (Signed)
Requested Prescriptions  Pending Prescriptions Disp Refills   spironolactone (ALDACTONE) 25 MG tablet [Pharmacy Med Name: SPIRONOLACTONE 25 MG TABLET] 90 tablet 0    Sig: Take 1 tablet (25 mg total) by mouth daily.     Cardiovascular: Diuretics - Aldosterone Antagonist Failed - 04/10/2023 12:03 PM      Failed - K in normal range and within 180 days    Potassium  Date Value Ref Range Status  03/01/2023 2.5 (LL) 3.5 - 5.1 mmol/L Final    Comment:    CRITICAL RESULT CALLED TO, READ BACK BY AND VERIFIED WITH LINDA MCLAMB 03/01/23 1120 KLW   09/06/2014 3.8 3.5 - 5.1 mmol/L Final         Failed - Last BP in normal range    BP Readings from Last 1 Encounters:  03/01/23 (!) 154/88         Passed - Cr in normal range and within 180 days    Creatinine  Date Value Ref Range Status  09/06/2014 0.97 0.60 - 1.30 mg/dL Final   Creatinine, Ser  Date Value Ref Range Status  03/01/2023 0.68 0.44 - 1.00 mg/dL Final         Passed - Na in normal range and within 180 days    Sodium  Date Value Ref Range Status  03/01/2023 142 135 - 145 mmol/L Final  12/05/2022 142 134 - 144 mmol/L Final  09/06/2014 138 136 - 145 mmol/L Final         Passed - eGFR is 30 or above and within 180 days    EGFR (African American)  Date Value Ref Range Status  09/06/2014 >60 >6mL/min Final  01/21/2014 >60  Final   GFR calc Af Amer  Date Value Ref Range Status  02/19/2020 77 >59 mL/min/1.73 Final    Comment:    **[facility] currently reports eGFR in compliance with the current**   recommendations of the [employer]. [facility] will   update reporting as new guidelines are published from the NKF-ASN   Task force.    EGFR (Non-African Amer.)  Date Value Ref Range Status  09/06/2014 >60 >51mL/min Final    Comment:    eGFR values <46mL/min/1.73 m2 may be an indication of chronic kidney disease (CKD). Calculated eGFR, using the MRDR Study equation, is useful in  patients with stable renal  function. The eGFR calculation will not be reliable in acutely ill patients when serum creatinine is changing rapidly. It is not useful in patients on dialysis. The eGFR calculation may not be applicable to patients at the low and high extremes of body sizes, pregnant women, and vegetarians.   01/21/2014 >60  Final    Comment:    eGFR values <14mL/min/1.73 m2 may be an indication of chronic kidney disease (CKD). Calculated eGFR is useful in patients with stable renal function. The eGFR calculation will not be reliable in acutely ill patients when serum creatinine is changing rapidly. It is not useful in  patients on dialysis. The eGFR calculation may not be applicable to patients at the low and high extremes of body sizes, pregnant women, and vegetarians. POTASSIUM - Slight hemolysis, interpret results with  - caution.    GFR, Estimated  Date Value Ref Range Status  03/01/2023 >60 >60 mL/min Final    Comment:    (NOTE) Calculated using the CKD-EPI Creatinine Equation (2021)    eGFR  Date Value Ref Range Status  12/05/2022 63 >59 mL/min/1.73 Final  Passed - Valid encounter within last 6 months    Recent Outpatient Visits           3 months ago Anxiety and depression   [place] [facility] Larae Grooms, NP   4 months ago Carotid stenosis, symptomatic, with infarction [facility])   [place] [facility] Larae Grooms, NP   12 months ago Dental infection   [place] [facility] Larae Grooms, NP   1 year ago Dental infection   [place] [facility] Mecum, Oswaldo Conroy, PA-C   1 year ago Anxiety and depression   [place] [facility] Larae Grooms, NP

## 2023-04-26 ENCOUNTER — Other Ambulatory Visit: Payer: Self-pay | Admitting: Nurse Practitioner

## 2023-04-26 NOTE — Telephone Encounter (Signed)
Pt called in about refill, refused, says pt dismissed and she wants to know why. Please call back at [phone]

## 2023-05-06 ENCOUNTER — Other Ambulatory Visit: Payer: Self-pay

## 2023-05-06 ENCOUNTER — Emergency Department
Admission: EM | Admit: 2023-05-06 | Discharge: 2023-05-06 | Discharge status: Against Medical Advice | Attending: Emergency Medicine | Admitting: Emergency Medicine

## 2023-05-06 DIAGNOSIS — Z760 Encounter for issue of repeat prescription: Secondary | ICD-10-CM | POA: Diagnosis not present

## 2023-05-06 DIAGNOSIS — Z5321 Procedure and treatment not carried out due to patient leaving prior to being seen by health care provider: Secondary | ICD-10-CM | POA: Diagnosis not present

## 2023-05-06 DIAGNOSIS — R197 Diarrhea, unspecified: Secondary | ICD-10-CM | POA: Diagnosis not present

## 2023-05-06 DIAGNOSIS — G4700 Insomnia, unspecified: Secondary | ICD-10-CM | POA: Diagnosis not present

## 2023-05-06 DIAGNOSIS — R112 Nausea with vomiting, unspecified: Secondary | ICD-10-CM | POA: Insufficient documentation

## 2023-05-06 LAB — LIPASE, BLOOD: Lipase: 31 U/L (ref 11–51)

## 2023-05-06 LAB — COMPREHENSIVE METABOLIC PANEL
ALT: 17 U/L (ref 0–44)
AST: 16 U/L (ref 15–41)
Albumin: 4.4 g/dL (ref 3.5–5.0)
Alkaline Phosphatase: 66 U/L (ref 38–126)
Anion gap: 11 (ref 5–15)
BUN: 11 mg/dL (ref 6–20)
CO2: 26 mmol/L (ref 22–32)
Calcium: 9.8 mg/dL (ref 8.9–10.3)
Chloride: 103 mmol/L (ref 98–111)
Creatinine, Ser: 0.69 mg/dL (ref 0.44–1.00)
GFR, Estimated: >60 mL/min (ref >60)
Glucose, Bld: 127 mg/dL — ABNORMAL HIGH (ref 70–99)
Potassium: 3.1 mmol/L — ABNORMAL LOW (ref 3.5–5.1)
Sodium: 140 mmol/L (ref 135–145)
Total Bilirubin: 1 mg/dL (ref 0.3–1.2)
Total Protein: 8 g/dL (ref 6.5–8.1)

## 2023-05-06 LAB — CBC
HCT: 45.3 % (ref 36.0–46.0)
Hemoglobin: 15.7 g/dL — ABNORMAL HIGH (ref 12.0–15.0)
MCH: 29.2 pg (ref 26.0–34.0)
MCHC: 34.7 g/dL (ref 30.0–36.0)
MCV: 84.2 fL (ref 80.0–100.0)
Platelets: 330 10*3/uL (ref 150–400)
RBC: 5.38 MIL/uL — ABNORMAL HIGH (ref 3.87–5.11)
RDW: 13.3 % (ref 11.5–15.5)
WBC: 15 10*3/uL — ABNORMAL HIGH (ref 4.0–10.5)
nRBC: 0 % (ref 0.0–0.2)

## 2023-05-06 NOTE — ED Triage Notes (Signed)
Pt to ED for medication refill and NVD since 3d ago. Pt states her PCP said she "makes too many appts" and she ran out of meds 1 week ago which include trazodone, pantoprazole, venlafaxine and a "water pill", name unknown. Pt endorses NVD since 3 days. Also insomnia.

## 2023-05-06 NOTE — ED Notes (Signed)
Patient stating she no longer wants to wait. Patient encouraged to stay but declines. NAD noted. Ambulatory with steady gait.

## 2023-05-07 ENCOUNTER — Encounter: Payer: Self-pay | Admitting: Emergency Medicine

## 2023-05-07 ENCOUNTER — Telehealth: Payer: Self-pay

## 2023-05-07 ENCOUNTER — Other Ambulatory Visit: Payer: Self-pay

## 2023-05-07 ENCOUNTER — Emergency Department
Admission: EM | Admit: 2023-05-07 | Discharge: 2023-05-07 | Disposition: A | Discharge status: Home / Self Care | Attending: Emergency Medicine | Admitting: Emergency Medicine

## 2023-05-07 DIAGNOSIS — F419 Anxiety disorder, unspecified: Secondary | ICD-10-CM

## 2023-05-07 DIAGNOSIS — Z760 Encounter for issue of repeat prescription: Secondary | ICD-10-CM | POA: Insufficient documentation

## 2023-05-07 DIAGNOSIS — I10 Essential (primary) hypertension: Secondary | ICD-10-CM | POA: Insufficient documentation

## 2023-05-07 MED ORDER — HYDROXYZINE PAMOATE 100 MG PO CAPS: 100.0000 mg | ORAL_CAPSULE | Freq: Three times a day (TID) | ORAL | 0 refills | Status: DC | PRN

## 2023-05-07 MED ORDER — TRAZODONE HCL 100 MG PO TABS: 200.0000 mg | ORAL_TABLET | Freq: Every evening | ORAL | 0 refills | Status: DC | PRN

## 2023-05-07 MED ORDER — HYDROCHLOROTHIAZIDE 25 MG PO TABS: 25.0000 mg | ORAL_TABLET | Freq: Every day | ORAL | 0 refills | Status: DC

## 2023-05-07 MED ORDER — VENLAFAXINE HCL ER 75 MG PO CP24
75.0000 mg | ORAL_CAPSULE | Freq: Every day | ORAL | 0 refills | Status: DC
Start: 2023-05-07 — End: 2023-06-14

## 2023-05-07 MED ORDER — LOSARTAN POTASSIUM 50 MG PO TABS: 50.0000 mg | ORAL_TABLET | Freq: Every day | ORAL | 0 refills | Status: DC

## 2023-05-07 NOTE — Telephone Encounter (Signed)
Patient has been dismissed from [facility].

## 2023-05-07 NOTE — Telephone Encounter (Signed)
-----   Message from [facility] R sent at 05/07/2023  8:56 AM EDT ----- Patient needs TOC call completed please.

## 2023-05-07 NOTE — ED Provider Notes (Signed)
[facility] Provider Note    Event Date/Time   First MD Initiated Contact with Patient 05/07/23 1739     (approximate)   History   Medication Refill   HPI  Latasha Lawrence is a 52 y.o. female  with history of hypertension, fibromyalgia, ischemic stroke, substance abuse in remission, and as listed in EMR presents to the emergency department for medication refill.  She has been out of her medications for approximately 1 week.  She has been unable to sleep due to not having her trazodone.  She denies SI or HI.  She denies active polysubstance abuse or illicit drug use.      Physical Exam   Triage Vital Signs: ED Triage Vitals  Encounter Vitals Group     BP 05/07/23 1729 (!) 155/82     Systolic BP Percentile --      Diastolic BP Percentile --      Pulse Rate 05/07/23 1729 79     Resp 05/07/23 1729 17     Temp 05/07/23 1729 98.3 F (36.8 C)     Temp Source 05/07/23 1729 Oral     SpO2 05/07/23 1729 98 %     Weight 05/07/23 1730 172 lb (78 kg)     Height 05/07/23 1730 5\' 6"  (1.676 m)     Head Circumference --      Peak Flow --      Pain Score 05/07/23 1731 6     Pain Loc --      Pain Education --      Exclude from Growth Chart --     Most recent vital signs: Vitals:   05/07/23 1729  BP: (!) 155/82  Pulse: 79  Resp: 17  Temp: 98.3 F (36.8 C)  SpO2: 98%    General: Awake, no distress.  CV:  Good peripheral perfusion.  Resp:  Normal effort.  Abd:  No distention.  Other:     ED Results / Procedures / Treatments   Labs (all labs ordered are listed, but only abnormal results are displayed) Labs Reviewed - No data to display   EKG     RADIOLOGY  Image and radiology report reviewed and interpreted by me. Radiology report consistent with the same.    PROCEDURES:  Critical Care performed: No  Procedures   MEDICATIONS ORDERED IN ED:  Medications - No data to display   IMPRESSION / MDM / ASSESSMENT AND PLAN / ED COURSE    I have reviewed the triage note.  Differential diagnosis includes, but is not limited to, insomnia, medication refill  Patient's presentation is most consistent with acute, uncomplicated illness.  52 year old female presenting to the emergency department requesting refill of her medications.  See HPI for further details.  Some of the medications were refilled for her today but she will need to reestablish care with primary doctor to go over all of her medicines and perform any lab studies that need to be obtained prior to restarting medicines.  Patient was advised that the emergency department does not often provide more than 1 refill of home medications.  Referral for primary care provider submitted at discharge.      FINAL CLINICAL IMPRESSION(S) / ED DIAGNOSES   Final diagnoses:  Medication refill     Rx / DC Orders   ED Discharge Orders          Ordered    hydrochlorothiazide (HYDRODIURIL) 25 MG tablet  Daily  Note to Pharmacy: Must keep appt on 10/26/22 for further refills.   05/07/23 1748    hydrOXYzine (VISTARIL) 100 MG capsule  3 times daily PRN        05/07/23 1748    losartan (COZAAR) 50 MG tablet  Daily        05/07/23 1748    venlafaxine XR (EFFEXOR-XR) 75 MG 24 hr capsule  Daily with breakfast        05/07/23 1748    traZODone (DESYREL) 100 MG tablet  At bedtime PRN       Note to Pharmacy: Must keep appt on 10/26/22 for further refills.   05/07/23 1749    Ambulatory Referral to Primary Care (Establish Care)        05/07/23 1749             Note:  This document was prepared using Dragon voice recognition software and may include unintentional dictation errors.   Chinita Pester, FNP 05/07/23 2054    Jene Every, MD 05/08/23 [phone]

## 2023-05-07 NOTE — Discharge Instructions (Signed)
Not all of your medications were refilled today.  You will need to see primary care for remainder of medications and for further refills.   The ER typically only refills medications once, but can not refill them monthly.

## 2023-05-07 NOTE — ED Triage Notes (Signed)
Pt here asking for a refill on her medications. Pt was also here recently for the same complaint. Pt denies pain or any symptoms.

## 2023-06-14 ENCOUNTER — Emergency Department

## 2023-06-14 ENCOUNTER — Encounter: Payer: Self-pay | Admitting: Emergency Medicine

## 2023-06-14 ENCOUNTER — Other Ambulatory Visit: Payer: Self-pay

## 2023-06-14 ENCOUNTER — Emergency Department
Admission: EM | Admit: 2023-06-14 | Discharge: 2023-06-14 | Disposition: A | Discharge status: Home / Self Care | Attending: Emergency Medicine | Admitting: Emergency Medicine

## 2023-06-14 DIAGNOSIS — M25512 Pain in left shoulder: Secondary | ICD-10-CM | POA: Insufficient documentation

## 2023-06-14 DIAGNOSIS — I10 Essential (primary) hypertension: Secondary | ICD-10-CM | POA: Diagnosis not present

## 2023-06-14 DIAGNOSIS — K219 Gastro-esophageal reflux disease without esophagitis: Secondary | ICD-10-CM

## 2023-06-14 MED ORDER — SPIRONOLACTONE 25 MG PO TABS: 25.0000 mg | ORAL_TABLET | Freq: Every day | ORAL | 0 refills | Status: DC

## 2023-06-14 MED ORDER — TRAZODONE HCL 100 MG PO TABS: 200.0000 mg | ORAL_TABLET | Freq: Every evening | ORAL | 0 refills | Status: DC | PRN

## 2023-06-14 MED ORDER — ATORVASTATIN CALCIUM 80 MG PO TABS: 80.0000 mg | ORAL_TABLET | Freq: Every day | ORAL | 1 refills | Status: DC

## 2023-06-14 MED ORDER — PANTOPRAZOLE SODIUM 40 MG PO TBEC
40.0000 mg | DELAYED_RELEASE_TABLET | Freq: Every day | ORAL | 0 refills | Status: DC
Start: 2023-06-14 — End: 2023-08-09

## 2023-06-14 MED ORDER — HYDROCHLOROTHIAZIDE 25 MG PO TABS: 25.0000 mg | ORAL_TABLET | Freq: Every day | ORAL | 0 refills | Status: DC

## 2023-06-14 MED ORDER — VENLAFAXINE HCL ER 150 MG PO CP24: 150.0000 mg | ORAL_CAPSULE | Freq: Every day | ORAL | 0 refills | Status: DC

## 2023-06-14 MED ORDER — LIDOCAINE 4 % EX PTCH: 1.0000 | MEDICATED_PATCH | CUTANEOUS | 0 refills | Status: DC

## 2023-06-14 NOTE — Discharge Instructions (Signed)
Your medications have been sent to [place] have been prescribed lidocaine patches for shoulder pain. Your x-ray may show some wear-and-tear on the shoulder but no acute bony abnormalities.

## 2023-06-14 NOTE — ED Provider Notes (Signed)
[facility] Provider Note  Patient Contact: 3:24 PM (approximate)   History   Shoulder Pain and Medication Refill   HPI  Latasha Lawrence is a 52 y.o. female with a history of depression, hypertension, anxiety and GERD, presents to the emergency department primarily for medication refills.  Patient states that she is in the process of reestablishing with primary care but states that she cannot get in to be seen until January.  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  No numbness or tingling in the upper and lower extremities.  No chest pain or abdominal pain.      Physical Exam   Triage Vital Signs: ED Triage Vitals [06/14/23 1338]  Encounter Vitals Group     BP (!) 156/81     Systolic BP Percentile      Diastolic BP Percentile      Pulse Rate 83     Resp 17     Temp 98 F (36.7 C)     Temp Source Oral     SpO2 100 %     Weight 172 lb (78 kg)     Height 5\' 6"  (1.676 m)     Head Circumference      Peak Flow      Pain Score 7     Pain Loc      Pain Education      Exclude from Growth Chart     Most recent vital signs: Vitals:   06/14/23 1338  BP: (!) 156/81  Pulse: 83  Resp: 17  Temp: 98 F (36.7 C)  SpO2: 100%     General: Alert and in no acute distress. Eyes:  PERRL. EOMI. Head: No acute traumatic findings ENT:      Nose: No congestion/rhinnorhea.      Mouth/Throat: Mucous membranes are moist. Neck: No stridor. No cervical spine tenderness to palpation. Cardiovascular:  Good peripheral perfusion Respiratory: Normal respiratory effort without tachypnea or retractions. Lungs CTAB. Good air entry to the bases with no decreased or absent breath sounds. Gastrointestinal: Bowel sounds 4 quadrants. Soft and nontender to palpation. No guarding or rigidity. No palpable masses. No distention. No CVA tenderness. Musculoskeletal: Full range of motion to all extremities.  Neurologic:  No  gross focal neurologic deficits are appreciated.  Skin:   No rash noted    ED Results / Procedures / Treatments   Labs (all labs ordered are listed, but only abnormal results are displayed) Labs Reviewed - No data to display      RADIOLOGY  {I personally viewed and evaluated these images as part of my medical decision making, as well as reviewing the written report by the radiologist.  ED Provider Interpretation: I personally reviewed x-ray of left shoulder which shows no acute bony abnormality.   PROCEDURES:  Critical Care performed: No  Procedures   MEDICATIONS ORDERED IN ED: Medications - No data to display   IMPRESSION / MDM / ASSESSMENT AND PLAN / ED COURSE  I reviewed the triage vital signs and the nursing notes.                              Assessment and plan Left shoulder pain Medication refill 52 year old female presents to the emergency department with left shoulder pain and the need for medication refills.  I personally reviewed x-ray of left shoulder which shows  no acute bony abnormality.  I recommended lidocaine patches as patient has a history of gastric ulcers and cannot tolerate NSAIDs.  Patient was given medication refills of her venlafaxine, pantoprazole, Lipitor, trazodone, hydrochlorothiazide and spironolactone.  Advised patient to keep her appointment with primary care provider for the 2025-year.  All patient questions were answered.      FINAL CLINICAL IMPRESSION(S) / ED DIAGNOSES   Final diagnoses:  Acute pain of left shoulder     Rx / DC Orders   ED Discharge Orders          Ordered    lidocaine 4 %  Every 24 hours        06/14/23 1517    pantoprazole (PROTONIX) 40 MG tablet  Daily        06/14/23 1517    traZODone (DESYREL) 100 MG tablet  At bedtime PRN       Note to Pharmacy: Must keep appt on 10/26/22 for further refills.   06/14/23 1517    venlafaxine XR (EFFEXOR-XR) 150 MG 24 hr capsule  Daily with breakfast         06/14/23 1517    spironolactone (ALDACTONE) 25 MG tablet  Daily        06/14/23 1517    hydrochlorothiazide (HYDRODIURIL) 25 MG tablet  Daily       Note to Pharmacy: Must keep appt on 10/26/22 for further refills.   06/14/23 1517             Note:  This document was prepared using Dragon voice recognition software and may include unintentional dictation errors.   Pia Mau Beaver, PA-C 06/14/23 1527    Minna Antis, MD 06/14/23 1924

## 2023-06-14 NOTE — ED Triage Notes (Signed)
Patient arrives ambulatory by POV c/o left shoulder pain over past 2 months with worsening pain over past couple days. 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 Denies any known injury. Has been taking ibuprofen twice a day. Patient states she has an appt with new doctor but not until January and is requesting medication refills for her home meds.

## 2023-06-18 NOTE — Group Note (Deleted)

## 2023-07-08 ENCOUNTER — Other Ambulatory Visit: Payer: Self-pay

## 2023-07-08 ENCOUNTER — Emergency Department
Admission: EM | Admit: 2023-07-08 | Discharge: 2023-07-08 | Disposition: A | Discharge status: Home / Self Care | Attending: Emergency Medicine | Admitting: Emergency Medicine

## 2023-07-08 DIAGNOSIS — Z760 Encounter for issue of repeat prescription: Secondary | ICD-10-CM | POA: Insufficient documentation

## 2023-07-08 DIAGNOSIS — I10 Essential (primary) hypertension: Secondary | ICD-10-CM | POA: Diagnosis not present

## 2023-07-08 DIAGNOSIS — Z79899 Other long term (current) drug therapy: Secondary | ICD-10-CM | POA: Diagnosis not present

## 2023-07-08 DIAGNOSIS — G4700 Insomnia, unspecified: Secondary | ICD-10-CM | POA: Insufficient documentation

## 2023-07-08 MED ORDER — TRAZODONE HCL 100 MG PO TABS: 200.0000 mg | ORAL_TABLET | Freq: Every day | ORAL | 0 refills | Status: DC

## 2023-07-08 MED ORDER — TRAZODONE HCL 100 MG PO TABS
200.0000 mg | ORAL_TABLET | Freq: Once | ORAL | Status: AC
  Administered 2023-07-08: 200 mg via ORAL
  Filled 2023-07-08: qty 2

## 2023-07-08 NOTE — ED Provider Notes (Signed)
[facility] Provider Note    Event Date/Time   First MD Initiated Contact with Patient 07/08/23 0255     (approximate)   History   Medication Refill   HPI  Latasha Lawrence is a 52 y.o. female with history of chronic pain, insomnia who presents to the emergency department requesting a refill of her trazodone.  Patient takes 200 mg every night.  She has been out of her medication for 3 days and states she no longer has a primary care provider.  She has not been able to sleep for 3 days.  She denies any other acute complaints and denies the need for any other medication refills today.   History provided by patient.    Past Medical History:  Diagnosis Date   Abnormal weight gain    Anxiety    Chronic back pain    Controlled substance agreement terminated 07/2014   failed UDS at [facility]   Depression    Fibromyalgia    GERD (gastroesophageal reflux disease)    Headache    Hirsutism    Hypertension    Hypokalemia    Neutrophilic leukocytosis    Periodontal disease    Stomach ulcer    Stroke (HCC)    Vitamin D deficiency disease     Past Surgical History:  Procedure Laterality Date   Arm Surgery     CESAREAN SECTION     IR ANGIO INTRA EXTRACRAN SEL COM CAROTID INNOMINATE BILAT MOD SED  04/19/2017   IR ANGIO VERTEBRAL SEL VERTEBRAL BILAT MOD SED  04/19/2017   IR INTRAVSC STENT CERV CAROTID W/O EMB-PROT MOD SED INC ANGIO  04/25/2017   RADIOLOGY WITH ANESTHESIA Left 04/25/2017   Procedure: RADIOLOGY WITH ANESTHESIA LEFT CARDIAC  STENT;  Surgeon: Julieanne Cotton, MD;  Location: [facility] OR;  Service: Radiology;  Laterality: Left;    MEDICATIONS:  Prior to Admission medications   Medication Sig Start Date End Date Taking? Authorizing Provider  traZODone (DESYREL) 100 MG tablet Take 2 tablets (200 mg total) by mouth at bedtime. 07/08/23  Yes Gianina Olinde, Layla Maw, DO  atorvastatin (LIPITOR) 80 MG tablet Take 1 tablet (80 mg total) by mouth daily  at 6 PM. 06/14/23   Orvil Feil, PA-C  cetirizine (ZYRTEC) 10 MG tablet Take 1 tablet (10 mg total) by mouth daily. 01/09/23   Larae Grooms, NP  fluticasone (FLONASE) 50 MCG/ACT nasal spray Place 1 spray into both nostrils in the morning and at bedtime. 10/29/20   Johnson, Megan P, DO  hydrochlorothiazide (HYDRODIURIL) 25 MG tablet Take 1 tablet (25 mg total) by mouth daily. 06/14/23   Orvil Feil, PA-C  hydrOXYzine (VISTARIL) 100 MG capsule Take 1 capsule (100 mg total) by mouth 3 (three) times daily as needed. 05/07/23   Triplett, Cari B, FNP  lidocaine 4 % Place 1 patch onto the skin daily. 06/14/23   Orvil Feil, PA-C  losartan (COZAAR) 50 MG tablet Take 1 tablet (50 mg total) by mouth daily. 05/07/23   Triplett, Rulon Eisenmenger B, FNP  ondansetron (ZOFRAN-ODT) 4 MG disintegrating tablet Take 1 tablet (4 mg total) by mouth every 8 (eight) hours as needed for nausea or vomiting. 03/01/23   Chesley Noon, MD  pantoprazole (PROTONIX) 40 MG tablet Take 1 tablet (40 mg total) by mouth daily. 06/14/23   Orvil Feil, PA-C  potassium chloride SA (KLOR-CON M) 20 MEQ tablet Take 1 tablet (20 mEq total) by mouth daily for 5 days. 03/01/23  03/06/23  Chesley Noon, MD  spironolactone (ALDACTONE) 25 MG tablet Take 1 tablet (25 mg total) by mouth daily. 06/14/23   Orvil Feil, PA-C  venlafaxine XR (EFFEXOR-XR) 150 MG 24 hr capsule Take 1 capsule (150 mg total) by mouth daily with breakfast. 06/14/23   Orvil Feil, PA-C    Physical Exam   Triage Vital Signs: ED Triage Vitals  Encounter Vitals Group     BP 07/08/23 0236 (!) 191/110     Systolic BP Percentile --      Diastolic BP Percentile --      Pulse Rate 07/08/23 0236 99     Resp 07/08/23 0236 17     Temp 07/08/23 0236 99 F (37.2 C)     Temp Source 07/08/23 0236 Oral     SpO2 07/08/23 0236 99 %     Weight 07/08/23 0236 165 lb (74.8 kg)     Height 07/08/23 0236 5\' 6"  (1.676 m)     Head Circumference --      Peak Flow --      Pain Score  07/08/23 0236 0     Pain Loc --      Pain Education --      Exclude from Growth Chart --     Most recent vital signs: Vitals:   07/08/23 0236  BP: (!) 191/110  Pulse: 99  Resp: 17  Temp: 99 F (37.2 C)  SpO2: 99%     CONSTITUTIONAL: Alert and responds appropriately to questions.  Appears anxious HEAD: Normocephalic, atraumatic EYES: Conjunctivae clear, pupils appear equal ENT: normal nose; moist mucous membranes NECK: Normal range of motion CARD: Regular rate and rhythm RESP: Normal chest excursion without splinting or tachypnea; no hypoxia or respiratory distress, speaking full sentences ABD/GI: non-distended EXT: Normal ROM in all joints, no major deformities noted SKIN: Normal color for age and race, no rashes on exposed skin NEURO: Moves all extremities equally, normal speech, no facial asymmetry noted PSYCH: The patient's mood and manner are appropriate. Grooming and personal hygiene are appropriate.  ED Results / Procedures / Treatments   LABS: (all labs ordered are listed, but only abnormal results are displayed) Labs Reviewed - No data to display   EKG:    RADIOLOGY: My personal review and interpretation of imaging:    I have personally reviewed all radiology reports. No results found.   PROCEDURES:  Critical Care performed: No   CRITICAL CARE Performed by: Rochele Raring   Total critical care time: 0 minutes  Critical care time was exclusive of separately billable procedures and treating other patients.  Critical care was necessary to treat or prevent imminent or life-threatening deterioration.  Critical care was time spent personally by me on the following activities: development of treatment plan with patient and/or surrogate as well as nursing, discussions with consultants, evaluation of patient's response to treatment, examination of patient, obtaining history from patient or surrogate, ordering and performing treatments and interventions,  ordering and review of laboratory studies, ordering and review of radiographic studies, pulse oximetry and re-evaluation of patient's condition.   Procedures    IMPRESSION / MDM / ASSESSMENT AND PLAN / ED COURSE  I reviewed the triage vital signs and the nursing notes.   Patient here for medication refill.  States she is out of trazodone has not been able to sleep for 3 days.     DIFFERENTIAL DIAGNOSIS (includes but not limited to):   Medication refill, insomnia, hypertension likely due to lack of  sleep  Patient's presentation is most consistent with acute, uncomplicated illness.  PLAN: Will provide refill of her trazodone.  She is able to show me an empty bottle with prescription instructions on it from previous refills.  She denies that she needs any other medications refilled.  It does look like she was here at the end of July and the beginning of September for the same.  She states she does not have a PCP for refills.  Have placed a referral for primary care doctor and will give her 1 month refill of her trazodone.  No indication for any further emergent workup today.   MEDICATIONS GIVEN IN ED: Medications  traZODone (DESYREL) tablet 200 mg (200 mg Oral Given 07/08/23 0308)     ED COURSE:  At this time, I do not feel there is any life-threatening condition present. I reviewed all nursing notes, vitals, pertinent previous records.  All lab and urine results, EKGs, imaging ordered have been independently reviewed and interpreted by myself.  I reviewed all available radiology reports from any imaging ordered this visit.  Based on my assessment, I feel the patient is safe to be discharged home without further emergent workup and can continue workup as an outpatient as needed. Discussed all findings, treatment plan as well as usual and customary return precautions.  They verbalize understanding and are comfortable with this plan.  Outpatient follow-up has been provided as needed.  All  questions have been answered.    CONSULTS:  NONE   OUTSIDE RECORDS REVIEWED: Reviewed her last note from [facility] on 01/09/2023.  There is a note in epic that she was discharged from their practice on 05/07/2023.     FINAL CLINICAL IMPRESSION(S) / ED DIAGNOSES   Final diagnoses:  Medication refill  Insomnia, unspecified type     Rx / DC Orders   ED Discharge Orders          Ordered    Ambulatory Referral to Primary Care (Establish Care)        07/08/23 0259    traZODone (DESYREL) 100 MG tablet  Daily at bedtime        07/08/23 0300             Note:  This document was prepared using Dragon voice recognition software and may include unintentional dictation errors.   Michayla Mcneil, Layla Maw, DO 07/08/23 [phone]

## 2023-07-08 NOTE — ED Triage Notes (Signed)
Pt reports she usually takes Trazadone 200mg  every night. Pt reports she ran out of her medicine 3 days ago and she has not been able to sleep. Pt talks in complete sentences no respiratory distress noted

## 2023-07-26 ENCOUNTER — Emergency Department
Admission: EM | Admit: 2023-07-26 | Discharge: 2023-07-26 | Disposition: A | Discharge status: Home / Self Care | Attending: Emergency Medicine | Admitting: Emergency Medicine

## 2023-07-26 DIAGNOSIS — G4700 Insomnia, unspecified: Secondary | ICD-10-CM | POA: Diagnosis present

## 2023-07-26 DIAGNOSIS — Z760 Encounter for issue of repeat prescription: Secondary | ICD-10-CM | POA: Insufficient documentation

## 2023-07-26 DIAGNOSIS — I10 Essential (primary) hypertension: Secondary | ICD-10-CM | POA: Insufficient documentation

## 2023-07-26 MED ORDER — ZOLPIDEM TARTRATE ER 6.25 MG PO TBCR: 6.2500 mg | EXTENDED_RELEASE_TABLET | Freq: Every evening | ORAL | 0 refills | Status: DC | PRN

## 2023-07-26 MED ORDER — ONDANSETRON 4 MG PO TBDP: 4.0000 mg | ORAL_TABLET | Freq: Three times a day (TID) | ORAL | 0 refills | Status: DC | PRN

## 2023-07-26 NOTE — ED Provider Notes (Signed)
[facility] Emergency Department Provider Note     Event Date/Time   First MD Initiated Contact with Patient 07/26/23 1236     (approximate)   History   Insomnia   HPI  Latasha Lawrence is a 52 y.o. female with history of HTN, fibromyalgia and anxiety presents to the ED for medication refill for insomnia.  Patient reports she takes trazodone but it has not been helping.  She is requesting a new medication for her insomnia. Patient states she no longer has a PCP following her insomnia.  She has a scheduled appointment with a new PCP on 01/28.  She has since been receiving ED medication refills on this medication for some months now.  She is asymptomatic at this time. She denies polysubstance abuse and recreational drug use/alcohol use.     Physical Exam   Triage Vital Signs: ED Triage Vitals  Encounter Vitals Group     BP 07/26/23 1233 139/64     Systolic BP Percentile --      Diastolic BP Percentile --      Pulse Rate 07/26/23 1233 94     Resp 07/26/23 1233 16     Temp 07/26/23 1233 98.1 F (36.7 C)     Temp Source 07/26/23 1233 Oral     SpO2 07/26/23 1233 98 %     Weight 07/26/23 1234 170 lb (77.1 kg)     Height 07/26/23 1234 5\' 6"  (1.676 m)     Head Circumference --      Peak Flow --      Pain Score 07/26/23 1234 0     Pain Loc --      Pain Education --      Exclude from Growth Chart --     Most recent vital signs: Vitals:   07/26/23 1233  BP: 139/64  Pulse: 94  Resp: 16  Temp: 98.1 F (36.7 C)  SpO2: 98%    General Well appearing.  Awake, no distress. Speaking in complete sentences. Normal speech.   HEENT NCAT. PERRL. EOMI. No rhinorrhea. Mucous membranes are moist.  CV:  Good peripheral perfusion. RRR RESP:  Normal effort. LCTAB ABD:  No distention.   ED Results / Procedures / Treatments   Labs (all labs ordered are listed, but only abnormal results are displayed) Labs Reviewed - No data to display  No results  found.  PROCEDURES:  Critical Care performed: No  Procedures  MEDICATIONS ORDERED IN ED: Medications - No data to display  IMPRESSION / MDM / ASSESSMENT AND PLAN / ED COURSE  I reviewed the triage vital signs and the nursing notes.                               52 y.o. female presents to the emergency department for evaluation and treatment of worsening insomnia with ineffective trazodone prescription. See HPI for further details.   Differential diagnosis includes, but is not limited to medication refill, insomnia, sleep hygiene  Patient's presentation is most consistent with acute, uncomplicated illness.  Patient is alert and oriented.  She is well-appearing.  She is speaking in complete sentences and hemodynamically stable.  She is requesting for a medication to help with her insomnia.  She will trial Ambien.  I encouraged her to keep her scheduled primary care appointment scheduled for 01/28 but have also provided her with a list of providers accepting new patients at this  time.  Patient educated on discontinuing trazodone since it is ineffective and not to combine trazodone and Ambien together. Patient is also requesting Zofran.  Patient is in stable condition for discharge home.  ED precautions are discussed.  Sleep hygiene education provided.   FINAL CLINICAL IMPRESSION(S) / ED DIAGNOSES   Final diagnoses:  Medication refill  Insomnia, unspecified type   Rx / DC Orders   ED Discharge Orders          Ordered    zolpidem (AMBIEN CR) 6.25 MG CR tablet  At bedtime PRN        07/26/23 1309    ondansetron (ZOFRAN-ODT) 4 MG disintegrating tablet  Every 8 hours PRN        07/26/23 1316            Note:  This document was prepared using Dragon voice recognition software and may include unintentional dictation errors.    Kern Reap A, PA-C 07/26/23 1328    Concha Se, MD 07/31/23 [phone]

## 2023-07-26 NOTE — Discharge Instructions (Signed)
You were seen in the ED for medication refill.  Pick up medication in your pharmacy.  Discontinue trazodone since it has not been helping.  Do not combine these medications.  You will need to follow-up with a primary care for further management.  A list has been provided for you below.  Please practice good sleep hygiene has been provided for you in the education packet attached to your discharge papers.  Please go to the following website to schedule new (and existing) patient appointments:   [email or web address]   The following is a list of primary care offices in the area who are accepting new patients at this time.  Please reach out to one of them directly and let them know you would like to schedule an appointment to follow up on an Emergency Department visit, and/or to establish a new primary care provider (PCP).  There are likely other primary care clinics in the are who are accepting new patients, but this is an excellent place to start:  [facility] Lead physician: Dr Shirlee Latch [address] [place], [place] [address] [phone]  [facility] Lead Physician: Dr Alba Cory [address], [place], [place] [address] [phone]  [facility]  Lead Physician: Dr Olevia Perches [address] [place], [place] [address] [phone]  [facility] Lead Physician: Dr Sofie Hartigan [address], [place], [place] [address] [phone]  [facility] Lead Physician: Dr Bari Edward [address] [place], [place], [place] [address] [phone]

## 2023-07-26 NOTE — ED Notes (Signed)
Discharged by Doralee Albino RN

## 2023-07-26 NOTE — ED Triage Notes (Signed)
Pt reports worsening insomnia over the last few weeks since running out of her trazadone. Pt denies pain. NAD noted during triage.

## 2023-07-28 NOTE — Plan of Care (Signed)
[facility] Tonsillectomy/Adenoidectomy, Postoperative PEDS care plan entered in error.

## 2023-08-02 ENCOUNTER — Emergency Department
Admission: EM | Admit: 2023-08-02 | Discharge: 2023-08-02 | Discharge status: Against Medical Advice | Attending: Emergency Medicine | Admitting: Emergency Medicine

## 2023-08-02 DIAGNOSIS — Z5321 Procedure and treatment not carried out due to patient leaving prior to being seen by health care provider: Secondary | ICD-10-CM | POA: Insufficient documentation

## 2023-08-02 DIAGNOSIS — R079 Chest pain, unspecified: Secondary | ICD-10-CM | POA: Diagnosis present

## 2023-08-02 NOTE — ED Triage Notes (Signed)
First Nurse Note;  Pt via [facility] from home. Pt c/o chest pain starting today. Report crack use. EMS gave 324 ASA and 1 Nitroglycerin spray. Pt is A&Ox4 and NAD

## 2023-08-02 NOTE — ED Notes (Signed)
Pt states that she is leaving. IV that was established by EMS was removed.

## 2023-08-04 ENCOUNTER — Other Ambulatory Visit: Payer: Self-pay | Admitting: Nurse Practitioner

## 2023-08-04 ENCOUNTER — Emergency Department

## 2023-08-04 ENCOUNTER — Emergency Department
Admission: EM | Admit: 2023-08-04 | Discharge: 2023-08-04 | Disposition: A | Discharge status: Home / Self Care | Attending: Emergency Medicine | Admitting: Emergency Medicine

## 2023-08-04 ENCOUNTER — Other Ambulatory Visit: Payer: Self-pay

## 2023-08-04 DIAGNOSIS — R079 Chest pain, unspecified: Secondary | ICD-10-CM | POA: Insufficient documentation

## 2023-08-04 LAB — CBC
HCT: 50.2 % — ABNORMAL HIGH (ref 36.0–46.0)
Hemoglobin: 16.7 g/dL — ABNORMAL HIGH (ref 12.0–15.0)
MCH: 29.3 pg (ref 26.0–34.0)
MCHC: 33.3 g/dL (ref 30.0–36.0)
MCV: 88.1 fL (ref 80.0–100.0)
Platelets: 325 10*3/uL (ref 150–400)
RBC: 5.7 MIL/uL — ABNORMAL HIGH (ref 3.87–5.11)
RDW: 14.7 % (ref 11.5–15.5)
WBC: 12.1 10*3/uL — ABNORMAL HIGH (ref 4.0–10.5)
nRBC: 0 % (ref 0.0–0.2)

## 2023-08-04 LAB — BASIC METABOLIC PANEL
Anion gap: 13 (ref 5–15)
BUN: 17 mg/dL (ref 6–20)
CO2: 25 mmol/L (ref 22–32)
Calcium: 9.9 mg/dL (ref 8.9–10.3)
Chloride: 97 mmol/L — ABNORMAL LOW (ref 98–111)
Creatinine, Ser: 0.76 mg/dL (ref 0.44–1.00)
GFR, Estimated: >60 mL/min (ref >60)
Glucose, Bld: 127 mg/dL — ABNORMAL HIGH (ref 70–99)
Potassium: 3.3 mmol/L — ABNORMAL LOW (ref 3.5–5.1)
Sodium: 135 mmol/L (ref 135–145)

## 2023-08-04 LAB — TROPONIN I (HIGH SENSITIVITY): Troponin I (High Sensitivity): 3 ng/L (ref <18)

## 2023-08-04 MED ORDER — TRAZODONE HCL 100 MG PO TABS: 200.0000 mg | ORAL_TABLET | Freq: Every day | ORAL | 0 refills | Status: DC

## 2023-08-04 MED ORDER — LOSARTAN POTASSIUM 50 MG PO TABS: 50.0000 mg | ORAL_TABLET | Freq: Every day | ORAL | 0 refills | Status: DC

## 2023-08-04 MED ORDER — SPIRONOLACTONE 25 MG PO TABS: 25.0000 mg | ORAL_TABLET | Freq: Every day | ORAL | 0 refills | Status: DC

## 2023-08-04 NOTE — ED Notes (Signed)
Pt to Xray.

## 2023-08-04 NOTE — ED Triage Notes (Addendum)
Pt via POV from home. Pt c/o L sided chest pain that yesterday morning. Non-radiating Report some NV, pt does have a hx of crack cocaine use. EMS reported crack use when pt was seen on 10/24 but when this RN asked when last use pt stated "ages ago." Pt was here and LWBS on 10/24. Pt is A&OX4 and NAD, ambulatory to triage.

## 2023-08-04 NOTE — ED Provider Notes (Signed)
[facility] Provider Note   Event Date/Time   First MD Initiated Contact with Patient 08/04/23 0940     (approximate) History  Chest Pain  HPI Latasha SCHURIG is a 52 y.o. female with a past medical history of crack cocaine abuse and anxiety who presents complaining of chest pain that has been intermittent over the last few weeks.  Patient states that this pain is becoming worse after she was changed from trazodone to zolpidem.  Patient also endorses continued crack cocaine use.  Patient endorses exertional worsening of this pain but denies any associated shortness of breath. ROS: Patient currently denies any vision changes, tinnitus, difficulty speaking, facial droop, sore throat, shortness of breath, abdominal pain, nausea/vomiting/diarrhea, dysuria, or weakness/numbness/paresthesias in any extremity   Physical Exam  Triage Vital Signs: ED Triage Vitals  Encounter Vitals Group     BP 08/04/23 0942 (!) 154/106     Systolic BP Percentile --      Diastolic BP Percentile --      Pulse Rate 08/04/23 0943 76     Resp 08/04/23 0943 17     Temp 08/04/23 0943 98 F (36.7 C)     Temp Source 08/04/23 0943 Oral     SpO2 08/04/23 0943 98 %     Weight 08/04/23 0930 170 lb (77.1 kg)     Height 08/04/23 0930 5\' 6"  (1.676 m)     Head Circumference --      Peak Flow --      Pain Score 08/04/23 0930 7     Pain Loc --      Pain Education --      Exclude from Growth Chart --    Most recent vital signs: Vitals:   08/04/23 1000 08/04/23 1030  BP: (!) 156/93 (!) 153/93  Pulse: 66 71  Resp: 16 17  Temp:    SpO2: 96% 94%   General: Awake, oriented x4. CV:  Good peripheral perfusion.  Resp:  Normal effort.  Abd:  No distention.  Other:  Middle-aged overweight Caucasian female resting comfortably in no acute distress ED Results / Procedures / Treatments  Labs (all labs ordered are listed, but only abnormal results are displayed) Labs Reviewed  BASIC METABOLIC PANEL -  Abnormal; Notable for the following components:      Result Value   Potassium 3.3 (*)    Chloride 97 (*)    Glucose, Bld 127 (*)    All other components within normal limits  CBC - Abnormal; Notable for the following components:   WBC 12.1 (*)    RBC 5.70 (*)    Hemoglobin 16.7 (*)    HCT 50.2 (*)    All other components within normal limits  TROPONIN I (HIGH SENSITIVITY)   EKG ED ECG REPORT I, Merwyn Katos, the attending physician, personally viewed and interpreted this ECG. Date: 08/04/2023 EKG Time: 0933 Rate: 84 Rhythm: normal sinus rhythm QRS Axis: normal Intervals: normal ST/T Wave abnormalities: normal Narrative Interpretation: no evidence of acute ischemia RADIOLOGY ED MD interpretation: 2 view chest x-ray interpreted by me shows no evidence of acute abnormalities including no pneumonia, pneumothorax, or widened mediastinum -Agree with radiology assessment Official radiology report(s): DG Chest 2 View  Result Date: 08/04/2023 CLINICAL DATA:  Chest pain for 1 day. EXAM: CHEST - 2 VIEW COMPARISON:  Chest radiograph dated 11/04/2022. FINDINGS: The heart size and mediastinal contours are within normal limits. Both lungs are clear. The visualized skeletal structures are unremarkable.  IMPRESSION: No active cardiopulmonary disease. Electronically Signed   By: Romona Curls M.D.   On: 08/04/2023 10:54   PROCEDURES: Critical Care performed: No .1-3 Lead EKG Interpretation  Performed by: Merwyn Katos, MD Authorized by: Merwyn Katos, MD     Interpretation: normal     ECG rate:  71   ECG rate assessment: normal     Rhythm: sinus rhythm     Ectopy: none     Conduction: normal    MEDICATIONS ORDERED IN ED: Medications - No data to display IMPRESSION / MDM / ASSESSMENT AND PLAN / ED COURSE  I reviewed the triage vital signs and the nursing notes.                             The patient is on the cardiac monitor to evaluate for evidence of arrhythmia and/or  significant heart rate changes. Patient's presentation is most consistent with acute presentation with potential threat to life or bodily function. Workup: ECG, CXR, CBC, BMP, Troponin Findings: ECG: No overt evidence of STEMI. No evidence of Brugada's sign, delta wave, epsilon wave, significantly prolonged QTc, or malignant arrhythmia HS Troponin: Negative x1 Other Labs unremarkable for emergent problems. CXR: Without PTX, PNA, or widened mediastinum Last Stress Test:  never Last Heart Catheterization:  never HEART Score: 3  Given History, Exam, and Workup I have low suspicion for ACS, Pneumothorax, Pneumonia, Pulmonary Embolus, Tamponade, Aortic Dissection or other emergent problem as a cause for this presentation.   Reassesment: Prior to discharge patient's pain was controlled and they were well appearing.  Disposition:  Discharge. Strict return precautions discussed with patient with full understanding. Advised patient to follow up promptly with primary care provider    FINAL CLINICAL IMPRESSION(S) / ED DIAGNOSES   Final diagnoses:  Chest pain, unspecified type   Rx / DC Orders   ED Discharge Orders          Ordered    Ambulatory referral to Cardiology       Comments: If you have not heard from the Cardiology office within the next 72 hours please call [phone].   08/04/23 1048    traZODone (DESYREL) 100 MG tablet  Daily at bedtime        08/04/23 1050    losartan (COZAAR) 50 MG tablet  Daily        08/04/23 1050    spironolactone (ALDACTONE) 25 MG tablet  Daily        08/04/23 1050           Note:  This document was prepared using Dragon voice recognition software and may include unintentional dictation errors.   Merwyn Katos, MD 08/04/23 1140

## 2023-08-05 ENCOUNTER — Other Ambulatory Visit: Payer: Self-pay

## 2023-08-05 ENCOUNTER — Observation Stay

## 2023-08-05 ENCOUNTER — Emergency Department

## 2023-08-05 ENCOUNTER — Observation Stay
Admission: EM | Admit: 2023-08-05 | Discharge: 2023-08-09 | Disposition: A | Discharge status: Home / Self Care | Attending: Internal Medicine | Admitting: Internal Medicine

## 2023-08-05 DIAGNOSIS — E876 Hypokalemia: Secondary | ICD-10-CM | POA: Diagnosis present

## 2023-08-05 DIAGNOSIS — Z79899 Other long term (current) drug therapy: Secondary | ICD-10-CM | POA: Diagnosis not present

## 2023-08-05 DIAGNOSIS — F17210 Nicotine dependence, cigarettes, uncomplicated: Secondary | ICD-10-CM | POA: Diagnosis not present

## 2023-08-05 DIAGNOSIS — Z1152 Encounter for screening for COVID-19: Secondary | ICD-10-CM | POA: Diagnosis not present

## 2023-08-05 DIAGNOSIS — G4700 Insomnia, unspecified: Secondary | ICD-10-CM | POA: Diagnosis not present

## 2023-08-05 DIAGNOSIS — R6510 Systemic inflammatory response syndrome (SIRS) of non-infectious origin without acute organ dysfunction: Secondary | ICD-10-CM

## 2023-08-05 DIAGNOSIS — M1990 Unspecified osteoarthritis, unspecified site: Secondary | ICD-10-CM | POA: Diagnosis present

## 2023-08-05 DIAGNOSIS — F32A Depression, unspecified: Secondary | ICD-10-CM | POA: Diagnosis present

## 2023-08-05 DIAGNOSIS — F17200 Nicotine dependence, unspecified, uncomplicated: Secondary | ICD-10-CM | POA: Diagnosis present

## 2023-08-05 DIAGNOSIS — R0789 Other chest pain: Principal | ICD-10-CM

## 2023-08-05 DIAGNOSIS — R072 Precordial pain: Principal | ICD-10-CM | POA: Diagnosis present

## 2023-08-05 DIAGNOSIS — Z955 Presence of coronary angioplasty implant and graft: Secondary | ICD-10-CM | POA: Insufficient documentation

## 2023-08-05 DIAGNOSIS — Z8673 Personal history of transient ischemic attack (TIA), and cerebral infarction without residual deficits: Secondary | ICD-10-CM

## 2023-08-05 DIAGNOSIS — I10 Essential (primary) hypertension: Secondary | ICD-10-CM | POA: Insufficient documentation

## 2023-08-05 DIAGNOSIS — F419 Anxiety disorder, unspecified: Secondary | ICD-10-CM | POA: Diagnosis present

## 2023-08-05 DIAGNOSIS — I773 Arterial fibromuscular dysplasia: Secondary | ICD-10-CM

## 2023-08-05 DIAGNOSIS — M797 Fibromyalgia: Secondary | ICD-10-CM | POA: Diagnosis present

## 2023-08-05 DIAGNOSIS — M545 Low back pain, unspecified: Secondary | ICD-10-CM | POA: Insufficient documentation

## 2023-08-05 DIAGNOSIS — K219 Gastro-esophageal reflux disease without esophagitis: Secondary | ICD-10-CM

## 2023-08-05 DIAGNOSIS — D72829 Elevated white blood cell count, unspecified: Secondary | ICD-10-CM | POA: Diagnosis not present

## 2023-08-05 DIAGNOSIS — G8929 Other chronic pain: Secondary | ICD-10-CM | POA: Diagnosis present

## 2023-08-05 DIAGNOSIS — F1120 Opioid dependence, uncomplicated: Secondary | ICD-10-CM | POA: Diagnosis present

## 2023-08-05 LAB — TROPONIN I (HIGH SENSITIVITY)
Troponin I (High Sensitivity): 4 ng/L (ref <18)
Troponin I (High Sensitivity): 5 ng/L (ref <18)

## 2023-08-05 LAB — MAGNESIUM: Magnesium: 1.9 mg/dL (ref 1.7–2.4)

## 2023-08-05 LAB — BASIC METABOLIC PANEL
Anion gap: 11 (ref 5–15)
BUN: 14 mg/dL (ref 6–20)
CO2: 26 mmol/L (ref 22–32)
Calcium: 10 mg/dL (ref 8.9–10.3)
Chloride: 99 mmol/L (ref 98–111)
Creatinine, Ser: 0.87 mg/dL (ref 0.44–1.00)
GFR, Estimated: >60 mL/min (ref >60)
Glucose, Bld: 146 mg/dL — ABNORMAL HIGH (ref 70–99)
Potassium: 2.8 mmol/L — ABNORMAL LOW (ref 3.5–5.1)
Sodium: 136 mmol/L (ref 135–145)

## 2023-08-05 LAB — URINALYSIS, COMPLETE (UACMP) WITH MICROSCOPIC
Bilirubin Urine: NEGATIVE
Glucose, UA: NEGATIVE mg/dL
Ketones, ur: NEGATIVE mg/dL
Nitrite: NEGATIVE
Protein, ur: NEGATIVE mg/dL
Specific Gravity, Urine: 1.02 (ref 1.005–1.030)
pH: 6 (ref 5.0–8.0)

## 2023-08-05 LAB — CBC
HCT: 48.6 % — ABNORMAL HIGH (ref 36.0–46.0)
Hemoglobin: 16.7 g/dL — ABNORMAL HIGH (ref 12.0–15.0)
MCH: 29.3 pg (ref 26.0–34.0)
MCHC: 34.4 g/dL (ref 30.0–36.0)
MCV: 85.4 fL (ref 80.0–100.0)
Platelets: 370 10*3/uL (ref 150–400)
RBC: 5.69 MIL/uL — ABNORMAL HIGH (ref 3.87–5.11)
RDW: 14.5 % (ref 11.5–15.5)
WBC: 16.9 10*3/uL — ABNORMAL HIGH (ref 4.0–10.5)
nRBC: 0 % (ref 0.0–0.2)

## 2023-08-05 LAB — URINE DRUG SCREEN, QUALITATIVE (ARMC ONLY)
Amphetamines, Ur Screen: NOT DETECTED
Barbiturates, Ur Screen: NOT DETECTED
Benzodiazepine, Ur Scrn: NOT DETECTED
Cannabinoid 50 Ng, Ur ~~LOC~~: POSITIVE — AB
Cocaine Metabolite,Ur ~~LOC~~: NOT DETECTED
MDMA (Ecstasy)Ur Screen: NOT DETECTED
Methadone Scn, Ur: NOT DETECTED
Opiate, Ur Screen: POSITIVE — AB
Phencyclidine (PCP) Ur S: NOT DETECTED
Tricyclic, Ur Screen: NOT DETECTED

## 2023-08-05 LAB — RESP PANEL BY RT-PCR (RSV, FLU A&B, COVID)  RVPGX2
Influenza A by PCR: NEGATIVE
Influenza B by PCR: NEGATIVE
Resp Syncytial Virus by PCR: NEGATIVE
SARS Coronavirus 2 by RT PCR: NEGATIVE

## 2023-08-05 MED ORDER — HYDROXYZINE HCL 25 MG PO TABS: 100.0000 mg | ORAL_TABLET | Freq: Three times a day (TID) | ORAL | Status: DC | PRN

## 2023-08-05 MED ORDER — POTASSIUM CHLORIDE CRYS ER 20 MEQ PO TBCR
40.0000 meq | EXTENDED_RELEASE_TABLET | Freq: Once | ORAL | Status: AC
  Administered 2023-08-06: 40 meq via ORAL
  Filled 2023-08-05: qty 2

## 2023-08-05 MED ORDER — TRAZODONE HCL 100 MG PO TABS
200.0000 mg | ORAL_TABLET | Freq: Every day | ORAL | Status: DC
  Administered 2023-08-05 – 2023-08-08 (×4): 200 mg via ORAL
  Filled 2023-08-05 (×4): qty 2

## 2023-08-05 MED ORDER — HYDROCHLOROTHIAZIDE 25 MG PO TABS
25.0000 mg | ORAL_TABLET | Freq: Every day | ORAL | Status: DC
  Administered 2023-08-06 – 2023-08-09 (×4): 25 mg via ORAL
  Filled 2023-08-05 (×4): qty 1

## 2023-08-05 MED ORDER — ASPIRIN 300 MG RE SUPP: 300.0000 mg | RECTAL | Status: AC

## 2023-08-05 MED ORDER — ONDANSETRON HCL 4 MG/2ML IJ SOLN
4.0000 mg | Freq: Once | INTRAMUSCULAR | Status: AC
  Administered 2023-08-05: 4 mg via INTRAVENOUS
  Filled 2023-08-05: qty 2

## 2023-08-05 MED ORDER — ONDANSETRON HCL 4 MG/2ML IJ SOLN
4.0000 mg | Freq: Four times a day (QID) | INTRAMUSCULAR | Status: DC | PRN
Start: 2023-08-05 — End: 2023-08-09
  Administered 2023-08-08 (×2): 4 mg via INTRAVENOUS
  Filled 2023-08-05 (×2): qty 2

## 2023-08-05 MED ORDER — HYDROMORPHONE HCL 1 MG/ML IJ SOLN
0.5000 mg | Freq: Once | INTRAMUSCULAR | Status: AC
  Administered 2023-08-05: 0.5 mg via INTRAVENOUS
  Filled 2023-08-05: qty 0.5

## 2023-08-05 MED ORDER — ATORVASTATIN CALCIUM 80 MG PO TABS
80.0000 mg | ORAL_TABLET | Freq: Every day | ORAL | Status: DC
  Administered 2023-08-06 – 2023-08-08 (×3): 80 mg via ORAL
  Filled 2023-08-05 (×3): qty 1

## 2023-08-05 MED ORDER — NICOTINE 21 MG/24HR TD PT24
21.0000 mg | MEDICATED_PATCH | Freq: Every day | TRANSDERMAL | Status: DC
  Administered 2023-08-05 – 2023-08-06 (×2): 21 mg via TRANSDERMAL
  Filled 2023-08-05 (×3): qty 1

## 2023-08-05 MED ORDER — IOHEXOL 350 MG/ML SOLN
75.0000 mL | Freq: Once | INTRAVENOUS | Status: AC | PRN
  Administered 2023-08-05: 75 mL via INTRAVENOUS

## 2023-08-05 MED ORDER — ACETAMINOPHEN 325 MG PO TABS
650.0000 mg | ORAL_TABLET | ORAL | Status: DC | PRN
  Administered 2023-08-06 – 2023-08-08 (×5): 650 mg via ORAL
  Filled 2023-08-05 (×5): qty 2

## 2023-08-05 MED ORDER — ASPIRIN 81 MG PO TBEC
81.0000 mg | DELAYED_RELEASE_TABLET | Freq: Every day | ORAL | Status: DC
  Administered 2023-08-06 – 2023-08-09 (×4): 81 mg via ORAL
  Filled 2023-08-05 (×4): qty 1

## 2023-08-05 MED ORDER — NITROGLYCERIN 0.4 MG SL SUBL: 0.4000 mg | SUBLINGUAL_TABLET | SUBLINGUAL | Status: DC | PRN

## 2023-08-05 MED ORDER — NITROGLYCERIN 0.4 MG SL SUBL
0.4000 mg | SUBLINGUAL_TABLET | SUBLINGUAL | Status: DC | PRN
  Administered 2023-08-06 (×2): 0.4 mg via SUBLINGUAL
  Filled 2023-08-05: qty 1

## 2023-08-05 MED ORDER — POTASSIUM CHLORIDE 10 MEQ/100ML IV SOLN
10.0000 meq | INTRAVENOUS | Status: AC
  Administered 2023-08-05: 10 meq via INTRAVENOUS
  Filled 2023-08-05: qty 100

## 2023-08-05 MED ORDER — ENOXAPARIN SODIUM 40 MG/0.4ML IJ SOSY
40.0000 mg | PREFILLED_SYRINGE | INTRAMUSCULAR | Status: DC
  Administered 2023-08-06 – 2023-08-08 (×3): 40 mg via SUBCUTANEOUS
  Filled 2023-08-05 (×3): qty 0.4

## 2023-08-05 MED ORDER — VENLAFAXINE HCL ER 75 MG PO CP24
150.0000 mg | ORAL_CAPSULE | Freq: Every day | ORAL | Status: DC
  Administered 2023-08-06 – 2023-08-09 (×4): 150 mg via ORAL
  Filled 2023-08-05 (×2): qty 2
  Filled 2023-08-05: qty 1
  Filled 2023-08-05 (×2): qty 2

## 2023-08-05 MED ORDER — POTASSIUM CHLORIDE CRYS ER 20 MEQ PO TBCR
40.0000 meq | EXTENDED_RELEASE_TABLET | Freq: Once | ORAL | Status: AC
  Administered 2023-08-05: 40 meq via ORAL
  Filled 2023-08-05: qty 2

## 2023-08-05 MED ORDER — LOSARTAN POTASSIUM 50 MG PO TABS
50.0000 mg | ORAL_TABLET | Freq: Every day | ORAL | Status: DC
  Administered 2023-08-06 – 2023-08-09 (×4): 50 mg via ORAL
  Filled 2023-08-05 (×4): qty 1

## 2023-08-05 MED ORDER — SPIRONOLACTONE 25 MG PO TABS
25.0000 mg | ORAL_TABLET | Freq: Every day | ORAL | Status: DC
  Administered 2023-08-06 – 2023-08-09 (×4): 25 mg via ORAL
  Filled 2023-08-05 (×4): qty 1

## 2023-08-05 MED ORDER — ASPIRIN 81 MG PO CHEW
324.0000 mg | CHEWABLE_TABLET | ORAL | Status: AC
  Filled 2023-08-05: qty 4

## 2023-08-05 MED ORDER — PANTOPRAZOLE SODIUM 40 MG PO TBEC
40.0000 mg | DELAYED_RELEASE_TABLET | Freq: Every day | ORAL | Status: DC
  Administered 2023-08-05 – 2023-08-09 (×5): 40 mg via ORAL
  Filled 2023-08-05 (×5): qty 1

## 2023-08-05 NOTE — Assessment & Plan Note (Signed)
 Nicotine patch and tobacco cessation counseling

## 2023-08-05 NOTE — ED Triage Notes (Signed)
Pt c/o constant left sided chest pain x3 days w/ N/V. Pt denies SHOB, dizziness. Pt is AOX4, NAD noted, respirations even and unlabored.

## 2023-08-05 NOTE — Assessment & Plan Note (Signed)
Patient with history of hypokalemia going back several years, attributed to Lasix Received oral and IV repletion in the ED Continue to monitor and replete as needed

## 2023-08-05 NOTE — Assessment & Plan Note (Addendum)
Fibromyalgia/chronic pain Continue trazodone, venlafaxine and hydralazine

## 2023-08-05 NOTE — H&P (Incomplete)
History and Physical    Patient: Latasha Lawrence MWU:[id number] DOB: 09/27/1971 DOA: 08/05/2023 DOS: the patient was seen and examined on 08/05/2023 PCP: Patient, No Pcp Per  Patient coming from: Home  Chief Complaint:  Chief Complaint  Patient presents with   Chest Pain    HPI: Latasha Lawrence is a 52 y.o. 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  Patient was seen in the ED the day prior with chest pain and had a reassuring workup and was discharged.  She returns a day later with continued left-sided chest pain now associated with nausea and vomiting.  She denies cough, fever or chills or shortness of breath denies abdominal pain diarrhea or dysuria.  Denies  headache or visual disturbance. Of note, patient was hospitalized in 2015 for chest pain and had an normal nuclear stress test and pain was attributed to musculoskeletal pain at the time.   ED course and data review: BP on arrival 158/131, temp 99.1 with pulse 99, respirations 22 and O2 sat 94% on room air. Labs notable for WBC 17,000, hemoglobin 16, potassium 2.8, magnesium 1.9 Troponin 4 Respiratory viral panel negative for COVID flu and RSV UDS positive for opiates and cannabinoids EKG, personally viewed and interpreted showed sinus tachycardia with J-point depression lateral leads unchanged from the day prior CTA chest negative for PE or other acute abnormality Patient received potassium oral and IV as well as hydromorphone and nitroglycerin with some improvement BP improved with pain treatment to 129/71 Hospitalist consulted for admission.   Review of Systems: As mentioned in the history of present illness. All other systems  reviewed and are negative.  Past Medical History:  Diagnosis Date   Abnormal weight gain    Anxiety    Chronic back pain    Controlled substance agreement terminated 07/2014   failed UDS at [facility]   Depression    Fibromyalgia    GERD (gastroesophageal reflux disease)    Headache    Hirsutism    Hypertension    Hypokalemia    Neutrophilic leukocytosis    Periodontal disease    Stomach ulcer    Stroke (HCC)    Vitamin D deficiency disease    Past Surgical History:  Procedure Laterality Date   Arm Surgery     CESAREAN SECTION     IR ANGIO INTRA EXTRACRAN SEL COM CAROTID INNOMINATE BILAT MOD SED  04/19/2017   IR ANGIO VERTEBRAL SEL VERTEBRAL BILAT MOD SED  04/19/2017   IR INTRAVSC STENT CERV CAROTID W/O EMB-PROT MOD SED INC ANGIO  04/25/2017   RADIOLOGY WITH ANESTHESIA Left 04/25/2017   Procedure: RADIOLOGY WITH ANESTHESIA LEFT CARDIAC  STENT;  Surgeon: Julieanne Cotton, MD;  Location: [facility] OR;  Service: Radiology;  Laterality: Left;   Social History:  reports that she has been smoking cigarettes. She has a 20 pack-year smoking history. She has never used smokeless tobacco. She reports that she does not drink alcohol and does not use drugs.  Allergies  Allergen Reactions   Nsaids Other (See Comments)    Causes ulcers to bleed   Morphine And Codeine Itching    Family History  Problem Relation Age of Onset   Arthritis Mother    Cancer Mother  breast   Heart failure Mother    Hyperlipidemia Father    Hypertension Father    Stroke Maternal Grandmother     Prior to Admission medications   Medication Sig Start Date End Date Taking? Authorizing Provider  atorvastatin (LIPITOR) 80 MG tablet Take 1 tablet (80 mg total) by mouth daily at 6 PM. 06/14/23   Orvil Feil, PA-C  cetirizine (ZYRTEC) 10 MG tablet Take 1 tablet (10 mg total) by mouth daily. 01/09/23   Larae Grooms, NP  fluticasone (FLONASE) 50 MCG/ACT nasal spray Place 1 spray into both  nostrils in the morning and at bedtime. 10/29/20   Johnson, Megan P, DO  hydrochlorothiazide (HYDRODIURIL) 25 MG tablet Take 1 tablet (25 mg total) by mouth daily. 06/14/23   Orvil Feil, PA-C  hydrOXYzine (VISTARIL) 100 MG capsule Take 1 capsule (100 mg total) by mouth 3 (three) times daily as needed. 05/07/23   Triplett, Cari B, FNP  lidocaine 4 % Place 1 patch onto the skin daily. 06/14/23   Orvil Feil, PA-C  losartan (COZAAR) 50 MG tablet Take 1 tablet (50 mg total) by mouth daily. 08/04/23   Merwyn Katos, MD  ondansetron (ZOFRAN-ODT) 4 MG disintegrating tablet Take 1 tablet (4 mg total) by mouth every 8 (eight) hours as needed. 07/26/23   Romeo Apple, Myah A, PA-C  pantoprazole (PROTONIX) 40 MG tablet Take 1 tablet (40 mg total) by mouth daily. 06/14/23   Orvil Feil, PA-C  potassium chloride SA (KLOR-CON M) 20 MEQ tablet Take 1 tablet (20 mEq total) by mouth daily for 5 days. 03/01/23 03/06/23  Chesley Noon, MD  spironolactone (ALDACTONE) 25 MG tablet Take 1 tablet (25 mg total) by mouth daily. 08/04/23   Merwyn Katos, MD  traZODone (DESYREL) 100 MG tablet Take 2 tablets (200 mg total) by mouth at bedtime. 08/04/23   Merwyn Katos, MD  venlafaxine XR (EFFEXOR-XR) 150 MG 24 hr capsule Take 1 capsule (150 mg total) by mouth daily with breakfast. 06/14/23   Orvil Feil, PA-C  zolpidem (AMBIEN CR) 6.25 MG CR tablet Take 1 tablet (6.25 mg total) by mouth at bedtime as needed for sleep. 07/26/23   Kern Reap A, PA-C    Physical Exam: Vitals:   08/05/23 1930 08/05/23 1945 08/05/23 2000 08/05/23 2015  BP: (!) 140/87     Pulse: 74 74 77 72  Resp:   14   Temp:      SpO2: 95% 99% 98% 98%   Physical Exam Vitals and nursing note reviewed.  Constitutional:      General: She is not in acute distress. HENT:     Head: Normocephalic and atraumatic.  Cardiovascular:     Rate and Rhythm: Normal rate and regular rhythm.     Heart sounds: Normal heart sounds.  Pulmonary:     Effort:  Pulmonary effort is normal.     Breath sounds: Normal breath sounds.  Abdominal:     Palpations: Abdomen is soft.     Tenderness: There is no abdominal tenderness.  Neurological:     Mental Status: Mental status is at baseline.     Labs on Admission: I have personally reviewed following labs and imaging studies  CBC: Recent Labs  Lab 08/04/23 1000 08/05/23 1730  WBC 12.1* 16.9*  HGB 16.7* 16.7*  HCT 50.2* 48.6*  MCV 88.1 85.4  PLT 325 370   Basic Metabolic Panel: Recent Labs  Lab 08/04/23 1000 08/05/23 1730 08/05/23 1822  NA  135 136  --   K 3.3* 2.8*  --   CL 97* 99  --   CO2 25 26  --   GLUCOSE 127* 146*  --   BUN 17 14  --   CREATININE 0.76 0.87  --   CALCIUM 9.9 10.0  --   MG  --   --  1.9   GFR: Estimated Creatinine Clearance: 81.1 mL/min (by C-G formula based on SCr of 0.87 mg/dL). Liver Function Tests: No results for input(s): "AST", "ALT", "ALKPHOS", "BILITOT", "PROT", "ALBUMIN" in the last 168 hours. No results for input(s): "LIPASE", "AMYLASE" in the last 168 hours. No results for input(s): "AMMONIA" in the last 168 hours. Coagulation Profile: No results for input(s): "INR", "PROTIME" in the last 168 hours. Cardiac Enzymes: No results for input(s): "CKTOTAL", "CKMB", "CKMBINDEX", "TROPONINI" in the last 168 hours. BNP (last 3 results) No results for input(s): "PROBNP" in the last 8760 hours. HbA1C: No results for input(s): "HGBA1C" in the last 72 hours. CBG: No results for input(s): "GLUCAP" in the last 168 hours. Lipid Profile: No results for input(s): "CHOL", "HDL", "LDLCALC", "TRIG", "CHOLHDL", "LDLDIRECT" in the last 72 hours. Thyroid Function Tests: No results for input(s): "TSH", "T4TOTAL", "FREET4", "T3FREE", "THYROIDAB" in the last 72 hours. Anemia Panel: No results for input(s): "VITAMINB12", "FOLATE", "FERRITIN", "TIBC", "IRON", "RETICCTPCT" in the last 72 hours. Urine analysis:    Component Value Date/Time   COLORURINE YELLOW (A)  03/01/2023 1434   APPEARANCEUR TURBID (A) 03/01/2023 1434   APPEARANCEUR Clear 12/05/2022 1136   LABSPEC 1.014 03/01/2023 1434   LABSPEC 1.020 09/06/2014 0132   PHURINE 7.0 03/01/2023 1434   GLUCOSEU NEGATIVE 03/01/2023 1434   GLUCOSEU Negative 09/06/2014 0132   HGBUR NEGATIVE 03/01/2023 1434   BILIRUBINUR NEGATIVE 03/01/2023 1434   BILIRUBINUR Negative 12/05/2022 1136   BILIRUBINUR Negative 09/06/2014 0132   KETONESUR NEGATIVE 03/01/2023 1434   PROTEINUR NEGATIVE 03/01/2023 1434   NITRITE NEGATIVE 03/01/2023 1434   LEUKOCYTESUR NEGATIVE 03/01/2023 1434   LEUKOCYTESUR Negative 09/06/2014 0132    Radiological Exams on Admission: CT Angio Chest PE W and/or Wo Contrast  Result Date: 08/05/2023 CLINICAL DATA:  Chest pain and left shoulder pain for 3 days EXAM: CT ANGIOGRAPHY CHEST WITH CONTRAST TECHNIQUE: Multidetector CT imaging of the chest was performed using the standard protocol during bolus administration of intravenous contrast. Multiplanar CT image reconstructions and MIPs were obtained to evaluate the vascular anatomy. RADIATION DOSE REDUCTION: This exam was performed according to the departmental dose-optimization program which includes automated exposure control, adjustment of the mA and/or kV according to patient size and/or use of iterative reconstruction technique. CONTRAST:  75mL OMNIPAQUE IOHEXOL 350 MG/ML SOLN COMPARISON:  Radiograph 08/05/2023 and CTA chest 11/04/2022 FINDINGS: Cardiovascular: Negative for acute pulmonary embolism. Normal caliber thoracic aorta. No pericardial effusion. Mediastinum/Nodes: Trachea and esophagus are unremarkable. No thoracic adenopathy Lungs/Pleura: No focal consolidation, pleural effusion, or pneumothorax. Upper Abdomen: No acute abnormality. Musculoskeletal: No acute fracture. Review of the MIP images confirms the above findings. IMPRESSION: Negative for acute pulmonary embolism. No acute abnormality in the chest. Electronically Signed   By:  Minerva Fester M.D.   On: 08/05/2023 19:17   DG Chest 2 View  Result Date: 08/05/2023 CLINICAL DATA:  chest pain EXAM: CHEST - 2 VIEW COMPARISON:  August 04, 2023 FINDINGS: The cardiomediastinal silhouette is unchanged in contour. No pleural effusion. No pneumothorax. No acute pleuroparenchymal abnormality. Visualized abdomen is unremarkable. Mild degenerative changes of the thoracolumbar junction. IMPRESSION: No acute cardiopulmonary abnormality.  Electronically Signed   By: Meda Klinefelter M.D.   On: 08/05/2023 17:52   DG Chest 2 View  Result Date: 08/04/2023 CLINICAL DATA:  Chest pain for 1 day. EXAM: CHEST - 2 VIEW COMPARISON:  Chest radiograph dated 11/04/2022. FINDINGS: The heart size and mediastinal contours are within normal limits. Both lungs are clear. The visualized skeletal structures are unremarkable. IMPRESSION: No active cardiopulmonary disease. Electronically Signed   By: Romona Curls M.D.   On: 08/04/2023 10:54     Data Reviewed: Relevant notes from primary care and specialist visits, past discharge summaries as available in EHR, including Care Everywhere. Prior diagnostic testing as pertinent to current admission diagnoses Updated medications and problem lists for reconciliation ED course, including vitals, labs, imaging, treatment and response to treatment Triage notes, nursing and pharmacy notes and ED provider's notes Notable results as noted in HPI   Assessment and Plan: * Precordial chest pain Troponins 4-->5 and EKG showing possible ST depression though stable from the day prior UDS negative for cocaine (history of prior use several years prior close) BP was elevated to 151/131 on arrival which could be related to pain Pain relief with Dilaudid x 2 in the ED Nitroglycerin sublingual as needed chest pain Aspirin and statin Echocardiogram in the a.m. 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  "no further invasive testing or surgery at this time".  Recommendation to stop Plavix and begin aspirin with rescan in a year -We will start aspirin and statin -Carotid Doppler --> 0-49% bilat -Tobacco cessation counseling    Hypokalemia Patient with history of hypokalemia going back several years, attributed to Lasix Received oral and IV repletion in the ED Continue to monitor and replete as needed  Polysubstance dependence including opioid drug with daily use (HCC) UDS positive for opiates and cannabinoids.  No cocaine  Tobacco use disorder Nicotine patch and tobacco cessation counseling  Anxiety and depression Fibromyalgia/chronic pain Chronic insomnia Continue trazodone, venlafaxine and hydralazine Had a couple ED visits in the past month for refills on medication for sleep    DVT prophylaxis: Lovenox  Consults: cardiology  Advance Care Planning:   Code Status: Prior   Family Communication: none  Disposition Plan: Back to previous home environment  Severity of Illness: The appropriate patient status for this patient is OBSERVATION. Observation status is judged to be reasonable and necessary in order to provide the required intensity of service to ensure the patient's safety. The patient's presenting symptoms, physical exam findings, and initial radiographic and laboratory data in the context of their medical condition is felt to place them at decreased risk for further clinical deterioration. Furthermore, it is anticipated that the patient will be medically stable for discharge from the hospital  within 2 midnights of admission.   Author: Andris Baumann, MD 08/05/2023 8:25 PM  For on call review [email or web address].

## 2023-08-05 NOTE — ED Notes (Signed)
Pt called RN over call light and requested potassium IV be stopped. RN removed potassium form pt. Pt also refused second bag of IV potassium. MD aware.

## 2023-08-05 NOTE — Assessment & Plan Note (Signed)
UDS positive for opiates and cannabinoids.  No cocaine

## 2023-08-05 NOTE — ED Notes (Signed)
Pt left for CT.

## 2023-08-05 NOTE — ED Notes (Signed)
Pt returned from CT °

## 2023-08-05 NOTE — Assessment & Plan Note (Addendum)
History of embolic strokes secondary to carotid artery stenosis Last seen by vascular in 2022 with normal duplex ultrasound.  "no further invasive testing or surgery at this time".  Recommendation to stop Plavix and begin aspirin with rescan in a year -We will start aspirin and statin -Carotid Doppler -Tobacco cessation counseling

## 2023-08-05 NOTE — Assessment & Plan Note (Addendum)
Troponins 4-->5 and EKG showing possible ST depression though stable from the day prior UDS negative for cocaine (history of prior use several years prior close) BP was elevated to 151/131 on arrival which could be related to pain Pain relief with Dilaudid x 2 in the ED Nitroglycerin sublingual as needed chest pain Aspirin and statin Echocardiogram in the a.m. Cardiology consult

## 2023-08-05 NOTE — ED Provider Notes (Signed)
[facility] Provider Note    Event Date/Time   First MD Initiated Contact with Patient 08/05/23 1801     (approximate)   History   Chest Pain   HPI  Latasha Lawrence is a 52 y.o. female with history of crack cocaine abuse and anxiety comes in with chest pain.  Patient was seen yesterday for the chest pain had EKG and cardiac markers were reassuring but came back due to worsening pain.  Patient reports pain for the past 3 days.  Contrary to yesterday's note she states that she has not used any crack or cocaine.  She reports that this was years ago that she used.  She reports a little bit of pain with breathing but no overt shortness of breath.  Denies any fevers.  Denies any abdominal pain.  Denies any falls and hitting her head.    Physical Exam   Triage Vital Signs: ED Triage Vitals  Encounter Vitals Group     BP 08/05/23 1728 (!) 158/131     Systolic BP Percentile --      Diastolic BP Percentile --      Pulse Rate 08/05/23 1728 99     Resp 08/05/23 1728 (!) 22     Temp 08/05/23 1728 99.1 F (37.3 C)     Temp src --      SpO2 08/05/23 1728 94 %     Weight --      Height --      Head Circumference --      Peak Flow --      Pain Score 08/05/23 1725 10     Pain Loc --      Pain Education --      Exclude from Growth Chart --     Most recent vital signs: Vitals:   08/05/23 1728  BP: (!) 158/131  Pulse: 99  Resp: (!) 22  Temp: 99.1 F (37.3 C)  SpO2: 94%     General: Awake, no distress.  CV:  Good peripheral perfusion.  No chest wall tenderness.  No rash noted Resp:  Normal effort.  Abd:  No distention.  Soft and nontender Other:  No swelling in legs.  No calf tenderness   ED Results / Procedures / Treatments   Labs (all labs ordered are listed, but only abnormal results are displayed) Labs Reviewed  BASIC METABOLIC PANEL - Abnormal; Notable for the following components:      Result Value   Potassium 2.8 (*)    Glucose, Bld 146  (*)    All other components within normal limits  CBC - Abnormal; Notable for the following components:   WBC 16.9 (*)    RBC 5.69 (*)    Hemoglobin 16.7 (*)    HCT 48.6 (*)    All other components within normal limits  RESP PANEL BY RT-PCR (RSV, FLU A&B, COVID)  [id number]  TROPONIN I (HIGH SENSITIVITY)     EKG  My interpretation of EKG:  Sinus tachycardia rate of 106 without any ST elevation but diffuse ST depressions without any T wave inversions, normal intervals  RADIOLOGY I have reviewed the xray personally and interpreted and no evidence of any pneumonia  PROCEDURES:  Critical Care performed: No  .1-3 Lead EKG Interpretation  Performed by: Concha Se, MD Authorized by: Concha Se, MD     Interpretation: normal     ECG rate:  90   ECG rate assessment: normal  Rhythm: sinus rhythm     Ectopy: none     Conduction: normal      MEDICATIONS ORDERED IN ED: Medications  potassium chloride 10 mEq in 100 mL IVPB (10 mEq Intravenous New Bag/Given 08/05/23 1907)  HYDROmorphone (DILAUDID) injection 0.5 mg (has no administration in time range)  potassium chloride SA (KLOR-CON M) CR tablet 40 mEq (40 mEq Oral Given 08/05/23 1906)  HYDROmorphone (DILAUDID) injection 0.5 mg (0.5 mg Intravenous Given 08/05/23 1836)  ondansetron (ZOFRAN) injection 4 mg (4 mg Intravenous Given 08/05/23 1836)  iohexol (OMNIPAQUE) 350 MG/ML injection 75 mL (75 mLs Intravenous Contrast Given 08/05/23 1846)     IMPRESSION / MDM / ASSESSMENT AND PLAN / ED COURSE  I reviewed the triage vital signs and the nursing notes.   Patient's presentation is most consistent with acute presentation with potential threat to life or bodily function.  Patient comes in with some chest pain.  EKG shows new ST depressions that were not there yesterday.  Given the continued chest discomfort I will add on troponins, CT scan evaluate for PE, electrolyte abnormalities.  Patient given 1 dose of IV  Dilaudid   Troponin was negative.  BMP shows potassium of 2.8 we will give some IV oral repletion.  CBC shows elevated white count of 16.9  Given the hypokalemia this could be causing the ST depressions.  I will discuss possible team for admission for correction of her potassium and cardiac workup.  The patient is on the cardiac monitor to evaluate for evidence of arrhythmia and/or significant heart rate changes.      FINAL CLINICAL IMPRESSION(S) / ED DIAGNOSES   Final diagnoses:  Atypical chest pain  Hypokalemia     Rx / DC Orders   ED Discharge Orders     None        Note:  This document was prepared using Dragon voice recognition software and may include unintentional dictation errors.   Concha Se, MD 08/05/23 [id number]

## 2023-08-05 NOTE — Assessment & Plan Note (Addendum)
Leukocytosis Low-grade temp of 99.1 with pulse 99 and respirations 22, WBC 17,000 No source of infection identified Will get urinalysis Hold off on antibiotics for now

## 2023-08-06 ENCOUNTER — Observation Stay (HOSPITAL_BASED_OUTPATIENT_CLINIC_OR_DEPARTMENT_OTHER)
Admit: 2023-08-06 | Discharge: 2023-08-06 | Disposition: A | Discharge status: Home / Self Care | Attending: Internal Medicine | Admitting: Internal Medicine

## 2023-08-06 ENCOUNTER — Other Ambulatory Visit: Payer: Self-pay

## 2023-08-06 DIAGNOSIS — R079 Chest pain, unspecified: Secondary | ICD-10-CM | POA: Diagnosis not present

## 2023-08-06 DIAGNOSIS — R072 Precordial pain: Secondary | ICD-10-CM | POA: Diagnosis not present

## 2023-08-06 LAB — HEPATIC FUNCTION PANEL
ALT: 17 U/L (ref 0–44)
AST: 18 U/L (ref 15–41)
Albumin: 3.9 g/dL (ref 3.5–5.0)
Alkaline Phosphatase: 57 U/L (ref 38–126)
Bilirubin, Direct: 0.1 mg/dL (ref 0.0–0.2)
Indirect Bilirubin: 0.9 mg/dL (ref 0.3–0.9)
Total Bilirubin: 1 mg/dL (ref 0.3–1.2)
Total Protein: 7 g/dL (ref 6.5–8.1)

## 2023-08-06 LAB — BASIC METABOLIC PANEL
Anion gap: 9 (ref 5–15)
BUN: 15 mg/dL (ref 6–20)
CO2: 28 mmol/L (ref 22–32)
Calcium: 9.3 mg/dL (ref 8.9–10.3)
Chloride: 100 mmol/L (ref 98–111)
Creatinine, Ser: 0.85 mg/dL (ref 0.44–1.00)
GFR, Estimated: >60 mL/min (ref >60)
Glucose, Bld: 109 mg/dL — ABNORMAL HIGH (ref 70–99)
Potassium: 3.5 mmol/L (ref 3.5–5.1)
Sodium: 137 mmol/L (ref 135–145)

## 2023-08-06 LAB — ECHOCARDIOGRAM COMPLETE
AR max vel: 2.93 cm2
AV Area VTI: 2.87 cm2
AV Area mean vel: 2.55 cm2
AV Mean grad: 5 mm[Hg]
AV Peak grad: 8.9 mm[Hg]
Ao pk vel: 1.49 m/s
Area-P 1/2: 3.12 cm2
Height: 66 in
MV VTI: 3.46 cm2
S' Lateral: 3.4 cm
Weight: 2895.96 [oz_av]

## 2023-08-06 LAB — CBC
HCT: 45 % (ref 36.0–46.0)
Hemoglobin: 15.3 g/dL — ABNORMAL HIGH (ref 12.0–15.0)
MCH: 29.4 pg (ref 26.0–34.0)
MCHC: 34 g/dL (ref 30.0–36.0)
MCV: 86.4 fL (ref 80.0–100.0)
Platelets: 300 10*3/uL (ref 150–400)
RBC: 5.21 MIL/uL — ABNORMAL HIGH (ref 3.87–5.11)
RDW: 14.6 % (ref 11.5–15.5)
WBC: 12.8 10*3/uL — ABNORMAL HIGH (ref 4.0–10.5)
nRBC: 0 % (ref 0.0–0.2)

## 2023-08-06 LAB — TROPONIN I (HIGH SENSITIVITY)
Troponin I (High Sensitivity): 6 ng/L (ref <18)
Troponin I (High Sensitivity): 4 ng/L (ref <18)

## 2023-08-06 LAB — LIPASE, BLOOD: Lipase: 54 U/L — ABNORMAL HIGH (ref 11–51)

## 2023-08-06 LAB — HIV ANTIBODY (ROUTINE TESTING W REFLEX): HIV Screen 4th Generation wRfx: NONREACTIVE

## 2023-08-06 MED ORDER — MELATONIN 5 MG PO TABS
5.0000 mg | ORAL_TABLET | Freq: Every day | ORAL | Status: DC
  Administered 2023-08-06 – 2023-08-08 (×4): 5 mg via ORAL
  Filled 2023-08-06 (×4): qty 1

## 2023-08-06 MED ORDER — HYDROXYZINE HCL 25 MG PO TABS
100.0000 mg | ORAL_TABLET | Freq: Every evening | ORAL | Status: DC | PRN
  Administered 2023-08-06 – 2023-08-08 (×4): 100 mg via ORAL
  Filled 2023-08-06 (×4): qty 4

## 2023-08-06 MED ORDER — VENLAFAXINE HCL ER 75 MG PO CP24
75.0000 mg | ORAL_CAPSULE | Freq: Every day | ORAL | Status: DC
  Administered 2023-08-06 – 2023-08-09 (×4): 75 mg via ORAL
  Filled 2023-08-06 (×4): qty 1

## 2023-08-06 MED ORDER — KETOROLAC TROMETHAMINE 30 MG/ML IJ SOLN
30.0000 mg | Freq: Four times a day (QID) | INTRAMUSCULAR | Status: DC | PRN
  Administered 2023-08-06 – 2023-08-07 (×5): 30 mg via INTRAVENOUS
  Filled 2023-08-06 (×4): qty 1

## 2023-08-06 MED ORDER — KETOROLAC TROMETHAMINE 30 MG/ML IJ SOLN
INTRAMUSCULAR | Status: AC
  Administered 2023-08-06: 30 mg via INTRAVENOUS
  Filled 2023-08-06: qty 1

## 2023-08-06 MED ORDER — HYDROMORPHONE HCL 1 MG/ML IJ SOLN
0.5000 mg | Freq: Four times a day (QID) | INTRAMUSCULAR | Status: AC | PRN
  Administered 2023-08-06 – 2023-08-07 (×4): 0.5 mg via INTRAVENOUS
  Filled 2023-08-06 (×4): qty 0.5

## 2023-08-06 MED ORDER — FLUTICASONE PROPIONATE 50 MCG/ACT NA SUSP
1.0000 | Freq: Every day | NASAL | Status: DC
  Administered 2023-08-06 – 2023-08-09 (×4): 1 via NASAL
  Filled 2023-08-06: qty 16

## 2023-08-06 MED ORDER — ALPRAZOLAM 0.25 MG PO TABS
0.2500 mg | ORAL_TABLET | Freq: Two times a day (BID) | ORAL | Status: AC | PRN
Start: 2023-08-06 — End: 2023-08-06
  Administered 2023-08-06 (×2): 0.25 mg via ORAL
  Filled 2023-08-06 (×2): qty 1

## 2023-08-06 NOTE — Progress Notes (Signed)
Patient states she has had no chest pain relief since being in the hospital. During night shift, she was given nitroglycerin x2 until developing a headache and IV toradol with no relief. EKG was obtained as well.

## 2023-08-06 NOTE — Progress Notes (Addendum)
Progress Note    ZAI LYNDON  WUJ:[id number] DOB: 1970/12/13  DOA: 08/05/2023 PCP: Patient, No Pcp Per      Brief Narrative:    Medical records reviewed and are as summarized below:  BREAJAH STEINHARDT is a 52 y.o. 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  Patient was seen in the ED the day prior with chest pain and had a reassuring workup and was discharged.  She returns a day later with continued left-sided chest pain now associated with nausea and vomiting.       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    Left-sided chest pain: Serial troponins negative.  EKG reviewed by me showed normal sinus rhythm without acute ST-T changes.  2D echo showed normal EF estimated at 55 to 60%, grade 1 diastolic dysfunction. Carotid duplex ultrasound 1 to 49% stenosis bilateral internal carotid arteries Hemoglobin A1c was 5.7 on 12/05/2022 Liver enzymes are normal and lipase is unremarkable at 54 (only slightly elevated) Continue aspirin.  Analgesics as needed for pain. Her risk factors for  cardiovascular disease include hypertension, hyperlipidemia, tobacco use disorder, history of stroke, family history of myocardial infarction in her dad in his 7s.  We discussed further workup with exercise nuclear stress test.  Patient is agreeable to do this test.  I was contacted by the nuclear medicine department that stress test cannot be done until tomorrow.   Anxiety, insomnia: It is not clear if her symptoms are related to anxiety.  Her main concern has been difficulty sleeping for about 2 to 3 months.  She said inadequate sleep for the past few months is making her feel very bad.  She said Xanax has helped her in the past and requested only Xanax to help f her with anxiety. Continue venlafaxine, trazodone and Atarax as needed   Leukocytosis: Improved.  This is probably reactive.   Hypokalemia: Improved.  Continue potassium repletion.   Hypertension: Continue hydrochlorothiazide, losartan and spironolactone   Hyperlipidemia: Continue Lipitor   Tobacco use disorder, history of substance use disorder: Advised to stop smoking cigarettes. Urine drug screen positive for cannabinoids and opiate     Diet Order             Diet NPO time specified  Diet effective midnight           Diet Heart Room service appropriate? Yes; Fluid consistency: Thin  Diet effective now                            Consultants: None  Procedures: None    Medications:    aspirin  324 mg Oral NOW   Or   aspirin  300 mg Rectal NOW   aspirin EC  81 mg Oral Daily   atorvastatin  80 mg Oral q1800   enoxaparin (LOVENOX) injection  40 mg Subcutaneous Q24H   fluticasone  1 spray Each Nare Daily   hydrochlorothiazide  25 mg Oral Daily   losartan  50 mg Oral Daily   melatonin  5 mg Oral QHS   nicotine  21 mg Transdermal Daily   pantoprazole  40 mg Oral Daily   spironolactone  25 mg Oral Daily   traZODone  200 mg Oral QHS   venlafaxine XR  150 mg Oral Q breakfast   venlafaxine XR   75 mg Oral Daily   Continuous Infusions:   Anti-infectives (From admission, onward)    None              Family Communication/Anticipated D/C date and plan/Code Status   DVT prophylaxis: enoxaparin (LOVENOX) injection 40 mg Start: 08/05/23 2200     Code Status: Full Code  Family Communication: None Disposition Plan: Plan to discharge home tomorrow   Status is: Observation The patient will require care spanning > 2 midnights and should be moved to inpatient because: Ongoing workup for chest pain       Subjective:   Interval events noted.  She complains of left-sided chest pain graded as 7/10 in severity requested something for pain.  She describes pain as a pressure sensation in the chest.  Pain is not really related to exertion but she thinks that sometimes when she sits up the pain gets better.  She also complains of shortness of breath with exertion.  No nausea or vomiting today.  No palpitations.  She is anxious but she said anxiety typically does not give her chest pain. She also complains of hunger.  She said she had not eaten anything for about 2 days.  She is hoping stress test will be done tomorrow so she could eat.  Objective:    Vitals:   08/06/23 0523 08/06/23 0849 08/06/23 1138 08/06/23 1500  BP:  (!) 150/81 (!) 153/78 (!) 146/71  Pulse:  75 88 83  Resp:  18 18 18   Temp:  98.4 F (36.9 C) 99 F (37.2 C) 98.7 F (37.1 C)  TempSrc:  Oral Oral Oral  SpO2:  99% 99% 98%  Weight: 82.1 kg     Height: 5\' 6"  (1.676 m)      No data found.   Intake/Output Summary (Last 24 hours) at 08/06/2023 1511 Last data filed at 08/06/2023 1024 Gross per 24 hour  Intake 330.51 ml  Output --  Net 330.51 ml   Filed Weights   08/06/23 0523  Weight: 82.1 kg    Exam:  GEN: NAD SKIN: Warm and dry EYES: No pallor or icterus ENT: MMM CV: RRR PULM: CTA B ABD: soft, ND, NT, +BS CNS: AAO x 3, non focal EXT: No edema or tenderness PSYCH: Anxious but  cooperative MSK: No chest wall tenderness       Data Reviewed:   I have personally reviewed following labs and imaging studies:  Labs: Labs show the following:   Basic Metabolic Panel: Recent Labs  Lab 08/04/23 1000 08/05/23 1730 08/05/23 1822 08/06/23 0352  NA 135 136  --  137  K 3.3* 2.8*  --  3.5  CL 97* 99  --  100  CO2 25 26  --  28  GLUCOSE 127* 146*  --  109*  BUN 17 14  --  15  CREATININE 0.76 0.87  --  0.85  CALCIUM 9.9 10.0  --  9.3  MG  --   --  1.9  --    GFR Estimated Creatinine Clearance: 85.5 mL/min (by C-G formula based on SCr of 0.85 mg/dL). Liver Function Tests: Recent Labs  Lab 08/06/23 0620  AST 18  ALT 17  ALKPHOS 57  BILITOT 1.0  PROT 7.0  ALBUMIN 3.9   Recent Labs  Lab 08/06/23 0620  LIPASE 54*   No results for input(s): "AMMONIA" in the last 168 hours. Coagulation profile No results for input(s): "INR", "PROTIME" in the last 168 hours.  CBC: Recent Labs  Lab 08/04/23 1000 08/05/23 1730 08/06/23 0352  WBC 12.1* 16.9* 12.8*  HGB 16.7* 16.7* 15.3*  HCT 50.2* 48.6* 45.0  MCV 88.1 85.4 86.4  PLT 325 370 300   Cardiac Enzymes: No results for input(s): "CKTOTAL", "CKMB", "CKMBINDEX", "TROPONINI" in the last 168 hours. BNP (last 3 results) No results for input(s): "PROBNP" in the last 8760 hours. CBG: No results for input(s): "GLUCAP" in the last 168 hours. D-Dimer: No results for input(s): "DDIMER" in the last 72 hours. Hgb A1c: No results for input(s): "HGBA1C" in the last 72 hours. Lipid Profile: No results for input(s): "CHOL", "HDL", "LDLCALC", "TRIG", "CHOLHDL", "LDLDIRECT" in the last 72 hours. Thyroid function studies: No results for input(s): "TSH", "T4TOTAL", "T3FREE", "THYROIDAB" in the last 72 hours.  Invalid input(s): "FREET3" Anemia work up: No results for input(s): "VITAMINB12", "FOLATE", "FERRITIN", "TIBC", "IRON", "RETICCTPCT" in the last 72 hours. Sepsis Labs: Recent Labs  Lab 08/04/23 1000  08/05/23 1730 08/06/23 0352  WBC 12.1* 16.9* 12.8*    Microbiology Recent Results (from the past 240 hour(s))  Resp panel by RT-PCR (RSV, Flu A&B, Covid) Anterior Nasal Swab     Status: None   Collection Time: 08/05/23  5:30 PM   Specimen: Anterior Nasal Swab  Result Value Ref Range Status   SARS Coronavirus 2 by RT PCR NEGATIVE NEGATIVE Final    Comment: (NOTE) SARS-CoV-2 target nucleic acids are NOT DETECTED.  The SARS-CoV-2 RNA is generally detectable in upper respiratory specimens during the acute phase of infection. The lowest concentration of SARS-CoV-2 viral copies this assay can detect is 138 copies/mL. A negative result does not preclude SARS-Cov-2 infection and should not be used as the sole basis for treatment or other patient management decisions. A negative result may occur with  improper specimen collection/handling, submission of specimen other than nasopharyngeal swab, presence of viral mutation(s) within the areas targeted by this assay, and inadequate number of viral copies(<138 copies/mL). A negative result must be combined with clinical observations, patient history, and epidemiological information. The expected result is Negative.  Fact Sheet for Patients:  BloggerCourse.com  Fact Sheet for Healthcare Providers:  SeriousBroker.it  This test is no t yet approved or cleared by the [place] FDA and  has been authorized for detection and/or diagnosis of SARS-CoV-2 by FDA under an Emergency Use Authorization (EUA). This EUA will remain  in effect (meaning this test can be used) for the duration of the COVID-19 declaration under Section 564(b)(1) of the Act, 21 U.S.C.section 360bbb-3(b)(1), unless the authorization is terminated  or revoked sooner.       Influenza A by PCR NEGATIVE NEGATIVE Final   Influenza B by PCR NEGATIVE NEGATIVE Final    Comment: (NOTE) The Xpert Xpress SARS-CoV-2/FLU/RSV plus  assay is intended as an aid in  the diagnosis of influenza from Nasopharyngeal swab specimens and should not be used as a sole basis for treatment. Nasal washings and aspirates are unacceptable for Xpert Xpress SARS-CoV-2/FLU/RSV testing.  Fact Sheet for Patients: BloggerCourse.com  Fact Sheet for Healthcare Providers: SeriousBroker.it  This test is not yet approved or cleared by the [place] FDA and has been authorized for detection and/or diagnosis of SARS-CoV-2 by FDA under an Emergency Use Authorization (EUA). This EUA will remain in effect (meaning this test can be used) for the duration of the COVID-19 declaration under Section 564(b)(1) of the Act, 21 U.S.C. section 360bbb-3(b)(1), unless the authorization is terminated or revoked.     Resp Syncytial Virus by PCR NEGATIVE NEGATIVE Final    Comment: (NOTE) Fact Sheet for Patients: BloggerCourse.com  Fact Sheet for Healthcare Providers: SeriousBroker.it  This test is not yet approved or cleared by the [place] FDA and has been authorized for detection and/or diagnosis of SARS-CoV-2 by FDA under an Emergency Use Authorization (EUA). This EUA will remain in effect (meaning this test can be used) for the duration of the COVID-19 declaration under Section 564(b)(1) of the Act, 21 U.S.C. section 360bbb-3(b)(1), unless the authorization is terminated or revoked.  Performed at [facility], [address] [place], [place] [address]     Procedures and diagnostic studies:  ECHOCARDIOGRAM COMPLETE  Result Date: 08/06/2023    ECHOCARDIOGRAM REPORT   Patient Name:   Latasha Lawrence Date of Exam: 08/06/2023 Medical Rec #:  [id number]     Height:       66.0 in Accession #:    [id number]    Weight:       181.0 lb Date of Birth:  April 18, 1971    BSA:          1.917 m Patient Age:    51 years      BP:           113/66  mmHg Patient Gender: F             HR:           72 bpm. Exam Location:  [facility] Procedure: 2D Echo, Cardiac Doppler, Color Doppler and Strain Analysis Indications:     Chest Pain  History:         Patient has prior history of Echocardiogram examinations, most                  recent 04/20/2017. Stroke, Signs/Symptoms:Chest Pain; Risk                  Factors:Hypertension, Dyslipidemia and Current Smoker.                  Polysubstance abuse.  Sonographer:     Mikki Harbor Referring Phys:  [id number] Andris Baumann Diagnosing Phys: Wilfred Lacy  Sonographer Comments: Global longitudinal strain was attempted. IMPRESSIONS  1. Left ventricular ejection fraction, by estimation, is 55 to 60%. The left ventricle has normal function. The left ventricle has no regional wall motion abnormalities. Left ventricular diastolic parameters are consistent with Grade I diastolic dysfunction (impaired relaxation).  2. Right ventricular systolic function is normal. The right ventricular size is normal. Tricuspid regurgitation signal is inadequate for assessing PA pressure.  3. The mitral valve is normal in structure. No evidence of mitral valve regurgitation. No evidence of mitral stenosis.  4. The aortic valve is tricuspid. Aortic valve regurgitation is not visualized. No aortic stenosis is present.  5. The inferior  vena cava is normal in size with greater than 50% respiratory variability, suggesting right atrial pressure of 3 mmHg. FINDINGS  Left Ventricle: Left ventricular ejection fraction, by estimation, is 55 to 60%. The left ventricle has normal function. The left ventricle has no regional wall motion abnormalities. The left ventricular internal cavity size was normal in size. There is  no left ventricular hypertrophy. Left ventricular diastolic parameters are consistent with Grade I diastolic dysfunction (impaired relaxation). Right Ventricle: The right ventricular size is normal. No increase in right ventricular wall  thickness. Right ventricular systolic function is normal. Tricuspid regurgitation signal is inadequate for assessing PA pressure. Left Atrium: Left atrial size was normal in size. Right Atrium: Right atrial size was normal in size. Pericardium: There is no evidence of pericardial effusion. Mitral Valve: The mitral valve is normal in structure. No evidence of mitral valve regurgitation. No evidence of mitral valve stenosis. MV peak gradient, 2.7 mmHg. The mean mitral valve gradient is 1.0 mmHg. Tricuspid Valve: The tricuspid valve is normal in structure. Tricuspid valve regurgitation is not demonstrated. Aortic Valve: The aortic valve is tricuspid. Aortic valve regurgitation is not visualized. No aortic stenosis is present. Aortic valve mean gradient measures 5.0 mmHg. Aortic valve peak gradient measures 8.9 mmHg. Aortic valve area, by VTI measures 2.87 cm. Pulmonic Valve: The pulmonic valve was normal in structure. Pulmonic valve regurgitation is not visualized. Aorta: The aortic root is normal in size and structure. Venous: The inferior vena cava is normal in size with greater than 50% respiratory variability, suggesting right atrial pressure of 3 mmHg. IAS/Shunts: No atrial level shunt detected by color flow Doppler.  LEFT VENTRICLE PLAX 2D LVIDd:         4.70 cm   Diastology LVIDs:         3.40 cm   LV e' medial:    8.49 cm/s LV PW:         0.90 cm   LV E/e' medial:  8.4 LV IVS:        1.10 cm   LV e' lateral:   9.25 cm/s LVOT diam:     2.00 cm   LV E/e' lateral: 7.8 LV SV:         82 LV SV Index:   43 LVOT Area:     3.14 cm  RIGHT VENTRICLE RV Basal diam:  3.35 cm RV Mid diam:    3.80 cm RV S prime:     16.20 cm/s TAPSE (M-mode): 2.3 cm LEFT ATRIUM             Index        RIGHT ATRIUM           Index LA diam:        3.00 cm 1.57 cm/m   RA Area:     15.40 cm LA Vol (A2C):   46.8 ml 24.42 ml/m  RA Volume:   40.00 ml  20.87 ml/m LA Vol (A4C):   33.1 ml 17.27 ml/m LA Biplane Vol: 39.6 ml 20.66 ml/m  AORTIC  VALVE                     PULMONIC VALVE AV Area (Vmax):    2.93 cm      PV Vmax:       1.04 m/s AV Area (Vmean):   2.55 cm      PV Peak grad:  4.3 mmHg AV Area (VTI):     2.87 cm AV Vmax:  149.00 cm/s AV Vmean:          100.000 cm/s AV VTI:            0.285 m AV Peak Grad:      8.9 mmHg AV Mean Grad:      5.0 mmHg LVOT Vmax:         139.00 cm/s LVOT Vmean:        81.200 cm/s LVOT VTI:          0.260 m LVOT/AV VTI ratio: 0.91  AORTA Ao Root diam: 3.50 cm MITRAL VALVE MV Area (PHT): 3.12 cm    SHUNTS MV Area VTI:   3.46 cm    Systemic VTI:  0.26 m MV Peak grad:  2.7 mmHg    Systemic Diam: 2.00 cm MV Mean grad:  1.0 mmHg MV Vmax:       0.82 m/s MV Vmean:      53.0 cm/s MV Decel Time: 243 msec MV E velocity: 71.70 cm/s MV A velocity: 73.90 cm/s MV E/A ratio:  0.97 Dalton McleanMD Electronically signed by Wilfred Lacy Signature Date/Time: 08/06/2023/12:07:42 PM    Final    US Carotid Bilateral  Result Date: 08/05/2023 CLINICAL DATA:  Hypertension, stroke, left carotid stent, hyperlipidemia, tobacco abuse EXAM: BILATERAL CAROTID DUPLEX ULTRASOUND TECHNIQUE: Wallace Cullens scale imaging, color Doppler and duplex ultrasound were performed of bilateral carotid and vertebral arteries in the neck. COMPARISON:  08/05/2023, 04/18/2017 FINDINGS: Criteria: Quantification of carotid stenosis is based on velocity parameters that correlate the residual internal carotid diameter with NASCET-based stenosis levels, using the diameter of the distal internal carotid lumen as the denominator for stenosis measurement. The following velocity measurements were obtained: RIGHT ICA: 75/28 cm/sec CCA: 86/22 cm/sec SYSTOLIC ICA/CCA RATIO:  0.9 ECA: 99 cm/sec LEFT ICA: 77/35 cm/sec CCA: 83/21 cm/sec SYSTOLIC ICA/CCA RATIO:  0.9 ECA: 55 cm/sec RIGHT CAROTID ARTERY: Mild atheromatous plaque at the carotid bifurcation. RIGHT VERTEBRAL ARTERY:  Antegrade LEFT CAROTID ARTERY: Mild atheromatous plaque at the carotid bifurcation. LEFT  VERTEBRAL ARTERY:  Antegrade IMPRESSION: 1. Estimated 0-49% stenosis within the internal carotid arteries bilaterally. Electronically Signed   By: Sharlet Salina M.D.   On: 08/05/2023 21:47   CT Angio Chest PE W and/or Wo Contrast  Result Date: 08/05/2023 CLINICAL DATA:  Chest pain and left shoulder pain for 3 days EXAM: CT ANGIOGRAPHY CHEST WITH CONTRAST TECHNIQUE: Multidetector CT imaging of the chest was performed using the standard protocol during bolus administration of intravenous contrast. Multiplanar CT image reconstructions and MIPs were obtained to evaluate the vascular anatomy. RADIATION DOSE REDUCTION: This exam was performed according to the departmental dose-optimization program which includes automated exposure control, adjustment of the mA and/or kV according to patient size and/or use of iterative reconstruction technique. CONTRAST:  75mL OMNIPAQUE IOHEXOL 350 MG/ML SOLN COMPARISON:  Radiograph 08/05/2023 and CTA chest 11/04/2022 FINDINGS: Cardiovascular: Negative for acute pulmonary embolism. Normal caliber thoracic aorta. No pericardial effusion. Mediastinum/Nodes: Trachea and esophagus are unremarkable. No thoracic adenopathy Lungs/Pleura: No focal consolidation, pleural effusion, or pneumothorax. Upper Abdomen: No acute abnormality. Musculoskeletal: No acute fracture. Review of the MIP images confirms the above findings. IMPRESSION: Negative for acute pulmonary embolism. No acute abnormality in the chest. Electronically Signed   By: Minerva Fester M.D.   On: 08/05/2023 19:17   DG Chest 2 View  Result Date: 08/05/2023 CLINICAL DATA:  chest pain EXAM: CHEST - 2 VIEW COMPARISON:  August 04, 2023 FINDINGS: The cardiomediastinal silhouette is unchanged in contour. No pleural effusion.  No pneumothorax. No acute pleuroparenchymal abnormality. Visualized abdomen is unremarkable. Mild degenerative changes of the thoracolumbar junction. IMPRESSION: No acute cardiopulmonary abnormality.  Electronically Signed   By: Meda Klinefelter M.D.   On: 08/05/2023 17:52               LOS: 0 days   Rhythm Gubbels  [facility]   Pager on [email or web address]. If 7PM-7AM, please contact night-coverage at [email or web address]     08/06/2023, 3:11 PM

## 2023-08-06 NOTE — Progress Notes (Signed)
Transition of Care [facility]) - Inpatient Brief Assessment   Patient Details  Name: Latasha Lawrence MRN: [id number] Date of Birth: 16-Oct-1970  Transition of [facility]) CM/SW Contact:    Truddie Hidden, RN Phone Number: 08/06/2023, 1:59 PM   Clinical Narrative: TOC continuing to follow patient's progress throughout discharge planning.   Transition of Care Asessment: Insurance and Status: Insurance coverage has been reviewed Patient has primary care physician: Yes Home environment has been reviewed: home Prior level of function:: independent Prior/Current Home Services: No current home services Social Determinants of Health Reivew: SDOH reviewed no interventions necessary Readmission risk has been reviewed: Yes Transition of care needs: no transition of care needs at this time

## 2023-08-06 NOTE — Plan of Care (Signed)
  Problem: Education: Goal: Understanding of cardiac disease, CV risk reduction, and recovery process will improve Outcome: Progressing   Problem: Activity: Goal: Ability to tolerate increased activity will improve Outcome: Progressing   Problem: Cardiac: Goal: Ability to achieve and maintain adequate cardiovascular perfusion will improve Outcome: Progressing   Problem: Education: Goal: Knowledge of General Education information will improve Description: Including pain rating scale, medication(s)/side effects and non-pharmacologic comfort measures Outcome: Progressing   Problem: Clinical Measurements: Goal: Will remain free from infection Outcome: Progressing

## 2023-08-06 NOTE — Progress Notes (Signed)
Pt complained of left sided chest pain radiating to the back. Pain scale 7/10. Place pt on oxygen 4 lpm nasal cannula. Informed Dr. Para March via secure chat. PRN Nitroglycerin SL given. Pt refused the 3rd dose, said she's getting a headache now. 12L EKG done. Chest pain unrelieved after 2 doses of Nitroglycerin SL.  Dr. Para March made orders.

## 2023-08-06 NOTE — Progress Notes (Signed)
*  PRELIMINARY RESULTS* Echocardiogram 2D Echocardiogram has been performed.  Carolyne Fiscal 08/06/2023, 10:37 AM

## 2023-08-07 ENCOUNTER — Ambulatory Visit

## 2023-08-07 DIAGNOSIS — R072 Precordial pain: Secondary | ICD-10-CM | POA: Diagnosis not present

## 2023-08-07 LAB — LIPOPROTEIN A (LPA): Lipoprotein (a): 52.1 nmol/L — ABNORMAL HIGH (ref <75.0)

## 2023-08-07 MED ORDER — ALPRAZOLAM 0.25 MG PO TABS
0.2500 mg | ORAL_TABLET | Freq: Two times a day (BID) | ORAL | Status: AC | PRN
  Administered 2023-08-07 (×2): 0.25 mg via ORAL
  Filled 2023-08-07 (×2): qty 1

## 2023-08-07 NOTE — Progress Notes (Signed)
Progress Note    PATRESA LOFTHOUSE  WUJ:[id number] DOB: 26-Jul-1971  DOA: 08/05/2023 PCP: Patient, No Pcp Per      Brief Narrative:    Medical records reviewed and are as summarized below:  SCHYLER SHELL is a 52 y.o. 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  Patient was seen in the ED the day prior with chest pain and had a reassuring workup and was discharged.  She returns a day later with continued left-sided chest pain now associated with nausea and vomiting.       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    Left-sided chest pain: Serial troponins negative.  EKG reviewed by me showed normal sinus rhythm without acute ST-T changes.  2D echo showed normal EF estimated at 55 to 60%, grade 1 diastolic dysfunction. Carotid duplex ultrasound 1 to 49% stenosis bilateral internal carotid arteries Hemoglobin A1c was 5.7 on 12/05/2022 Liver enzymes are normal and lipase is unremarkable at 54 (only slightly elevated) Continue aspirin.  Analgesics as needed for pain. Her risk factors for cardiovascular disease include hypertension, hyperlipidemia, tobacco use  disorder, history of stroke, family history of myocardial infarction in her dad in his 45s.   Exercise nuclear stress test could not be done today because patient had a nicotine patch.  I was informed by the nuclear medicine department that stress test cannot be done tomorrow, 08/08/2023, because of staffing issues.  It may be done on Thursday, 08/09/2023. I told the patient that stress test can be done as an outpatient if she felt cold enough to go home today.  However, she said that she prefers to complete stress test as an inpatient because she does not have transportation. I asked the patient about her home situation and whether she had a place to stay.  She said that she has a place to stay but she has issues with "transportation, food and gas".   Anxiety, insomnia: It is not clear if her symptoms are related to anxiety.  Her main concern has been difficulty sleeping for about 2 to 3 months.  She said inadequate sleep for the past few months is making her feel very bad.  She said Xanax has helped her in the past and requested only Xanax to help her with anxiety. She was prescribed Xanax and she said this has helped her since admission. Continue venlafaxine, trazodone and Atarax as needed   Leukocytosis: Improved.  This is probably reactive.   Hypokalemia: Improved.  Repeat potassium tomorrow.   Hypertension: Continue hydrochlorothiazide, losartan and spironolactone   Hyperlipidemia: Continue Lipitor   Tobacco use disorder, history of substance use disorder: Advised to stop smoking cigarettes. Urine drug screen positive for cannabinoids and opiate  Diet Order             Diet Heart Room service appropriate? Yes; Fluid consistency: Thin  Diet effective now                            Consultants: None  Procedures: None    Medications:    aspirin EC  81 mg Oral Daily   atorvastatin  80 mg Oral q1800   enoxaparin (LOVENOX) injection  40 mg Subcutaneous  Q24H   fluticasone  1 spray Each Nare Daily   hydrochlorothiazide  25 mg Oral Daily   losartan  50 mg Oral Daily   melatonin  5 mg Oral QHS   nicotine  21 mg Transdermal Daily   pantoprazole  40 mg Oral Daily   spironolactone  25 mg Oral Daily   traZODone  200 mg Oral QHS   venlafaxine XR  150 mg Oral Q breakfast   venlafaxine XR  75 mg Oral Daily   Continuous Infusions:   Anti-infectives (From admission, onward)    None              Family Communication/Anticipated D/C date and plan/Code Status   DVT prophylaxis: enoxaparin (LOVENOX) injection 40 mg Start: 08/05/23 2200     Code Status: Full Code  Family Communication: None Disposition Plan: Plan to discharge home   Status is: Observation The patient will require care spanning > 2 midnights and should be moved to inpatient because: Ongoing workup for chest pain       Subjective:   Interval events noted.  She said Xanax made her feel better.  She still complains of chest pain.  She grades the chest pain as 5 or 6/10 in severity.  Objective:    Vitals:   08/07/23 0316 08/07/23 0400 08/07/23 0821 08/07/23 1231  BP: (!) 141/79 (!) 140/74 (!) 155/97 (!) 153/99  Pulse: 61 61 63 78  Resp: 20     Temp: 98.1 F (36.7 C) 98.2 F (36.8 C) (!) 96.7 F (35.9 C) 98.2 F (36.8 C)  TempSrc: Oral Oral    SpO2: 100%  98% 98%  Weight:      Height:       No data found.   Intake/Output Summary (Last 24 hours) at 08/07/2023 1512 Last data filed at 08/07/2023 1400 Gross per 24 hour  Intake 470 ml  Output --  Net 470 ml   Filed Weights   08/06/23 0523  Weight: 82.1 kg    Exam:  GEN: NAD SKIN: Warm and dry EYES: No pallor or icterus ENT: MMM CV: RRR PULM: CTA B ABD: soft, ND, NT, +BS CNS: AAO x 3, non focal EXT: No edema or tenderness        Data Reviewed:   I have personally reviewed following labs and imaging studies:  Labs: Labs show the following:   Basic Metabolic  Panel: Recent Labs  Lab 08/04/23 1000 08/05/23 1730 08/05/23 1822 08/06/23 0352  NA 135 136  --  137  K 3.3* 2.8*  --  3.5  CL 97* 99  --  100  CO2 25 26  --  28  GLUCOSE 127* 146*  --  109*  BUN 17 14  --  15  CREATININE 0.76 0.87  --  0.85  CALCIUM 9.9 10.0  --  9.3  MG  --   --  1.9  --    GFR Estimated Creatinine Clearance: 85.5  mL/min (by C-G formula based on SCr of 0.85 mg/dL). Liver Function Tests: Recent Labs  Lab 08/06/23 0620  AST 18  ALT 17  ALKPHOS 57  BILITOT 1.0  PROT 7.0  ALBUMIN 3.9   Recent Labs  Lab 08/06/23 0620  LIPASE 54*   No results for input(s): "AMMONIA" in the last 168 hours. Coagulation profile No results for input(s): "INR", "PROTIME" in the last 168 hours.  CBC: Recent Labs  Lab 08/04/23 1000 08/05/23 1730 08/06/23 0352  WBC 12.1* 16.9* 12.8*  HGB 16.7* 16.7* 15.3*  HCT 50.2* 48.6* 45.0  MCV 88.1 85.4 86.4  PLT 325 370 300   Cardiac Enzymes: No results for input(s): "CKTOTAL", "CKMB", "CKMBINDEX", "TROPONINI" in the last 168 hours. BNP (last 3 results) No results for input(s): "PROBNP" in the last 8760 hours. CBG: No results for input(s): "GLUCAP" in the last 168 hours. D-Dimer: No results for input(s): "DDIMER" in the last 72 hours. Hgb A1c: No results for input(s): "HGBA1C" in the last 72 hours. Lipid Profile: No results for input(s): "CHOL", "HDL", "LDLCALC", "TRIG", "CHOLHDL", "LDLDIRECT" in the last 72 hours. Thyroid function studies: No results for input(s): "TSH", "T4TOTAL", "T3FREE", "THYROIDAB" in the last 72 hours.  Invalid input(s): "FREET3" Anemia work up: No results for input(s): "VITAMINB12", "FOLATE", "FERRITIN", "TIBC", "IRON", "RETICCTPCT" in the last 72 hours. Sepsis Labs: Recent Labs  Lab 08/04/23 1000 08/05/23 1730 08/06/23 0352  WBC 12.1* 16.9* 12.8*    Microbiology Recent Results (from the past 240 hour(s))  Resp panel by RT-PCR (RSV, Flu A&B, Covid) Anterior Nasal Swab     Status: None    Collection Time: 08/05/23  5:30 PM   Specimen: Anterior Nasal Swab  Result Value Ref Range Status   SARS Coronavirus 2 by RT PCR NEGATIVE NEGATIVE Final    Comment: (NOTE) SARS-CoV-2 target nucleic acids are NOT DETECTED.  The SARS-CoV-2 RNA is generally detectable in upper respiratory specimens during the acute phase of infection. The lowest concentration of SARS-CoV-2 viral copies this assay can detect is 138 copies/mL. A negative result does not preclude SARS-Cov-2 infection and should not be used as the sole basis for treatment or other patient management decisions. A negative result may occur with  improper specimen collection/handling, submission of specimen other than nasopharyngeal swab, presence of viral mutation(s) within the areas targeted by this assay, and inadequate number of viral copies(<138 copies/mL). A negative result must be combined with clinical observations, patient history, and epidemiological information. The expected result is Negative.  Fact Sheet for Patients:  BloggerCourse.com  Fact Sheet for Healthcare Providers:  SeriousBroker.it  This test is no t yet approved or cleared by the [place] FDA and  has been authorized for detection and/or diagnosis of SARS-CoV-2 by FDA under an Emergency Use Authorization (EUA). This EUA will remain  in effect (meaning this test can be used) for the duration of the COVID-19 declaration under Section 564(b)(1) of the Act, 21 U.S.C.section 360bbb-3(b)(1), unless the authorization is terminated  or revoked sooner.       Influenza A by PCR NEGATIVE NEGATIVE Final   Influenza B by PCR NEGATIVE NEGATIVE Final    Comment: (NOTE) The Xpert Xpress SARS-CoV-2/FLU/RSV plus assay is intended as an aid in the diagnosis of influenza from Nasopharyngeal swab specimens and should not be used as a sole basis for treatment. Nasal washings and aspirates are unacceptable for  Xpert Xpress SARS-CoV-2/FLU/RSV testing.  Fact Sheet for Patients: BloggerCourse.com  Fact Sheet for Healthcare Providers: SeriousBroker.it  This test is not yet approved or cleared by the [place] and has been authorized for detection and/or diagnosis of SARS-CoV-2 by FDA under an Emergency Use Authorization (EUA). This EUA will remain in effect (meaning this test can be used) for the duration of the COVID-19 declaration under Section 564(b)(1) of the Act, 21 U.S.C. section 360bbb-3(b)(1), unless the authorization is terminated or revoked.     Resp Syncytial Virus by PCR NEGATIVE NEGATIVE Final    Comment: (NOTE) Fact Sheet for Patients: BloggerCourse.com  Fact Sheet for Healthcare Providers: SeriousBroker.it  This test is not yet approved or cleared by the [place] FDA and has been authorized for detection and/or diagnosis of SARS-CoV-2 by FDA under an Emergency Use Authorization (EUA). This EUA will remain in effect (meaning this test can be used) for the duration of the COVID-19 declaration under Section 564(b)(1) of the Act, 21 U.S.C. section 360bbb-3(b)(1), unless the authorization is terminated or revoked.  Performed at [facility], [address] [place], [place] [address]     Procedures and diagnostic studies:  ECHOCARDIOGRAM COMPLETE  Result Date: 08/06/2023    ECHOCARDIOGRAM REPORT   Patient Name:   ROSEANNE BIGWOOD Date of Exam: 08/06/2023 Medical Rec #:  [id number]     Height:       66.0 in Accession #:    [id number]    Weight:       181.0 lb Date of Birth:  1971-07-14    BSA:          1.917 m Patient Age:    51 years      BP:           113/66 mmHg Patient Gender: F             HR:           72 bpm. Exam Location:  [facility] Procedure: 2D Echo, Cardiac Doppler, Color Doppler and Strain Analysis Indications:     Chest Pain  History:          Patient has prior history of Echocardiogram examinations, most                  recent 04/20/2017. Stroke, Signs/Symptoms:Chest Pain; Risk                  Factors:Hypertension, Dyslipidemia and Current Smoker.                  Polysubstance abuse.  Sonographer:     Mikki Harbor Referring Phys:  [id number] Andris Baumann Diagnosing Phys: Wilfred Lacy  Sonographer Comments: Global longitudinal strain was attempted. IMPRESSIONS  1. Left ventricular ejection fraction, by estimation, is 55 to 60%. The left ventricle has normal function. The left ventricle has no regional wall motion abnormalities. Left ventricular diastolic parameters are consistent with Grade I diastolic dysfunction (impaired relaxation).  2. Right ventricular systolic function is normal. The right ventricular size is normal. Tricuspid regurgitation signal is inadequate for assessing PA pressure.  3. The mitral valve is normal in structure. No evidence of mitral valve regurgitation. No evidence of mitral stenosis.  4. The aortic valve is tricuspid. Aortic valve regurgitation is not visualized. No aortic stenosis is present.  5. The inferior vena cava is normal in size with greater than 50% respiratory variability, suggesting right atrial pressure of 3 mmHg. FINDINGS  Left Ventricle: Left ventricular ejection fraction, by estimation, is 55 to 60%. The left ventricle has normal function. The left ventricle has no  regional wall motion abnormalities. The left ventricular internal cavity size was normal in size. There is  no left ventricular hypertrophy. Left ventricular diastolic parameters are consistent with Grade I diastolic dysfunction (impaired relaxation). Right Ventricle: The right ventricular size is normal. No increase in right ventricular wall thickness. Right ventricular systolic function is normal. Tricuspid regurgitation signal is inadequate for assessing PA pressure. Left Atrium: Left atrial size was normal in size. Right Atrium: Right  atrial size was normal in size. Pericardium: There is no evidence of pericardial effusion. Mitral Valve: The mitral valve is normal in structure. No evidence of mitral valve regurgitation. No evidence of mitral valve stenosis. MV peak gradient, 2.7 mmHg. The mean mitral valve gradient is 1.0 mmHg. Tricuspid Valve: The tricuspid valve is normal in structure. Tricuspid valve regurgitation is not demonstrated. Aortic Valve: The aortic valve is tricuspid. Aortic valve regurgitation is not visualized. No aortic stenosis is present. Aortic valve mean gradient measures 5.0 mmHg. Aortic valve peak gradient measures 8.9 mmHg. Aortic valve area, by VTI measures 2.87 cm. Pulmonic Valve: The pulmonic valve was normal in structure. Pulmonic valve regurgitation is not visualized. Aorta: The aortic root is normal in size and structure. Venous: The inferior vena cava is normal in size with greater than 50% respiratory variability, suggesting right atrial pressure of 3 mmHg. IAS/Shunts: No atrial level shunt detected by color flow Doppler.  LEFT VENTRICLE PLAX 2D LVIDd:         4.70 cm   Diastology LVIDs:         3.40 cm   LV e' medial:    8.49 cm/s LV PW:         0.90 cm   LV E/e' medial:  8.4 LV IVS:        1.10 cm   LV e' lateral:   9.25 cm/s LVOT diam:     2.00 cm   LV E/e' lateral: 7.8 LV SV:         82 LV SV Index:   43 LVOT Area:     3.14 cm  RIGHT VENTRICLE RV Basal diam:  3.35 cm RV Mid diam:    3.80 cm RV S prime:     16.20 cm/s TAPSE (M-mode): 2.3 cm LEFT ATRIUM             Index        RIGHT ATRIUM           Index LA diam:        3.00 cm 1.57 cm/m   RA Area:     15.40 cm LA Vol (A2C):   46.8 ml 24.42 ml/m  RA Volume:   40.00 ml  20.87 ml/m LA Vol (A4C):   33.1 ml 17.27 ml/m LA Biplane Vol: 39.6 ml 20.66 ml/m  AORTIC VALVE                     PULMONIC VALVE AV Area (Vmax):    2.93 cm      PV Vmax:       1.04 m/s AV Area (Vmean):   2.55 cm      PV Peak grad:  4.3 mmHg AV Area (VTI):     2.87 cm AV Vmax:            149.00 cm/s AV Vmean:          100.000 cm/s AV VTI:            0.285 m AV Peak Grad:  8.9 mmHg AV Mean Grad:      5.0 mmHg LVOT Vmax:         139.00 cm/s LVOT Vmean:        81.200 cm/s LVOT VTI:          0.260 m LVOT/AV VTI ratio: 0.91  AORTA Ao Root diam: 3.50 cm MITRAL VALVE MV Area (PHT): 3.12 cm    SHUNTS MV Area VTI:   3.46 cm    Systemic VTI:  0.26 m MV Peak grad:  2.7 mmHg    Systemic Diam: 2.00 cm MV Mean grad:  1.0 mmHg MV Vmax:       0.82 m/s MV Vmean:      53.0 cm/s MV Decel Time: 243 msec MV E velocity: 71.70 cm/s MV A velocity: 73.90 cm/s MV E/A ratio:  0.97 Dalton McleanMD Electronically signed by Wilfred Lacy Signature Date/Time: 08/06/2023/12:07:42 PM    Final    US Carotid Bilateral  Result Date: 08/05/2023 CLINICAL DATA:  Hypertension, stroke, left carotid stent, hyperlipidemia, tobacco abuse EXAM: BILATERAL CAROTID DUPLEX ULTRASOUND TECHNIQUE: Wallace Cullens scale imaging, color Doppler and duplex ultrasound were performed of bilateral carotid and vertebral arteries in the neck. COMPARISON:  08/05/2023, 04/18/2017 FINDINGS: Criteria: Quantification of carotid stenosis is based on velocity parameters that correlate the residual internal carotid diameter with NASCET-based stenosis levels, using the diameter of the distal internal carotid lumen as the denominator for stenosis measurement. The following velocity measurements were obtained: RIGHT ICA: 75/28 cm/sec CCA: 86/22 cm/sec SYSTOLIC ICA/CCA RATIO:  0.9 ECA: 99 cm/sec LEFT ICA: 77/35 cm/sec CCA: 83/21 cm/sec SYSTOLIC ICA/CCA RATIO:  0.9 ECA: 55 cm/sec RIGHT CAROTID ARTERY: Mild atheromatous plaque at the carotid bifurcation. RIGHT VERTEBRAL ARTERY:  Antegrade LEFT CAROTID ARTERY: Mild atheromatous plaque at the carotid bifurcation. LEFT VERTEBRAL ARTERY:  Antegrade IMPRESSION: 1. Estimated 0-49% stenosis within the internal carotid arteries bilaterally. Electronically Signed   By: Sharlet Salina M.D.   On: 08/05/2023 21:47   CT Angio  Chest PE W and/or Wo Contrast  Result Date: 08/05/2023 CLINICAL DATA:  Chest pain and left shoulder pain for 3 days EXAM: CT ANGIOGRAPHY CHEST WITH CONTRAST TECHNIQUE: Multidetector CT imaging of the chest was performed using the standard protocol during bolus administration of intravenous contrast. Multiplanar CT image reconstructions and MIPs were obtained to evaluate the vascular anatomy. RADIATION DOSE REDUCTION: This exam was performed according to the departmental dose-optimization program which includes automated exposure control, adjustment of the mA and/or kV according to patient size and/or use of iterative reconstruction technique. CONTRAST:  75mL OMNIPAQUE IOHEXOL 350 MG/ML SOLN COMPARISON:  Radiograph 08/05/2023 and CTA chest 11/04/2022 FINDINGS: Cardiovascular: Negative for acute pulmonary embolism. Normal caliber thoracic aorta. No pericardial effusion. Mediastinum/Nodes: Trachea and esophagus are unremarkable. No thoracic adenopathy Lungs/Pleura: No focal consolidation, pleural effusion, or pneumothorax. Upper Abdomen: No acute abnormality. Musculoskeletal: No acute fracture. Review of the MIP images confirms the above findings. IMPRESSION: Negative for acute pulmonary embolism. No acute abnormality in the chest. Electronically Signed   By: Minerva Fester M.D.   On: 08/05/2023 19:17   DG Chest 2 View  Result Date: 08/05/2023 CLINICAL DATA:  chest pain EXAM: CHEST - 2 VIEW COMPARISON:  August 04, 2023 FINDINGS: The cardiomediastinal silhouette is unchanged in contour. No pleural effusion. No pneumothorax. No acute pleuroparenchymal abnormality. Visualized abdomen is unremarkable. Mild degenerative changes of the thoracolumbar junction. IMPRESSION: No acute cardiopulmonary abnormality. Electronically Signed   By: Meda Klinefelter M.D.   On: 08/05/2023 17:52  LOS: 0 days   Vernette Moise  [facility]   Pager on [email or web address]. If 7PM-7AM, please contact  night-coverage at [email or web address]     08/07/2023, 3:12 PM

## 2023-08-07 NOTE — Plan of Care (Signed)

## 2023-08-07 NOTE — Plan of Care (Signed)
  Problem: Activity: Goal: Ability to tolerate increased activity will improve Outcome: Progressing   Problem: Cardiac: Goal: Ability to achieve and maintain adequate cardiovascular perfusion will improve Outcome: Progressing   Problem: Health Behavior/Discharge Planning: Goal: Ability to safely manage health-related needs after discharge will improve Outcome: Progressing   Problem: Education: Goal: Knowledge of General Education information will improve Description: Including pain rating scale, medication(s)/side effects and non-pharmacologic comfort measures Outcome: Progressing

## 2023-08-08 DIAGNOSIS — R072 Precordial pain: Secondary | ICD-10-CM | POA: Diagnosis not present

## 2023-08-08 MED ORDER — CYCLOBENZAPRINE HCL 10 MG PO TABS: 5.0000 mg | ORAL_TABLET | Freq: Once | ORAL | Status: DC | PRN

## 2023-08-08 MED ORDER — ALPRAZOLAM 0.25 MG PO TABS: 0.2500 mg | ORAL_TABLET | Freq: Once | ORAL | Status: DC | PRN

## 2023-08-08 MED ORDER — ALPRAZOLAM 0.25 MG PO TABS: 0.2500 mg | ORAL_TABLET | Freq: Two times a day (BID) | ORAL | Status: DC | PRN

## 2023-08-08 MED ORDER — FENTANYL CITRATE PF 50 MCG/ML IJ SOSY
12.5000 ug | PREFILLED_SYRINGE | INTRAMUSCULAR | Status: DC | PRN
  Administered 2023-08-08 – 2023-08-09 (×3): 12.5 ug via INTRAVENOUS
  Filled 2023-08-08 (×3): qty 1

## 2023-08-08 MED ORDER — PROMETHAZINE HCL 25 MG PO TABS
12.5000 mg | ORAL_TABLET | Freq: Once | ORAL | Status: AC
  Administered 2023-08-08: 12.5 mg via ORAL
  Filled 2023-08-08: qty 1

## 2023-08-08 MED ORDER — TRAMADOL HCL 50 MG PO TABS
50.0000 mg | ORAL_TABLET | Freq: Three times a day (TID) | ORAL | Status: DC | PRN
  Administered 2023-08-08 – 2023-08-09 (×3): 50 mg via ORAL
  Filled 2023-08-08 (×3): qty 1

## 2023-08-08 MED ORDER — CYCLOBENZAPRINE HCL 10 MG PO TABS
5.0000 mg | ORAL_TABLET | Freq: Once | ORAL | Status: AC | PRN
  Administered 2023-08-08: 5 mg via ORAL
  Filled 2023-08-08: qty 1

## 2023-08-08 MED ORDER — HYDRALAZINE HCL 20 MG/ML IJ SOLN
10.0000 mg | Freq: Four times a day (QID) | INTRAMUSCULAR | Status: DC | PRN
  Administered 2023-08-08: 10 mg via INTRAVENOUS
  Filled 2023-08-08: qty 1

## 2023-08-08 MED ORDER — ALPRAZOLAM 0.25 MG PO TABS
0.2500 mg | ORAL_TABLET | Freq: Two times a day (BID) | ORAL | Status: DC | PRN
  Administered 2023-08-08: 0.25 mg via ORAL
  Filled 2023-08-08: qty 1

## 2023-08-08 MED ORDER — METOCLOPRAMIDE HCL 5 MG/5ML PO SOLN
10.0000 mg | Freq: Three times a day (TID) | ORAL | Status: DC | PRN
  Administered 2023-08-08: 10 mg via ORAL
  Filled 2023-08-08 (×3): qty 10

## 2023-08-08 NOTE — TOC Progression Note (Signed)
Transition of Care [facility]) - Progression Note    Patient Details  Name: Latasha Lawrence MRN: [id number] Date of Birth: 1971/06/02  [facility] CM/SW Contact  Truddie Hidden, RN Phone Number: 08/08/2023, 1:40 PM  Clinical Narrative:    Spoke with patient at bedside regarding discharge plans. She will not have transportation home. She will need a cab at discharge.         Expected Discharge Plan and Services                                               Social Determinants of Health (SDOH) Interventions SDOH Screenings   Food Insecurity: Food Insecurity Present (08/06/2023)  Housing: Low Risk  (08/06/2023)  Transportation Needs: No Transportation Needs (08/06/2023)  Utilities: Not At Risk (08/06/2023)  Depression (PHQ2-9): High Risk (01/09/2023)  Tobacco Use: High Risk (08/05/2023)    Readmission Risk Interventions     No data to display

## 2023-08-08 NOTE — Progress Notes (Signed)
[facility]  - [place] [facility]   PATIENT NAME: Latasha Lawrence    MR#:  [id number]  DATE OF BIRTH:  September 21, 1971  SUBJECTIVE:   Laying in bed complaints of nausea. No vomiting per RN. Patient asking for Phenergan. She also has been asking for Xanax. Does not take either of meds at home. Noncompliant for follow-up   VITALS:  Blood pressure (!) 198/101, pulse 65, temperature 98.3 F (36.8 C), temperature source Oral, resp. rate 20, height 5\' 6"  (1.676 m), weight 82.1 kg, last menstrual period 11/28/2021, SpO2 99%.  PHYSICAL EXAMINATION:   GENERAL:  52 y.o.-year-old patient with no acute distress.  LUNGS: Normal breath sounds bilaterally, no wheezing CARDIOVASCULAR: S1, S2 normal. No murmur   ABDOMEN: Soft, nontender, nondistended. Bowel sounds present.  EXTREMITIES: No  edema b/l.    NEUROLOGIC: nonfocal  patient is alert and awake  LABORATORY PANEL:  CBC Recent Labs  Lab 08/06/23 0352  WBC 12.8*  HGB 15.3*  HCT 45.0  PLT 300    Chemistries  Recent Labs  Lab 08/05/23 1822 08/06/23 0352 08/06/23 0620  NA  --  137  --   K  --  3.5  --   CL  --  100  --   CO2  --  28  --   GLUCOSE  --  109*  --   BUN  --  15  --   CREATININE  --  0.85  --   CALCIUM  --  9.3  --   MG 1.9  --   --   AST  --   --  18  ALT  --   --  17  ALKPHOS  --   --  57  BILITOT  --   --  1.0   Assessment and Plan Latasha Lawrence is a 52 y.o. 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    Chest pain appears atypical troponin's time to negative. EKG no acute STT changes. -- Myoview stress  test for tomorrow -- chest pain free. -- Patient does not want to go home without getting test done due to issues with transportation  Chronic anxiety --Continue venlafaxine, trazodone and Atarax as needed -- I have discussed with patient will not be prescribing Xanax. She got one dose in the morning. Worried about substance abuse given her history of polysubstance abuse. I have asked her to get PCP in the area. She has been noncompliant with follow-up and has been discharged from the PCP offices in the area (per Pt)   Hypertension: Continue hydrochlorothiazide, losartan and spironolactone    Hyperlipidemia: Continue Lipitor   Procedures: Consults :none CODE STATUS: Full DVT Prophylaxis :enoxaparin Level of care: Progressive Status is: Observation The patient remains OBS appropriate and will d/c before 2 midnights.    TOTAL TIME TAKING CARE OF THIS PATIENT: 35 minutes.  >50% time spent on counselling and coordination of care  Note: This dictation was prepared with Dragon dictation along with smaller phrase technology. Any transcriptional errors that result from this process are unintentional.  Enedina Finner M.D    [facility]   CC: Primary care physician; Patient, No Pcp Per

## 2023-08-09 ENCOUNTER — Encounter (HOSPITAL_BASED_OUTPATIENT_CLINIC_OR_DEPARTMENT_OTHER)

## 2023-08-09 ENCOUNTER — Encounter: Payer: Self-pay | Admitting: Internal Medicine

## 2023-08-09 DIAGNOSIS — R072 Precordial pain: Secondary | ICD-10-CM | POA: Diagnosis not present

## 2023-08-09 DIAGNOSIS — R079 Chest pain, unspecified: Secondary | ICD-10-CM | POA: Diagnosis not present

## 2023-08-09 LAB — NM MYOCAR MULTI W/SPECT W/WALL MOTION / EF
Base ST Depression (mm): 0 mm
LV dias vol: 40 mL (ref 46–106)
LV sys vol: 7 mL
MPHR: 170 {beats}/min
Nuc Stress EF: 83 %
Peak HR: 100 {beats}/min
Percent HR: 58 %
Rest HR: 85 {beats}/min
Rest Nuclear Isotope Dose: 10.5 mCi
SDS: 0
SRS: 0
SSS: 0
ST Depression (mm): 0 mm
Stress Nuclear Isotope Dose: 32.1 mCi
TID: 0.89

## 2023-08-09 MED ORDER — ATORVASTATIN CALCIUM 80 MG PO TABS: 80.0000 mg | ORAL_TABLET | Freq: Every day | ORAL | 1 refills | Status: DC

## 2023-08-09 MED ORDER — TECHNETIUM TC 99M TETROFOSMIN IV KIT
10.0000 | PACK | Freq: Once | INTRAVENOUS | Status: AC
  Administered 2023-08-09: 10.48 via INTRAVENOUS

## 2023-08-09 MED ORDER — SPIRONOLACTONE 25 MG PO TABS: 25.0000 mg | ORAL_TABLET | Freq: Every day | ORAL | 0 refills | Status: DC

## 2023-08-09 MED ORDER — REGADENOSON 0.4 MG/5ML IV SOLN
0.4000 mg | Freq: Once | INTRAVENOUS | Status: AC
  Administered 2023-08-09: 0.4 mg via INTRAVENOUS

## 2023-08-09 MED ORDER — PANTOPRAZOLE SODIUM 40 MG PO TBEC: 40.0000 mg | DELAYED_RELEASE_TABLET | Freq: Every day | ORAL | 0 refills | Status: DC

## 2023-08-09 MED ORDER — HYDROXYZINE PAMOATE 100 MG PO CAPS: 100.0000 mg | ORAL_CAPSULE | Freq: Three times a day (TID) | ORAL | 0 refills | Status: DC | PRN

## 2023-08-09 MED ORDER — ASPIRIN 81 MG PO TBEC: 81.0000 mg | DELAYED_RELEASE_TABLET | Freq: Every day | ORAL | 12 refills | Status: AC

## 2023-08-09 MED ORDER — HYDROCHLOROTHIAZIDE 25 MG PO TABS: 25.0000 mg | ORAL_TABLET | Freq: Every day | ORAL | 0 refills | Status: DC

## 2023-08-09 MED ORDER — TECHNETIUM TC 99M TETROFOSMIN IV KIT
32.0500 | PACK | Freq: Once | INTRAVENOUS | Status: AC | PRN
  Administered 2023-08-09: 32.05 via INTRAVENOUS

## 2023-08-09 MED ORDER — VENLAFAXINE HCL ER 150 MG PO CP24: 150.0000 mg | ORAL_CAPSULE | Freq: Every day | ORAL | 0 refills | Status: DC

## 2023-08-09 MED ORDER — TRAZODONE HCL 100 MG PO TABS: 200.0000 mg | ORAL_TABLET | Freq: Every day | ORAL | 0 refills | Status: DC

## 2023-08-09 MED ORDER — LOSARTAN POTASSIUM 50 MG PO TABS: 50.0000 mg | ORAL_TABLET | Freq: Every day | ORAL | 0 refills | Status: DC

## 2023-08-09 NOTE — Discharge Summary (Addendum)
Physician Discharge Summary   Patient: Latasha Lawrence MRN: [id number] DOB: 1971-05-08  Admit date:     08/05/2023  Discharge date: 08/09/23  Discharge Physician: Enedina Finner   PCP: Patient, No Pcp Per   Recommendations at discharge:    Find PCP in the are to establish care  Discharge Diagnoses: Principal Problem:   Precordial chest pain Active Problems:   SIRS (systemic inflammatory response syndrome) (HCC)   Fibromuscular dysplasia of left carotid artery with severe stenosis and dissection s/p endovascular stent 2018 (HCC)   Hypokalemia   Tobacco use disorder   Anxiety and depression   Fibromyalgia   Chronic back pain   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 female  with medical history significant for hyperlipidemia, hypertension, family history of MI in her in his 82s, tobacco use disorder, depression and anxiety, fibromyalgia, chronic back pain and neuropathy, prior substance abuse, tobacco use disorder, embolic stroke 2018, secondary to left internal carotid dissection(underlying fibromuscular dysplasia) s/p endovascular stent who presents to the ED with a 2-day history of chest pain.      Chest pain appears atypical troponin's time to negative. EKG no acute STT changes. -- Myoview stress test for tomorrow -- chest pain free. -- Patient does not want to go home without getting test done due to issues with transportation --Myoview results normal per Cardiology  Chronic anxiety H/o Polysubstance abuse --Continue venlafaxine, trazodone and Atarax as needed -- I have discussed with patient will not be prescribing Xanax. She got one dose in the morning. Worried about substance abuse given her history of polysubstance abuse. I have asked her to get PCP in the area. She has been noncompliant with follow-up and has been discharged from the PCP offices in the area (per Pt)    Hypertension: Continue  hydrochlorothiazide, losartan and spironolactone    Hyperlipidemia: Continue Lipitor  D/c home       Procedures performed: Myoview stress test  Disposition: Home Diet recommendation:  Discharge Diet Orders (From admission, onward)     Start     Ordered   08/09/23 0000  Diet - low sodium heart healthy        08/09/23 1400           Cardiac diet DISCHARGE MEDICATION: Allergies as of 08/09/2023       Reactions   Nsaids Other (See Comments)   Causes ulcers to bleed   Morphine And Codeine Itching        Medication List     STOP taking these medications    ondansetron 4 MG disintegrating tablet Commonly known as: ZOFRAN-ODT       TAKE these medications    aspirin EC 81 MG tablet Take 1 tablet (81 mg total) by mouth daily. Swallow whole. Start taking on: August 10, 2023   atorvastatin 80 MG tablet Commonly known as: LIPITOR Take 1 tablet (80 mg total) by mouth daily at 6 PM.   fluticasone 50 MCG/ACT nasal spray Commonly known as: FLONASE Place 1 spray into both nostrils in the morning and at bedtime.   hydrochlorothiazide 25 MG tablet Commonly known as: HYDRODIURIL Take 1 tablet (25 mg total) by mouth daily.   hydrOXYzine 100 MG capsule Commonly known as: VISTARIL Take 1 capsule (100 mg total) by mouth 3 (three) times daily as needed.   lidocaine 4 % Place 1 patch onto the skin daily.   losartan 50 MG tablet  Commonly known as: COZAAR Take 1 tablet (50 mg total) by mouth daily.   pantoprazole 40 MG tablet Commonly known as: PROTONIX Take 1 tablet (40 mg total) by mouth daily.   spironolactone 25 MG tablet Commonly known as: ALDACTONE Take 1 tablet (25 mg total) by mouth daily.   traZODone 100 MG tablet Commonly known as: DESYREL Take 2 tablets (200 mg total) by mouth at bedtime.   venlafaxine XR 75 MG 24 hr capsule Commonly known as: EFFEXOR-XR Take 75 mg by mouth daily. Take along with 150 mg capsule for a total daily dose of 225  mg.   venlafaxine XR 150 MG 24 hr capsule Commonly known as: EFFEXOR-XR Take 1 capsule (150 mg total) by mouth daily with breakfast. Take along with 75 mg capsule for a total daily dose of 225 mg.          Condition at discharge: fair  The results of significant diagnostics from this hospitalization (including imaging, microbiology, ancillary and laboratory) are listed below for reference.   Imaging Studies: NM Myocar Multi W/Spect W/Wall Motion / EF  Result Date: 08/09/2023 Pharmacological myocardial perfusion imaging study with no significant  ischemia Normal wall motion, EF estimated at 86% No EKG changes concerning for ischemia at peak stress or in recovery. CT attenuation correction images with no significant coronary calcification or aortic atherosclerosis Low risk scan Signed, Dossie Arbour, MD, Ph.D [facility]   ECHOCARDIOGRAM COMPLETE  Result Date: 08/06/2023    ECHOCARDIOGRAM REPORT   Patient Name:   Latasha Lawrence Date of Exam: 08/06/2023 Medical Rec #:  [id number]     Height:       66.0 in Accession #:    [id number]    Weight:       181.0 lb Date of Birth:  October 09, 1971    BSA:          1.917 m Patient Age:    51 years      BP:           113/66 mmHg Patient Gender: F             HR:           72 bpm. Exam Location:  [facility] Procedure: 2D Echo, Cardiac Doppler, Color Doppler and Strain Analysis Indications:     Chest Pain  History:         Patient has prior history of Echocardiogram examinations, most                  recent 04/20/2017. Stroke, Signs/Symptoms:Chest Pain; Risk                  Factors:Hypertension, Dyslipidemia and Current Smoker.                  Polysubstance abuse.  Sonographer:     Mikki Harbor Referring Phys:  [id number] Andris Baumann Diagnosing Phys: Wilfred Lacy  Sonographer Comments: Global longitudinal strain was attempted. IMPRESSIONS  1. Left ventricular ejection fraction, by estimation, is 55 to 60%. The left ventricle has normal function. The left  ventricle has no regional wall motion abnormalities. Left ventricular diastolic parameters are consistent with Grade I diastolic dysfunction (impaired relaxation).  2. Right ventricular systolic function is normal. The right ventricular size is normal. Tricuspid regurgitation signal is inadequate for assessing PA pressure.  3. The mitral valve is normal in structure. No evidence of mitral valve regurgitation. No evidence of mitral stenosis.  4. The aortic valve is tricuspid.  Aortic valve regurgitation is not visualized. No aortic stenosis is present.  5. The inferior vena cava is normal in size with greater than 50% respiratory variability, suggesting right atrial pressure of 3 mmHg. FINDINGS  Left Ventricle: Left ventricular ejection fraction, by estimation, is 55 to 60%. The left ventricle has normal function. The left ventricle has no regional wall motion abnormalities. The left ventricular internal cavity size was normal in size. There is  no left ventricular hypertrophy. Left ventricular diastolic parameters are consistent with Grade I diastolic dysfunction (impaired relaxation). Right Ventricle: The right ventricular size is normal. No increase in right ventricular wall thickness. Right ventricular systolic function is normal. Tricuspid regurgitation signal is inadequate for assessing PA pressure. Left Atrium: Left atrial size was normal in size. Right Atrium: Right atrial size was normal in size. Pericardium: There is no evidence of pericardial effusion. Mitral Valve: The mitral valve is normal in structure. No evidence of mitral valve regurgitation. No evidence of mitral valve stenosis. MV peak gradient, 2.7 mmHg. The mean mitral valve gradient is 1.0 mmHg. Tricuspid Valve: The tricuspid valve is normal in structure. Tricuspid valve regurgitation is not demonstrated. Aortic Valve: The aortic valve is tricuspid. Aortic valve regurgitation is not visualized. No aortic stenosis is present. Aortic valve mean  gradient measures 5.0 mmHg. Aortic valve peak gradient measures 8.9 mmHg. Aortic valve area, by VTI measures 2.87 cm. Pulmonic Valve: The pulmonic valve was normal in structure. Pulmonic valve regurgitation is not visualized. Aorta: The aortic root is normal in size and structure. Venous: The inferior vena cava is normal in size with greater than 50% respiratory variability, suggesting right atrial pressure of 3 mmHg. IAS/Shunts: No atrial level shunt detected by color flow Doppler.  LEFT VENTRICLE PLAX 2D LVIDd:         4.70 cm   Diastology LVIDs:         3.40 cm   LV e' medial:    8.49 cm/s LV PW:         0.90 cm   LV E/e' medial:  8.4 LV IVS:        1.10 cm   LV e' lateral:   9.25 cm/s LVOT diam:     2.00 cm   LV E/e' lateral: 7.8 LV SV:         82 LV SV Index:   43 LVOT Area:     3.14 cm  RIGHT VENTRICLE RV Basal diam:  3.35 cm RV Mid diam:    3.80 cm RV S prime:     16.20 cm/s TAPSE (M-mode): 2.3 cm LEFT ATRIUM             Index        RIGHT ATRIUM           Index LA diam:        3.00 cm 1.57 cm/m   RA Area:     15.40 cm LA Vol (A2C):   46.8 ml 24.42 ml/m  RA Volume:   40.00 ml  20.87 ml/m LA Vol (A4C):   33.1 ml 17.27 ml/m LA Biplane Vol: 39.6 ml 20.66 ml/m  AORTIC VALVE                     PULMONIC VALVE AV Area (Vmax):    2.93 cm      PV Vmax:       1.04 m/s AV Area (Vmean):   2.55 cm      PV Peak grad:  4.3 mmHg AV Area (VTI):     2.87 cm AV Vmax:           149.00 cm/s AV Vmean:          100.000 cm/s AV VTI:            0.285 m AV Peak Grad:      8.9 mmHg AV Mean Grad:      5.0 mmHg LVOT Vmax:         139.00 cm/s LVOT Vmean:        81.200 cm/s LVOT VTI:          0.260 m LVOT/AV VTI ratio: 0.91  AORTA Ao Root diam: 3.50 cm MITRAL VALVE MV Area (PHT): 3.12 cm    SHUNTS MV Area VTI:   3.46 cm    Systemic VTI:  0.26 m MV Peak grad:  2.7 mmHg    Systemic Diam: 2.00 cm MV Mean grad:  1.0 mmHg MV Vmax:       0.82 m/s MV Vmean:      53.0 cm/s MV Decel Time: 243 msec MV E velocity: 71.70 cm/s MV A  velocity: 73.90 cm/s MV E/A ratio:  0.97 Dalton McleanMD Electronically signed by Wilfred Lacy Signature Date/Time: 08/06/2023/12:07:42 PM    Final    US Carotid Bilateral  Result Date: 08/05/2023 CLINICAL DATA:  Hypertension, stroke, left carotid stent, hyperlipidemia, tobacco abuse EXAM: BILATERAL CAROTID DUPLEX ULTRASOUND TECHNIQUE: Wallace Cullens scale imaging, color Doppler and duplex ultrasound were performed of bilateral carotid and vertebral arteries in the neck. COMPARISON:  08/05/2023, 04/18/2017 FINDINGS: Criteria: Quantification of carotid stenosis is based on velocity parameters that correlate the residual internal carotid diameter with NASCET-based stenosis levels, using the diameter of the distal internal carotid lumen as the denominator for stenosis measurement. The following velocity measurements were obtained: RIGHT ICA: 75/28 cm/sec CCA: 86/22 cm/sec SYSTOLIC ICA/CCA RATIO:  0.9 ECA: 99 cm/sec LEFT ICA: 77/35 cm/sec CCA: 83/21 cm/sec SYSTOLIC ICA/CCA RATIO:  0.9 ECA: 55 cm/sec RIGHT CAROTID ARTERY: Mild atheromatous plaque at the carotid bifurcation. RIGHT VERTEBRAL ARTERY:  Antegrade LEFT CAROTID ARTERY: Mild atheromatous plaque at the carotid bifurcation. LEFT VERTEBRAL ARTERY:  Antegrade IMPRESSION: 1. Estimated 0-49% stenosis within the internal carotid arteries bilaterally. Electronically Signed   By: Sharlet Salina M.D.   On: 08/05/2023 21:47   CT Angio Chest PE W and/or Wo Contrast  Result Date: 08/05/2023 CLINICAL DATA:  Chest pain and left shoulder pain for 3 days EXAM: CT ANGIOGRAPHY CHEST WITH CONTRAST TECHNIQUE: Multidetector CT imaging of the chest was performed using the standard protocol during bolus administration of intravenous contrast. Multiplanar CT image reconstructions and MIPs were obtained to evaluate the vascular anatomy. RADIATION DOSE REDUCTION: This exam was performed according to the departmental dose-optimization program which includes automated exposure control,  adjustment of the mA and/or kV according to patient size and/or use of iterative reconstruction technique. CONTRAST:  75mL OMNIPAQUE IOHEXOL 350 MG/ML SOLN COMPARISON:  Radiograph 08/05/2023 and CTA chest 11/04/2022 FINDINGS: Cardiovascular: Negative for acute pulmonary embolism. Normal caliber thoracic aorta. No pericardial effusion. Mediastinum/Nodes: Trachea and esophagus are unremarkable. No thoracic adenopathy Lungs/Pleura: No focal consolidation, pleural effusion, or pneumothorax. Upper Abdomen: No acute abnormality. Musculoskeletal: No acute fracture. Review of the MIP images confirms the above findings. IMPRESSION: Negative for acute pulmonary embolism. No acute abnormality in the chest. Electronically Signed   By: Minerva Fester M.D.   On: 08/05/2023 19:17   DG Chest 2 View  Result Date: 08/05/2023 CLINICAL DATA:  chest pain EXAM: CHEST - 2 VIEW COMPARISON:  August 04, 2023 FINDINGS: The cardiomediastinal silhouette is unchanged in contour. No pleural effusion. No pneumothorax. No acute pleuroparenchymal abnormality. Visualized abdomen is unremarkable. Mild degenerative changes of the thoracolumbar junction. IMPRESSION: No acute cardiopulmonary abnormality. Electronically Signed   By: Meda Klinefelter M.D.   On: 08/05/2023 17:52   DG Chest 2 View  Result Date: 08/04/2023 CLINICAL DATA:  Chest pain for 1 day. EXAM: CHEST - 2 VIEW COMPARISON:  Chest radiograph dated 11/04/2022. FINDINGS: The heart size and mediastinal contours are within normal limits. Both lungs are clear. The visualized skeletal structures are unremarkable. IMPRESSION: No active cardiopulmonary disease. Electronically Signed   By: Romona Curls M.D.   On: 08/04/2023 10:54    Microbiology: Results for orders placed or performed during the hospital encounter of 08/05/23  Resp panel by RT-PCR (RSV, Flu A&B, Covid) Anterior Nasal Swab     Status: None   Collection Time: 08/05/23  5:30 PM   Specimen: Anterior Nasal Swab   Result Value Ref Range Status   SARS Coronavirus 2 by RT PCR NEGATIVE NEGATIVE Final    Comment: (NOTE) SARS-CoV-2 target nucleic acids are NOT DETECTED.  The SARS-CoV-2 RNA is generally detectable in upper respiratory specimens during the acute phase of infection. The lowest concentration of SARS-CoV-2 viral copies this assay can detect is 138 copies/mL. A negative result does not preclude SARS-Cov-2 infection and should not be used as the sole basis for treatment or other patient management decisions. A negative result may occur with  improper specimen collection/handling, submission of specimen other than nasopharyngeal swab, presence of viral mutation(s) within the areas targeted by this assay, and inadequate number of viral copies(<138 copies/mL). A negative result must be combined with clinical observations, patient history, and epidemiological information. The expected result is Negative.  Fact Sheet for Patients:  BloggerCourse.com  Fact Sheet for Healthcare Providers:  SeriousBroker.it  This test is no t yet approved or cleared by the [place] FDA and  has been authorized for detection and/or diagnosis of SARS-CoV-2 by FDA under an Emergency Use Authorization (EUA). This EUA will remain  in effect (meaning this test can be used) for the duration of the COVID-19 declaration under Section 564(b)(1) of the Act, 21 U.S.C.section 360bbb-3(b)(1), unless the authorization is terminated  or revoked sooner.       Influenza A by PCR NEGATIVE NEGATIVE Final   Influenza B by PCR NEGATIVE NEGATIVE Final    Comment: (NOTE) The Xpert Xpress SARS-CoV-2/FLU/RSV plus assay is intended as an aid in the diagnosis of influenza from Nasopharyngeal swab specimens and should not be used as a sole basis for treatment. Nasal washings and aspirates are unacceptable for Xpert Xpress SARS-CoV-2/FLU/RSV testing.  Fact Sheet for  Patients: BloggerCourse.com  Fact Sheet for Healthcare Providers: SeriousBroker.it  This test is not yet approved or cleared by the [place] FDA and has been authorized for detection and/or diagnosis of SARS-CoV-2 by FDA under an Emergency Use Authorization (EUA). This EUA will remain in effect (meaning this test can be used) for the duration of the COVID-19 declaration under Section 564(b)(1) of the Act, 21 U.S.C. section 360bbb-3(b)(1), unless the authorization is terminated or revoked.     Resp Syncytial Virus by PCR NEGATIVE NEGATIVE Final    Comment: (NOTE) Fact Sheet for Patients: BloggerCourse.com  Fact Sheet for Healthcare Providers: SeriousBroker.it  This test is not yet approved or cleared by the [place] and has been authorized  for detection and/or diagnosis of SARS-CoV-2 by FDA under an Emergency Use Authorization (EUA). This EUA will remain in effect (meaning this test can be used) for the duration of the COVID-19 declaration under Section 564(b)(1) of the Act, 21 U.S.C. section 360bbb-3(b)(1), unless the authorization is terminated or revoked.  Performed at [facility], [address] [place], [place] [address]     Labs: CBC: Recent Labs  Lab 08/04/23 1000 08/05/23 1730 08/06/23 0352  WBC 12.1* 16.9* 12.8*  HGB 16.7* 16.7* 15.3*  HCT 50.2* 48.6* 45.0  MCV 88.1 85.4 86.4  PLT 325 370 300   Basic Metabolic Panel: Recent Labs  Lab 08/04/23 1000 08/05/23 1730 08/05/23 1822 08/06/23 0352  NA 135 136  --  137  K 3.3* 2.8*  --  3.5  CL 97* 99  --  100  CO2 25 26  --  28  GLUCOSE 127* 146*  --  109*  BUN 17 14  --  15  CREATININE 0.76 0.87  --  0.85  CALCIUM 9.9 10.0  --  9.3  MG  --   --  1.9  --    Liver Function Tests: Recent Labs  Lab 08/06/23 0620  AST 18  ALT 17  ALKPHOS 57  BILITOT 1.0  PROT 7.0  ALBUMIN 3.9     Discharge time spent: greater than 30 minutes.  Signed: Enedina Finner, MD [facility] 08/09/2023

## 2023-08-09 NOTE — TOC Transition Note (Signed)
Transition of Care [facility]) - CM/SW Discharge Note   Patient Details  Name: Latasha Lawrence MRN: [id number] Date of Birth: Aug 21, 1971  [facility] CM/SW Contact:  Truddie Hidden, RN Phone Number: 08/09/2023, 3:37 PM   Clinical Narrative:    Cab voucher placed on patient chart PCP list added to AVS.   Nurse notified.   TOC signing off          Patient Goals and CMS Choice      Discharge Placement                         Discharge Plan and Services Additional resources added to the After Visit Summary for                                       Social Determinants of Health (SDOH) Interventions SDOH Screenings   Food Insecurity: Food Insecurity Present (08/06/2023)  Housing: Low Risk  (08/06/2023)  Transportation Needs: No Transportation Needs (08/06/2023)  Utilities: Not At Risk (08/06/2023)  Depression (PHQ2-9): High Risk (01/09/2023)  Tobacco Use: High Risk (08/05/2023)     Readmission Risk Interventions     No data to display

## 2023-08-09 NOTE — Progress Notes (Signed)
Patient is ready for discharge. I have removed patient IV without complication. She has been taken off telemetry. I have reviewed discharge instructions with patient. She has all of her belongings with her. She has reviewed to take a wheelchair to the front of the hospital to meet her transportation home. Patient has chosen to walk herself off the unit and will meet her transportation home by the ER.

## 2023-08-09 NOTE — Discharge Instructions (Addendum)
Smoking cessation advised  Pt to find PCP in the area to establish care  Some PCP options in [place] area- not a comprehensive list  [facility]- [phone] [facility]- [phone] [facility]- [phone] [facility]- [phone] [facility]- [phone] [facility]- [phone]  or [facility] Physician Referral Line [phone]

## 2023-09-22 ENCOUNTER — Emergency Department
Admission: EM | Admit: 2023-09-22 | Discharge: 2023-09-22 | Disposition: A | Discharge status: Home / Self Care | Attending: Student in an Organized Health Care Education/Training Program | Admitting: Student in an Organized Health Care Education/Training Program

## 2023-09-22 ENCOUNTER — Other Ambulatory Visit: Payer: Self-pay

## 2023-09-22 DIAGNOSIS — F17200 Nicotine dependence, unspecified, uncomplicated: Secondary | ICD-10-CM | POA: Insufficient documentation

## 2023-09-22 DIAGNOSIS — Z760 Encounter for issue of repeat prescription: Secondary | ICD-10-CM | POA: Insufficient documentation

## 2023-09-22 DIAGNOSIS — I10 Essential (primary) hypertension: Secondary | ICD-10-CM | POA: Diagnosis not present

## 2023-09-22 MED ORDER — TRAZODONE HCL 50 MG PO TABS: 50.0000 mg | ORAL_TABLET | Freq: Every day | ORAL | 0 refills | Status: DC

## 2023-09-22 NOTE — ED Provider Notes (Signed)
[facility] Provider Note    Event Date/Time   First MD Initiated Contact with Patient 09/22/23 1247     (approximate)   History   Medication Refill   HPI  Latasha Lawrence is a 52 y.o. female who presents today with request for medication refill.  She reports that she has been trying to decrease the amount that she takes, but is having trouble sleeping without it.  She has not been able to establish care with a PCP.   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) 06/04/2021   Other specified problems related to psychosocial circumstances 05/09/2021   Dental infection 04/14/2021   Bleeding nose 02/07/2021   Memory loss or impairment 02/07/2021   Hyperlipidemia 12/30/2020   Gastroesophageal reflux disease 09/09/2020   Other constipation 09/09/2020   Panic disorder 07/15/2020   Tobacco use disorder 07/15/2020   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 Underlying fibromuscular dysplasia  04/18/2017   Hypertension    Fibromyalgia    Vitamin D deficiency disease    Hypokalemia    Neutrophilic  leukocytosis    Chronic back pain    History of stomach ulcers 05/19/2013   Right hip pain 05/19/2013   Cocaine abuse in remission (HCC) 05/16/2013   Nondependent alcohol abuse, in remission 05/16/2013          Physical Exam   Triage Vital Signs: ED Triage Vitals  Encounter Vitals Group     BP 09/22/23 1150 (!) 154/81     Systolic BP Percentile --      Diastolic BP Percentile --      Pulse Rate 09/22/23 1150 66     Resp 09/22/23 1150 18     Temp 09/22/23 1150 98.5 F (36.9 C)     Temp src --      SpO2 09/22/23 1150 95 %     Weight 09/22/23 1151 200 lb (90.7 kg)     Height 09/22/23 1151 5\' 6"  (1.676 m)     Head Circumference --      Peak Flow --      Pain Score 09/22/23 1151 0     Pain Loc --      Pain Education --      Exclude from Growth Chart --     Most recent vital signs: Vitals:   09/22/23 1150  BP: (!) 154/81  Pulse: 66  Resp: 18  Temp: 98.5 F (36.9 C)  SpO2: 95%    Physical Exam Vitals and nursing note reviewed.  Constitutional:      General: Awake and alert. No acute distress.    Appearance: Normal appearance. The patient is normal weight.  HENT:  Head: Normocephalic and atraumatic.     Mouth: Mucous membranes are moist.  Eyes:     General: PERRL. Normal EOMs        Right eye: No discharge.        Left eye: No discharge.     Conjunctiva/sclera: Conjunctivae normal.  Cardiovascular:     Rate and Rhythm: Normal rate and regular rhythm.     Pulses: Normal pulses.     Heart sounds: Normal heart sounds Pulmonary:     Effort: Pulmonary effort is normal. No respiratory distress.     Breath sounds: Normal breath sounds.  Abdominal:     Abdomen is soft. There is no abdominal tenderness. No rebound or guarding. No distention. Musculoskeletal:        General: No swelling. Normal range of motion.     Cervical back: Normal range of motion and neck supple.  Skin:    General: Skin is warm and dry.     Capillary Refill: Capillary refill takes less  than 2 seconds.     Findings: No rash.  Neurological:     Mental Status: The patient is awake and alert.      ED Results / Procedures / Treatments   Labs (all labs ordered are listed, but only abnormal results are displayed) Labs Reviewed - No data to display   EKG     RADIOLOGY     PROCEDURES:  Critical Care performed:   Procedures   MEDICATIONS ORDERED IN ED: Medications - No data to display   IMPRESSION / MDM / ASSESSMENT AND PLAN / ED COURSE  I reviewed the triage vital signs and the nursing notes.   Differential diagnosis includes, but is not limited to, medication refill, medication dependence, insomnia.  I reviewed the patient's chart.  Patient was seen on 09/04/2023 at [facility] with the same complaint.  Per chart review she takes 100 mg nightly.  Patient is awake and alert, hemodynamically stable and afebrile.  When I asked about the fact that she should have more at home given that she was given a 30-day supply, patient reports that she thinks that she has a couple left but is not sure.  She reports that she did not take it the last couple of days and has had trouble sleeping.  She is worried because it is the weekend.  I advised that she needs to follow-up with an outpatient provider to have this medication refilled and cannot become dependent on the ER refilling this medication.  She was given 3 days worth at a lower dose and we again discussed proper dosing.  She was also advised that she cannot drive, operate heavy machinery, or perform any test that require concentration while taking this medication.  Patient understands and agrees with plan.  Referral placed for PCP.  She was discharged in stable condition.  Patient's presentation is most consistent with acute complicated illness / injury requiring diagnostic workup.    FINAL CLINICAL IMPRESSION(S) / ED DIAGNOSES   Final diagnoses:  Medication refill     Rx / DC Orders   ED Discharge Orders           Ordered    traZODone (DESYREL) 50 MG tablet  Daily at bedtime        09/22/23 1254    Ambulatory Referral to Primary Care (Establish Care)        09/22/23 1255             Note:  This document was  prepared using Conservation officer, historic buildings and may include unintentional dictation errors.   Jackelyn Hoehn, PA-C 09/22/23 1313    Willy Eddy, MD 09/22/23 [phone]

## 2023-09-22 NOTE — ED Triage Notes (Signed)
Pt reports needs refill of trazadone and does not have PCP

## 2023-09-22 NOTE — Discharge Instructions (Addendum)
You should still have more trazodone from your previous prescription, therefore you are only given a couple days worth this time.  Please establish care with a primary care provider.  Please return for any new, worsening, or change in symptoms or other concerns.  Remember that you cannot drive, operate heavy machinery, or perform any test that require concentration while taking this medication.  It was a pleasure caring for you today.

## 2023-10-12 ENCOUNTER — Encounter: Payer: Self-pay | Admitting: Emergency Medicine

## 2023-11-06 ENCOUNTER — Encounter: Payer: Self-pay | Admitting: Internal Medicine

## 2023-11-06 ENCOUNTER — Ambulatory Visit (INDEPENDENT_AMBULATORY_CARE_PROVIDER_SITE_OTHER): Admitting: Internal Medicine

## 2023-11-06 VITALS — BP 150/90 | Ht 66.0 in | Wt 192.6 lb

## 2023-11-06 DIAGNOSIS — E782 Mixed hyperlipidemia: Secondary | ICD-10-CM | POA: Diagnosis not present

## 2023-11-06 DIAGNOSIS — M150 Primary generalized (osteo)arthritis: Secondary | ICD-10-CM

## 2023-11-06 DIAGNOSIS — B009 Herpesviral infection, unspecified: Secondary | ICD-10-CM

## 2023-11-06 DIAGNOSIS — Z8673 Personal history of transient ischemic attack (TIA), and cerebral infarction without residual deficits: Secondary | ICD-10-CM

## 2023-11-06 DIAGNOSIS — K219 Gastro-esophageal reflux disease without esophagitis: Secondary | ICD-10-CM | POA: Diagnosis not present

## 2023-11-06 DIAGNOSIS — F1990 Other psychoactive substance use, unspecified, uncomplicated: Secondary | ICD-10-CM

## 2023-11-06 DIAGNOSIS — R7303 Prediabetes: Secondary | ICD-10-CM | POA: Diagnosis not present

## 2023-11-06 DIAGNOSIS — I10 Essential (primary) hypertension: Secondary | ICD-10-CM

## 2023-11-06 DIAGNOSIS — Z23 Encounter for immunization: Secondary | ICD-10-CM

## 2023-11-06 DIAGNOSIS — F99 Mental disorder, not otherwise specified: Secondary | ICD-10-CM

## 2023-11-06 DIAGNOSIS — F32A Depression, unspecified: Secondary | ICD-10-CM

## 2023-11-06 DIAGNOSIS — F5105 Insomnia due to other mental disorder: Secondary | ICD-10-CM

## 2023-11-06 DIAGNOSIS — M797 Fibromyalgia: Secondary | ICD-10-CM

## 2023-11-06 DIAGNOSIS — G4700 Insomnia, unspecified: Secondary | ICD-10-CM | POA: Insufficient documentation

## 2023-11-06 DIAGNOSIS — F419 Anxiety disorder, unspecified: Secondary | ICD-10-CM

## 2023-11-06 MED ORDER — TRAZODONE HCL 100 MG PO TABS: 200.0000 mg | ORAL_TABLET | Freq: Every day | ORAL | 1 refills | Status: DC

## 2023-11-06 MED ORDER — PANTOPRAZOLE SODIUM 40 MG PO TBEC: 40.0000 mg | DELAYED_RELEASE_TABLET | Freq: Every day | ORAL | 1 refills | Status: DC

## 2023-11-06 MED ORDER — OLMESARTAN-AMLODIPINE-HCTZ 40-10-25 MG PO TABS: 1.0000 | ORAL_TABLET | Freq: Every day | ORAL | 1 refills | Status: DC

## 2023-11-06 MED ORDER — VENLAFAXINE HCL ER 150 MG PO CP24: 150.0000 mg | ORAL_CAPSULE | Freq: Every day | ORAL | 1 refills | Status: DC

## 2023-11-06 MED ORDER — ATORVASTATIN CALCIUM 80 MG PO TABS: 80.0000 mg | ORAL_TABLET | Freq: Every day | ORAL | 1 refills | Status: AC

## 2023-11-06 MED ORDER — VENLAFAXINE HCL ER 75 MG PO CP24: 75.0000 mg | ORAL_CAPSULE | Freq: Every day | ORAL | 1 refills | Status: DC

## 2023-11-06 MED ORDER — HYDROXYZINE PAMOATE 100 MG PO CAPS: 100.0000 mg | ORAL_CAPSULE | Freq: Three times a day (TID) | ORAL | 1 refills | Status: DC | PRN

## 2023-11-06 NOTE — Assessment & Plan Note (Signed)
A1C today Encouraged low carb diet and exercise for weight loss

## 2023-11-06 NOTE — Assessment & Plan Note (Signed)
In remission.

## 2023-11-06 NOTE — Assessment & Plan Note (Signed)
Ok to take tylenol OTC as needed

## 2023-11-06 NOTE — Assessment & Plan Note (Signed)
C met and  lipid profile today Encouraged her to consume a low-fat diet Continue atorvastatin

## 2023-11-06 NOTE — Assessment & Plan Note (Signed)
Stable on her current dose of venlafaxine and hydroxyzine Support offered

## 2023-11-06 NOTE — Assessment & Plan Note (Signed)
Will discontinue losartan, hydrochlorothiazide and potassium RX for omelsartan-amlodipine-HCT 40-10-12.5 Reinforced DASH diet and exercise for weight loss

## 2023-11-06 NOTE — Assessment & Plan Note (Signed)
Not on antiviral therapy at this time

## 2023-11-06 NOTE — Assessment & Plan Note (Signed)
Continue trazodone.  Refilled today.

## 2023-11-06 NOTE — Patient Instructions (Signed)

## 2023-11-06 NOTE — Assessment & Plan Note (Signed)
No residual effect CMET and lipid profile today Discussed importance of better blood pressure control Continue atorvastatin and aspirin

## 2023-11-06 NOTE — Progress Notes (Signed)
Subjective:    Patient ID: Latasha Lawrence, female    DOB: 11/29/1970, 53 y.o.   MRN: [id number]  HPI  Patient presents to clinic today to establish care and for management of the conditions listed below.  HTN: Her BP today is 152/88.  She is taking losartan, HCTZ and spironolactone as prescribed.  ECG from 07/2023 reviewed.  HLD with history of stroke: Her last LDL was 48, triglycerides 147, 11/2022.  She denies myalgias on atorvastatin.  She is taking aspirin as well.  GERD: Triggered by anxiety.  She denies breakthrough on pantoprazole.  There is no upper GI on file.  Anxiety and depression: Chronic, managed on venlafaxine and hydroxyzine.  She is not currently seeing a therapist or psychiatrist.  She denies SI/HI.  Insomnia: She has difficulty falling asleep and staying asleep.  She is taking trazodone as prescribed.  There is no sleep study on file.  OA/fibromyalgia: Generalized. She is not currently taking any medication for this. She does try to stretch daily.  Prediabetes: Her last A1c was 5.7%, 11/2022.  She is not taking any oral diabetic medication at this time.  She does not check her sugars.  HSV 2: She denies recent flare. She is not currently taking any medication for this.  Review of Systems   Past Medical History:  Diagnosis Date   Abnormal weight gain    Anxiety    Chronic back pain    Controlled substance agreement terminated 07/2014   failed UDS at [facility]   Depression    Fibromyalgia    GERD (gastroesophageal reflux disease)    Headache    Hirsutism    Hypertension    Hypokalemia    Neutrophilic leukocytosis    Periodontal disease    Stomach ulcer    Stroke (HCC)    Vitamin D deficiency disease     Current Outpatient Medications  Medication Sig Dispense Refill   aspirin EC 81 MG tablet Take 1 tablet (81 mg total) by mouth daily. Swallow whole. 30 tablet 12   atorvastatin (LIPITOR) 80 MG tablet Take 1 tablet (80 mg total) by mouth  daily at 6 PM. 30 tablet 1   fluticasone (FLONASE) 50 MCG/ACT nasal spray Place 1 spray into both nostrils in the morning and at bedtime. 16 g 5   hydrochlorothiazide (HYDRODIURIL) 25 MG tablet Take 1 tablet (25 mg total) by mouth daily. 30 tablet 0   hydrOXYzine (VISTARIL) 100 MG capsule Take 1 capsule (100 mg total) by mouth 3 (three) times daily as needed. 15 capsule 0   lidocaine 4 % Place 1 patch onto the skin daily. 20 patch 0   losartan (COZAAR) 50 MG tablet Take 1 tablet (50 mg total) by mouth daily. 30 tablet 0   pantoprazole (PROTONIX) 40 MG tablet Take 1 tablet (40 mg total) by mouth daily. 30 tablet 0   spironolactone (ALDACTONE) 25 MG tablet Take 1 tablet (25 mg total) by mouth daily. 30 tablet 0   traZODone (DESYREL) 100 MG tablet Take 2 tablets (200 mg total) by mouth at bedtime. 15 tablet 0   traZODone (DESYREL) 50 MG tablet Take 1 tablet (50 mg total) by mouth at bedtime for 3 days. 3 tablet 0   venlafaxine XR (EFFEXOR-XR) 150 MG 24 hr capsule Take 1 capsule (150 mg total) by mouth daily with breakfast. Take along with 75 mg capsule for a total daily dose of 225 mg. 30 capsule 0   venlafaxine XR (EFFEXOR-XR) 75  MG 24 hr capsule Take 75 mg by mouth daily. Take along with 150 mg capsule for a total daily dose of 225 mg.     No current facility-administered medications for this visit.    Allergies  Allergen Reactions   Nsaids Other (See Comments)    Causes ulcers to bleed   Morphine And Codeine Itching    Family History  Problem Relation Age of Onset   Arthritis Mother    Cancer Mother        breast   Heart failure Mother    Hyperlipidemia Father    Hypertension Father    Stroke Maternal Grandmother     Social History   Socioeconomic History   Marital status: Widowed    Spouse name: Not on file   Number of children: Not on file   Years of education: Not on file   Highest education level: Not on file  Occupational History   Not on file  Tobacco Use   Smoking  status: Every Day    Current packs/day: 0.50    Average packs/day: 0.5 packs/day for 40.0 years (20.0 ttl pk-yrs)    Types: Cigarettes   Smokeless tobacco: Never  Vaping Use   Vaping status: Former  Substance and Sexual Activity   Alcohol use: No   Drug use: No   Sexual activity: Not Currently    Birth control/protection: None  Other Topics Concern   Not on file  Social History Narrative   Not on file   Social Drivers of Health   Financial Resource Strain: Not on file  Food Insecurity: Food Insecurity Present (08/06/2023)   Hunger Vital Sign    Worried About Running Out of Food in the Last Year: Sometimes true    Ran Out of Food in the Last Year: Sometimes true  Transportation Needs: No Transportation Needs (08/06/2023)   PRAPARE - Administrator, Civil Service (Medical): No    Lack of Transportation (Non-Medical): No  Physical Activity: Not on file  Stress: Not on file  Social Connections: Not on file  Intimate Partner Violence: Not At Risk (08/06/2023)   Humiliation, Afraid, Rape, and Kick questionnaire    Fear of Current or Ex-Partner: No    Emotionally Abused: No    Physically Abused: No    Sexually Abused: No     Constitutional: Denies fever, malaise, fatigue, headache or abrupt weight changes.  HEENT: Denies eye pain, eye redness, ear pain, ringing in the ears, wax buildup, runny nose, nasal congestion, bloody nose, or sore throat. Respiratory: Denies difficulty breathing, shortness of breath, cough or sputum production.   Cardiovascular: Denies chest pain, chest tightness, palpitations or swelling in the hands or feet.  Gastrointestinal: Pt reports intermittent reflux. Denies abdominal pain, bloating, constipation, diarrhea or blood in the stool.  GU: Denies urgency, frequency, pain with urination, burning sensation, blood in urine, odor or discharge. Musculoskeletal: Patient reports chronic joint and muscle pain.  Denies decrease in range of motion,  difficulty with gait, or joint swelling.  Skin: Denies redness, rashes, lesions or ulcercations.  Neurological: Patient reports insomnia.  Denies dizziness, difficulty with memory, difficulty with speech or problems with balance and coordination.  Psych: Patient has a history of anxiety and depression.  Denies  SI/HI.  No other specific complaints in a complete review of systems (except as listed in HPI above).      Objective:   Physical Exam  BP (!) 150/90   Ht 5\' 6"  (1.676 m)  Wt 192 lb 9.6 oz (87.4 kg)   LMP 11/28/2021 (Approximate)   BMI 31.09 kg/m   Wt Readings from Last 3 Encounters:  09/22/23 200 lb (90.7 kg)  08/06/23 181 lb (82.1 kg)  08/04/23 170 lb (77.1 kg)    General: Appears older than her stated age, obese, in NAD. Skin: Warm, dry and intact.  HEENT: Head: normal shape and size; Eyes: sclera white, no icterus, conjunctiva pink, PERRLA and EOMs intact;  Cardiovascular: Normal rate and rhythm. S1,S2 noted.  No murmur, rubs or gallops noted. No JVD or BLE edema. No carotid bruits noted. Pulmonary/Chest: Normal effort and positive vesicular breath sounds. No respiratory distress. No wheezes, rales or ronchi noted.  Abdomen: Soft and nontender. Normal bowel sounds.  Musculoskeletal: No difficulty with gait.  Neurological: Alert and oriented. Coordination normal.  Psychiatric: Mood and affect normal. Mildly anxious appearing. Judgment and thought content normal.    BMET    Component Value Date/Time   NA 137 08/06/2023 0352   NA 142 12/05/2022 1138   NA 138 09/06/2014 0132   K 3.5 08/06/2023 0352   K 3.8 09/06/2014 0132   CL 100 08/06/2023 0352   CL 105 09/06/2014 0132   CO2 28 08/06/2023 0352   CO2 27 09/06/2014 0132   GLUCOSE 109 (H) 08/06/2023 0352   GLUCOSE 105 (H) 09/06/2014 0132   BUN 15 08/06/2023 0352   BUN 8 12/05/2022 1138   BUN 8 09/06/2014 0132   CREATININE 0.85 08/06/2023 0352   CREATININE 0.97 09/06/2014 0132   CALCIUM 9.3 08/06/2023 0352    CALCIUM 9.4 09/06/2014 0132   GFRNONAA >60 08/06/2023 0352   GFRNONAA >60 09/06/2014 0132   GFRNONAA >60 01/21/2014 0531   GFRAA 77 02/19/2020 1452   GFRAA >60 09/06/2014 0132   GFRAA >60 01/21/2014 0531    Lipid Panel     Component Value Date/Time   CHOL 120 12/05/2022 1138   CHOL 268 (H) 01/19/2014 1427   TRIG 113 12/05/2022 1138   TRIG 340 (H) 01/19/2014 1427   HDL 52 12/05/2022 1138   HDL 33 (L) 01/19/2014 1427   CHOLHDL 2.3 12/05/2022 1138   CHOLHDL 8.9 04/19/2017 0718   VLDL UNABLE TO CALCULATE IF TRIGLYCERIDE OVER 400 mg/dL 40/98/1191 4782   VLDL 68 (H) 01/19/2014 1427   LDLCALC 48 12/05/2022 1138   LDLCALC 167 (H) 01/19/2014 1427    CBC    Component Value Date/Time   WBC 12.8 (H) 08/06/2023 0352   RBC 5.21 (H) 08/06/2023 0352   HGB 15.3 (H) 08/06/2023 0352   HGB 15.5 12/05/2022 1138   HCT 45.0 08/06/2023 0352   HCT 46.4 12/05/2022 1138   PLT 300 08/06/2023 0352   PLT 346 12/05/2022 1138   MCV 86.4 08/06/2023 0352   MCV 87 12/05/2022 1138   MCV 86 09/06/2014 0132   MCH 29.4 08/06/2023 0352   MCHC 34.0 08/06/2023 0352   RDW 14.6 08/06/2023 0352   RDW 13.1 12/05/2022 1138   RDW 15.1 (H) 09/06/2014 0132   LYMPHSABS 0.9 12/05/2022 1138   LYMPHSABS 2.1 09/06/2014 0132   MONOABS 0.3 11/04/2022 0259   MONOABS 0.9 09/06/2014 0132   EOSABS 0.1 12/05/2022 1138   EOSABS 0.1 09/06/2014 0132   BASOSABS 0.0 12/05/2022 1138   BASOSABS 0.0 09/06/2014 0132    Hgb A1C Lab Results  Component Value Date   HGBA1C 5.7 (H) 12/05/2022            Assessment & Plan:   RTC  in 2 weeks, followup HTN, 6 months for your annual exam Nicki Reaper, NP

## 2023-11-06 NOTE — Assessment & Plan Note (Addendum)
Avoid foods that trigger you're reflux Encouraged weight loss as this can help reduce reflux symptoms Continue pantoprazole

## 2023-11-07 LAB — LIPID PANEL
Cholesterol: 146 mg/dL (ref <200)
HDL: 46 mg/dL — ABNORMAL LOW (ref >=50)
LDL Cholesterol (Calc): 83 mg/dL
Non-HDL Cholesterol (Calc): 100 mg/dL (ref <130)
Total CHOL/HDL Ratio: 3.2 (calc) (ref <5.0)
Triglycerides: 82 mg/dL (ref <150)

## 2023-11-07 LAB — CBC
HCT: 46 % — ABNORMAL HIGH (ref 35.0–45.0)
Hemoglobin: 15 g/dL (ref 11.7–15.5)
MCH: 29.8 pg (ref 27.0–33.0)
MCHC: 32.6 g/dL (ref 32.0–36.0)
MCV: 91.3 fL (ref 80.0–100.0)
MPV: 9.4 fL (ref 7.5–12.5)
Platelets: 343 10*3/uL (ref 140–400)
RBC: 5.04 10*6/uL (ref 3.80–5.10)
RDW: 12.4 % (ref 11.0–15.0)
WBC: 11.2 10*3/uL — ABNORMAL HIGH (ref 3.8–10.8)

## 2023-11-07 LAB — COMPLETE METABOLIC PANEL WITH GFR
AG Ratio: 1.5 (calc) (ref 1.0–2.5)
ALT: 18 U/L (ref 6–29)
AST: 10 U/L (ref 10–35)
Albumin: 4 g/dL (ref 3.6–5.1)
Alkaline phosphatase (APISO): 71 U/L (ref 37–153)
BUN: 13 mg/dL (ref 7–25)
CO2: 30 mmol/L (ref 20–32)
Calcium: 9.6 mg/dL (ref 8.6–10.4)
Chloride: 102 mmol/L (ref 98–110)
Creat: 0.88 mg/dL (ref 0.50–1.03)
Globulin: 2.6 g/dL (ref 1.9–3.7)
Glucose, Bld: 153 mg/dL — ABNORMAL HIGH (ref 65–99)
Potassium: 3.4 mmol/L — ABNORMAL LOW (ref 3.5–5.3)
Sodium: 141 mmol/L (ref 135–146)
Total Bilirubin: 0.3 mg/dL (ref 0.2–1.2)
Total Protein: 6.6 g/dL (ref 6.1–8.1)
eGFR: 80 mL/min/{1.73_m2} (ref >=60)

## 2023-11-07 LAB — HEMOGLOBIN A1C
Hgb A1c MFr Bld: 5.9 %{Hb} — ABNORMAL HIGH (ref <5.7)
Mean Plasma Glucose: 123 mg/dL
eAG (mmol/L): 6.8 mmol/L

## 2023-11-20 ENCOUNTER — Ambulatory Visit: Payer: Self-pay | Admitting: Internal Medicine

## 2023-11-20 NOTE — Progress Notes (Deleted)
 Subjective:    Patient ID: Latasha Lawrence, female    DOB: [birth year], 53 y.o.   MRN: [id number]  HPI  Patient presents to clinic today for 2-week follow-up of HTN.  At her last visit, her losartan, HCTZ and spironolactone were discontinued and she was started on olmesartan-amlodipine-HCTZ.  She has been taking the medication as prescribed.  Her BP today is.  ECG from 07/2023 reviewed.  Review of Systems   Past Medical History:  Diagnosis Date   Abnormal weight gain    Anxiety    Chronic back pain    Controlled substance agreement terminated 07/2014   failed UDS at [facility]   Depression    Fibromyalgia    GERD (gastroesophageal reflux disease)    Headache    Hirsutism    Hypertension    Hypokalemia    Neutrophilic leukocytosis    Periodontal disease    Stomach ulcer    Stroke (HCC)    Vitamin D deficiency disease     Current Outpatient Medications  Medication Sig Dispense Refill   aspirin EC 81 MG tablet Take 1 tablet (81 mg total) by mouth daily. Swallow whole. 30 tablet 12   atorvastatin (LIPITOR) 80 MG tablet Take 1 tablet (80 mg total) by mouth daily at 6 PM. 90 tablet 1   fluticasone (FLONASE) 50 MCG/ACT nasal spray Place 1 spray into both nostrils in the morning and at bedtime. 16 g 5   hydrOXYzine (VISTARIL) 100 MG capsule Take 1 capsule (100 mg total) by mouth 3 (three) times daily as needed. 90 capsule 1   lidocaine 4 % Place 1 patch onto the skin daily. 20 patch 0   Olmesartan-amLODIPine-HCTZ 40-10-25 MG TABS Take 1 tablet by mouth daily at 12 noon. 90 tablet 1   pantoprazole (PROTONIX) 40 MG tablet Take 1 tablet (40 mg total) by mouth daily. 90 tablet 1   traZODone (DESYREL) 100 MG tablet Take 2 tablets (200 mg total) by mouth at bedtime. 180 tablet 1   venlafaxine XR (EFFEXOR-XR) 150 MG 24 hr capsule Take 1 capsule (150 mg total) by mouth daily with breakfast. Take along with 75 mg capsule for a total daily dose of 225 mg. 90 capsule 1    venlafaxine XR (EFFEXOR-XR) 75 MG 24 hr capsule Take 1 capsule (75 mg total) by mouth daily. Take along with 150 mg capsule for a total daily dose of 225 mg. 90 capsule 1   No current facility-administered medications for this visit.    Allergies  Allergen Reactions   Nsaids Other (See Comments)    Causes ulcers to bleed   Morphine And Codeine Itching    Family History  Problem Relation Age of Onset   Arthritis Mother    Cancer Mother        breast   Heart failure Mother    Hyperlipidemia Father    Hypertension Father    Stroke Maternal Grandmother     Social History   Socioeconomic History   Marital status: Widowed    Spouse name: Not on file   Number of children: Not on file   Years of education: Not on file   Highest education level: Not on file  Occupational History   Not on file  Tobacco Use   Smoking status: Every Day    Current packs/day: 0.50    Average packs/day: 0.5 packs/day for 40.0 years (20.0 ttl pk-yrs)    Types: Cigarettes   Smokeless tobacco: Never  Vaping  Use   Vaping status: Former  Substance and Sexual Activity   Alcohol use: No   Drug use: No   Sexual activity: Not Currently    Birth control/protection: None  Other Topics Concern   Not on file  Social History Narrative   Not on file   Social Drivers of Health   Financial Resource Strain: Not on file  Food Insecurity: Food Insecurity Present (08/06/2023)   Hunger Vital Sign    Worried About Running Out of Food in the Last Year: Sometimes true    Ran Out of Food in the Last Year: Sometimes true  Transportation Needs: No Transportation Needs (08/06/2023)   PRAPARE - Administrator, Civil Service (Medical): No    Lack of Transportation (Non-Medical): No  Physical Activity: Not on file  Stress: Not on file  Social Connections: Not on file  Intimate Partner Violence: Not At Risk (08/06/2023)   Humiliation, Afraid, Rape, and Kick questionnaire    Fear of Current or  Ex-Partner: No    Emotionally Abused: No    Physically Abused: No    Sexually Abused: No     Constitutional: Denies fever, malaise, fatigue, headache or abrupt weight changes.  HEENT: Denies eye pain, eye redness, ear pain, ringing in the ears, wax buildup, runny nose, nasal congestion, bloody nose, or sore throat. Respiratory: Denies difficulty breathing, shortness of breath, cough or sputum production.   Cardiovascular: Denies chest pain, chest tightness, palpitations or swelling in the hands or feet.  Gastrointestinal: Pt reports intermittent reflux. Denies abdominal pain, bloating, constipation, diarrhea or blood in the stool.  GU: Denies urgency, frequency, pain with urination, burning sensation, blood in urine, odor or discharge. Musculoskeletal: Patient reports chronic joint and muscle pain.  Denies decrease in range of motion, difficulty with gait, or joint swelling.  Skin: Denies redness, rashes, lesions or ulcercations.  Neurological: Patient reports insomnia.  Denies dizziness, difficulty with memory, difficulty with speech or problems with balance and coordination.  Psych: Patient has a history of anxiety and depression.  Denies  SI/HI.  No other specific complaints in a complete review of systems (except as listed in HPI above).      Objective:   Physical Exam  LMP 11/28/2021 (Approximate)   Wt Readings from Last 3 Encounters:  11/06/23 192 lb 9.6 oz (87.4 kg)  09/22/23 200 lb (90.7 kg)  08/06/23 181 lb (82.1 kg)    General: Appears older than her stated age, obese, in NAD. Skin: Warm, dry and intact.  HEENT: Head: normal shape and size; Eyes: sclera white, no icterus, conjunctiva pink, PERRLA and EOMs intact;  Cardiovascular: Normal rate and rhythm. S1,S2 noted.  No murmur, rubs or gallops noted. No JVD or BLE edema. No carotid bruits noted. Pulmonary/Chest: Normal effort and positive vesicular breath sounds. No respiratory distress. No wheezes, rales or ronchi  noted.  Abdomen: Soft and nontender. Normal bowel sounds.  Musculoskeletal: No difficulty with gait.  Neurological: Alert and oriented. Coordination normal.  Psychiatric: Mood and affect normal. Mildly anxious appearing. Judgment and thought content normal.    BMET    Component Value Date/Time   NA 141 11/06/2023 1040   NA 142 12/05/2022 1138   NA 138 09/06/2014 0132   K 3.4 (L) 11/06/2023 1040   K 3.8 09/06/2014 0132   CL 102 11/06/2023 1040   CL 105 09/06/2014 0132   CO2 30 11/06/2023 1040   CO2 27 09/06/2014 0132   GLUCOSE 153 (H) 11/06/2023 1040  GLUCOSE 105 (H) 09/06/2014 0132   BUN 13 11/06/2023 1040   BUN 8 12/05/2022 1138   BUN 8 09/06/2014 0132   CREATININE 0.88 11/06/2023 1040   CALCIUM 9.6 11/06/2023 1040   CALCIUM 9.4 09/06/2014 0132   GFRNONAA >60 08/06/2023 0352   GFRNONAA >60 09/06/2014 0132   GFRNONAA >60 01/21/2014 0531   GFRAA 77 02/19/2020 1452   GFRAA >60 09/06/2014 0132   GFRAA >60 01/21/2014 0531    Lipid Panel     Component Value Date/Time   CHOL 146 11/06/2023 1040   CHOL 120 12/05/2022 1138   CHOL 268 (H) 01/19/2014 1427   TRIG 82 11/06/2023 1040   TRIG 340 (H) 01/19/2014 1427   HDL 46 (L) 11/06/2023 1040   HDL 52 12/05/2022 1138   HDL 33 (L) 01/19/2014 1427   CHOLHDL 3.2 11/06/2023 1040   VLDL UNABLE TO CALCULATE IF TRIGLYCERIDE OVER 400 mg/dL 16/07/9603 5409   VLDL 68 (H) 01/19/2014 1427   LDLCALC 83 11/06/2023 1040   LDLCALC 167 (H) 01/19/2014 1427    CBC    Component Value Date/Time   WBC 11.2 (H) 11/06/2023 1040   RBC 5.04 11/06/2023 1040   HGB 15.0 11/06/2023 1040   HGB 15.5 12/05/2022 1138   HCT 46.0 (H) 11/06/2023 1040   HCT 46.4 12/05/2022 1138   PLT 343 11/06/2023 1040   PLT 346 12/05/2022 1138   MCV 91.3 11/06/2023 1040   MCV 87 12/05/2022 1138   MCV 86 09/06/2014 0132   MCH 29.8 11/06/2023 1040   MCHC 32.6 11/06/2023 1040   RDW 12.4 11/06/2023 1040   RDW 13.1 12/05/2022 1138   RDW 15.1 (H) 09/06/2014 0132    LYMPHSABS 0.9 12/05/2022 1138   LYMPHSABS 2.1 09/06/2014 0132   MONOABS 0.3 11/04/2022 0259   MONOABS 0.9 09/06/2014 0132   EOSABS 0.1 12/05/2022 1138   EOSABS 0.1 09/06/2014 0132   BASOSABS 0.0 12/05/2022 1138   BASOSABS 0.0 09/06/2014 0132    Hgb A1C Lab Results  Component Value Date   HGBA1C 5.9 (H) 11/06/2023            Assessment & Plan:   RTC in 5 months for your annual exam Nicki Reaper, NP

## 2024-01-07 ENCOUNTER — Ambulatory Visit: Admitting: Internal Medicine

## 2024-01-07 NOTE — Progress Notes (Deleted)
 Subjective:    Patient ID: Latasha Lawrence, female    DOB: [birth year], 53 y.o.   MRN: [id number]  HPI  Patient presents to clinic today for follow-up of HTN.  Her BP today is.  She is taking olmesartan-amlodipine-HCTZ as prescribed.  ECG from 07/2023 reviewed.  Review of Systems   Past Medical History:  Diagnosis Date   Abnormal weight gain    Anxiety    Chronic back pain    Controlled substance agreement terminated 07/2014   failed UDS at [facility]   Depression    Fibromyalgia    GERD (gastroesophageal reflux disease)    Headache    Hirsutism    Hypertension    Hypokalemia    Neutrophilic leukocytosis    Periodontal disease    Stomach ulcer    Stroke (HCC)    Vitamin D deficiency disease     Current Outpatient Medications  Medication Sig Dispense Refill   aspirin EC 81 MG tablet Take 1 tablet (81 mg total) by mouth daily. Swallow whole. 30 tablet 12   atorvastatin (LIPITOR) 80 MG tablet Take 1 tablet (80 mg total) by mouth daily at 6 PM. 90 tablet 1   fluticasone (FLONASE) 50 MCG/ACT nasal spray Place 1 spray into both nostrils in the morning and at bedtime. 16 g 5   hydrOXYzine (VISTARIL) 100 MG capsule Take 1 capsule (100 mg total) by mouth 3 (three) times daily as needed. 90 capsule 1   lidocaine 4 % Place 1 patch onto the skin daily. 20 patch 0   Olmesartan-amLODIPine-HCTZ 40-10-25 MG TABS Take 1 tablet by mouth daily at 12 noon. 90 tablet 1   pantoprazole (PROTONIX) 40 MG tablet Take 1 tablet (40 mg total) by mouth daily. 90 tablet 1   traZODone (DESYREL) 100 MG tablet Take 2 tablets (200 mg total) by mouth at bedtime. 180 tablet 1   venlafaxine XR (EFFEXOR-XR) 150 MG 24 hr capsule Take 1 capsule (150 mg total) by mouth daily with breakfast. Take along with 75 mg capsule for a total daily dose of 225 mg. 90 capsule 1   venlafaxine XR (EFFEXOR-XR) 75 MG 24 hr capsule Take 1 capsule (75 mg total) by mouth daily. Take along with 150 mg capsule for a total  daily dose of 225 mg. 90 capsule 1   No current facility-administered medications for this visit.    Allergies  Allergen Reactions   Nsaids Other (See Comments)    Causes ulcers to bleed   Morphine And Codeine Itching    Family History  Problem Relation Age of Onset   Arthritis Mother    Cancer Mother        breast   Heart failure Mother    Hyperlipidemia Father    Hypertension Father    Stroke Maternal Grandmother     Social History   Socioeconomic History   Marital status: Widowed    Spouse name: Not on file   Number of children: Not on file   Years of education: Not on file   Highest education level: Not on file  Occupational History   Not on file  Tobacco Use   Smoking status: Every Day    Current packs/day: 0.50    Average packs/day: 0.5 packs/day for 40.0 years (20.0 ttl pk-yrs)    Types: Cigarettes   Smokeless tobacco: Never  Vaping Use   Vaping status: Former  Substance and Sexual Activity   Alcohol use: No   Drug use: No  Sexual activity: Not Currently    Birth control/protection: None  Other Topics Concern   Not on file  Social History Narrative   Not on file   Social Drivers of Health   Financial Resource Strain: Not on file  Food Insecurity: Food Insecurity Present (08/06/2023)   Hunger Vital Sign    Worried About Running Out of Food in the Last Year: Sometimes true    Ran Out of Food in the Last Year: Sometimes true  Transportation Needs: No Transportation Needs (08/06/2023)   PRAPARE - Administrator, Civil Service (Medical): No    Lack of Transportation (Non-Medical): No  Physical Activity: Not on file  Stress: Not on file  Social Connections: Not on file  Intimate Partner Violence: Not At Risk (08/06/2023)   Humiliation, Afraid, Rape, and Kick questionnaire    Fear of Current or Ex-Partner: No    Emotionally Abused: No    Physically Abused: No    Sexually Abused: No     Constitutional: Denies fever, malaise,  fatigue, headache or abrupt weight changes.  HEENT: Denies eye pain, eye redness, ear pain, ringing in the ears, wax buildup, runny nose, nasal congestion, bloody nose, or sore throat. Respiratory: Denies difficulty breathing, shortness of breath, cough or sputum production.   Cardiovascular: Denies chest pain, chest tightness, palpitations or swelling in the hands or feet.  Gastrointestinal: Pt reports intermittent reflux. Denies abdominal pain, bloating, constipation, diarrhea or blood in the stool.  GU: Denies urgency, frequency, pain with urination, burning sensation, blood in urine, odor or discharge. Musculoskeletal: Patient reports chronic joint and muscle pain.  Denies decrease in range of motion, difficulty with gait, or joint swelling.  Skin: Denies redness, rashes, lesions or ulcercations.  Neurological: Patient reports insomnia.  Denies dizziness, difficulty with memory, difficulty with speech or problems with balance and coordination.  Psych: Patient has a history of anxiety and depression.  Denies  SI/HI.  No other specific complaints in a complete review of systems (except as listed in HPI above).      Objective:   Physical Exam  LMP 11/28/2021 (Approximate)   Wt Readings from Last 3 Encounters:  11/06/23 192 lb 9.6 oz (87.4 kg)  09/22/23 200 lb (90.7 kg)  08/06/23 181 lb (82.1 kg)    General: Appears older than her stated age, obese, in NAD. Skin: Warm, dry and intact.  HEENT: Head: normal shape and size; Eyes: sclera white, no icterus, conjunctiva pink, PERRLA and EOMs intact;  Cardiovascular: Normal rate and rhythm. S1,S2 noted.  No murmur, rubs or gallops noted. No JVD or BLE edema. No carotid bruits noted. Pulmonary/Chest: Normal effort and positive vesicular breath sounds. No respiratory distress. No wheezes, rales or ronchi noted.  Abdomen: Soft and nontender. Normal bowel sounds.  Musculoskeletal: No difficulty with gait.  Neurological: Alert and oriented.  Coordination normal.  Psychiatric: Mood and affect normal. Mildly anxious appearing. Judgment and thought content normal.    BMET    Component Value Date/Time   NA 141 11/06/2023 1040   NA 142 12/05/2022 1138   NA 138 09/06/2014 0132   K 3.4 (L) 11/06/2023 1040   K 3.8 09/06/2014 0132   CL 102 11/06/2023 1040   CL 105 09/06/2014 0132   CO2 30 11/06/2023 1040   CO2 27 09/06/2014 0132   GLUCOSE 153 (H) 11/06/2023 1040   GLUCOSE 105 (H) 09/06/2014 0132   BUN 13 11/06/2023 1040   BUN 8 12/05/2022 1138   BUN 8 09/06/2014  0132   CREATININE 0.88 11/06/2023 1040   CALCIUM 9.6 11/06/2023 1040   CALCIUM 9.4 09/06/2014 0132   GFRNONAA >60 08/06/2023 0352   GFRNONAA >60 09/06/2014 0132   GFRNONAA >60 01/21/2014 0531   GFRAA 77 02/19/2020 1452   GFRAA >60 09/06/2014 0132   GFRAA >60 01/21/2014 0531    Lipid Panel     Component Value Date/Time   CHOL 146 11/06/2023 1040   CHOL 120 12/05/2022 1138   CHOL 268 (H) 01/19/2014 1427   TRIG 82 11/06/2023 1040   TRIG 340 (H) 01/19/2014 1427   HDL 46 (L) 11/06/2023 1040   HDL 52 12/05/2022 1138   HDL 33 (L) 01/19/2014 1427   CHOLHDL 3.2 11/06/2023 1040   VLDL UNABLE TO CALCULATE IF TRIGLYCERIDE OVER 400 mg/dL 91/47/8295 6213   VLDL 68 (H) 01/19/2014 1427   LDLCALC 83 11/06/2023 1040   LDLCALC 167 (H) 01/19/2014 1427    CBC    Component Value Date/Time   WBC 11.2 (H) 11/06/2023 1040   RBC 5.04 11/06/2023 1040   HGB 15.0 11/06/2023 1040   HGB 15.5 12/05/2022 1138   HCT 46.0 (H) 11/06/2023 1040   HCT 46.4 12/05/2022 1138   PLT 343 11/06/2023 1040   PLT 346 12/05/2022 1138   MCV 91.3 11/06/2023 1040   MCV 87 12/05/2022 1138   MCV 86 09/06/2014 0132   MCH 29.8 11/06/2023 1040   MCHC 32.6 11/06/2023 1040   RDW 12.4 11/06/2023 1040   RDW 13.1 12/05/2022 1138   RDW 15.1 (H) 09/06/2014 0132   LYMPHSABS 0.9 12/05/2022 1138   LYMPHSABS 2.1 09/06/2014 0132   MONOABS 0.3 11/04/2022 0259   MONOABS 0.9 09/06/2014 0132   EOSABS 0.1  12/05/2022 1138   EOSABS 0.1 09/06/2014 0132   BASOSABS 0.0 12/05/2022 1138   BASOSABS 0.0 09/06/2014 0132    Hgb A1C Lab Results  Component Value Date   HGBA1C 5.9 (H) 11/06/2023            Assessment & Plan:   RTC in 4 months for your annual exam Nicki Reaper, NP

## 2024-01-11 ENCOUNTER — Ambulatory Visit: Admitting: Internal Medicine

## 2024-01-11 NOTE — Progress Notes (Deleted)
 Subjective:    Patient ID: Latasha Lawrence, female    DOB: [birth year], 53 y.o.   MRN: [id number]  HPI  Patient presents to clinic today for follow-up of HTN.  Her BP today is.  She is taking olmesartan-amlodipine-HCTZ as prescribed.  ECG from 07/2023 reviewed.  Review of Systems   Past Medical History:  Diagnosis Date   Abnormal weight gain    Anxiety    Chronic back pain    Controlled substance agreement terminated 07/2014   failed UDS at [facility]   Depression    Fibromyalgia    GERD (gastroesophageal reflux disease)    Headache    Hirsutism    Hypertension    Hypokalemia    Neutrophilic leukocytosis    Periodontal disease    Stomach ulcer    Stroke (HCC)    Vitamin D deficiency disease     Current Outpatient Medications  Medication Sig Dispense Refill   aspirin EC 81 MG tablet Take 1 tablet (81 mg total) by mouth daily. Swallow whole. 30 tablet 12   atorvastatin (LIPITOR) 80 MG tablet Take 1 tablet (80 mg total) by mouth daily at 6 PM. 90 tablet 1   fluticasone (FLONASE) 50 MCG/ACT nasal spray Place 1 spray into both nostrils in the morning and at bedtime. 16 g 5   hydrOXYzine (VISTARIL) 100 MG capsule Take 1 capsule (100 mg total) by mouth 3 (three) times daily as needed. 90 capsule 1   lidocaine 4 % Place 1 patch onto the skin daily. 20 patch 0   Olmesartan-amLODIPine-HCTZ 40-10-25 MG TABS Take 1 tablet by mouth daily at 12 noon. 90 tablet 1   pantoprazole (PROTONIX) 40 MG tablet Take 1 tablet (40 mg total) by mouth daily. 90 tablet 1   traZODone (DESYREL) 100 MG tablet Take 2 tablets (200 mg total) by mouth at bedtime. 180 tablet 1   venlafaxine XR (EFFEXOR-XR) 150 MG 24 hr capsule Take 1 capsule (150 mg total) by mouth daily with breakfast. Take along with 75 mg capsule for a total daily dose of 225 mg. 90 capsule 1   venlafaxine XR (EFFEXOR-XR) 75 MG 24 hr capsule Take 1 capsule (75 mg total) by mouth daily. Take along with 150 mg capsule for a total  daily dose of 225 mg. 90 capsule 1   No current facility-administered medications for this visit.    Allergies  Allergen Reactions   Nsaids Other (See Comments)    Causes ulcers to bleed   Morphine And Codeine Itching    Family History  Problem Relation Age of Onset   Arthritis Mother    Cancer Mother        breast   Heart failure Mother    Hyperlipidemia Father    Hypertension Father    Stroke Maternal Grandmother     Social History   Socioeconomic History   Marital status: Widowed    Spouse name: Not on file   Number of children: Not on file   Years of education: Not on file   Highest education level: Not on file  Occupational History   Not on file  Tobacco Use   Smoking status: Every Day    Current packs/day: 0.50    Average packs/day: 0.5 packs/day for 40.0 years (20.0 ttl pk-yrs)    Types: Cigarettes   Smokeless tobacco: Never  Vaping Use   Vaping status: Former  Substance and Sexual Activity   Alcohol use: No   Drug use: No  Sexual activity: Not Currently    Birth control/protection: None  Other Topics Concern   Not on file  Social History Narrative   Not on file   Social Drivers of Health   Financial Resource Strain: Not on file  Food Insecurity: Food Insecurity Present (08/06/2023)   Hunger Vital Sign    Worried About Running Out of Food in the Last Year: Sometimes true    Ran Out of Food in the Last Year: Sometimes true  Transportation Needs: No Transportation Needs (08/06/2023)   PRAPARE - Administrator, Civil Service (Medical): No    Lack of Transportation (Non-Medical): No  Physical Activity: Not on file  Stress: Not on file  Social Connections: Not on file  Intimate Partner Violence: Not At Risk (08/06/2023)   Humiliation, Afraid, Rape, and Kick questionnaire    Fear of Current or Ex-Partner: No    Emotionally Abused: No    Physically Abused: No    Sexually Abused: No     Constitutional: Denies fever, malaise,  fatigue, headache or abrupt weight changes.  HEENT: Denies eye pain, eye redness, ear pain, ringing in the ears, wax buildup, runny nose, nasal congestion, bloody nose, or sore throat. Respiratory: Denies difficulty breathing, shortness of breath, cough or sputum production.   Cardiovascular: Denies chest pain, chest tightness, palpitations or swelling in the hands or feet.  Gastrointestinal: Pt reports intermittent reflux. Denies abdominal pain, bloating, constipation, diarrhea or blood in the stool.  GU: Denies urgency, frequency, pain with urination, burning sensation, blood in urine, odor or discharge. Musculoskeletal: Patient reports chronic joint and muscle pain.  Denies decrease in range of motion, difficulty with gait, or joint swelling.  Skin: Denies redness, rashes, lesions or ulcercations.  Neurological: Patient reports insomnia.  Denies dizziness, difficulty with memory, difficulty with speech or problems with balance and coordination.  Psych: Patient has a history of anxiety and depression.  Denies  SI/HI.  No other specific complaints in a complete review of systems (except as listed in HPI above).      Objective:   Physical Exam  LMP 11/28/2021 (Approximate)   Wt Readings from Last 3 Encounters:  11/06/23 192 lb 9.6 oz (87.4 kg)  09/22/23 200 lb (90.7 kg)  08/06/23 181 lb (82.1 kg)    General: Appears older than her stated age, obese, in NAD. Skin: Warm, dry and intact.  HEENT: Head: normal shape and size; Eyes: sclera white, no icterus, conjunctiva pink, PERRLA and EOMs intact;  Cardiovascular: Normal rate and rhythm. S1,S2 noted.  No murmur, rubs or gallops noted. No JVD or BLE edema. No carotid bruits noted. Pulmonary/Chest: Normal effort and positive vesicular breath sounds. No respiratory distress. No wheezes, rales or ronchi noted.  Abdomen: Soft and nontender. Normal bowel sounds.  Musculoskeletal: No difficulty with gait.  Neurological: Alert and oriented.  Coordination normal.  Psychiatric: Mood and affect normal. Mildly anxious appearing. Judgment and thought content normal.    BMET    Component Value Date/Time   NA 141 11/06/2023 1040   NA 142 12/05/2022 1138   NA 138 09/06/2014 0132   K 3.4 (L) 11/06/2023 1040   K 3.8 09/06/2014 0132   CL 102 11/06/2023 1040   CL 105 09/06/2014 0132   CO2 30 11/06/2023 1040   CO2 27 09/06/2014 0132   GLUCOSE 153 (H) 11/06/2023 1040   GLUCOSE 105 (H) 09/06/2014 0132   BUN 13 11/06/2023 1040   BUN 8 12/05/2022 1138   BUN 8 09/06/2014  0132   CREATININE 0.88 11/06/2023 1040   CALCIUM 9.6 11/06/2023 1040   CALCIUM 9.4 09/06/2014 0132   GFRNONAA >60 08/06/2023 0352   GFRNONAA >60 09/06/2014 0132   GFRNONAA >60 01/21/2014 0531   GFRAA 77 02/19/2020 1452   GFRAA >60 09/06/2014 0132   GFRAA >60 01/21/2014 0531    Lipid Panel     Component Value Date/Time   CHOL 146 11/06/2023 1040   CHOL 120 12/05/2022 1138   CHOL 268 (H) 01/19/2014 1427   TRIG 82 11/06/2023 1040   TRIG 340 (H) 01/19/2014 1427   HDL 46 (L) 11/06/2023 1040   HDL 52 12/05/2022 1138   HDL 33 (L) 01/19/2014 1427   CHOLHDL 3.2 11/06/2023 1040   VLDL UNABLE TO CALCULATE IF TRIGLYCERIDE OVER 400 mg/dL 91/47/8295 6213   VLDL 68 (H) 01/19/2014 1427   LDLCALC 83 11/06/2023 1040   LDLCALC 167 (H) 01/19/2014 1427    CBC    Component Value Date/Time   WBC 11.2 (H) 11/06/2023 1040   RBC 5.04 11/06/2023 1040   HGB 15.0 11/06/2023 1040   HGB 15.5 12/05/2022 1138   HCT 46.0 (H) 11/06/2023 1040   HCT 46.4 12/05/2022 1138   PLT 343 11/06/2023 1040   PLT 346 12/05/2022 1138   MCV 91.3 11/06/2023 1040   MCV 87 12/05/2022 1138   MCV 86 09/06/2014 0132   MCH 29.8 11/06/2023 1040   MCHC 32.6 11/06/2023 1040   RDW 12.4 11/06/2023 1040   RDW 13.1 12/05/2022 1138   RDW 15.1 (H) 09/06/2014 0132   LYMPHSABS 0.9 12/05/2022 1138   LYMPHSABS 2.1 09/06/2014 0132   MONOABS 0.3 11/04/2022 0259   MONOABS 0.9 09/06/2014 0132   EOSABS 0.1  12/05/2022 1138   EOSABS 0.1 09/06/2014 0132   BASOSABS 0.0 12/05/2022 1138   BASOSABS 0.0 09/06/2014 0132    Hgb A1C Lab Results  Component Value Date   HGBA1C 5.9 (H) 11/06/2023            Assessment & Plan:   RTC in 4 months for your annual exam Nicki Reaper, NP

## 2024-01-21 ENCOUNTER — Other Ambulatory Visit: Payer: Self-pay | Admitting: Internal Medicine

## 2024-01-21 ENCOUNTER — Telehealth: Payer: Self-pay

## 2024-01-21 ENCOUNTER — Other Ambulatory Visit: Payer: Self-pay

## 2024-01-21 ENCOUNTER — Ambulatory Visit: Payer: Self-pay

## 2024-01-21 ENCOUNTER — Emergency Department
Admission: EM | Admit: 2024-01-21 | Discharge: 2024-01-21 | Disposition: A | Discharge status: Home / Self Care | Attending: Emergency Medicine | Admitting: Emergency Medicine

## 2024-01-21 DIAGNOSIS — F419 Anxiety disorder, unspecified: Secondary | ICD-10-CM | POA: Insufficient documentation

## 2024-01-21 DIAGNOSIS — G4709 Other insomnia: Secondary | ICD-10-CM | POA: Diagnosis not present

## 2024-01-21 DIAGNOSIS — G4700 Insomnia, unspecified: Secondary | ICD-10-CM | POA: Diagnosis present

## 2024-01-21 MED ORDER — TRAZODONE HCL 100 MG PO TABS: 300.0000 mg | ORAL_TABLET | Freq: Every day | ORAL | 0 refills | Status: DC

## 2024-01-21 MED ORDER — LORAZEPAM 1 MG PO TABS
1.0000 mg | ORAL_TABLET | Freq: Once | ORAL | Status: AC
  Administered 2024-01-21: 1 mg via ORAL
  Filled 2024-01-21: qty 1

## 2024-01-21 NOTE — Telephone Encounter (Signed)
 Spoke with patient, confirmed x3 that she is taking 100 mg of trazodone. Informed that she can take up to 300 mg.  She would like us  to notify [facility] of the change.

## 2024-01-21 NOTE — Telephone Encounter (Signed)
 Chief Complaint: lack of sleep  Symptoms: tired, hallucinations Frequency: 5 days no sleep Pertinent Negatives: Patient denies thoughts of self harm or suicide Disposition: [x] ED /[] Urgent Care (no appt availability in office) / [] Appointment(In office/virtual)/ []  [facility]/ [] Home Care/ [x] Refused Recommended Disposition /[] [facility]/ [x]  Follow-up with PCP Additional Notes: pt states that she takes 100mg  trazodone for sleep and it has not been working states that she hasn't had any sleep in 5 days. Pt states that she is tired and has chills and hungry. Pt states that hallucinations started this morning. Denies them being harmful. Disposition ED. Patient refused EMS. States that she will get someone to take her to ED. Pt is wanting an increase in her Trazadone.   Copied from CRM [id number]. Topic: Clinical - Red Word Triage >> Jan 21, 2024  8:03 AM Baldemar Lev wrote: Red Word that prompted transfer to Nurse Triage: Has not slept in 5 days, hallucinating. Reason for Disposition  Seeing, hearing, or feeling things that are not there (i.e., visual, auditory, or tactile hallucinations)  Answer Assessment - Initial Assessment Questions 1. LEVEL OF CONSCIOUSNESS: "How is he (she, the patient) acting right now?" (e.g., alert-oriented, confused, lethargic, stuporous, comatose)     Hearing and seeing things 2. ONSET: "When did the confusion start?"  (minutes, hours, days)     today 3. PATTERN "Does this come and go, or has it been constant since it started?"  "Is it present now?"     intermittent 4. ALCOHOL or DRUGS: "Has he been drinking alcohol or taking any drugs?"      no 5. NARCOTIC MEDICINES: "Has he been receiving any narcotic medications?" (e.g., morphine, Vicodin)     no 6. CAUSE: "What do you think is causing the confusion?"      Lack of sleep 7. OTHER SYMPTOMS: "Are there any other symptoms?" (e.g., difficulty breathing, headache, fever, weakness)      Hallucinations, tired, hungry, hearing things  Protocols used: Confusion - Delirium-A-AH

## 2024-01-21 NOTE — Telephone Encounter (Signed)
 I did not see an ER note in the system. Which ER did they go to? Did they not increase her trazodone there? If she has 50 mg tabs of trazodone, ok to take up to 4 tablets. It she has 100 mg of trazodone, okay to take up to 2 tabs for sleep. She needs to schedule an ER visit for followup up.

## 2024-01-21 NOTE — Telephone Encounter (Signed)
 Note from ED is in now, she takes trazodone 50 mg told her she could take 4 of those, she would like a script sent to the pharmacy notifying them of increase. Also appt made for 01/31/24

## 2024-01-21 NOTE — ED Notes (Signed)
 AVS provided to and discussed with patient. Pt verbalizes understanding of discharge instructions and denies any questions or concerns at this time. Pt has ride home.

## 2024-01-21 NOTE — Addendum Note (Signed)
 Addended by: Carollynn Cirri on: 01/21/2024 03:27 PM   Modules accepted: Orders

## 2024-01-21 NOTE — Telephone Encounter (Signed)
 Copied from CRM [id number]. Topic: Clinical - Medication Question >> Jan 21, 2024  1:39 PM Elle L wrote: Reason for CRM: The patient was following up on her request for traZODone. I attempted to call the office twice as they had attempted to contact the patient but there was no answer. The patient advised that she is currently taking two 100 MG tablets once per day. The patient's call back number is [phone].

## 2024-01-21 NOTE — Telephone Encounter (Signed)
 RX sent to pharmacy

## 2024-01-21 NOTE — Telephone Encounter (Signed)
 Pt needs to go to the ED for further evaluation

## 2024-01-21 NOTE — Telephone Encounter (Signed)
 Tried calling patient no answer or VM.

## 2024-01-21 NOTE — Telephone Encounter (Signed)
 Copied from CRM [id number]. Topic: Clinical - Medication Question >> Jan 21, 2024 12:43 PM [name] wrote: Reason for CRM: traZODone (DESYREL) 100 MG tablet- patient asking if the nurse from triage reached the office about an increase in the dosage so she can get some sleep.  Please call [phone]

## 2024-01-21 NOTE — Telephone Encounter (Signed)
 Copied from CRM [id number]. Topic: Clinical - Medication Question >> Jan 21, 2024  3:21 PM [name] wrote: [phone]- different number to call patient- she is calling again about the increase in dosage of the Trazadone

## 2024-01-21 NOTE — Discharge Instructions (Addendum)
 As we discussed, please pick up Unisom/doxylamine medication over-the-counter to help with your sleep.  If your symptoms worsen, thoughts of harming yourself or others or other concerns then please return to the ED.

## 2024-01-21 NOTE — ED Triage Notes (Signed)
 Pt comes with c/o insomnia. Pt states she hasn't slept in 5 days. Pt normally takes trazodone but not helping now.

## 2024-01-21 NOTE — ED Provider Notes (Signed)
   [facility] Provider Note    Event Date/Time   First MD Initiated Contact with Patient 01/21/24 [phone]     (approximate)   History   Insomnia   HPI  Latasha Lawrence is a 53 y.o. female who presents to the ED for evaluation of Insomnia   Patient presents for evaluation of worsening insomnia and anxiety for the past 5 days.  No thoughts of harming herself or others, no hallucinations.  She is uncertain if she has had any episodes of "micro sleeping" that she describes it.  She presents by POV.  This at home with her mother who she Is care for.  Reports a lot of anxiety.  Continues to use trazodone nightly chronically   Physical Exam   Triage Vital Signs: ED Triage Vitals  Encounter Vitals Group     BP 01/21/24 0858 (!) 148/99     Systolic BP Percentile --      Diastolic BP Percentile --      Pulse Rate 01/21/24 0858 94     Resp 01/21/24 0858 18     Temp 01/21/24 0858 98 F (36.7 C)     Temp src --      SpO2 01/21/24 0858 98 %     Weight 01/21/24 0857 190 lb (86.2 kg)     Height 01/21/24 0857 5\' 6"  (1.676 m)     Head Circumference --      Peak Flow --      Pain Score 01/21/24 0857 5     Pain Loc --      Pain Education --      Exclude from Growth Chart --     Most recent vital signs: Vitals:   01/21/24 0858  BP: (!) 148/99  Pulse: 94  Resp: 18  Temp: 98 F (36.7 C)  SpO2: 98%    General: Awake, no distress.  Somewhat anxious, linear thoughts, pleasant, redirectable CV:  Good peripheral perfusion.  Resp:  Normal effort.  Abd:  No distention.  MSK:  No deformity noted.  Neuro:  No focal deficits appreciated. Other:     ED Results / Procedures / Treatments   Labs (all labs ordered are listed, but only abnormal results are displayed) Labs Reviewed - No data to display  EKG   RADIOLOGY   Official radiology report(s): No results found.  PROCEDURES and INTERVENTIONS:  Procedures  Medications  LORazepam (ATIVAN) tablet 1 mg  (has no administration in time range)     IMPRESSION / MDM / ASSESSMENT AND PLAN / ED COURSE  I reviewed the triage vital signs and the nursing notes.  Differential diagnosis includes, but is not limited to, acute mania, malingering, withdrawals, toxidrome, polysubstance abuse  Patient presents with acute insomnia and anxiety without indications for emergent psychiatric evaluation or IVC.  Looks well to me and without manic features.  We discussed supplementing with OTC sleep aid medication such as doxylamine, PCP follow-up and appropriate ED return precautions.      FINAL CLINICAL IMPRESSION(S) / ED DIAGNOSES   Final diagnoses:  Anxiety  Other insomnia     Rx / DC Orders   ED Discharge Orders     None        Note:  This document was prepared using Dragon voice recognition software and may include unintentional dictation errors.   Delton Prairie, MD 01/21/24 [phone]

## 2024-01-21 NOTE — Telephone Encounter (Signed)
 Spoke with patient, said she went to the ED. Asked again if you could increase the trazodone. Explained you are out of the office.

## 2024-01-21 NOTE — Telephone Encounter (Signed)
 She has 100 mg tabs on her list. And it says she is taking 200 mg at bedtime. She can only take 300 mg of this. We need clarification if she is taking 50 or 100 mg tabs.

## 2024-01-22 ENCOUNTER — Ambulatory Visit: Payer: Self-pay

## 2024-01-22 NOTE — Telephone Encounter (Signed)
 Copied from CRM [id number]. Topic: Clinical - Red Word Triage >> Jan 22, 2024 11:04 AM [place] B wrote: [facility] that prompted transfer to Nurse Triage: pt has severe pain all over, problems sleeping   Chief Complaint: Anxiety   Symptoms: Racing thoughts, difficulty sleeping  Frequency: Constant  Pertinent Negatives: Patient denies suicidal ideations  Disposition: [] ED /[] Urgent Care (no appt availability in office) / [x] Appointment(In office/virtual)/ []  [place] [facility]/ [] Home Care/ [] Refused Recommended Disposition /[] [place] [facility]/ []  Follow-up with PCP Additional Notes: Patient was seen in the ED yesterday for anxiety and difficulty sleeping. She states that her Trazodone was increased to help her sleep but that it not effective. She states that none of her medications appear to be helping with her symptoms. Patient has a hospital follow up for next week but states she would like to be seen sooner due to her symptoms Appointment made for the patient on Thursday for evaluation. Patient instructed to call back for new or worsening symptoms. Patient verbalized understanding and agreement with this plan.      Reason for Disposition  MODERATE anxiety (e.g., persistent or frequent anxiety symptoms; interferes with sleep, school, or work)  Answer Assessment - Initial Assessment Questions 1. CONCERN: "Did anything happen that prompted you to call today?"      Increased anxiety  2. ANXIETY SYMPTOMS: "Can you describe how you (your loved one; patient) have been feeling?" (e.g., tense, restless, panicky, anxious, keyed up, overwhelmed, sense of impending doom).      Racing thoughts  3. ONSET: "How long have you been feeling this way?" (e.g., hours, days, weeks)     1 week 4. SEVERITY: "How would you rate the level of anxiety?" (e.g., 0 - 10; or mild, moderate, severe).     Moderate to severe  5. FUNCTIONAL IMPAIRMENT: "How have these feelings affected your ability to do daily  activities?" "Have you had more difficulty than usual doing your normal daily activities?" (e.g., getting better, same, worse; self-care, school, work, interactions)     Yes 6. HISTORY: "Have you felt this way before?" "Have you ever been diagnosed with an anxiety problem in the past?" (e.g., generalized anxiety disorder, panic attacks, PTSD). If Yes, ask: "How was this problem treated?" (e.g., medicines, counseling, etc.)     Yes, medication  7. RISK OF HARM - SUICIDAL IDEATION: "Do you ever have thoughts of hurting or killing yourself?" If Yes, ask:  "Do you have these feelings now?" "Do you have a plan on how you would do this?"     No 8. TREATMENT:  "What has been done so far to treat this anxiety?" (e.g., medicines, relaxation strategies). "What has helped?"     Medications without relief  9. TREATMENT - THERAPIST: "Do you have a counselor or therapist? Name?"     No 10. POTENTIAL TRIGGERS: "Do you drink caffeinated beverages (e.g., coffee, colas, teas), and how much daily?" "Do you drink alcohol or use any drugs?" "Have you started any new medicines recently?"       Anxiety  11. PATIENT SUPPORT: "Who is with you now?" "Who do you live with?" "Do you have family or friends who you can talk to?"        Yes, but not many  12. OTHER SYMPTOMS: "Do you have any other symptoms?" (e.g., feeling depressed, trouble concentrating, trouble sleeping, trouble breathing, palpitations or fast heartbeat, chest pain, sweating, nausea, or diarrhea)       Trouble sleeping  Protocols used:  Anxiety and Panic Attack-A-AH

## 2024-01-24 ENCOUNTER — Encounter: Payer: Self-pay | Admitting: Family Medicine

## 2024-01-24 ENCOUNTER — Ambulatory Visit (INDEPENDENT_AMBULATORY_CARE_PROVIDER_SITE_OTHER): Admitting: Family Medicine

## 2024-01-24 VITALS — BP 190/100 | HR 107 | Ht 66.0 in | Wt 190.1 lb

## 2024-01-24 DIAGNOSIS — R000 Tachycardia, unspecified: Secondary | ICD-10-CM

## 2024-01-24 DIAGNOSIS — F411 Generalized anxiety disorder: Secondary | ICD-10-CM

## 2024-01-24 DIAGNOSIS — F410 Panic disorder [episodic paroxysmal anxiety] without agoraphobia: Secondary | ICD-10-CM

## 2024-01-24 DIAGNOSIS — F99 Mental disorder, not otherwise specified: Secondary | ICD-10-CM

## 2024-01-24 DIAGNOSIS — F5105 Insomnia due to other mental disorder: Secondary | ICD-10-CM | POA: Diagnosis not present

## 2024-01-24 DIAGNOSIS — I10 Essential (primary) hypertension: Secondary | ICD-10-CM

## 2024-01-24 MED ORDER — ZOLPIDEM TARTRATE 10 MG PO TABS: 10.0000 mg | ORAL_TABLET | Freq: Every evening | ORAL | 0 refills | Status: DC | PRN

## 2024-01-24 MED ORDER — BUSPIRONE HCL 10 MG PO TABS: 10.0000 mg | ORAL_TABLET | Freq: Three times a day (TID) | ORAL | 0 refills | Status: DC | PRN

## 2024-01-24 MED ORDER — CARVEDILOL 12.5 MG PO TABS: 12.5000 mg | ORAL_TABLET | Freq: Two times a day (BID) | ORAL | 0 refills | Status: DC

## 2024-01-24 NOTE — Progress Notes (Signed)
 Subjective:    Patient ID: Latasha Lawrence, female    DOB: [birth year], 53 y.o.   MRN: [id number]  Latasha Lawrence is a 53 y.o. female presenting on 01/24/2024 for Hypertension and Insomnia  Patient presents for a same day appointment.  PCP Nicki Reaper, FNP   HPI  Discussed the use of AI scribe software for clinical note transcription with the patient, who gave verbal consent to proceed.  History of Present Illness   Latasha Lawrence is a 53 year old female with anxiety and insomnia who presents with severe sleep disturbances and elevated blood pressure.  She has experienced severe insomnia, not sleeping for six days, which led to an increase in her trazodone dosage to 300 mg. Despite this, she only managed two hours of sleep per night over the last two nights. She feels like her body is 'falling apart', with difficulty concentrating and thinking. She attributes her sleep issues to anxiety and a busy life, noting that her symptoms worsen with lack of sleep.  Three days ago, she visited the ER at [facility] on 01/21/24 3 days ago, where she was administered Ativan and advised to take [place], neither of which were effective. The Ativan made her slightly sleepy but did not induce sleep. She has had occasional trouble sleeping in the past but not to this extent.  She reports her blood pressure is significantly elevated, recently measured at 200/100, compared to her usual 140/90. She believes this is related to her lack of sleep and anxiety. She experiences body aches, which she attributes to not sleeping, and notes that her blood pressure may rise when she is in pain.  She is currently taking venlafaxine (Effexor) at a total dose of 225 mg daily, which she has been on for two years. She finds it helpful but not for her current sleep problem. She has a history of panic attacks for which she previously took Xanax short term in the past, which was effective. No narcotic use is reported.  She describes a  pounding heart and high heart rate, which she associates with her current symptoms. She has not been diagnosed with bipolar disorder but has a history of anxiety and panic attacks.  Her current blood pressure medication is a combination of olmesartan, amlodipine, and hydrochlorothiazide, though she cannot recall the exact name. She is terrified to lay down at night due to fear of not being able to sleep.  She has tried Ambien in the past, which made her sleepy but did not help her stay asleep. She has also tried Buspar in the past, around 2014 or 2016, but does not recall its effectiveness. She consumes one cup of coffee in the morning to help her get moving after a sleepless night.   Denies any other illicit substance          01/24/2024   10:46 AM 01/09/2023    2:46 PM 12/05/2022   11:24 AM  Depression screen PHQ 2/9  Decreased Interest 3 2 1   Down, Depressed, Hopeless 2 3 1   PHQ - 2 Score 5 5 2   Altered sleeping 3 3 3   Tired, decreased energy 3 3 3   Change in appetite 2 2 2   Feeling bad or failure about yourself  1 3 1   Trouble concentrating 3 3 3   Moving slowly or fidgety/restless 3 2 2   Suicidal thoughts 0 0 0  PHQ-9 Score 20 21 16   Difficult doing work/chores Extremely dIfficult Extremely dIfficult Very difficult  01/24/2024   10:46 AM 01/09/2023    2:46 PM 12/05/2022   11:25 AM 02/13/2022    2:22 PM  GAD 7 : Generalized Anxiety Score  Nervous, Anxious, on Edge 3 3 3 2   Control/stop worrying 2 3 3 1   Worry too much - different things 2 3 3 1   Trouble relaxing 3 3 3 3   Restless 3 1 2 1   Easily annoyed or irritable 3 2 2 2   Afraid - awful might happen 2 2 1 1   Total GAD 7 Score 18 17 17 11   Anxiety Difficulty Extremely difficult Extremely difficult Extremely difficult     Social History   Tobacco Use   Smoking status: Every Day    Current packs/day: 0.50    Average packs/day: 0.5 packs/day for 40.0 years (20.0 ttl pk-yrs)    Types: Cigarettes   Smokeless  tobacco: Never  Vaping Use   Vaping status: Former  Substance Use Topics   Alcohol use: No   Drug use: No    Review of Systems Per HPI unless specifically indicated above     Objective:    BP (!) 190/100 (BP Location: Left Arm, Cuff Size: Normal)   Pulse (!) 107   Ht 5\' 6"  (1.676 m)   Wt 190 lb 2 oz (86.2 kg)   LMP 11/28/2021 (Approximate)   SpO2 98%   BMI 30.69 kg/m   Wt Readings from Last 3 Encounters:  01/24/24 190 lb 2 oz (86.2 kg)  01/21/24 190 lb (86.2 kg)  11/06/23 192 lb 9.6 oz (87.4 kg)    Physical Exam Vitals and nursing note reviewed.  Constitutional:      General: She is not in acute distress.    Appearance: She is well-developed. She is not diaphoretic.     Comments: Well-appearing, tired uncomfortable, cooperative  HENT:     Head: Normocephalic and atraumatic.  Eyes:     General:        Right eye: No discharge.        Left eye: No discharge.     Conjunctiva/sclera: Conjunctivae normal.  Neck:     Thyroid: No thyromegaly.  Cardiovascular:     Rate and Rhythm: Regular rhythm. Tachycardia present.     Heart sounds: Normal heart sounds. No murmur heard. Pulmonary:     Effort: Pulmonary effort is normal. No respiratory distress.     Breath sounds: Normal breath sounds. No wheezing or rales.  Musculoskeletal:        General: Normal range of motion.     Cervical back: Normal range of motion and neck supple.  Lymphadenopathy:     Cervical: No cervical adenopathy.  Skin:    General: Skin is warm and dry.     Findings: No erythema or rash.  Neurological:     Mental Status: She is alert and oriented to person, place, and time.  Psychiatric:        Behavior: Behavior normal.     Comments: Well groomed, good eye contact, normal speech and thoughts. Very anxious.     Results for orders placed or performed in visit on 11/06/23  CBC   Collection Time: 11/06/23 10:40 AM  Result Value Ref Range   WBC 11.2 (H) 3.8 - 10.8 Thousand/uL   RBC 5.04 3.80 -  5.10 Million/uL   Hemoglobin 15.0 11.7 - 15.5 g/dL   HCT 16.1 (H) 09.6 - 04.5 %   MCV 91.3 80.0 - 100.0 fL   MCH 29.8 27.0 - 33.0  pg   MCHC 32.6 32.0 - 36.0 g/dL   RDW 16.1 09.6 - 04.5 %   Platelets 343 140 - 400 Thousand/uL   MPV 9.4 7.5 - 12.5 fL  COMPLETE METABOLIC PANEL WITH GFR   Collection Time: 11/06/23 10:40 AM  Result Value Ref Range   Glucose, Bld 153 (H) 65 - 99 mg/dL   BUN 13 7 - 25 mg/dL   Creat 4.09 8.11 - 9.14 mg/dL   eGFR 80 > OR = 60 NW/GNF/6.21H0   BUN/Creatinine Ratio SEE NOTE: 6 - 22 (calc)   Sodium 141 135 - 146 mmol/L   Potassium 3.4 (L) 3.5 - 5.3 mmol/L   Chloride 102 98 - 110 mmol/L   CO2 30 20 - 32 mmol/L   Calcium 9.6 8.6 - 10.4 mg/dL   Total Protein 6.6 6.1 - 8.1 g/dL   Albumin 4.0 3.6 - 5.1 g/dL   Globulin 2.6 1.9 - 3.7 g/dL (calc)   AG Ratio 1.5 1.0 - 2.5 (calc)   Total Bilirubin 0.3 0.2 - 1.2 mg/dL   Alkaline phosphatase (APISO) 71 37 - 153 U/L   AST 10 10 - 35 U/L   ALT 18 6 - 29 U/L  Lipid panel   Collection Time: 11/06/23 10:40 AM  Result Value Ref Range   Cholesterol 146 <200 mg/dL   HDL 46 (L) > OR = 50 mg/dL   Triglycerides 82 <865 mg/dL   LDL Cholesterol (Calc) 83 mg/dL (calc)   Total CHOL/HDL Ratio 3.2 <5.0 (calc)   Non-HDL Cholesterol (Calc) 100 <130 mg/dL (calc)  Hemoglobin H8I   Collection Time: 11/06/23 10:40 AM  Result Value Ref Range   Hgb A1c MFr Bld 5.9 (H) <5.7 % of total Hgb   Mean Plasma Glucose 123 mg/dL   eAG (mmol/L) 6.8 mmol/L      Assessment & Plan:   Problem List Items Addressed This Visit     Hypertension   Relevant Medications   carvedilol (COREG) 12.5 MG tablet   Insomnia - Primary   Relevant Medications   zolpidem (AMBIEN) 10 MG tablet   Other Visit Diagnoses       Generalized anxiety disorder with panic attacks       Relevant Medications   busPIRone (BUSPAR) 10 MG tablet     Tachycardia            Insomnia Severe insomnia with significant distress and functional impairment. Trazodone  increase ineffective, recently went from 150mg  up to 300mg . Potential serotonin syndrome due to trazodone and venlafaxine combination. Medication adjustment needed. She denies any other substances or other triggers. Small amount of coffee daily only - Reduce trazodone to 200 mg immediately, 3+ days then taper to 100 mg after 3-7 days, then discontinue. - Prescribe Ambien 10 mg immediate release at bedtime AS NEEDED short term for acute severe insomnia  Anxiety Anxiety contributing to insomnia and distress. Panic attacks  Buspar considered for non-benzodiazepine management. Mental health support discussed. - Prescribe Buspar 10 mg up to three times daily for anxiety - Continue venlafaxine 225 mg daily - advised may need to reduce dose as well in future if ineffective, but this is a longstanding med for her. - Provide list of local mental health services. She will need Psychiatry for future management given severity of symptoms. Discussion on question if any history of bipolar, never diagnosed. At this time anxiety is predominant symptom.  Hypertension Severely elevated blood pressure likely exacerbated by insomnia and anxiety. Current regimen includes olmesartan, amlodipine,  and hydrochlorothiazide. Additional medication needed for management given acute elevation. Counseled on severe elevation now concern may warrant immediate lowering. I have recommended STAT labs today, and she has declined due to various reasons including cost. Additionally given severity of all symptoms including HYPERTENSION advised that she would benefit from return to Emergency Dept. She agreed to go only with strict return precautions if not improving or worsening. She will monitor her BP. Goal to treat anxiety and insomnia to improve her blood pressure. - Prescribe carvedilol 12.5 mg twice daily.        No orders of the defined types were placed in this encounter.   Meds ordered this encounter  Medications    busPIRone (BUSPAR) 10 MG tablet    Sig: Take 1 tablet (10 mg total) by mouth 3 (three) times daily as needed.    Dispense:  90 tablet    Refill:  0   zolpidem (AMBIEN) 10 MG tablet    Sig: Take 1 tablet (10 mg total) by mouth at bedtime as needed for sleep.    Dispense:  20 tablet    Refill:  0   carvedilol (COREG) 12.5 MG tablet    Sig: Take 1 tablet (12.5 mg total) by mouth 2 (two) times daily with a meal.    Dispense:  60 tablet    Refill:  0    Follow up plan: Return if symptoms worsen or fail to improve.   Domingo Friend, DO [facility] 01/24/2024, 11:00 AM

## 2024-01-24 NOTE — Patient Instructions (Addendum)
 Thank you for coming to the office today.  For Trazodone Taper down, immediately go to 2 pills instead of 3 After 3-7 days, go down to 1 pill instead of 2. Pause on the 1 pill temporarily for again 5-7 then can go to half tab and eventually off.  For Anxiety Start the Buspar 10mg  up 3 times per day  Continue Venlafaxine  For Blood pressure + Heart rate Add Carvedilol beta blocker 12.5mg  twice a day  For Sleep  Ambien immediate release 10mg  at bedtime prior to sleep.   Check [facility] resources  These offices have both PSYCHIATRY doctors and THERAPISTS  [facility] (Virtual Available) [place] [address] [place], [place] [address] Phone: [phone]  [facility] Address: [address], [place], [place] [address] [email or web address] Phone: [phone]  [facility] Address: [address], [place], [place] [address] Hours: 8:30AM-5PM Phone: [phone]  [facility] (Adult, Peds, Geriatric, Counseling) [address], [address] [place], [place] [address] Phone: [phone] Fax: [phone]  [facility] [address] [place], [place] [address] Phone: [phone]  [facility] (All ages) [address] [place], [id number] Phone: [phone] (Option 1) [email or web address]  ----------------------------------------------------------------- THERAPIST ONLY  (No Psychiatry)  [facility] [address] [place], [place] [address] [place]  P: [phone]  [facility]) [facility], [facility] [address] [place], [place] [address] [phone]  [facility]   Address: [address] [place], [place] [address] Hours: Open today  9AM-7PM Phone: [phone]  [facility] [address] [facility]  - [facility] Address: [address] [place], [place] [address] Phone: [phone]  [facility] [place], [place] [address] Phone: [phone]   Please schedule a Follow-up Appointment to: Return if symptoms worsen or fail to improve.  If you have any other questions or concerns, please feel free to call the office or send a message through MyChart. You may also schedule an earlier appointment if necessary.  Additionally, you may be receiving a survey about your experience at our office within a few days to 1 week by e-mail or mail. We value your feedback.  Domingo Friend, DO [facility], [place]

## 2024-01-30 ENCOUNTER — Emergency Department

## 2024-01-30 ENCOUNTER — Emergency Department
Admission: EM | Admit: 2024-01-30 | Discharge: 2024-01-30 | Disposition: A | Discharge status: Home / Self Care | Attending: Emergency Medicine | Admitting: Emergency Medicine

## 2024-01-30 ENCOUNTER — Other Ambulatory Visit: Payer: Self-pay

## 2024-01-30 DIAGNOSIS — R0789 Other chest pain: Secondary | ICD-10-CM | POA: Insufficient documentation

## 2024-01-30 DIAGNOSIS — E119 Type 2 diabetes mellitus without complications: Secondary | ICD-10-CM | POA: Insufficient documentation

## 2024-01-30 DIAGNOSIS — R079 Chest pain, unspecified: Secondary | ICD-10-CM | POA: Diagnosis present

## 2024-01-30 DIAGNOSIS — I10 Essential (primary) hypertension: Secondary | ICD-10-CM | POA: Insufficient documentation

## 2024-01-30 LAB — CBC
HCT: 51.3 % — ABNORMAL HIGH (ref 36.0–46.0)
Hemoglobin: 17.6 g/dL — ABNORMAL HIGH (ref 12.0–15.0)
MCH: 29.9 pg (ref 26.0–34.0)
MCHC: 34.3 g/dL (ref 30.0–36.0)
MCV: 87.2 fL (ref 80.0–100.0)
Platelets: 388 10*3/uL (ref 150–400)
RBC: 5.88 MIL/uL — ABNORMAL HIGH (ref 3.87–5.11)
RDW: 13 % (ref 11.5–15.5)
WBC: 11 10*3/uL — ABNORMAL HIGH (ref 4.0–10.5)
nRBC: 0 % (ref 0.0–0.2)

## 2024-01-30 LAB — RESP PANEL BY RT-PCR (RSV, FLU A&B, COVID)  RVPGX2
Influenza A by PCR: NEGATIVE
Influenza B by PCR: NEGATIVE
Resp Syncytial Virus by PCR: NEGATIVE
SARS Coronavirus 2 by RT PCR: NEGATIVE

## 2024-01-30 LAB — BASIC METABOLIC PANEL WITH GFR
Anion gap: 10 (ref 5–15)
BUN: 15 mg/dL (ref 6–20)
CO2: 26 mmol/L (ref 22–32)
Calcium: 10.4 mg/dL — ABNORMAL HIGH (ref 8.9–10.3)
Chloride: 101 mmol/L (ref 98–111)
Creatinine, Ser: 0.77 mg/dL (ref 0.44–1.00)
GFR, Estimated: >60 mL/min (ref >60)
Glucose, Bld: 110 mg/dL — ABNORMAL HIGH (ref 70–99)
Potassium: 3.4 mmol/L — ABNORMAL LOW (ref 3.5–5.1)
Sodium: 137 mmol/L (ref 135–145)

## 2024-01-30 LAB — TROPONIN I (HIGH SENSITIVITY)
Troponin I (High Sensitivity): 6 ng/L (ref <18)
Troponin I (High Sensitivity): 6 ng/L (ref <18)

## 2024-01-30 NOTE — ED Triage Notes (Signed)
 Patient comes in from home via [facility] with complaints of chest pain. PT states that she woke up this morning with chest pain, and that the pain has continued to get worse. Pt complains of pain in the center of her chest and rates the pain 7/10. Pt says that the pain radiates up her neck and down both shoulders, and that she also has a headache. Pt is alert and oriented x4 with no signs of acute distress at this time.

## 2024-01-30 NOTE — Discharge Instructions (Signed)
 Continue taking her medications as prescribed.  Follow-up with your primary care provider.  Return to the ER for new, worsening, or persistent severe chest pain, difficulty breathing, weakness, or any other new or worsening symptoms that concern you.

## 2024-01-30 NOTE — ED Triage Notes (Signed)
 First Nurse Note: Patient to eD via [facility] from home for CP. Radiates into right arm from mid chest- dull ache. VS WNL. ETOH on board- states she took a shot to help with the CP.

## 2024-01-30 NOTE — ED Provider Notes (Signed)
 [facility] Provider Note    Event Date/Time   First MD Initiated Contact with Patient 01/30/24 1532     (approximate)   History   Chest Pain   HPI  Latasha Lawrence is a 53 y.o. female with a history of CVA, diabetes, hypertension, and fibromyalgia who presents with insomnia, lightheadedness, and chest pain over the last several days.  The patient states that the insomnia has been a longer-term problem.  She was just taken off of trazodone  and switch to Ambien  by her doctor for the last several days, but it has not helped yet.  She states she is only getting a few hours of sleep at a time.  She also reports feeling somewhat lightheaded and generally unwell, "like my body is falling apart."  She reports some pressure-like chest pain intermittently over the last several days, worse with exertion.  She denies shortness of breath at rest.  She denies cough, vomiting, or diarrhea.  I reviewed the past medical records.  The patient's most recent outpatient encounter was with family medicine on 4/17 for hypertension and insomnia.  She was switched from trazodone  to Ambien .  She was also started on BuSpar  at that time for anxiety, and carvedilol  for blood pressure in addition to her baseline medications.   Physical Exam   Triage Vital Signs: ED Triage Vitals  Encounter Vitals Group     BP 01/30/24 1422 116/77     Systolic BP Percentile --      Diastolic BP Percentile --      Pulse Rate 01/30/24 1422 (!) 101     Resp 01/30/24 1422 19     Temp 01/30/24 1422 99.6 F (37.6 C)     Temp Source 01/30/24 1422 Oral     SpO2 01/30/24 1422 95 %     Weight 01/30/24 1423 185 lb (83.9 kg)     Height 01/30/24 1423 5\' 6"  (1.676 m)     Head Circumference --      Peak Flow --      Pain Score 01/30/24 1423 7     Pain Loc --      Pain Education --      Exclude from Growth Chart --     Most recent vital signs: Vitals:   01/30/24 1422 01/30/24 1651  BP: 116/77 134/81  Pulse:  (!) 101 78  Resp: 19 18  Temp: 99.6 F (37.6 C) 98.1 F (36.7 C)  SpO2: 95% 98%     General: Awake, no distress.  CV:  Good peripheral perfusion.  Normal heart sounds. Resp:  Normal effort.  Lungs CTAB. Abd:  Soft and nontender.  No distention.  Other:  No peripheral edema.   ED Results / Procedures / Treatments   Labs (all labs ordered are listed, but only abnormal results are displayed) Labs Reviewed  BASIC METABOLIC PANEL WITH GFR - Abnormal; Notable for the following components:      Result Value   Potassium 3.4 (*)    Glucose, Bld 110 (*)    Calcium  10.4 (*)    All other components within normal limits  CBC - Abnormal; Notable for the following components:   WBC 11.0 (*)    RBC 5.88 (*)    Hemoglobin 17.6 (*)    HCT 51.3 (*)    All other components within normal limits  RESP PANEL BY RT-PCR (RSV, FLU A&B, COVID)  RVPGX2  TROPONIN I (HIGH SENSITIVITY)  TROPONIN I (HIGH SENSITIVITY)  EKG  ED ECG REPORT I, Lind Repine, the attending physician, personally viewed and interpreted this ECG.  Date: 01/30/2024 EKG Time: 1416 Rate: 103 Rhythm: Sinus tachycardia QRS Axis: normal Intervals: normal ST/T Wave abnormalities: normal Narrative Interpretation: no evidence of acute ischemia    RADIOLOGY  Chest x-ray: I independently viewed and interpreted the images; there is no focal consolidation or edema   PROCEDURES:  Critical Care performed: No  Procedures   MEDICATIONS ORDERED IN ED: Medications - No data to display   IMPRESSION / MDM / ASSESSMENT AND PLAN / ED COURSE  I reviewed the triage vital signs and the nursing notes.  53 year old female with PMH as noted above presents with multiple symptoms which are mostly subacute including insomnia and generalized weakness along with some intermittent chest pain over the last several days.  On exam the patient is well-appearing.  She has a borderline elevated temperature and heart rate with  otherwise normal vital signs.  Physical exam is unremarkable for other acute findings.  EKG is nonischemic.  Chest x-ray shows no acute abnormality.  Differential diagnosis includes, but is not limited to, insomnia, dehydration, electrolyte abnormality, other metabolic cause, musculoskeletal chest pain, acute bronchitis, COVID-19, influenza, other viral syndrome.  I have a low suspicion for ACS or other cardiac cause.  There is no clinical evidence for PE.  BMP shows no acute abnormalities.  CBC shows some hemoconcentration.  Initial troponin is negative.  Will obtain respiratory panel, additional troponin, and reassess.  Patient's presentation is most consistent with acute complicated illness / injury requiring diagnostic workup.  The patient is on the cardiac monitor to evaluate for evidence of arrhythmia and/or significant heart rate changes.   ----------------------------------------- 6:07 PM on 01/30/2024 -----------------------------------------  Repeat troponin and respiratory panel are both negative.  The patient has no active chest pain at this time.  She is stable for discharge home.  I counseled her on the results of the workup.  I gave strict return precautions and she expressed understanding.  She will follow-up with her primary care provider.  FINAL CLINICAL IMPRESSION(S) / ED DIAGNOSES   Final diagnoses:  Atypical chest pain     Rx / DC Orders   ED Discharge Orders     None        Note:  This document was prepared using Dragon voice recognition software and may include unintentional dictation errors.    Lind Repine, MD 01/30/24 [phone]

## 2024-01-31 ENCOUNTER — Ambulatory Visit: Admitting: Internal Medicine

## 2024-01-31 NOTE — Progress Notes (Deleted)
 Subjective:    Patient ID: Latasha Lawrence, female    DOB: December 18, 1970, 53 y.o.   MRN: [id number]  HPI    Review of Systems   Past Medical History:  Diagnosis Date   Abnormal weight gain    Anxiety    Chronic back pain    Controlled substance agreement terminated 07/2014   failed UDS at [facility]   Depression    Fibromyalgia    GERD (gastroesophageal reflux disease)    Headache    Hirsutism    Hypertension    Hypokalemia    Neutrophilic leukocytosis    Periodontal disease    Stomach ulcer    Stroke (HCC)    Vitamin D  deficiency disease     Current Outpatient Medications  Medication Sig Dispense Refill   aspirin  EC 81 MG tablet Take 1 tablet (81 mg total) by mouth daily. Swallow whole. 30 tablet 12   atorvastatin  (LIPITOR ) 80 MG tablet Take 1 tablet (80 mg total) by mouth daily at 6 PM. 90 tablet 1   busPIRone  (BUSPAR ) 10 MG tablet Take 1 tablet (10 mg total) by mouth 3 (three) times daily as needed. 90 tablet 0   carvedilol  (COREG ) 12.5 MG tablet Take 1 tablet (12.5 mg total) by mouth 2 (two) times daily with a meal. 60 tablet 0   fluticasone  (FLONASE ) 50 MCG/ACT nasal spray Place 1 spray into both nostrils in the morning and at bedtime. 16 g 5   hydrOXYzine  (VISTARIL ) 100 MG capsule Take 1 capsule (100 mg total) by mouth 3 (three) times daily as needed. 90 capsule 1   lidocaine  4 % Place 1 patch onto the skin daily. 20 patch 0   Olmesartan -amLODIPine -HCTZ 40-10-25 MG TABS Take 1 tablet by mouth daily at 12 noon. 90 tablet 1   pantoprazole  (PROTONIX ) 40 MG tablet Take 1 tablet (40 mg total) by mouth daily. 90 tablet 1   traZODone  (DESYREL ) 100 MG tablet Take 3 tablets (300 mg total) by mouth at bedtime. 270 tablet 0   venlafaxine  XR (EFFEXOR -XR) 150 MG 24 hr capsule Take 1 capsule (150 mg total) by mouth daily with breakfast. Take along with 75 mg capsule for a total daily dose of 225 mg. 90 capsule 1   venlafaxine  XR (EFFEXOR -XR) 75 MG 24 hr capsule Take 1  capsule (75 mg total) by mouth daily. Take along with 150 mg capsule for a total daily dose of 225 mg. 90 capsule 1   zolpidem  (AMBIEN ) 10 MG tablet Take 1 tablet (10 mg total) by mouth at bedtime as needed for sleep. 20 tablet 0   No current facility-administered medications for this visit.    Allergies  Allergen Reactions   Nsaids Other (See Comments)    Causes ulcers to bleed   Morphine  And Codeine  Itching    Family History  Problem Relation Age of Onset   Arthritis Mother    Cancer Mother        breast   Heart failure Mother    Hyperlipidemia Father    Hypertension Father    Stroke Maternal Grandmother     Social History   Socioeconomic History   Marital status: Widowed    Spouse name: Not on file   Number of children: Not on file   Years of education: Not on file   Highest education level: Not on file  Occupational History   Not on file  Tobacco Use   Smoking status: Every Day    Current  packs/day: 0.50    Average packs/day: 0.5 packs/day for 40.0 years (20.0 ttl pk-yrs)    Types: Cigarettes   Smokeless tobacco: Never  Vaping Use   Vaping status: Former  Substance and Sexual Activity   Alcohol use: No   Drug use: No   Sexual activity: Not Currently    Birth control/protection: None  Other Topics Concern   Not on file  Social History Narrative   Not on file   Social Drivers of Health   Financial Resource Strain: Not on file  Food Insecurity: Food Insecurity Present (08/06/2023)   Hunger Vital Sign    Worried About Running Out of Food in the Last Year: Sometimes true    Ran Out of Food in the Last Year: Sometimes true  Transportation Needs: No Transportation Needs (08/06/2023)   PRAPARE - Administrator, Civil Service (Medical): No    Lack of Transportation (Non-Medical): No  Physical Activity: Not on file  Stress: Not on file  Social Connections: Not on file  Intimate Partner Violence: Not At Risk (08/06/2023)   Humiliation, Afraid,  Rape, and Kick questionnaire    Fear of Current or Ex-Partner: No    Emotionally Abused: No    Physically Abused: No    Sexually Abused: No     Constitutional: Denies fever, malaise, fatigue, headache or abrupt weight changes.  HEENT: Denies eye pain, eye redness, ear pain, ringing in the ears, wax buildup, runny nose, nasal congestion, bloody nose, or sore throat. Respiratory: Denies difficulty breathing, shortness of breath, cough or sputum production.   Cardiovascular: Denies chest pain, chest tightness, palpitations or swelling in the hands or feet.  Gastrointestinal: Pt reports intermittent reflux. Denies abdominal pain, bloating, constipation, diarrhea or blood in the stool.  GU: Denies urgency, frequency, pain with urination, burning sensation, blood in urine, odor or discharge. Musculoskeletal: Patient reports chronic joint and muscle pain.  Denies decrease in range of motion, difficulty with gait, or joint swelling.  Skin: Denies redness, rashes, lesions or ulcercations.  Neurological: Patient reports insomnia.  Denies dizziness, difficulty with memory, difficulty with speech or problems with balance and coordination.  Psych: Patient has a history of anxiety and depression.  Denies  SI/HI.  No other specific complaints in a complete review of systems (except as listed in HPI above).      Objective:   Physical Exam  LMP 11/28/2021 (Approximate)   Wt Readings from Last 3 Encounters:  01/30/24 185 lb (83.9 kg)  01/24/24 190 lb 2 oz (86.2 kg)  01/21/24 190 lb (86.2 kg)    General: Appears older than her stated age, obese, in NAD. Skin: Warm, dry and intact.  HEENT: Head: normal shape and size; Eyes: sclera white, no icterus, conjunctiva pink, PERRLA and EOMs intact;  Cardiovascular: Normal rate and rhythm. S1,S2 noted.  No murmur, rubs or gallops noted. No JVD or BLE edema. No carotid bruits noted. Pulmonary/Chest: Normal effort and positive vesicular breath sounds. No  respiratory distress. No wheezes, rales or ronchi noted.  Abdomen: Soft and nontender. Normal bowel sounds.  Musculoskeletal: No difficulty with gait.  Neurological: Alert and oriented. Coordination normal.  Psychiatric: Mood and affect normal. Mildly anxious appearing. Judgment and thought content normal.    BMET    Component Value Date/Time   NA 137 01/30/2024 1425   NA 142 12/05/2022 1138   NA 138 09/06/2014 0132   K 3.4 (L) 01/30/2024 1425   K 3.8 09/06/2014 0132   CL 101 01/30/2024  1425   CL 105 09/06/2014 0132   CO2 26 01/30/2024 1425   CO2 27 09/06/2014 0132   GLUCOSE 110 (H) 01/30/2024 1425   GLUCOSE 105 (H) 09/06/2014 0132   BUN 15 01/30/2024 1425   BUN 8 12/05/2022 1138   BUN 8 09/06/2014 0132   CREATININE 0.77 01/30/2024 1425   CREATININE 0.88 11/06/2023 1040   CALCIUM  10.4 (H) 01/30/2024 1425   CALCIUM  9.4 09/06/2014 0132   GFRNONAA >60 01/30/2024 1425   GFRNONAA >60 09/06/2014 0132   GFRNONAA >60 01/21/2014 0531   GFRAA 77 02/19/2020 1452   GFRAA >60 09/06/2014 0132   GFRAA >60 01/21/2014 0531    Lipid Panel     Component Value Date/Time   CHOL 146 11/06/2023 1040   CHOL 120 12/05/2022 1138   CHOL 268 (H) 01/19/2014 1427   TRIG 82 11/06/2023 1040   TRIG 340 (H) 01/19/2014 1427   HDL 46 (L) 11/06/2023 1040   HDL 52 12/05/2022 1138   HDL 33 (L) 01/19/2014 1427   CHOLHDL 3.2 11/06/2023 1040   VLDL UNABLE TO CALCULATE IF TRIGLYCERIDE OVER 400 mg/dL 29/56/2130 8657   VLDL 68 (H) 01/19/2014 1427   LDLCALC 83 11/06/2023 1040   LDLCALC 167 (H) 01/19/2014 1427    CBC    Component Value Date/Time   WBC 11.0 (H) 01/30/2024 1425   RBC 5.88 (H) 01/30/2024 1425   HGB 17.6 (H) 01/30/2024 1425   HGB 15.5 12/05/2022 1138   HCT 51.3 (H) 01/30/2024 1425   HCT 46.4 12/05/2022 1138   PLT 388 01/30/2024 1425   PLT 346 12/05/2022 1138   MCV 87.2 01/30/2024 1425   MCV 87 12/05/2022 1138   MCV 86 09/06/2014 0132   MCH 29.9 01/30/2024 1425   MCHC 34.3  01/30/2024 1425   RDW 13.0 01/30/2024 1425   RDW 13.1 12/05/2022 1138   RDW 15.1 (H) 09/06/2014 0132   LYMPHSABS 0.9 12/05/2022 1138   LYMPHSABS 2.1 09/06/2014 0132   MONOABS 0.3 11/04/2022 0259   MONOABS 0.9 09/06/2014 0132   EOSABS 0.1 12/05/2022 1138   EOSABS 0.1 09/06/2014 0132   BASOSABS 0.0 12/05/2022 1138   BASOSABS 0.0 09/06/2014 0132    Hgb A1C Lab Results  Component Value Date   HGBA1C 5.9 (H) 11/06/2023            Assessment & Plan:   RTC in 3 months for your annual exam Helayne Lo, NP

## 2024-02-05 ENCOUNTER — Ambulatory Visit: Admitting: Internal Medicine

## 2024-02-05 NOTE — Progress Notes (Deleted)
 Subjective:    Patient ID: Latasha Lawrence, female    DOB: December 18, 1970, 53 y.o.   MRN: [id number]  HPI    Review of Systems   Past Medical History:  Diagnosis Date   Abnormal weight gain    Anxiety    Chronic back pain    Controlled substance agreement terminated 07/2014   failed UDS at [facility]   Depression    Fibromyalgia    GERD (gastroesophageal reflux disease)    Headache    Hirsutism    Hypertension    Hypokalemia    Neutrophilic leukocytosis    Periodontal disease    Stomach ulcer    Stroke (HCC)    Vitamin D  deficiency disease     Current Outpatient Medications  Medication Sig Dispense Refill   aspirin  EC 81 MG tablet Take 1 tablet (81 mg total) by mouth daily. Swallow whole. 30 tablet 12   atorvastatin  (LIPITOR ) 80 MG tablet Take 1 tablet (80 mg total) by mouth daily at 6 PM. 90 tablet 1   busPIRone  (BUSPAR ) 10 MG tablet Take 1 tablet (10 mg total) by mouth 3 (three) times daily as needed. 90 tablet 0   carvedilol  (COREG ) 12.5 MG tablet Take 1 tablet (12.5 mg total) by mouth 2 (two) times daily with a meal. 60 tablet 0   fluticasone  (FLONASE ) 50 MCG/ACT nasal spray Place 1 spray into both nostrils in the morning and at bedtime. 16 g 5   hydrOXYzine  (VISTARIL ) 100 MG capsule Take 1 capsule (100 mg total) by mouth 3 (three) times daily as needed. 90 capsule 1   lidocaine  4 % Place 1 patch onto the skin daily. 20 patch 0   Olmesartan -amLODIPine -HCTZ 40-10-25 MG TABS Take 1 tablet by mouth daily at 12 noon. 90 tablet 1   pantoprazole  (PROTONIX ) 40 MG tablet Take 1 tablet (40 mg total) by mouth daily. 90 tablet 1   traZODone  (DESYREL ) 100 MG tablet Take 3 tablets (300 mg total) by mouth at bedtime. 270 tablet 0   venlafaxine  XR (EFFEXOR -XR) 150 MG 24 hr capsule Take 1 capsule (150 mg total) by mouth daily with breakfast. Take along with 75 mg capsule for a total daily dose of 225 mg. 90 capsule 1   venlafaxine  XR (EFFEXOR -XR) 75 MG 24 hr capsule Take 1  capsule (75 mg total) by mouth daily. Take along with 150 mg capsule for a total daily dose of 225 mg. 90 capsule 1   zolpidem  (AMBIEN ) 10 MG tablet Take 1 tablet (10 mg total) by mouth at bedtime as needed for sleep. 20 tablet 0   No current facility-administered medications for this visit.    Allergies  Allergen Reactions   Nsaids Other (See Comments)    Causes ulcers to bleed   Morphine  And Codeine  Itching    Family History  Problem Relation Age of Onset   Arthritis Mother    Cancer Mother        breast   Heart failure Mother    Hyperlipidemia Father    Hypertension Father    Stroke Maternal Grandmother     Social History   Socioeconomic History   Marital status: Widowed    Spouse name: Not on file   Number of children: Not on file   Years of education: Not on file   Highest education level: Not on file  Occupational History   Not on file  Tobacco Use   Smoking status: Every Day    Current  packs/day: 0.50    Average packs/day: 0.5 packs/day for 40.0 years (20.0 ttl pk-yrs)    Types: Cigarettes   Smokeless tobacco: Never  Vaping Use   Vaping status: Former  Substance and Sexual Activity   Alcohol use: No   Drug use: No   Sexual activity: Not Currently    Birth control/protection: None  Other Topics Concern   Not on file  Social History Narrative   Not on file   Social Drivers of Health   Financial Resource Strain: Not on file  Food Insecurity: Food Insecurity Present (08/06/2023)   Hunger Vital Sign    Worried About Running Out of Food in the Last Year: Sometimes true    Ran Out of Food in the Last Year: Sometimes true  Transportation Needs: No Transportation Needs (08/06/2023)   PRAPARE - Administrator, Civil Service (Medical): No    Lack of Transportation (Non-Medical): No  Physical Activity: Not on file  Stress: Not on file  Social Connections: Not on file  Intimate Partner Violence: Not At Risk (08/06/2023)   Humiliation, Afraid,  Rape, and Kick questionnaire    Fear of Current or Ex-Partner: No    Emotionally Abused: No    Physically Abused: No    Sexually Abused: No     Constitutional: Denies fever, malaise, fatigue, headache or abrupt weight changes.  HEENT: Denies eye pain, eye redness, ear pain, ringing in the ears, wax buildup, runny nose, nasal congestion, bloody nose, or sore throat. Respiratory: Denies difficulty breathing, shortness of breath, cough or sputum production.   Cardiovascular: Denies chest pain, chest tightness, palpitations or swelling in the hands or feet.  Gastrointestinal: Pt reports intermittent reflux. Denies abdominal pain, bloating, constipation, diarrhea or blood in the stool.  GU: Denies urgency, frequency, pain with urination, burning sensation, blood in urine, odor or discharge. Musculoskeletal: Patient reports chronic joint and muscle pain.  Denies decrease in range of motion, difficulty with gait, or joint swelling.  Skin: Denies redness, rashes, lesions or ulcercations.  Neurological: Patient reports insomnia.  Denies dizziness, difficulty with memory, difficulty with speech or problems with balance and coordination.  Psych: Patient has a history of anxiety and depression.  Denies  SI/HI.  No other specific complaints in a complete review of systems (except as listed in HPI above).      Objective:   Physical Exam  LMP 11/28/2021 (Approximate)   Wt Readings from Last 3 Encounters:  01/30/24 185 lb (83.9 kg)  01/24/24 190 lb 2 oz (86.2 kg)  01/21/24 190 lb (86.2 kg)    General: Appears older than her stated age, obese, in NAD. Skin: Warm, dry and intact.  HEENT: Head: normal shape and size; Eyes: sclera white, no icterus, conjunctiva pink, PERRLA and EOMs intact;  Cardiovascular: Normal rate and rhythm. S1,S2 noted.  No murmur, rubs or gallops noted. No JVD or BLE edema. No carotid bruits noted. Pulmonary/Chest: Normal effort and positive vesicular breath sounds. No  respiratory distress. No wheezes, rales or ronchi noted.  Abdomen: Soft and nontender. Normal bowel sounds.  Musculoskeletal: No difficulty with gait.  Neurological: Alert and oriented. Coordination normal.  Psychiatric: Mood and affect normal. Mildly anxious appearing. Judgment and thought content normal.    BMET    Component Value Date/Time   NA 137 01/30/2024 1425   NA 142 12/05/2022 1138   NA 138 09/06/2014 0132   K 3.4 (L) 01/30/2024 1425   K 3.8 09/06/2014 0132   CL 101 01/30/2024  1425   CL 105 09/06/2014 0132   CO2 26 01/30/2024 1425   CO2 27 09/06/2014 0132   GLUCOSE 110 (H) 01/30/2024 1425   GLUCOSE 105 (H) 09/06/2014 0132   BUN 15 01/30/2024 1425   BUN 8 12/05/2022 1138   BUN 8 09/06/2014 0132   CREATININE 0.77 01/30/2024 1425   CREATININE 0.88 11/06/2023 1040   CALCIUM  10.4 (H) 01/30/2024 1425   CALCIUM  9.4 09/06/2014 0132   GFRNONAA >60 01/30/2024 1425   GFRNONAA >60 09/06/2014 0132   GFRNONAA >60 01/21/2014 0531   GFRAA 77 02/19/2020 1452   GFRAA >60 09/06/2014 0132   GFRAA >60 01/21/2014 0531    Lipid Panel     Component Value Date/Time   CHOL 146 11/06/2023 1040   CHOL 120 12/05/2022 1138   CHOL 268 (H) 01/19/2014 1427   TRIG 82 11/06/2023 1040   TRIG 340 (H) 01/19/2014 1427   HDL 46 (L) 11/06/2023 1040   HDL 52 12/05/2022 1138   HDL 33 (L) 01/19/2014 1427   CHOLHDL 3.2 11/06/2023 1040   VLDL UNABLE TO CALCULATE IF TRIGLYCERIDE OVER 400 mg/dL 29/56/2130 8657   VLDL 68 (H) 01/19/2014 1427   LDLCALC 83 11/06/2023 1040   LDLCALC 167 (H) 01/19/2014 1427    CBC    Component Value Date/Time   WBC 11.0 (H) 01/30/2024 1425   RBC 5.88 (H) 01/30/2024 1425   HGB 17.6 (H) 01/30/2024 1425   HGB 15.5 12/05/2022 1138   HCT 51.3 (H) 01/30/2024 1425   HCT 46.4 12/05/2022 1138   PLT 388 01/30/2024 1425   PLT 346 12/05/2022 1138   MCV 87.2 01/30/2024 1425   MCV 87 12/05/2022 1138   MCV 86 09/06/2014 0132   MCH 29.9 01/30/2024 1425   MCHC 34.3  01/30/2024 1425   RDW 13.0 01/30/2024 1425   RDW 13.1 12/05/2022 1138   RDW 15.1 (H) 09/06/2014 0132   LYMPHSABS 0.9 12/05/2022 1138   LYMPHSABS 2.1 09/06/2014 0132   MONOABS 0.3 11/04/2022 0259   MONOABS 0.9 09/06/2014 0132   EOSABS 0.1 12/05/2022 1138   EOSABS 0.1 09/06/2014 0132   BASOSABS 0.0 12/05/2022 1138   BASOSABS 0.0 09/06/2014 0132    Hgb A1C Lab Results  Component Value Date   HGBA1C 5.9 (H) 11/06/2023            Assessment & Plan:   RTC in 3 months for your annual exam Helayne Lo, NP

## 2024-02-08 ENCOUNTER — Ambulatory Visit: Admitting: Internal Medicine

## 2024-02-08 NOTE — Progress Notes (Deleted)
 Subjective:    Patient ID: Latasha Lawrence, female    DOB: December 18, 1970, 53 y.o.   MRN: [id number]  HPI    Review of Systems   Past Medical History:  Diagnosis Date   Abnormal weight gain    Anxiety    Chronic back pain    Controlled substance agreement terminated 07/2014   failed UDS at [facility]   Depression    Fibromyalgia    GERD (gastroesophageal reflux disease)    Headache    Hirsutism    Hypertension    Hypokalemia    Neutrophilic leukocytosis    Periodontal disease    Stomach ulcer    Stroke (HCC)    Vitamin D  deficiency disease     Current Outpatient Medications  Medication Sig Dispense Refill   aspirin  EC 81 MG tablet Take 1 tablet (81 mg total) by mouth daily. Swallow whole. 30 tablet 12   atorvastatin  (LIPITOR ) 80 MG tablet Take 1 tablet (80 mg total) by mouth daily at 6 PM. 90 tablet 1   busPIRone  (BUSPAR ) 10 MG tablet Take 1 tablet (10 mg total) by mouth 3 (three) times daily as needed. 90 tablet 0   carvedilol  (COREG ) 12.5 MG tablet Take 1 tablet (12.5 mg total) by mouth 2 (two) times daily with a meal. 60 tablet 0   fluticasone  (FLONASE ) 50 MCG/ACT nasal spray Place 1 spray into both nostrils in the morning and at bedtime. 16 g 5   hydrOXYzine  (VISTARIL ) 100 MG capsule Take 1 capsule (100 mg total) by mouth 3 (three) times daily as needed. 90 capsule 1   lidocaine  4 % Place 1 patch onto the skin daily. 20 patch 0   Olmesartan -amLODIPine -HCTZ 40-10-25 MG TABS Take 1 tablet by mouth daily at 12 noon. 90 tablet 1   pantoprazole  (PROTONIX ) 40 MG tablet Take 1 tablet (40 mg total) by mouth daily. 90 tablet 1   traZODone  (DESYREL ) 100 MG tablet Take 3 tablets (300 mg total) by mouth at bedtime. 270 tablet 0   venlafaxine  XR (EFFEXOR -XR) 150 MG 24 hr capsule Take 1 capsule (150 mg total) by mouth daily with breakfast. Take along with 75 mg capsule for a total daily dose of 225 mg. 90 capsule 1   venlafaxine  XR (EFFEXOR -XR) 75 MG 24 hr capsule Take 1  capsule (75 mg total) by mouth daily. Take along with 150 mg capsule for a total daily dose of 225 mg. 90 capsule 1   zolpidem  (AMBIEN ) 10 MG tablet Take 1 tablet (10 mg total) by mouth at bedtime as needed for sleep. 20 tablet 0   No current facility-administered medications for this visit.    Allergies  Allergen Reactions   Nsaids Other (See Comments)    Causes ulcers to bleed   Morphine  And Codeine  Itching    Family History  Problem Relation Age of Onset   Arthritis Mother    Cancer Mother        breast   Heart failure Mother    Hyperlipidemia Father    Hypertension Father    Stroke Maternal Grandmother     Social History   Socioeconomic History   Marital status: Widowed    Spouse name: Not on file   Number of children: Not on file   Years of education: Not on file   Highest education level: Not on file  Occupational History   Not on file  Tobacco Use   Smoking status: Every Day    Current  packs/day: 0.50    Average packs/day: 0.5 packs/day for 40.0 years (20.0 ttl pk-yrs)    Types: Cigarettes   Smokeless tobacco: Never  Vaping Use   Vaping status: Former  Substance and Sexual Activity   Alcohol use: No   Drug use: No   Sexual activity: Not Currently    Birth control/protection: None  Other Topics Concern   Not on file  Social History Narrative   Not on file   Social Drivers of Health   Financial Resource Strain: Not on file  Food Insecurity: Food Insecurity Present (08/06/2023)   Hunger Vital Sign    Worried About Running Out of Food in the Last Year: Sometimes true    Ran Out of Food in the Last Year: Sometimes true  Transportation Needs: No Transportation Needs (08/06/2023)   PRAPARE - Administrator, Civil Service (Medical): No    Lack of Transportation (Non-Medical): No  Physical Activity: Not on file  Stress: Not on file  Social Connections: Not on file  Intimate Partner Violence: Not At Risk (08/06/2023)   Humiliation, Afraid,  Rape, and Kick questionnaire    Fear of Current or Ex-Partner: No    Emotionally Abused: No    Physically Abused: No    Sexually Abused: No     Constitutional: Denies fever, malaise, fatigue, headache or abrupt weight changes.  HEENT: Denies eye pain, eye redness, ear pain, ringing in the ears, wax buildup, runny nose, nasal congestion, bloody nose, or sore throat. Respiratory: Denies difficulty breathing, shortness of breath, cough or sputum production.   Cardiovascular: Denies chest pain, chest tightness, palpitations or swelling in the hands or feet.  Gastrointestinal: Pt reports intermittent reflux. Denies abdominal pain, bloating, constipation, diarrhea or blood in the stool.  GU: Denies urgency, frequency, pain with urination, burning sensation, blood in urine, odor or discharge. Musculoskeletal: Patient reports chronic joint and muscle pain.  Denies decrease in range of motion, difficulty with gait, or joint swelling.  Skin: Denies redness, rashes, lesions or ulcercations.  Neurological: Patient reports insomnia.  Denies dizziness, difficulty with memory, difficulty with speech or problems with balance and coordination.  Psych: Patient has a history of anxiety and depression.  Denies  SI/HI.  No other specific complaints in a complete review of systems (except as listed in HPI above).      Objective:   Physical Exam  LMP 11/28/2021 (Approximate)   Wt Readings from Last 3 Encounters:  01/30/24 185 lb (83.9 kg)  01/24/24 190 lb 2 oz (86.2 kg)  01/21/24 190 lb (86.2 kg)    General: Appears older than her stated age, obese, in NAD. Skin: Warm, dry and intact.  HEENT: Head: normal shape and size; Eyes: sclera white, no icterus, conjunctiva pink, PERRLA and EOMs intact;  Cardiovascular: Normal rate and rhythm. S1,S2 noted.  No murmur, rubs or gallops noted. No JVD or BLE edema. No carotid bruits noted. Pulmonary/Chest: Normal effort and positive vesicular breath sounds. No  respiratory distress. No wheezes, rales or ronchi noted.  Abdomen: Soft and nontender. Normal bowel sounds.  Musculoskeletal: No difficulty with gait.  Neurological: Alert and oriented. Coordination normal.  Psychiatric: Mood and affect normal. Mildly anxious appearing. Judgment and thought content normal.    BMET    Component Value Date/Time   NA 137 01/30/2024 1425   NA 142 12/05/2022 1138   NA 138 09/06/2014 0132   K 3.4 (L) 01/30/2024 1425   K 3.8 09/06/2014 0132   CL 101 01/30/2024  1425   CL 105 09/06/2014 0132   CO2 26 01/30/2024 1425   CO2 27 09/06/2014 0132   GLUCOSE 110 (H) 01/30/2024 1425   GLUCOSE 105 (H) 09/06/2014 0132   BUN 15 01/30/2024 1425   BUN 8 12/05/2022 1138   BUN 8 09/06/2014 0132   CREATININE 0.77 01/30/2024 1425   CREATININE 0.88 11/06/2023 1040   CALCIUM  10.4 (H) 01/30/2024 1425   CALCIUM  9.4 09/06/2014 0132   GFRNONAA >60 01/30/2024 1425   GFRNONAA >60 09/06/2014 0132   GFRNONAA >60 01/21/2014 0531   GFRAA 77 02/19/2020 1452   GFRAA >60 09/06/2014 0132   GFRAA >60 01/21/2014 0531    Lipid Panel     Component Value Date/Time   CHOL 146 11/06/2023 1040   CHOL 120 12/05/2022 1138   CHOL 268 (H) 01/19/2014 1427   TRIG 82 11/06/2023 1040   TRIG 340 (H) 01/19/2014 1427   HDL 46 (L) 11/06/2023 1040   HDL 52 12/05/2022 1138   HDL 33 (L) 01/19/2014 1427   CHOLHDL 3.2 11/06/2023 1040   VLDL UNABLE TO CALCULATE IF TRIGLYCERIDE OVER 400 mg/dL 29/56/2130 8657   VLDL 68 (H) 01/19/2014 1427   LDLCALC 83 11/06/2023 1040   LDLCALC 167 (H) 01/19/2014 1427    CBC    Component Value Date/Time   WBC 11.0 (H) 01/30/2024 1425   RBC 5.88 (H) 01/30/2024 1425   HGB 17.6 (H) 01/30/2024 1425   HGB 15.5 12/05/2022 1138   HCT 51.3 (H) 01/30/2024 1425   HCT 46.4 12/05/2022 1138   PLT 388 01/30/2024 1425   PLT 346 12/05/2022 1138   MCV 87.2 01/30/2024 1425   MCV 87 12/05/2022 1138   MCV 86 09/06/2014 0132   MCH 29.9 01/30/2024 1425   MCHC 34.3  01/30/2024 1425   RDW 13.0 01/30/2024 1425   RDW 13.1 12/05/2022 1138   RDW 15.1 (H) 09/06/2014 0132   LYMPHSABS 0.9 12/05/2022 1138   LYMPHSABS 2.1 09/06/2014 0132   MONOABS 0.3 11/04/2022 0259   MONOABS 0.9 09/06/2014 0132   EOSABS 0.1 12/05/2022 1138   EOSABS 0.1 09/06/2014 0132   BASOSABS 0.0 12/05/2022 1138   BASOSABS 0.0 09/06/2014 0132    Hgb A1C Lab Results  Component Value Date   HGBA1C 5.9 (H) 11/06/2023            Assessment & Plan:   RTC in 3 months for your annual exam Helayne Lo, NP

## 2024-02-14 ENCOUNTER — Encounter: Payer: Self-pay | Admitting: Internal Medicine

## 2024-02-14 ENCOUNTER — Ambulatory Visit (INDEPENDENT_AMBULATORY_CARE_PROVIDER_SITE_OTHER): Admitting: Internal Medicine

## 2024-02-14 VITALS — BP 110/70 | Ht 66.0 in | Wt 203.2 lb

## 2024-02-14 DIAGNOSIS — F419 Anxiety disorder, unspecified: Secondary | ICD-10-CM

## 2024-02-14 DIAGNOSIS — F99 Mental disorder, not otherwise specified: Secondary | ICD-10-CM

## 2024-02-14 DIAGNOSIS — F32A Depression, unspecified: Secondary | ICD-10-CM

## 2024-02-14 DIAGNOSIS — F5105 Insomnia due to other mental disorder: Secondary | ICD-10-CM | POA: Diagnosis not present

## 2024-02-14 DIAGNOSIS — R0683 Snoring: Secondary | ICD-10-CM

## 2024-02-14 MED ORDER — QUETIAPINE FUMARATE 100 MG PO TABS: 100.0000 mg | ORAL_TABLET | Freq: Every day | ORAL | 0 refills | Status: DC

## 2024-02-14 NOTE — Progress Notes (Signed)
 Subjective:    Patient ID: Latasha Lawrence, female    DOB: Feb 12, 1971, 52 y.o.   MRN: [id number]  HPI  Discussed the use of AI scribe software for clinical note transcription with the patient, who gave verbal consent to proceed.  Latasha Lawrence is a 53 year old female who presents with chronic sleep disturbances.  She experiences significant difficulty with sleep, taking hours to fall asleep and only managing about three hours of sleep once she does. The experience of lying awake is described as 'almost painful'. These sleep issues have been present intermittently since college but have worsened recently.  She is currently taking Aambien and trazodone  for sleep, with trazodone  at a dose of 200 mg. She also occasionally uses hydroxyzine  at bedtime. Despite these medications, she continues to experience poor sleep quality. She has not undergone a recent sleep study, with the last one being during her college years.  The lack of sleep is impacting her daily life significantly, making her feel grumpy and affecting her motivation to perform daily activities, such as taking her dog out in the morning. She sometimes resorts to alcohol, taking a shot of whiskey or vodka before bed, and has tried marijuana products marketed as legal for sleep.  In addition to sleep medications, she is on buspirone , hydroxyzine , and venlafaxine  for mood-related issues. She acknowledges feeling very grumpy lately, which she attributes to her sleep problems. She is not currently seeing a therapist or psychiatrist but is open to the idea.  Occasional snoring is reported. No regular recreational drug use, though she has tried marijuana products for sleep.       Review of Systems   Past Medical History:  Diagnosis Date   Abnormal weight gain    Anxiety    Chronic back pain    Controlled substance agreement terminated 07/2014   failed UDS at [facility]   Depression    Fibromyalgia    GERD  (gastroesophageal reflux disease)    Headache    Hirsutism    Hypertension    Hypokalemia    Neutrophilic leukocytosis    Periodontal disease    Stomach ulcer    Stroke (HCC)    Vitamin D  deficiency disease     Current Outpatient Medications  Medication Sig Dispense Refill   aspirin  EC 81 MG tablet Take 1 tablet (81 mg total) by mouth daily. Swallow whole. 30 tablet 12   atorvastatin  (LIPITOR ) 80 MG tablet Take 1 tablet (80 mg total) by mouth daily at 6 PM. 90 tablet 1   busPIRone  (BUSPAR ) 10 MG tablet Take 1 tablet (10 mg total) by mouth 3 (three) times daily as needed. 90 tablet 0   carvedilol  (COREG ) 12.5 MG tablet Take 1 tablet (12.5 mg total) by mouth 2 (two) times daily with a meal. 60 tablet 0   fluticasone  (FLONASE ) 50 MCG/ACT nasal spray Place 1 spray into both nostrils in the morning and at bedtime. 16 g 5   hydrOXYzine  (VISTARIL ) 100 MG capsule Take 1 capsule (100 mg total) by mouth 3 (three) times daily as needed. 90 capsule 1   lidocaine  4 % Place 1 patch onto the skin daily. 20 patch 0   Olmesartan -amLODIPine -HCTZ 40-10-25 MG TABS Take 1 tablet by mouth daily at 12 noon. 90 tablet 1   pantoprazole  (PROTONIX ) 40 MG tablet Take 1 tablet (40 mg total) by mouth daily. 90 tablet 1   traZODone  (DESYREL ) 100 MG tablet Take 3 tablets (300 mg total) by  mouth at bedtime. 270 tablet 0   venlafaxine  XR (EFFEXOR -XR) 150 MG 24 hr capsule Take 1 capsule (150 mg total) by mouth daily with breakfast. Take along with 75 mg capsule for a total daily dose of 225 mg. 90 capsule 1   venlafaxine  XR (EFFEXOR -XR) 75 MG 24 hr capsule Take 1 capsule (75 mg total) by mouth daily. Take along with 150 mg capsule for a total daily dose of 225 mg. 90 capsule 1   zolpidem  (AMBIEN ) 10 MG tablet Take 1 tablet (10 mg total) by mouth at bedtime as needed for sleep. 20 tablet 0   No current facility-administered medications for this visit.    Allergies  Allergen Reactions   Nsaids Other (See Comments)     Causes ulcers to bleed   Morphine  And Codeine  Itching    Family History  Problem Relation Age of Onset   Arthritis Mother    Cancer Mother        breast   Heart failure Mother    Hyperlipidemia Father    Hypertension Father    Stroke Maternal Grandmother     Social History   Socioeconomic History   Marital status: Widowed    Spouse name: Not on file   Number of children: Not on file   Years of education: Not on file   Highest education level: Not on file  Occupational History   Not on file  Tobacco Use   Smoking status: Every Day    Current packs/day: 0.50    Average packs/day: 0.5 packs/day for 40.0 years (20.0 ttl pk-yrs)    Types: Cigarettes   Smokeless tobacco: Never  Vaping Use   Vaping status: Former  Substance and Sexual Activity   Alcohol use: No   Drug use: No   Sexual activity: Not Currently    Birth control/protection: None  Other Topics Concern   Not on file  Social History Narrative   Not on file   Social Drivers of Health   Financial Resource Strain: Not on file  Food Insecurity: Food Insecurity Present (08/06/2023)   Hunger Vital Sign    Worried About Running Out of Food in the Last Year: Sometimes true    Ran Out of Food in the Last Year: Sometimes true  Transportation Needs: No Transportation Needs (08/06/2023)   PRAPARE - Administrator, Civil Service (Medical): No    Lack of Transportation (Non-Medical): No  Physical Activity: Not on file  Stress: Not on file  Social Connections: Not on file  Intimate Partner Violence: Not At Risk (08/06/2023)   Humiliation, Afraid, Rape, and Kick questionnaire    Fear of Current or Ex-Partner: No    Emotionally Abused: No    Physically Abused: No    Sexually Abused: No     Constitutional: Denies fever, malaise, fatigue, headache or abrupt weight changes.  Respiratory: Denies difficulty breathing, shortness of breath, cough or sputum production.   Cardiovascular: Denies chest pain,  chest tightness, palpitations or swelling in the hands or feet.  Musculoskeletal: Patient reports chronic joint and muscle pain.  Denies decrease in range of motion, difficulty with gait, or joint swelling.  Skin: Denies redness, rashes, lesions or ulcercations.  Neurological: Patient reports insomnia.  Denies dizziness, difficulty with memory, difficulty with speech or problems with balance and coordination.  Psych: Patient has a history of anxiety and depression.  Denies  SI/HI.      Objective:   Physical Exam BP 110/70 (BP Location: Left Arm,  Patient Position: Sitting, Cuff Size: Normal)   Ht 5\' 6"  (1.676 m)   Wt 203 lb 3.2 oz (92.2 kg)   LMP 11/28/2021 (Approximate)   BMI 32.80 kg/m   Wt Readings from Last 3 Encounters:  01/30/24 185 lb (83.9 kg)  01/24/24 190 lb 2 oz (86.2 kg)  01/21/24 190 lb (86.2 kg)    General: Appears her stated age, obese, in NAD. Cardiovascular: Normal rate and rhythm. S1,S2 noted.  No murmur, rubs or gallops noted.  Pulmonary/Chest: Normal effort and positive vesicular breath sounds. No respiratory distress. No wheezes, rales or ronchi noted.  Musculoskeletal: No difficulty with gait.  Neurological: Appears under the influence. Awake but can fall asleep quickly. Staring straight ahead while answering questions. Delayed processing time. Coordination normal.  Psychiatric: Mood and affect flat. Judgment and thought content normal.    BMET    Component Value Date/Time   NA 137 01/30/2024 1425   NA 142 12/05/2022 1138   NA 138 09/06/2014 0132   K 3.4 (L) 01/30/2024 1425   K 3.8 09/06/2014 0132   CL 101 01/30/2024 1425   CL 105 09/06/2014 0132   CO2 26 01/30/2024 1425   CO2 27 09/06/2014 0132   GLUCOSE 110 (H) 01/30/2024 1425   GLUCOSE 105 (H) 09/06/2014 0132   BUN 15 01/30/2024 1425   BUN 8 12/05/2022 1138   BUN 8 09/06/2014 0132   CREATININE 0.77 01/30/2024 1425   CREATININE 0.88 11/06/2023 1040   CALCIUM  10.4 (H) 01/30/2024 1425   CALCIUM   9.4 09/06/2014 0132   GFRNONAA >60 01/30/2024 1425   GFRNONAA >60 09/06/2014 0132   GFRNONAA >60 01/21/2014 0531   GFRAA 77 02/19/2020 1452   GFRAA >60 09/06/2014 0132   GFRAA >60 01/21/2014 0531    Lipid Panel     Component Value Date/Time   CHOL 146 11/06/2023 1040   CHOL 120 12/05/2022 1138   CHOL 268 (H) 01/19/2014 1427   TRIG 82 11/06/2023 1040   TRIG 340 (H) 01/19/2014 1427   HDL 46 (L) 11/06/2023 1040   HDL 52 12/05/2022 1138   HDL 33 (L) 01/19/2014 1427   CHOLHDL 3.2 11/06/2023 1040   VLDL UNABLE TO CALCULATE IF TRIGLYCERIDE OVER 400 mg/dL 16/07/9603 5409   VLDL 68 (H) 01/19/2014 1427   LDLCALC 83 11/06/2023 1040   LDLCALC 167 (H) 01/19/2014 1427    CBC    Component Value Date/Time   WBC 11.0 (H) 01/30/2024 1425   RBC 5.88 (H) 01/30/2024 1425   HGB 17.6 (H) 01/30/2024 1425   HGB 15.5 12/05/2022 1138   HCT 51.3 (H) 01/30/2024 1425   HCT 46.4 12/05/2022 1138   PLT 388 01/30/2024 1425   PLT 346 12/05/2022 1138   MCV 87.2 01/30/2024 1425   MCV 87 12/05/2022 1138   MCV 86 09/06/2014 0132   MCH 29.9 01/30/2024 1425   MCHC 34.3 01/30/2024 1425   RDW 13.0 01/30/2024 1425   RDW 13.1 12/05/2022 1138   RDW 15.1 (H) 09/06/2014 0132   LYMPHSABS 0.9 12/05/2022 1138   LYMPHSABS 2.1 09/06/2014 0132   MONOABS 0.3 11/04/2022 0259   MONOABS 0.9 09/06/2014 0132   EOSABS 0.1 12/05/2022 1138   EOSABS 0.1 09/06/2014 0132   BASOSABS 0.0 12/05/2022 1138   BASOSABS 0.0 09/06/2014 0132    Hgb A1C Lab Results  Component Value Date   HGBA1C 5.9 (H) 11/06/2023            Assessment & Plan:   Assessment and Plan  Insomnia, anxiety, depression, snoring Chronic insomnia with poor response to current medications. Significant impact on mood and daily functioning. Decision to adjust medication regimen to improve sleep quality. - Discontinue trazodone . - Initiate Seroquel 100 mg at bedtime. - Continue Ambien  as previously prescribed. - Order home sleep study. -  Refer to psychiatry for further evaluation and management.       RTC in 2 months for your annual exam Helayne Lo, NP

## 2024-02-14 NOTE — Patient Instructions (Signed)
 Insomnia Insomnia is a sleep disorder that makes it difficult to fall asleep or stay asleep. Insomnia can cause fatigue, low energy, difficulty concentrating, mood swings, and poor performance at work or school. There are three different ways to classify insomnia: Difficulty falling asleep. Difficulty staying asleep. Waking up too early in the morning. Any type of insomnia can be long-term (chronic) or short-term (acute). Both are common. Short-term insomnia usually lasts for 3 months or less. Chronic insomnia occurs at least three times a week for longer than 3 months. What are the causes? Insomnia may be caused by another condition, situation, or substance, such as: Having certain mental health conditions, such as anxiety and depression. Using caffeine, alcohol, tobacco, or drugs. Having gastrointestinal conditions, such as gastroesophageal reflux disease (GERD). Having certain medical conditions. These include: Asthma. Alzheimer's disease. Stroke. Chronic pain. An overactive thyroid gland (hyperthyroidism). Other sleep disorders, such as restless legs syndrome and sleep apnea. Menopause. Sometimes, the cause of insomnia may not be known. What increases the risk? Risk factors for insomnia include: Gender. Females are affected more often than males. Age. Insomnia is more common as people get older. Stress and certain medical and mental health conditions. Lack of exercise. Having an irregular work schedule. This may include working night shifts and traveling between different time zones. What are the signs or symptoms? If you have insomnia, the main symptom is having trouble falling asleep or having trouble staying asleep. This may lead to other symptoms, such as: Feeling tired or having low energy. Feeling nervous about going to sleep. Not feeling rested in the morning. Having trouble concentrating. Feeling irritable, anxious, or depressed. How is this diagnosed? This condition  may be diagnosed based on: Your symptoms and medical history. Your health care provider may ask about: Your sleep habits. Any medical conditions you have. Your mental health. A physical exam. How is this treated? Treatment for insomnia depends on the cause. Treatment may focus on treating an underlying condition that is causing the insomnia. Treatment may also include: Medicines to help you sleep. Counseling or therapy. Lifestyle adjustments to help you sleep better. Follow these instructions at home: Eating and drinking  Limit or avoid alcohol, caffeinated beverages, and products that contain nicotine and tobacco, especially close to bedtime. These can disrupt your sleep. Do not eat a large meal or eat spicy foods right before bedtime. This can lead to digestive discomfort that can make it hard for you to sleep. Sleep habits  Keep a sleep diary to help you and your health care provider figure out what could be causing your insomnia. Write down: When you sleep. When you wake up during the night. How well you sleep and how rested you feel the next day. Any side effects of medicines you are taking. What you eat and drink. Make your bedroom a dark, comfortable place where it is easy to fall asleep. Put up shades or blackout curtains to block light from outside. Use a white noise machine to block noise. Keep the temperature cool. Limit screen use before bedtime. This includes: Not watching TV. Not using your smartphone, tablet, or computer. Stick to a routine that includes going to bed and waking up at the same times every day and night. This can help you fall asleep faster. Consider making a quiet activity, such as reading, part of your nighttime routine. Try to avoid taking naps during the day so that you sleep better at night. Get out of bed if you are still awake after  15 minutes of trying to sleep. Keep the lights down, but try reading or doing a quiet activity. When you feel  sleepy, go back to bed. General instructions Take over-the-counter and prescription medicines only as told by your health care provider. Exercise regularly as told by your health care provider. However, avoid exercising in the hours right before bedtime. Use relaxation techniques to manage stress. Ask your health care provider to suggest some techniques that may work well for you. These may include: Breathing exercises. Routines to release muscle tension. Visualizing peaceful scenes. Make sure that you drive carefully. Do not drive if you feel very sleepy. Keep all follow-up visits. This is important. Contact a health care provider if: You are tired throughout the day. You have trouble in your daily routine due to sleepiness. You continue to have sleep problems, or your sleep problems get worse. Get help right away if: You have thoughts about hurting yourself or someone else. Get help right away if you feel like you may hurt yourself or others, or have thoughts about taking your own life. Go to your nearest emergency room or: Call 911. Call the National Suicide Prevention Lifeline at (906)021-1611 or 988. This is open 24 hours a day. Text the Crisis Text Line at 325-069-2793. Summary Insomnia is a sleep disorder that makes it difficult to fall asleep or stay asleep. Insomnia can be long-term (chronic) or short-term (acute). Treatment for insomnia depends on the cause. Treatment may focus on treating an underlying condition that is causing the insomnia. Keep a sleep diary to help you and your health care provider figure out what could be causing your insomnia. This information is not intended to replace advice given to you by your health care provider. Make sure you discuss any questions you have with your health care provider. Document Revised: 09/05/2021 Document Reviewed: 09/05/2021 Elsevier Patient Education  2024 [employer].

## 2024-05-05 ENCOUNTER — Ambulatory Visit: Payer: Self-pay | Admitting: Internal Medicine

## 2024-05-13 ENCOUNTER — Other Ambulatory Visit: Payer: Self-pay | Admitting: Internal Medicine

## 2024-05-13 ENCOUNTER — Other Ambulatory Visit: Payer: Self-pay | Admitting: Family Medicine

## 2024-05-13 DIAGNOSIS — I10 Essential (primary) hypertension: Secondary | ICD-10-CM

## 2024-05-13 DIAGNOSIS — F410 Panic disorder [episodic paroxysmal anxiety] without agoraphobia: Secondary | ICD-10-CM

## 2024-05-13 DIAGNOSIS — F99 Mental disorder, not otherwise specified: Secondary | ICD-10-CM

## 2024-05-15 NOTE — Telephone Encounter (Signed)
 Requested medication (s) are due for refill today: yes  Requested medication (s) are on the active medication list: yes  Last refill:  11/03/23  Future visit scheduled: no  Notes to clinic:  Unable to refill per protocol, cannot delegate.      Requested Prescriptions  Pending Prescriptions Disp Refills   QUEtiapine  (SEROQUEL ) 100 MG tablet [Pharmacy Med Name: QUETIAPINE  FUMARATE 100 MG TAB] 30 tablet 0    Sig: Take 1 tablet (100 mg total) by mouth at bedtime.     Not Delegated - Psychiatry:  Antipsychotics - Second Generation (Atypical) - quetiapine  Failed - 05/15/2024  1:12 PM      Failed - This refill cannot be delegated      Failed - TSH in normal range and within 360 days    TSH  Date Value Ref Range Status  12/05/2022 1.600 0.450 - 4.500 uIU/mL Final         Failed - Lipid Panel in normal range within the last 12 months    Cholesterol, Total  Date Value Ref Range Status  12/05/2022 120 100 - 199 mg/dL Final   Cholesterol  Date Value Ref Range Status  11/06/2023 146 <200 mg/dL Final  95/86/7984 731 (H) 0 - 200 mg/dL Final   Ldl Cholesterol, Calc  Date Value Ref Range Status  01/19/2014 167 (H) 0 - 100 mg/dL Final   LDL Cholesterol (Calc)  Date Value Ref Range Status  11/06/2023 83 mg/dL (calc) Final    Comment:    Reference range: <100 . Desirable range <100 mg/dL for primary prevention;   <70 mg/dL for patients with CHD or diabetic patients  with > or = 2 CHD risk factors. [name] LDL-C is now calculated using the Martin-Hopkins  calculation, which is a validated novel method providing  better accuracy than the Friedewald equation in the  estimation of LDL-C.  Gladis APPLETHWAITE et al. SANDREA. 7986;689(80): 2061-2068  (http://education.[facility].com/faq/FAQ164)    HDL Cholesterol  Date Value Ref Range Status  01/19/2014 33 (L) 40 - 60 mg/dL Final   HDL  Date Value Ref Range Status  11/06/2023 46 (L) > OR = 50 mg/dL Final  97/72/7975 52 >60 mg/dL Final    Triglycerides  Date Value Ref Range Status  11/06/2023 82 <150 mg/dL Final  95/86/7984 659 (H) 0 - 200 mg/dL Final         Failed - CBC within normal limits and completed in the last 12 months    WBC  Date Value Ref Range Status  01/30/2024 11.0 (H) 4.0 - 10.5 K/uL Final   RBC  Date Value Ref Range Status  01/30/2024 5.88 (H) 3.87 - 5.11 MIL/uL Final   Hemoglobin  Date Value Ref Range Status  01/30/2024 17.6 (H) 12.0 - 15.0 g/dL Final  97/72/7975 84.4 11.1 - 15.9 g/dL Final   HCT  Date Value Ref Range Status  01/30/2024 51.3 (H) 36.0 - 46.0 % Final   Hematocrit  Date Value Ref Range Status  12/05/2022 46.4 34.0 - 46.6 % Final   MCHC  Date Value Ref Range Status  01/30/2024 34.3 30.0 - 36.0 g/dL Final   [facility]  Date Value Ref Range Status  01/30/2024 29.9 26.0 - 34.0 pg Final   MCV  Date Value Ref Range Status  01/30/2024 87.2 80.0 - 100.0 fL Final  12/05/2022 87 79 - 97 fL Final  09/06/2014 86 80 - 100 fL Final   No results found for: PLTCOUNTKUC, LABPLAT, POCPLA RDW  Date Value  Ref Range Status  01/30/2024 13.0 11.5 - 15.5 % Final  12/05/2022 13.1 11.7 - 15.4 % Final  09/06/2014 15.1 (H) 11.5 - 14.5 % Final         Failed - CMP within normal limits and completed in the last 12 months    Albumin  Date Value Ref Range Status  08/06/2023 3.9 3.5 - 5.0 g/dL Final  97/72/7975 4.8 3.9 - 4.9 g/dL Final  88/70/7984 4.1 3.4 - 5.0 g/dL Final   Alkaline Phosphatase  Date Value Ref Range Status  08/06/2023 57 38 - 126 U/L Final  09/06/2014 86 Unit/L Final    Comment:    46-116 NOTE: New Reference Range 04/28/14    Alkaline phosphatase (APISO)  Date Value Ref Range Status  11/06/2023 71 37 - 153 U/L Final   ALT  Date Value Ref Range Status  11/06/2023 18 6 - 29 U/L Final   SGPT (ALT)  Date Value Ref Range Status  09/06/2014 21 U/L Final    Comment:    14-63 NOTE: New Reference Range 04/28/14    AST  Date Value Ref Range Status   11/06/2023 10 10 - 35 U/L Final   SGOT(AST)  Date Value Ref Range Status  09/06/2014 < 5 (L) 15 - 37 Unit/L Final   BUN  Date Value Ref Range Status  01/30/2024 15 6 - 20 mg/dL Final  97/72/7975 8 6 - 24 mg/dL Final  88/70/7984 8 7 - 18 mg/dL Final   Calcium   Date Value Ref Range Status  01/30/2024 10.4 (H) 8.9 - 10.3 mg/dL Final   Calcium , Total  Date Value Ref Range Status  09/06/2014 9.4 8.5 - 10.1 mg/dL Final   CO2  Date Value Ref Range Status  01/30/2024 26 22 - 32 mmol/L Final   Co2  Date Value Ref Range Status  09/06/2014 27 21 - 32 mmol/L Final   Creat  Date Value Ref Range Status  11/06/2023 0.88 0.50 - 1.03 mg/dL Final   Creatinine, Ser  Date Value Ref Range Status  01/30/2024 0.77 0.44 - 1.00 mg/dL Final   Glucose  Date Value Ref Range Status  09/06/2014 105 (H) 65 - 99 mg/dL Final   Glucose, Bld  Date Value Ref Range Status  01/30/2024 110 (H) 70 - 99 mg/dL Final    Comment:    Glucose reference range applies only to samples taken after fasting for at least 8 hours.   Potassium  Date Value Ref Range Status  01/30/2024 3.4 (L) 3.5 - 5.1 mmol/L Final  09/06/2014 3.8 3.5 - 5.1 mmol/L Final   Sodium  Date Value Ref Range Status  01/30/2024 137 135 - 145 mmol/L Final  12/05/2022 142 134 - 144 mmol/L Final  09/06/2014 138 136 - 145 mmol/L Final   Total Bilirubin  Date Value Ref Range Status  11/06/2023 0.3 0.2 - 1.2 mg/dL Final   Bilirubin,Total  Date Value Ref Range Status  09/06/2014 0.2 0.2 - 1.0 mg/dL Final   Bilirubin Total  Date Value Ref Range Status  12/05/2022 0.4 0.0 - 1.2 mg/dL Final   Bilirubin, Direct  Date Value Ref Range Status  08/06/2023 0.1 0.0 - 0.2 mg/dL Final   Indirect Bilirubin  Date Value Ref Range Status  08/06/2023 0.9 0.3 - 0.9 mg/dL Final    Comment:    Performed at [facility], [address] [place], [place] [address]   Protein, ur  Date Value Ref Range Status  08/05/2023 NEGATIVE  NEGATIVE mg/dL Final   Total Protein  Date Value Ref Range Status  11/06/2023 6.6 6.1 - 8.1 g/dL Final  97/72/7975 7.7 6.0 - 8.5 g/dL Final  88/70/7984 8.6 (H) 6.4 - 8.2 g/dL Final   EGFR (African American)  Date Value Ref Range Status  09/06/2014 >60 >91mL/min Final  01/21/2014 >60  Final   GFR calc Af Amer  Date Value Ref Range Status  02/19/2020 77 >59 mL/min/1.73 Final    Comment:    **[facility] currently reports eGFR in compliance with the current**   recommendations of the [employer]. [facility] will   update reporting as new guidelines are published from the NKF-ASN   Task force.    eGFR  Date Value Ref Range Status  11/06/2023 80 > OR = 60 mL/min/1.81m2 Final  12/05/2022 63 >59 mL/min/1.73 Final   EGFR (Non-African Amer.)  Date Value Ref Range Status  09/06/2014 >60 >66mL/min Final    Comment:    eGFR values <24mL/min/1.73 m2 may be an indication of chronic kidney disease (CKD). Calculated eGFR, using the MRDR Study equation, is useful in  patients with stable renal function. The eGFR calculation will not be reliable in acutely ill patients when serum creatinine is changing rapidly. It is not useful in patients on dialysis. The eGFR calculation may not be applicable to patients at the low and high extremes of body sizes, pregnant women, and vegetarians.   01/21/2014 >60  Final    Comment:    eGFR values <40mL/min/1.73 m2 may be an indication of chronic kidney disease (CKD). Calculated eGFR is useful in patients with stable renal function. The eGFR calculation will not be reliable in acutely ill patients when serum creatinine is changing rapidly. It is not useful in  patients on dialysis. The eGFR calculation may not be applicable to patients at the low and high extremes of body sizes, pregnant women, and vegetarians. POTASSIUM - Slight hemolysis, interpret results with  - caution.    GFR, Estimated  Date Value Ref Range Status  01/30/2024  >60 >60 mL/min Final    Comment:    (NOTE) Calculated using the CKD-EPI Creatinine Equation (2021)          Passed - Completed PHQ-2 or PHQ-9 in the last 360 days      Passed - Last BP in normal range    BP Readings from Last 1 Encounters:  02/14/24 110/70         Passed - Last Heart Rate in normal range    Pulse Readings from Last 1 Encounters:  01/30/24 87         Passed - Valid encounter within last 6 months    Recent Outpatient Visits           3 months ago Insomnia due to other mental disorder   [facility] [place], [place] W, NP   3 months ago Insomnia due to other mental disorder   [place] [facility] [place], Marsa PARAS, DO              Signed Prescriptions Disp Refills   venlafaxine  XR (EFFEXOR -XR) 75 MG 24 hr capsule 90 capsule 0    Sig: Take 1 capsule (75 mg total) by mouth daily. Take along with 150 mg capsule for a total daily dose of 225 mg.     Psychiatry: Antidepressants - SNRI - desvenlafaxine & venlafaxine  Failed - 05/15/2024  1:12 PM      Failed -  Lipid Panel in normal range within the last 12 months    Cholesterol, Total  Date Value Ref Range Status  12/05/2022 120 100 - 199 mg/dL Final   Cholesterol  Date Value Ref Range Status  11/06/2023 146 <200 mg/dL Final  95/86/7984 731 (H) 0 - 200 mg/dL Final   Ldl Cholesterol, Calc  Date Value Ref Range Status  01/19/2014 167 (H) 0 - 100 mg/dL Final   LDL Cholesterol (Calc)  Date Value Ref Range Status  11/06/2023 83 mg/dL (calc) Final    Comment:    Reference range: <100 . Desirable range <100 mg/dL for primary prevention;   <70 mg/dL for patients with CHD or diabetic patients  with > or = 2 CHD risk factors. [name] LDL-C is now calculated using the Martin-Hopkins  calculation, which is a validated novel method providing  better accuracy than the Friedewald equation in the  estimation of LDL-C.  Gladis APPLETHWAITE et al. SANDREA. 7986;689(80): 2061-2068   (http://education.[facility].com/faq/FAQ164)    HDL Cholesterol  Date Value Ref Range Status  01/19/2014 33 (L) 40 - 60 mg/dL Final   HDL  Date Value Ref Range Status  11/06/2023 46 (L) > OR = 50 mg/dL Final  97/72/7975 52 >60 mg/dL Final   Triglycerides  Date Value Ref Range Status  11/06/2023 82 <150 mg/dL Final  95/86/7984 659 (H) 0 - 200 mg/dL Final         Passed - Cr in normal range and within 360 days    Creat  Date Value Ref Range Status  11/06/2023 0.88 0.50 - 1.03 mg/dL Final   Creatinine, Ser  Date Value Ref Range Status  01/30/2024 0.77 0.44 - 1.00 mg/dL Final         Passed - Completed PHQ-2 or PHQ-9 in the last 360 days      Passed - Last BP in normal range    BP Readings from Last 1 Encounters:  02/14/24 110/70         Passed - Valid encounter within last 6 months    Recent Outpatient Visits           3 months ago Insomnia due to other mental disorder   [place] [facility] [place], Angeline ORN, NP   3 months ago Insomnia due to other mental disorder   [facility] [place], Marsa PARAS, [place]

## 2024-05-15 NOTE — Telephone Encounter (Signed)
 Requested Prescriptions  Pending Prescriptions Disp Refills   carvedilol  (COREG ) 12.5 MG tablet [Pharmacy Med Name: CARVEDILOL  12.5 MG TABLET] 180 tablet 0    Sig: Take 1 tablet (12.5 mg total) by mouth 2 (two) times daily with a meal.     Cardiovascular: Beta Blockers 3 Passed - 05/15/2024  1:13 PM      Passed - Cr in normal range and within 360 days    Creat  Date Value Ref Range Status  11/06/2023 0.88 0.50 - 1.03 mg/dL Final   Creatinine, Ser  Date Value Ref Range Status  01/30/2024 0.77 0.44 - 1.00 mg/dL Final         Passed - AST in normal range and within 360 days    AST  Date Value Ref Range Status  11/06/2023 10 10 - 35 U/L Final   SGOT(AST)  Date Value Ref Range Status  09/06/2014 < 5 (L) 15 - 37 Unit/L Final         Passed - ALT in normal range and within 360 days    ALT  Date Value Ref Range Status  11/06/2023 18 6 - 29 U/L Final   SGPT (ALT)  Date Value Ref Range Status  09/06/2014 21 U/L Final    Comment:    14-63 NOTE: New Reference Range 04/28/14          Passed - Last BP in normal range    BP Readings from Last 1 Encounters:  02/14/24 110/70         Passed - Last Heart Rate in normal range    Pulse Readings from Last 1 Encounters:  01/30/24 87         Passed - Valid encounter within last 6 months    Recent Outpatient Visits           3 months ago Insomnia due to other mental disorder   [facility] [place], Angeline ORN, NP   3 months ago Insomnia due to other mental disorder   [facility] [place], Marsa PARAS, [place]

## 2024-05-15 NOTE — Telephone Encounter (Signed)
 Requested Prescriptions  Pending Prescriptions Disp Refills   busPIRone  (BUSPAR ) 10 MG tablet [Pharmacy Med Name: BUSPIRONE  HCL 10 MG TABLET] 90 tablet 0    Sig: Take 1 tablet (10 mg total) by mouth 3 (three) times daily as needed.     Psychiatry: Anxiolytics/Hypnotics - Non-controlled Passed - 05/15/2024  1:17 PM      Passed - Valid encounter within last 12 months    Recent Outpatient Visits           3 months ago Insomnia due to other mental disorder   [place] [facility] [place], [place] W, NP   3 months ago Insomnia due to other mental disorder   [facility] [place], Marsa PARAS, DO               zolpidem  (AMBIEN ) 10 MG tablet [Pharmacy Med Name: ZOLPIDEM  TARTRATE 10 MG TABLET] 20 tablet     Sig: Take 1 tablet (10 mg total) by mouth at bedtime as needed for sleep.     Not Delegated - Psychiatry:  Anxiolytics/Hypnotics Failed - 05/15/2024  1:17 PM      Failed - This refill cannot be delegated      Passed - Urine Drug Screen completed in last 360 days      Passed - Valid encounter within last 6 months    Recent Outpatient Visits           3 months ago Insomnia due to other mental disorder   [place] [facility] [place], [place] W, NP   3 months ago Insomnia due to other mental disorder   [place] [facility] [place], Marsa PARAS, [place]

## 2024-05-15 NOTE — Telephone Encounter (Signed)
 Requested Prescriptions  Pending Prescriptions Disp Refills   venlafaxine  XR (EFFEXOR -XR) 75 MG 24 hr capsule [Pharmacy Med Name: VENLAFAXINE  HCL ER 75 MG CAP] 90 capsule 0    Sig: Take 1 capsule (75 mg total) by mouth daily. Take along with 150 mg capsule for a total daily dose of 225 mg.     Psychiatry: Antidepressants - SNRI - desvenlafaxine & venlafaxine  Failed - 05/15/2024  1:11 PM      Failed - Lipid Panel in normal range within the last 12 months    Cholesterol, Total  Date Value Ref Range Status  12/05/2022 120 100 - 199 mg/dL Final   Cholesterol  Date Value Ref Range Status  11/06/2023 146 <200 mg/dL Final  95/86/7984 731 (H) 0 - 200 mg/dL Final   Ldl Cholesterol, Calc  Date Value Ref Range Status  01/19/2014 167 (H) 0 - 100 mg/dL Final   LDL Cholesterol (Calc)  Date Value Ref Range Status  11/06/2023 83 mg/dL (calc) Final    Comment:    Reference range: <100 . Desirable range <100 mg/dL for primary prevention;   <70 mg/dL for patients with CHD or diabetic patients  with > or = 2 CHD risk factors. [name] LDL-C is now calculated using the Martin-Hopkins  calculation, which is a validated novel method providing  better accuracy than the Friedewald equation in the  estimation of LDL-C.  Gladis APPLETHWAITE et al. SANDREA. 7986;689(80): 2061-2068  (http://education.[facility].com/faq/FAQ164)    HDL Cholesterol  Date Value Ref Range Status  01/19/2014 33 (L) 40 - 60 mg/dL Final   HDL  Date Value Ref Range Status  11/06/2023 46 (L) > OR = 50 mg/dL Final  97/72/7975 52 >60 mg/dL Final   Triglycerides  Date Value Ref Range Status  11/06/2023 82 <150 mg/dL Final  95/86/7984 659 (H) 0 - 200 mg/dL Final         Passed - Cr in normal range and within 360 days    Creat  Date Value Ref Range Status  11/06/2023 0.88 0.50 - 1.03 mg/dL Final   Creatinine, Ser  Date Value Ref Range Status  01/30/2024 0.77 0.44 - 1.00 mg/dL Final         Passed - Completed PHQ-2 or PHQ-9 in  the last 360 days      Passed - Last BP in normal range    BP Readings from Last 1 Encounters:  02/14/24 110/70         Passed - Valid encounter within last 6 months    Recent Outpatient Visits           3 months ago Insomnia due to other mental disorder   [facility] [place], Angeline ORN, NP   3 months ago Insomnia due to other mental disorder    [facility] [place], Marsa PARAS, DO               QUEtiapine  (SEROQUEL ) 100 MG tablet [Pharmacy Med Name: QUETIAPINE  FUMARATE 100 MG TAB] 30 tablet 0    Sig: Take 1 tablet (100 mg total) by mouth at bedtime.     Not Delegated - Psychiatry:  Antipsychotics - Second Generation (Atypical) - quetiapine  Failed - 05/15/2024  1:11 PM      Failed - This refill cannot be delegated      Failed - TSH in normal range and within 360 days    TSH  Date Value Ref Range Status  12/05/2022 1.600 0.450 -  4.500 uIU/mL Final         Failed - Lipid Panel in normal range within the last 12 months    Cholesterol, Total  Date Value Ref Range Status  12/05/2022 120 100 - 199 mg/dL Final   Cholesterol  Date Value Ref Range Status  11/06/2023 146 <200 mg/dL Final  95/86/7984 731 (H) 0 - 200 mg/dL Final   Ldl Cholesterol, Calc  Date Value Ref Range Status  01/19/2014 167 (H) 0 - 100 mg/dL Final   LDL Cholesterol (Calc)  Date Value Ref Range Status  11/06/2023 83 mg/dL (calc) Final    Comment:    Reference range: <100 . Desirable range <100 mg/dL for primary prevention;   <70 mg/dL for patients with CHD or diabetic patients  with > or = 2 CHD risk factors. [name] LDL-C is now calculated using the Martin-Hopkins  calculation, which is a validated novel method providing  better accuracy than the Friedewald equation in the  estimation of LDL-C.  Gladis APPLETHWAITE et al. SANDREA. 7986;689(80): 2061-2068  (http://education.[facility].com/faq/FAQ164)    HDL Cholesterol  Date Value Ref Range Status   01/19/2014 33 (L) 40 - 60 mg/dL Final   HDL  Date Value Ref Range Status  11/06/2023 46 (L) > OR = 50 mg/dL Final  97/72/7975 52 >60 mg/dL Final   Triglycerides  Date Value Ref Range Status  11/06/2023 82 <150 mg/dL Final  95/86/7984 659 (H) 0 - 200 mg/dL Final         Failed - CBC within normal limits and completed in the last 12 months    WBC  Date Value Ref Range Status  01/30/2024 11.0 (H) 4.0 - 10.5 K/uL Final   RBC  Date Value Ref Range Status  01/30/2024 5.88 (H) 3.87 - 5.11 MIL/uL Final   Hemoglobin  Date Value Ref Range Status  01/30/2024 17.6 (H) 12.0 - 15.0 g/dL Final  97/72/7975 84.4 11.1 - 15.9 g/dL Final   HCT  Date Value Ref Range Status  01/30/2024 51.3 (H) 36.0 - 46.0 % Final   Hematocrit  Date Value Ref Range Status  12/05/2022 46.4 34.0 - 46.6 % Final   MCHC  Date Value Ref Range Status  01/30/2024 34.3 30.0 - 36.0 g/dL Final   [facility]  Date Value Ref Range Status  01/30/2024 29.9 26.0 - 34.0 pg Final   MCV  Date Value Ref Range Status  01/30/2024 87.2 80.0 - 100.0 fL Final  12/05/2022 87 79 - 97 fL Final  09/06/2014 86 80 - 100 fL Final   No results found for: PLTCOUNTKUC, LABPLAT, POCPLA RDW  Date Value Ref Range Status  01/30/2024 13.0 11.5 - 15.5 % Final  12/05/2022 13.1 11.7 - 15.4 % Final  09/06/2014 15.1 (H) 11.5 - 14.5 % Final         Failed - CMP within normal limits and completed in the last 12 months    Albumin  Date Value Ref Range Status  08/06/2023 3.9 3.5 - 5.0 g/dL Final  97/72/7975 4.8 3.9 - 4.9 g/dL Final  88/70/7984 4.1 3.4 - 5.0 g/dL Final   Alkaline Phosphatase  Date Value Ref Range Status  08/06/2023 57 38 - 126 U/L Final  09/06/2014 86 Unit/L Final    Comment:    46-116 NOTE: New Reference Range 04/28/14    Alkaline phosphatase (APISO)  Date Value Ref Range Status  11/06/2023 71 37 - 153 U/L Final   ALT  Date Value Ref Range Status  11/06/2023  18 6 - 29 U/L Final   SGPT (ALT)  Date Value  Ref Range Status  09/06/2014 21 U/L Final    Comment:    14-63 NOTE: New Reference Range 04/28/14    AST  Date Value Ref Range Status  11/06/2023 10 10 - 35 U/L Final   SGOT(AST)  Date Value Ref Range Status  09/06/2014 < 5 (L) 15 - 37 Unit/L Final   BUN  Date Value Ref Range Status  01/30/2024 15 6 - 20 mg/dL Final  97/72/7975 8 6 - 24 mg/dL Final  88/70/7984 8 7 - 18 mg/dL Final   Calcium   Date Value Ref Range Status  01/30/2024 10.4 (H) 8.9 - 10.3 mg/dL Final   Calcium , Total  Date Value Ref Range Status  09/06/2014 9.4 8.5 - 10.1 mg/dL Final   CO2  Date Value Ref Range Status  01/30/2024 26 22 - 32 mmol/L Final   Co2  Date Value Ref Range Status  09/06/2014 27 21 - 32 mmol/L Final   Creat  Date Value Ref Range Status  11/06/2023 0.88 0.50 - 1.03 mg/dL Final   Creatinine, Ser  Date Value Ref Range Status  01/30/2024 0.77 0.44 - 1.00 mg/dL Final   Glucose  Date Value Ref Range Status  09/06/2014 105 (H) 65 - 99 mg/dL Final   Glucose, Bld  Date Value Ref Range Status  01/30/2024 110 (H) 70 - 99 mg/dL Final    Comment:    Glucose reference range applies only to samples taken after fasting for at least 8 hours.   Potassium  Date Value Ref Range Status  01/30/2024 3.4 (L) 3.5 - 5.1 mmol/L Final  09/06/2014 3.8 3.5 - 5.1 mmol/L Final   Sodium  Date Value Ref Range Status  01/30/2024 137 135 - 145 mmol/L Final  12/05/2022 142 134 - 144 mmol/L Final  09/06/2014 138 136 - 145 mmol/L Final   Total Bilirubin  Date Value Ref Range Status  11/06/2023 0.3 0.2 - 1.2 mg/dL Final   Bilirubin,Total  Date Value Ref Range Status  09/06/2014 0.2 0.2 - 1.0 mg/dL Final   Bilirubin Total  Date Value Ref Range Status  12/05/2022 0.4 0.0 - 1.2 mg/dL Final   Bilirubin, Direct  Date Value Ref Range Status  08/06/2023 0.1 0.0 - 0.2 mg/dL Final   Indirect Bilirubin  Date Value Ref Range Status  08/06/2023 0.9 0.3 - 0.9 mg/dL Final    Comment:    Performed  at [facility], [address] [place], [place] [address]   Protein, ur  Date Value Ref Range Status  08/05/2023 NEGATIVE NEGATIVE mg/dL Final   Total Protein  Date Value Ref Range Status  11/06/2023 6.6 6.1 - 8.1 g/dL Final  97/72/7975 7.7 6.0 - 8.5 g/dL Final  88/70/7984 8.6 (H) 6.4 - 8.2 g/dL Final   EGFR (African American)  Date Value Ref Range Status  09/06/2014 >60 >2mL/min Final  01/21/2014 >60  Final   GFR calc Af Amer  Date Value Ref Range Status  02/19/2020 77 >59 mL/min/1.73 Final    Comment:    **[facility] currently reports eGFR in compliance with the current**   recommendations of the [employer]. [facility] will   update reporting as new guidelines are published from the NKF-ASN   Task force.    eGFR  Date Value Ref Range Status  11/06/2023 80 > OR = 60 mL/min/1.80m2 Final  12/05/2022 63 >59 mL/min/1.73 Final   EGFR (Non-African Amer.)  Date Value Ref Range Status  09/06/2014 >60 >33mL/min Final    Comment:    eGFR values <49mL/min/1.73 m2 may be an indication of chronic kidney disease (CKD). Calculated eGFR, using the MRDR Study equation, is useful in  patients with stable renal function. The eGFR calculation will not be reliable in acutely ill patients when serum creatinine is changing rapidly. It is not useful in patients on dialysis. The eGFR calculation may not be applicable to patients at the low and high extremes of body sizes, pregnant women, and vegetarians.   01/21/2014 >60  Final    Comment:    eGFR values <85mL/min/1.73 m2 may be an indication of chronic kidney disease (CKD). Calculated eGFR is useful in patients with stable renal function. The eGFR calculation will not be reliable in acutely ill patients when serum creatinine is changing rapidly. It is not useful in  patients on dialysis. The eGFR calculation may not be applicable to patients at the low and high extremes of body sizes, pregnant women, and  vegetarians. POTASSIUM - Slight hemolysis, interpret results with  - caution.    GFR, Estimated  Date Value Ref Range Status  01/30/2024 >60 >60 mL/min Final    Comment:    (NOTE) Calculated using the CKD-EPI Creatinine Equation (2021)          Passed - Completed PHQ-2 or PHQ-9 in the last 360 days      Passed - Last BP in normal range    BP Readings from Last 1 Encounters:  02/14/24 110/70         Passed - Last Heart Rate in normal range    Pulse Readings from Last 1 Encounters:  01/30/24 87         Passed - Valid encounter within last 6 months    Recent Outpatient Visits           3 months ago Insomnia due to other mental disorder   [facility] [place], [place] W, NP   3 months ago Insomnia due to other mental disorder   [facility] [place], Marsa PARAS, [place]

## 2024-05-15 NOTE — Telephone Encounter (Signed)
 Requested medication (s) are due for refill today: yes  Requested medication (s) are on the active medication list: yes  Last refill:  01/24/24 #20 tabs  Future visit scheduled: no  Notes to clinic:  Rx ended: 02/23/24   Requested Prescriptions  Pending Prescriptions Disp Refills   zolpidem  (AMBIEN ) 10 MG tablet [Pharmacy Med Name: ZOLPIDEM  TARTRATE 10 MG TABLET] 20 tablet     Sig: Take 1 tablet (10 mg total) by mouth at bedtime as needed for sleep.     Not Delegated - Psychiatry:  Anxiolytics/Hypnotics Failed - 05/15/2024  1:18 PM      Failed - This refill cannot be delegated      Passed - Urine Drug Screen completed in last 360 days      Passed - Valid encounter within last 6 months    Recent Outpatient Visits           3 months ago Insomnia due to other mental disorder   [facility] [place], [place] W, NP   3 months ago Insomnia due to other mental disorder   [facility] [place], Marsa PARAS, DO              Signed Prescriptions Disp Refills   busPIRone  (BUSPAR ) 10 MG tablet 90 tablet 0    Sig: Take 1 tablet (10 mg total) by mouth 3 (three) times daily as needed.     Psychiatry: Anxiolytics/Hypnotics - Non-controlled Passed - 05/15/2024  1:18 PM      Passed - Valid encounter within last 12 months    Recent Outpatient Visits           3 months ago Insomnia due to other mental disorder   [place] [facility] [facility] [place], [place] W, NP   3 months ago Insomnia due to other mental disorder   [facility] [place], Marsa PARAS, [place]

## 2024-06-03 ENCOUNTER — Other Ambulatory Visit: Payer: Self-pay | Admitting: Internal Medicine

## 2024-06-05 ENCOUNTER — Ambulatory Visit: Payer: Self-pay

## 2024-06-05 NOTE — Telephone Encounter (Signed)
 Requested Prescriptions  Refused Prescriptions Disp Refills   traZODone  (DESYREL ) 100 MG tablet [Pharmacy Med Name: TRAZODONE  100 MG TABLET] 180 tablet 0    Sig: Take 2 tablets (200 mg total) by mouth at bedtime.     Psychiatry: Antidepressants - Serotonin Modulator Passed - 06/05/2024 12:48 PM      Passed - Completed PHQ-2 or PHQ-9 in the last 360 days      Passed - Valid encounter within last 6 months    Recent Outpatient Visits           3 months ago Insomnia due to other mental disorder   [place] [facility] [place], [place] W, NP   4 months ago Insomnia due to other mental disorder   [facility] [place], [place]a PARAS, [place]

## 2024-06-05 NOTE — Telephone Encounter (Signed)
 FYI Only or Action Required?: Action required by provider: clinical question for provider and update on patient condition.  Patient was last seen in primary care on 02/14/2024 by Latasha Lawrence ORN, NP.  Called Nurse Triage reporting Medication Problem.  This RN attempted to contact patient for triage. No answer, voicemail left requesting return call to clinic.   Copied from CRM [id number]. Topic: Clinical - Prescription Issue >> Jun 05, 2024 11:12 AM Latasha Lawrence wrote: Reason for CRM: Patient is currently prescribed QUEtiapine  (SEROQUEL ) 100 MG tablet and states it is not helping her sleep at all. Patient is requesting to be placed back on traZODone  (DESYREL ) tablet 200 mg, as she previously took.  Patient can be reached at [phone]

## 2024-06-05 NOTE — Telephone Encounter (Signed)
 Left message for patient to return call OK to advise  need an appointment,  scheduled for tomorrow please have patient arrive at 2 pm.

## 2024-06-05 NOTE — Telephone Encounter (Signed)
 Patient will need to be seen for this.  She was recently seen in the ER for the same thing.  We need to do an ER follow-up.

## 2024-06-06 ENCOUNTER — Encounter: Payer: Self-pay | Admitting: Internal Medicine

## 2024-06-06 ENCOUNTER — Telehealth: Payer: Self-pay

## 2024-06-06 ENCOUNTER — Ambulatory Visit (INDEPENDENT_AMBULATORY_CARE_PROVIDER_SITE_OTHER): Admitting: Internal Medicine

## 2024-06-06 VITALS — BP 128/84 | Ht 66.0 in | Wt 189.0 lb

## 2024-06-06 DIAGNOSIS — F419 Anxiety disorder, unspecified: Secondary | ICD-10-CM

## 2024-06-06 DIAGNOSIS — F5105 Insomnia due to other mental disorder: Secondary | ICD-10-CM | POA: Diagnosis not present

## 2024-06-06 DIAGNOSIS — F32A Depression, unspecified: Secondary | ICD-10-CM

## 2024-06-06 DIAGNOSIS — F99 Mental disorder, not otherwise specified: Secondary | ICD-10-CM

## 2024-06-06 DIAGNOSIS — R634 Abnormal weight loss: Secondary | ICD-10-CM | POA: Diagnosis not present

## 2024-06-06 DIAGNOSIS — R140 Abdominal distension (gaseous): Secondary | ICD-10-CM

## 2024-06-06 DIAGNOSIS — R632 Polyphagia: Secondary | ICD-10-CM

## 2024-06-06 MED ORDER — DOXEPIN HCL 6 MG PO TABS: 6.0000 mg | ORAL_TABLET | Freq: Every evening | ORAL | 0 refills | Status: DC

## 2024-06-06 NOTE — Telephone Encounter (Signed)
 LM. Okay to advise below if patient calls back.

## 2024-06-06 NOTE — Telephone Encounter (Signed)
 6 mg is the max dose for insomnia.  She needs to check with other pharmacies.

## 2024-06-06 NOTE — Telephone Encounter (Signed)
 Copied from CRM [id number]. Topic: Clinical - Prescription Issue >> Jun 06, 2024  2:47 PM DeAngela L wrote: Reason for CRM: [place] with [facility] calling to ask if the provider would like to send a new electronics script for Doxepin  in the dosage of 10 mg cause that is the smallest dosage that they stock, or maybe the provider knows a pharm that has this script  Doxepin  HCl 6 MG TABS  [facility] [place], [place] - [address] [place] [address] Phone: [phone] Fax: [phone]

## 2024-06-06 NOTE — Progress Notes (Signed)
 Subjective:    Patient ID: Latasha Lawrence, female    DOB: 1970-10-14, 53 y.o.   MRN: [id number]  HPI  Patient presents to clinic today for ER follow-up for insomnia.  She presented to the ER 8/22 with complaint of insomnia.  At that time she had not slept in 6 days.  She reports she has taken ambien , trazodone , hydroxyzine  and seroquel  with no relief of symptoms.  She feels stressed because she is not sleeping however she does not feel like stress is contributing to her insomnia.  She is not laying there thinking.  She reports she is just unable to turn her brain off.  She reports she will lay there for 2 to 3 hours, unable to sleep and then get up.  She repeats this process multiple times a day.  She reports the only thing that has ever helped her sleep in the past was alprazolam .  She has never had a sleep study.  She has a history of anxiety and depression, currently managed on venlafaxine  and buspirone .  She is feeling anxious but feels like her depression is controlled.  She is not currently seeing a therapist as an appointment with psychiatry 9/2.  She also reports that she is hungry all the time and never seems to get full.  She has associated abdominal bloating but denies nausea, vomiting, reflux, abdominal pain, constipation or diarrhea.  She reports she eats up to 4 times a day and still loses weight.  She has lost 14 pounds in the last 3 months.  She does smoke.  She denies recreational drug use or alcohol.  She is not currently up-to-date with her cancer screenings.  Review of Systems   Past Medical History:  Diagnosis Date   Abnormal weight gain    Anxiety    Chronic back pain    Controlled substance agreement terminated 07/2014   failed UDS at [facility]   Depression    Fibromyalgia    GERD (gastroesophageal reflux disease)    Headache    Hirsutism    Hypertension    Hypokalemia    Neutrophilic leukocytosis    Periodontal disease    Stomach ulcer    Stroke  (HCC)    Vitamin D  deficiency disease     Current Outpatient Medications  Medication Sig Dispense Refill   aspirin  EC 81 MG tablet Take 1 tablet (81 mg total) by mouth daily. Swallow whole. (Patient not taking: Reported on 02/14/2024) 30 tablet 12   atorvastatin  (LIPITOR ) 80 MG tablet Take 1 tablet (80 mg total) by mouth daily at 6 PM. 90 tablet 1   busPIRone  (BUSPAR ) 10 MG tablet Take 1 tablet (10 mg total) by mouth 3 (three) times daily as needed. 90 tablet 0   carvedilol  (COREG ) 12.5 MG tablet Take 1 tablet (12.5 mg total) by mouth 2 (two) times daily with a meal. 180 tablet 0   fluticasone  (FLONASE ) 50 MCG/ACT nasal spray Place 1 spray into both nostrils in the morning and at bedtime. 16 g 5   hydrOXYzine  (VISTARIL ) 100 MG capsule Take 1 capsule (100 mg total) by mouth 3 (three) times daily as needed. 90 capsule 1   lidocaine  4 % Place 1 patch onto the skin daily. 20 patch 0   Olmesartan -amLODIPine -HCTZ 40-10-25 MG TABS Take 1 tablet by mouth daily at 12 noon. 90 tablet 1   pantoprazole  (PROTONIX ) 40 MG tablet Take 1 tablet (40 mg total) by mouth daily. 90 tablet 1  QUEtiapine  (SEROQUEL ) 100 MG tablet Take 1 tablet (100 mg total) by mouth at bedtime. 30 tablet 0   venlafaxine  XR (EFFEXOR -XR) 150 MG 24 hr capsule Take 1 capsule (150 mg total) by mouth daily with breakfast. Take along with 75 mg capsule for a total daily dose of 225 mg. 90 capsule 1   venlafaxine  XR (EFFEXOR -XR) 75 MG 24 hr capsule Take 1 capsule (75 mg total) by mouth daily. Take along with 150 mg capsule for a total daily dose of 225 mg. 90 capsule 0   zolpidem  (AMBIEN ) 10 MG tablet Take 1 tablet (10 mg total) by mouth at bedtime as needed for sleep. 20 tablet 0   No current facility-administered medications for this visit.    Allergies  Allergen Reactions   Nsaids Other (See Comments)    Causes ulcers to bleed   Morphine  And Codeine  Itching    Family History  Problem Relation Age of Onset   Arthritis Mother     Cancer Mother        breast   Heart failure Mother    Hyperlipidemia Father    Hypertension Father    Stroke Maternal Grandmother     Social History   Socioeconomic History   Marital status: Widowed    Spouse name: Not on file   Number of children: Not on file   Years of education: Not on file   Highest education level: Not on file  Occupational History   Not on file  Tobacco Use   Smoking status: Every Day    Current packs/day: 0.50    Average packs/day: 0.5 packs/day for 40.0 years (20.0 ttl pk-yrs)    Types: Cigarettes   Smokeless tobacco: Never  Vaping Use   Vaping status: Former  Substance and Sexual Activity   Alcohol use: No   Drug use: No   Sexual activity: Not Currently    Birth control/protection: None  Other Topics Concern   Not on file  Social History Narrative   Not on file   Social Drivers of Health   Financial Resource Strain: Not on file  Food Insecurity: Food Insecurity Present (08/06/2023)   Hunger Vital Sign    Worried About Running Out of Food in the Last Year: Sometimes true    Ran Out of Food in the Last Year: Sometimes true  Transportation Needs: No Transportation Needs (08/06/2023)   PRAPARE - Administrator, Civil Service (Medical): No    Lack of Transportation (Non-Medical): No  Physical Activity: Not on file  Stress: Not on file  Social Connections: Not on file  Intimate Partner Violence: Not At Risk (08/06/2023)   Humiliation, Afraid, Rape, and Kick questionnaire    Fear of Current or Ex-Partner: No    Emotionally Abused: No    Physically Abused: No    Sexually Abused: No     Constitutional: Denies fever, malaise, fatigue, headache or abrupt weight changes.  Respiratory: Denies difficulty breathing, shortness of breath, cough or sputum production.   Cardiovascular: Denies chest pain, chest tightness, palpitations or swelling in the hands or feet.  Abdomen: Patient reports being insatiable and bloating.  Denies  abdominal pain, constipation, diarrhea or blood in the stool. Musculoskeletal: Patient reports chronic joint and muscle pain.  Denies decrease in range of motion, difficulty with gait, or joint swelling.  Skin: Denies redness, rashes, lesions or ulcercations.  Neurological: Patient reports insomnia.  Denies dizziness, difficulty with memory, difficulty with speech or problems with balance and coordination.  Psych: Patient has a history of anxiety and depression.  Denies  SI/HI.      Objective:   Physical Exam BP 128/84   Ht 5' 6 (1.676 m)   Wt 189 lb (85.7 kg)   LMP 11/28/2021 (Approximate)   BMI 30.51 kg/m    Wt Readings from Last 3 Encounters:  02/14/24 203 lb 3.2 oz (92.2 kg)  01/30/24 185 lb (83.9 kg)  01/24/24 190 lb 2 oz (86.2 kg)    General: Appears her stated age, obese, in NAD. Cardiovascular: Normal rate.  Pulmonary/Chest: Normal effort. No respiratory distress. Musculoskeletal: No difficulty with gait.  Neurological: Alert and oriented.  Coordination normal.  Psychiatric: Mood and affect normal.  Mildly anxious appearing, borderline agitated.  Judgment and thought content normal.    BMET    Component Value Date/Time   NA 137 01/30/2024 1425   NA 142 12/05/2022 1138   NA 138 09/06/2014 0132   K 3.4 (L) 01/30/2024 1425   K 3.8 09/06/2014 0132   CL 101 01/30/2024 1425   CL 105 09/06/2014 0132   CO2 26 01/30/2024 1425   CO2 27 09/06/2014 0132   GLUCOSE 110 (H) 01/30/2024 1425   GLUCOSE 105 (H) 09/06/2014 0132   BUN 15 01/30/2024 1425   BUN 8 12/05/2022 1138   BUN 8 09/06/2014 0132   CREATININE 0.77 01/30/2024 1425   CREATININE 0.88 11/06/2023 1040   CALCIUM  10.4 (H) 01/30/2024 1425   CALCIUM  9.4 09/06/2014 0132   GFRNONAA >60 01/30/2024 1425   GFRNONAA >60 09/06/2014 0132   GFRNONAA >60 01/21/2014 0531   GFRAA 77 02/19/2020 1452   GFRAA >60 09/06/2014 0132   GFRAA >60 01/21/2014 0531    Lipid Panel     Component Value Date/Time   CHOL 146  11/06/2023 1040   CHOL 120 12/05/2022 1138   CHOL 268 (H) 01/19/2014 1427   TRIG 82 11/06/2023 1040   TRIG 340 (H) 01/19/2014 1427   HDL 46 (L) 11/06/2023 1040   HDL 52 12/05/2022 1138   HDL 33 (L) 01/19/2014 1427   CHOLHDL 3.2 11/06/2023 1040   VLDL UNABLE TO CALCULATE IF TRIGLYCERIDE OVER 400 mg/dL 92/87/7981 9281   VLDL 68 (H) 01/19/2014 1427   LDLCALC 83 11/06/2023 1040   LDLCALC 167 (H) 01/19/2014 1427    CBC    Component Value Date/Time   WBC 11.0 (H) 01/30/2024 1425   RBC 5.88 (H) 01/30/2024 1425   HGB 17.6 (H) 01/30/2024 1425   HGB 15.5 12/05/2022 1138   HCT 51.3 (H) 01/30/2024 1425   HCT 46.4 12/05/2022 1138   PLT 388 01/30/2024 1425   PLT 346 12/05/2022 1138   MCV 87.2 01/30/2024 1425   MCV 87 12/05/2022 1138   MCV 86 09/06/2014 0132   MCH 29.9 01/30/2024 1425   MCHC 34.3 01/30/2024 1425   RDW 13.0 01/30/2024 1425   RDW 13.1 12/05/2022 1138   RDW 15.1 (H) 09/06/2014 0132   LYMPHSABS 0.9 12/05/2022 1138   LYMPHSABS 2.1 09/06/2014 0132   MONOABS 0.3 11/04/2022 0259   MONOABS 0.9 09/06/2014 0132   EOSABS 0.1 12/05/2022 1138   EOSABS 0.1 09/06/2014 0132   BASOSABS 0.0 12/05/2022 1138   BASOSABS 0.0 09/06/2014 0132    Hgb A1C Lab Results  Component Value Date   HGBA1C 5.9 (H) 11/06/2023            Assessment & Plan:   ER follow-up for insomnia, refractory to multiple medications ER notes reviewed.  Chronic insomnia  unresponsive to ambien , seroquel , trazodone , and hydroxyzine . Psychiatry referral for further management. Trial of doxepin  initiated. - Prescribe doxepin  6 mg, 30 minutes before bed. - Advise against mixing sleep medications. - Follow up with psychiatry on September 2.  Unintentional weight loss with increased appetite and bloating Increased appetite with frequent eating but continued weight loss. Differential includes H. pylori infection or gastrointestinal issues. - She declines TSH, H. pylori breath test, cancer screenings or  referral to GI at this time - Will continue to monitor symptoms    Scheduled appointment for you annual exam Angeline Laura, NP

## 2024-06-06 NOTE — Patient Instructions (Signed)
 Insomnia Insomnia is a sleep disorder that makes it difficult to fall asleep or stay asleep. Insomnia can cause fatigue, low energy, difficulty concentrating, mood swings, and poor performance at work or school. There are three different ways to classify insomnia: Difficulty falling asleep. Difficulty staying asleep. Waking up too early in the morning. Any type of insomnia can be long-term (chronic) or short-term (acute). Both are common. Short-term insomnia usually lasts for 3 months or less. Chronic insomnia occurs at least three times a week for longer than 3 months. What are the causes? Insomnia may be caused by another condition, situation, or substance, such as: Having certain mental health conditions, such as anxiety and depression. Using caffeine, alcohol , tobacco, or drugs. Having gastrointestinal conditions, such as gastroesophageal reflux disease (GERD). Having certain medical conditions. These include: Asthma. Alzheimer's disease. Stroke. Chronic pain. An overactive thyroid  gland (hyperthyroidism). Other sleep disorders, such as restless legs syndrome and sleep apnea. Menopause. Sometimes, the cause of insomnia may not be known. What increases the risk? Risk factors for insomnia include: Gender. Females are affected more often than males. Age. Insomnia is more common as people get older. Stress and certain medical and mental health conditions. Lack of exercise. Having an irregular work schedule. This may include working night shifts and traveling between different time zones. What are the signs or symptoms? If you have insomnia, the main symptom is having trouble falling asleep or having trouble staying asleep. This may lead to other symptoms, such as: Feeling tired or having low energy. Feeling nervous about going to sleep. Not feeling rested in the morning. Having trouble concentrating. Feeling irritable, anxious, or depressed. How is this diagnosed? This condition  may be diagnosed based on: Your symptoms and medical history. Your health care provider may ask about: Your sleep habits. Any medical conditions you have. Your mental health. A physical exam. How is this treated? Treatment for insomnia depends on the cause. Treatment may focus on treating an underlying condition that is causing the insomnia. Treatment may also include: Medicines to help you sleep. Counseling or therapy. Lifestyle adjustments to help you sleep better. Follow these instructions at home: Eating and drinking  Limit or avoid alcohol , caffeinated beverages, and products that contain nicotine and tobacco, especially close to bedtime. These can disrupt your sleep. Do not eat a large meal or eat spicy foods right before bedtime. This can lead to digestive discomfort that can make it hard for you to sleep. Sleep habits  Keep a sleep diary to help you and your health care provider figure out what could be causing your insomnia. Write down: When you sleep. When you wake up during the night. How well you sleep and how rested you feel the next day. Any side effects of medicines you are taking. What you eat and drink. Make your bedroom a dark, comfortable place where it is easy to fall asleep. Put up shades or blackout curtains to block light from outside. Use a white noise machine to block noise. Keep the temperature cool. Limit screen use before bedtime. This includes: Not watching TV. Not using your smartphone, tablet, or computer. Stick to a routine that includes going to bed and waking up at the same times every day and night. This can help you fall asleep faster. Consider making a quiet activity, such as reading, part of your nighttime routine. Try to avoid taking naps during the day so that you sleep better at night. Get out of bed if you are still awake after  15 minutes of trying to sleep. Keep the lights down, but try reading or doing a quiet activity. When you feel  sleepy, go back to bed. General instructions Take over-the-counter and prescription medicines only as told by your health care provider. Exercise regularly as told by your health care provider. However, avoid exercising in the hours right before bedtime. Use relaxation techniques to manage stress. Ask your health care provider to suggest some techniques that may work well for you. These may include: Breathing exercises. Routines to release muscle tension. Visualizing peaceful scenes. Make sure that you drive carefully. Do not drive if you feel very sleepy. Keep all follow-up visits. This is important. Contact a health care provider if: You are tired throughout the day. You have trouble in your daily routine due to sleepiness. You continue to have sleep problems, or your sleep problems get worse. Get help right away if: You have thoughts about hurting yourself or someone else. Get help right away if you feel like you may hurt yourself or others, or have thoughts about taking your own life. Go to your nearest emergency room or: Call 911. Call the National Suicide Prevention Lifeline at 2232757840 or 988. This is open 24 hours a day. Text the Crisis Text Line at 657-529-4371. Summary Insomnia is a sleep disorder that makes it difficult to fall asleep or stay asleep. Insomnia can be long-term (chronic) or short-term (acute). Treatment for insomnia depends on the cause. Treatment may focus on treating an underlying condition that is causing the insomnia. Keep a sleep diary to help you and your health care provider figure out what could be causing your insomnia. This information is not intended to replace advice given to you by your health care provider. Make sure you discuss any questions you have with your health care provider. Document Revised: 09/05/2021 Document Reviewed: 09/05/2021 Elsevier Patient Education  2024 [employer].

## 2024-06-10 ENCOUNTER — Ambulatory Visit (INDEPENDENT_AMBULATORY_CARE_PROVIDER_SITE_OTHER): Admitting: Psychiatry

## 2024-06-10 ENCOUNTER — Encounter: Payer: Self-pay | Admitting: Psychiatry

## 2024-06-10 VITALS — BP 118/82 | HR 86 | Temp 97.8°F | Ht 66.0 in | Wt 192.8 lb

## 2024-06-10 DIAGNOSIS — F5105 Insomnia due to other mental disorder: Secondary | ICD-10-CM

## 2024-06-10 DIAGNOSIS — F410 Panic disorder [episodic paroxysmal anxiety] without agoraphobia: Secondary | ICD-10-CM

## 2024-06-10 DIAGNOSIS — F99 Mental disorder, not otherwise specified: Secondary | ICD-10-CM

## 2024-06-10 DIAGNOSIS — F323 Major depressive disorder, single episode, severe with psychotic features: Secondary | ICD-10-CM | POA: Diagnosis not present

## 2024-06-10 MED ORDER — AMITRIPTYLINE HCL 25 MG PO TABS: 25.0000 mg | ORAL_TABLET | Freq: Every day | ORAL | 0 refills | Status: AC

## 2024-06-10 NOTE — Progress Notes (Signed)
 Psychiatric Initial Adult Assessment   Patient Identification: Latasha Lawrence MRN:  [id number] Date of Evaluation:  06/10/2024 Referral Source: Antonette Corrigan NP Chief Complaint:   Chief Complaint  Patient presents with   Establish Care   Visit Diagnosis: No diagnosis found.  History of Present Illness: 53 year old female presenting [facility] for establishing care.  Patient reports that she is unable to get to sleep and states that she is having depressive symptoms.  Patient reports she has been on Effexor  225 mg once daily as well as buspirone  10 mg 3 times a day as needed for.  Patient reports that she has not been seeing her primary care doctor trying to ask for medications to help with her sleep.  Patient reports that she is unable to sleep despite trying trazodone , Seroquel , Ambien .  Patient reports that she has not picked up the doxepin  just yet and states that it was recommended by her PCP but she never picked it up.  Patient is asking for benzodiazepines and sedatives.  Patient has been advised that this provider will not be able to provide these prescriptions and will require higher level of care in which she verbalized agreement.  Patient has been explained that the best option right now is to consider amitriptyline  25 mg once daily to help with sleep.  Patient has been educated on sleep hygiene.  Patient has agreed that if she has suicidal thoughts with or without a plan she will call 911 or go to the emergency department.  Due to patient's comorbidities as well as complex issues with sleep and panic symptoms patient is requested to seek higher level of care.  Message has been sent to Dr. Eapen and Dr. Vickey.  Patient with no other questions or concerns.  Patient is in agreement for amitriptyline .  The patient is now to follow up and to be recommended to a psychiatrist on the next visit.  Associated Signs/Symptoms: Depression Symptoms:  anxiety, panic attacks, (Hypo) Manic Symptoms:   Distractibility, Elevated Mood, Flight of Ideas, Hallucinations, Impulsivity, Irritable Mood, Anxiety Symptoms:  Excessive Worry, Panic Symptoms, Psychotic Symptoms:  Hallucinations: Auditory Visual PTSD Symptoms: Negative  Past Psychiatric History:  Previous Psych Hospitalizations: - Patient reports no psychiatric hospitalization Outpatient treatment:  - Currently being managed by Corrigan Labrum Medications Current: - Amitriptyline  25mg  once daily - Effexor  225mg  once daily - Buspirone  10mg  TID as needed for anxiety.  Next Steps: - Recommended for high level of care Medication Trials: - Patient notes many failures but unable to state which when she failed Suicide & Violence: - Patient denies SI, HI, AVH Substance Use: - Cigarette smoking 1 pack a day Psychotherapy: - No recommend at this time Legal:  - No legal considerations Previous Psychotropic Medications: No   Substance Abuse History in the last 12 months:  No.  Consequences of Substance Abuse: Negative  Past Medical History:  Past Medical History:  Diagnosis Date   Abnormal weight gain    Anxiety    Chronic back pain    Controlled substance agreement terminated 07/2014   failed UDS [facility]   Depression    Fibromyalgia    GERD (gastroesophageal reflux disease)    Headache    Hirsutism    Hypertension    Hypokalemia    Neutrophilic leukocytosis    Periodontal disease    Stomach ulcer    Stroke (HCC)    Vitamin D  deficiency disease     Past Surgical History:  Procedure Laterality Date  Arm Surgery     CESAREAN SECTION     IR ANGIO INTRA EXTRACRAN SEL COM CAROTID INNOMINATE BILAT MOD SED  04/19/2017   IR ANGIO VERTEBRAL SEL VERTEBRAL BILAT MOD SED  04/19/2017   IR INTRAVSC STENT CERV CAROTID W/O EMB-PROT MOD SED INC ANGIO  04/25/2017   RADIOLOGY WITH ANESTHESIA Left 04/25/2017   Procedure: RADIOLOGY WITH ANESTHESIA LEFT CARDIAC  STENT;  Surgeon: Dolphus Carrion, MD;   Location: [facility] OR;  Service: Radiology;  Laterality: Left;    Family Psychiatric History: No additional  Family History:  Family History  Problem Relation Age of Onset   Arthritis Mother    Cancer Mother        breast   Heart failure Mother    Hyperlipidemia Father    Hypertension Father    Stroke Maternal Grandmother     Social History:   Social History   Socioeconomic History   Marital status: Widowed    Spouse name: Not on file   Number of children: 1   Years of education: Not on file   Highest education level: Associate degree: academic program  Occupational History   Not on file  Tobacco Use   Smoking status: Every Day    Current packs/day: 1.00    Average packs/day: 0.7 packs/day for 76.7 years (56.7 ttl pk-yrs)    Types: Cigarettes    Start date: 1989   Smokeless tobacco: Never  Vaping Use   Vaping status: Some Days   Substances: Nicotine , CBD, Flavoring  Substance and Sexual Activity   Alcohol use: No   Drug use: No   Sexual activity: Not Currently    Birth control/protection: None  Other Topics Concern   Not on file  Social History Narrative   Not on file   Social Drivers of Health   Financial Resource Strain: Not on file  Food Insecurity: Food Insecurity Present (08/06/2023)   Hunger Vital Sign    Worried About Running Out of Food in the Last Year: Sometimes true    Ran Out of Food in the Last Year: Sometimes true  Transportation Needs: No Transportation Needs (08/06/2023)   PRAPARE - Administrator, Civil Service (Medical): No    Lack of Transportation (Non-Medical): No  Physical Activity: Not on file  Stress: Not on file  Social Connections: Not on file    Additional Social History: No additional  Allergies:   Allergies  Allergen Reactions   Nsaids Other (See Comments)    Causes ulcers to bleed   Morphine  And Codeine  Itching    Metabolic Disorder Labs: Lab Results  Component Value Date   HGBA1C 5.9 (H) 11/06/2023   MPG  123 11/06/2023   MPG 111 04/19/2017   No results found for: PROLACTIN Lab Results  Component Value Date   CHOL 146 11/06/2023   TRIG 82 11/06/2023   HDL 46 (L) 11/06/2023   CHOLHDL 3.2 11/06/2023   VLDL UNABLE TO CALCULATE IF TRIGLYCERIDE OVER 400 mg/dL 92/87/7981   LDLCALC 83 11/06/2023   LDLCALC 48 12/05/2022   Lab Results  Component Value Date   TSH 1.600 12/05/2022    Therapeutic Level Labs: No results found for: LITHIUM No results found for: CBMZ No results found for: VALPROATE  Current Medications: Current Outpatient Medications  Medication Sig Dispense Refill   atorvastatin  (LIPITOR ) 80 MG tablet Take 1 tablet (80 mg total) by mouth daily at 6 PM. 90 tablet 1   busPIRone  (BUSPAR ) 10 MG tablet Take 1  tablet (10 mg total) by mouth 3 (three) times daily as needed. 90 tablet 0   carvedilol  (COREG ) 12.5 MG tablet Take 1 tablet (12.5 mg total) by mouth 2 (two) times daily with a meal. 180 tablet 0   Doxepin  HCl 6 MG TABS Take 1 tablet (6 mg total) by mouth at bedtime. 30 tablet 0   fluticasone  (FLONASE ) 50 MCG/ACT nasal spray Place 1 spray into both nostrils in the morning and at bedtime. 16 g 5   Olmesartan -amLODIPine -HCTZ 40-10-25 MG TABS Take 1 tablet by mouth daily at 12 noon. 90 tablet 1   venlafaxine  XR (EFFEXOR -XR) 150 MG 24 hr capsule Take 1 capsule (150 mg total) by mouth daily with breakfast. Take along with 75 mg capsule for a total daily dose of 225 mg. 90 capsule 1   venlafaxine  XR (EFFEXOR -XR) 75 MG 24 hr capsule Take 1 capsule (75 mg total) by mouth daily. Take along with 150 mg capsule for a total daily dose of 225 mg. 90 capsule 0   aspirin  EC 81 MG tablet Take 1 tablet (81 mg total) by mouth daily. Swallow whole. (Patient not taking: Reported on 06/06/2024) 30 tablet 12   No current facility-administered medications for this visit.    Musculoskeletal: Strength & Muscle Tone: within normal limits Gait & Station: normal Patient leans:  N/A  Psychiatric Specialty Exam: Review of Systems  Constitutional: Negative.   HENT: Negative.    Eyes: Negative.   Respiratory: Negative.    Cardiovascular: Negative.   Gastrointestinal: Negative.   Endocrine: Negative.   Genitourinary: Negative.   Musculoskeletal: Negative.   Skin: Negative.   Allergic/Immunologic: Negative.   Neurological: Negative.   Hematological: Negative.   Psychiatric/Behavioral:  Positive for agitation, dysphoric mood and hallucinations.     Blood pressure 118/82, pulse 86, temperature 97.8 F (36.6 C), temperature source Temporal, height 5' 6 (1.676 m), weight 192 lb 12.8 oz (87.5 kg), last menstrual period 11/28/2021, SpO2 100%.Body mass index is 31.12 kg/m.  General Appearance: Well Groomed  Eye Contact:  Good  Speech:  Clear and Coherent  Volume:  Normal  Mood:  Anxious and Depressed  Affect:  Inappropriate  Thought Process:  Irrelevant  Orientation:  Full (Time, Place, and Person)  Thought Content:  Illogical  Suicidal Thoughts:  No  Homicidal Thoughts:  No  Memory:  Immediate;   Good Recent;   Good Remote;   Good  Judgement:  Fair  Insight:  Fair  Psychomotor Activity:  Normal  Concentration:  Concentration: Fair and Attention Span: Fair  Recall:  Good  Fund of Knowledge:Good  Language: Good  Akathisia:  No  Handed:  Right  AIMS (if indicated):    Assets:  Desire for Improvement Financial Resources/Insurance Housing  ADL's:  Intact  Cognition: WNL  Sleep:  Poor   Screenings: GAD-7    Flowsheet Row Office Visit from 06/06/2024 in [facility] Office Visit from 01/24/2024 in [facility] Office Visit from 01/09/2023 in [facility] Office Visit from 12/05/2022 in [facility] Office Visit from 02/13/2022 in [facility]  Total GAD-7 Score 19 18 17 17 11    PHQ2-9    Flowsheet Row Office Visit from 06/06/2024  in [facility] Office Visit from 01/24/2024 in [facility] Office Visit from 01/09/2023 in [facility] Office Visit from 12/05/2022 in [facility] Office Visit from 02/13/2022 in [facility]  PHQ-2 Total Score 6 5 5 2 2   PHQ-9 Total Score 22 20 21 16 13    Flowsheet Row ED from 01/30/2024 in [facility] Emergency Department at [facility] ED from 01/21/2024 in [facility] Emergency Department at [facility] ED from 09/22/2023 in [facility] Emergency Department at [facility]  C-SSRS RISK CATEGORY No Risk No Risk No Risk    Assessment and Plan:  Assessment - Diagnosis: Current severe episode of major depressive disorder with psychotic features, unspecified whether recurrent (HCC) [F32.3] 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  Continue Effexor  225mg  once daily. Continue Buspirone  10mg  BID as needed for anxiety.  - Psychotherapy: Not recommended - Education: Patient has been on medications, purpose, side effects, adverse reactions, and dosage. - Follow-Up: Patient referred to a higher level of care not following up with this provider - Referrals: No referrals - Safety Planning:  The patient has been educated, if they should have suicidal thoughts with or without a plan to call 911, or go to the closest emergency department.  Pt verbalized understanding.  Pt denies firearms within the home.  Pt also agrees to call the clinic should they have worsening symptoms before the next appointment.     Patient/Guardian was advised Release of Information must be obtained prior to any record release in order to collaborate their care  with an outside provider. Patient/Guardian was advised if they have not already done so to contact the registration department to sign all necessary forms in order for us  to release information regarding their care.   Consent: Patient/Guardian gives verbal consent for treatment and assignment of benefits for services provided during this visit. Patient/Guardian expressed understanding and agreed to proceed.   Dorn Jama Der, NP 9/2/20253:02 PM

## 2024-07-04 ENCOUNTER — Emergency Department
Admission: EM | Admit: 2024-07-04 | Discharge: 2024-07-04 | Disposition: A | Discharge status: Home / Self Care | Attending: Emergency Medicine | Admitting: Emergency Medicine

## 2024-07-04 DIAGNOSIS — G4700 Insomnia, unspecified: Secondary | ICD-10-CM | POA: Insufficient documentation

## 2024-07-04 DIAGNOSIS — I10 Essential (primary) hypertension: Secondary | ICD-10-CM | POA: Diagnosis not present

## 2024-07-04 NOTE — ED Provider Notes (Signed)
 [facility] Provider Note    Event Date/Time   First MD Initiated Contact with Patient 07/04/24 [phone]     (approximate)   History   Hypertension   HPI  Latasha Lawrence is a 53 y.o. female who presents to the ED for evaluation of Hypertension   I reviewed outside ED visit from 8/22 where patient was evaluated for insomnia as well as PCP visit from 8/29.  Obese patient with history of HTN, stroke, fibromyalgia, insomnia and anxiety/depression.  Prescribed Ambien , Seroquel , trazodone , hydroxyzine .  PCP recently added doxepin  to her regimen which she has not tried, recent psychiatrist added amitriptyline  to her regimen.  Patient presents to the ED from home via EMS for evaluation of insomnia.  She is requesting medications to help with insomnia to use in addition to her above regimen.  I report hesitancy of adding any additional medications, particular prescriptions such as benzodiazepines that she is requesting, to an already fairly robust regimen.  She reports no suicidal thoughts.   I attempted to discuss additional methods that can be helpful for sleep such as daytime aerobic exercise and other sleep hygiene methods and she quickly becomes irate, jumping out of the bed demanded the nurse take out the IV and telling me that I am a waste of space.  She briskly walked out of the ED in no acute distress, refusing discharge papers, refusing registration information.    Physical Exam   Triage Vital Signs: ED Triage Vitals  Encounter Vitals Group     BP      Girls Systolic BP Percentile      Girls Diastolic BP Percentile      Boys Systolic BP Percentile      Boys Diastolic BP Percentile      Pulse      Resp      Temp      Temp src      SpO2      Weight      Height      Head Circumference      Peak Flow      Pain Score      Pain Loc      Pain Education      Exclude from Growth Chart     Most recent vital signs: Vitals:   07/04/24 0834 07/04/24 0835   BP:  (!) 184/92  Pulse:  75  Resp:  (!) 27  Temp:  98.6 F (37 C)  SpO2: 97% 100%    General: Awake, no distress.  CV:  Good peripheral perfusion.  Resp:  Normal effort.  Abd:  No distention.  MSK:  No deformity noted.  Neuro:  No focal deficits appreciated. Other:     ED Results / Procedures / Treatments   Labs (all labs ordered are listed, but only abnormal results are displayed) Labs Reviewed - No data to display  EKG Sinus rhythm with a rate of 73 bpm.  Normal axis and intervals without clear signs of acute ischemia.  RADIOLOGY   Official radiology report(s): No results found.  PROCEDURES and INTERVENTIONS:  Procedures  Medications - No data to display   IMPRESSION / MDM / ASSESSMENT AND PLAN / ED COURSE  I reviewed the triage vital signs and the nursing notes.  Differential diagnosis includes, but is not limited to, acute mania, malingering, stroke  {Patient presents with symptoms of an acute illness or injury that is potentially life-threatening.  Patient presents to the ED requesting  medications for insomnia which I do not feel would be appropriate and patient stormed out of the ED refusing additional workup, medications for headache or other interventions.  She has no evidence of neurologic deficits or decompensated psychiatric illness to necessitate emergent psychiatric evaluation or IVC.  Discussed ED return precautions as she was using profanity and telling me that I am a waste of space.      FINAL CLINICAL IMPRESSION(S) / ED DIAGNOSES   Final diagnoses:  Insomnia, unspecified type     Rx / DC Orders   ED Discharge Orders     None        Note:  This document was prepared using Dragon voice recognition software and may include unintentional dictation errors.   Claudene Rover, MD 07/04/24 [phone]

## 2024-07-04 NOTE — ED Triage Notes (Signed)
 Pt arrives via [facility] from home with c/o high BP, no sleep for 8 days, HA x 2 days, back and rib pain x 3 days. Per EMS pt is seeing colors at the corner of their eyes. Pt has no hx of HTN. Pt has a Hx of stroke 6 years ago. Pt is A&Ox4 during triage.

## 2024-07-04 NOTE — ED Notes (Signed)
 EDP at bedside speaking with pt and pt started getting agitated when EDP was discussing what pt called good sleep hygiene and pt started raising their voice about wanting medication for sleep because I have tried all that and just want you to give me something. Isn't there a medication you can give me? EDP tried to verbally de-escalate the pt and discuss other alternatives to care, pt became more agitated and jumped off the stretcher, ripped off all VS monitoring equipment and demanded that the IV be removed from their arm. This RN was able to remove IV from pt's arm and pt stormed out of the exam room and down the hallway as soon as the iv was out of their arm. Pt had a steady, even gait.

## 2024-07-10 ENCOUNTER — Telehealth: Payer: Self-pay

## 2024-07-10 ENCOUNTER — Telehealth: Payer: Self-pay | Admitting: Internal Medicine

## 2024-07-10 NOTE — Telephone Encounter (Signed)
 Not on current med list, unable to pend    Copied from CRM [id number]. Topic: Clinical - Medication Question >> Jul 10, 2024 12:29 PM Larissa S wrote: Reason for CRM: Patient is requesting a refill on Omerprazole and protonix . Medications are not listed on current med list.  Callback # [phone]  [facility] [place], [place] - [address] [place] [address] Phone: [phone] Fax: [phone] Hours: Not open 24 hours

## 2024-07-10 NOTE — Telephone Encounter (Signed)
 Copied from CRM [id number]. Topic: Clinical - Medication Question >> Jul 10, 2024  4:37 PM DeAngela L wrote: Patient is asking if the provider could send the anti nausea medication to the pharmacy till she gets to her appointment to help with the concerns she is having now   Pt call back num  [phone]

## 2024-07-10 NOTE — Telephone Encounter (Signed)
 Copied from CRM [id number]. Topic: Clinical - Medication Question >> Jul 10, 2024  4:37 PM Latasha Lawrence wrote: Patient is asking if the provider could send the anti nausea medication to the pharmacy till she gets to her appointment to help with the concerns she is having now   Pt call back num  [phone]

## 2024-07-10 NOTE — Telephone Encounter (Signed)
 Medications should not be taken together. She is past due for a physical. Have her schedule this and we can discuss these medications at that time.

## 2024-07-10 NOTE — Telephone Encounter (Signed)
 Tried calling patient, number can not be completed as dialed. If patient calls back please schedule her with Angeline

## 2024-07-11 ENCOUNTER — Encounter: Payer: Self-pay | Admitting: Internal Medicine

## 2024-07-11 ENCOUNTER — Ambulatory Visit (INDEPENDENT_AMBULATORY_CARE_PROVIDER_SITE_OTHER): Admitting: Internal Medicine

## 2024-07-11 VITALS — BP 140/82 | Ht 66.0 in | Wt 190.8 lb

## 2024-07-11 DIAGNOSIS — R1013 Epigastric pain: Secondary | ICD-10-CM

## 2024-07-11 DIAGNOSIS — F5105 Insomnia due to other mental disorder: Secondary | ICD-10-CM

## 2024-07-11 DIAGNOSIS — Z8711 Personal history of peptic ulcer disease: Secondary | ICD-10-CM | POA: Diagnosis not present

## 2024-07-11 DIAGNOSIS — R197 Diarrhea, unspecified: Secondary | ICD-10-CM

## 2024-07-11 DIAGNOSIS — F99 Mental disorder, not otherwise specified: Secondary | ICD-10-CM

## 2024-07-11 DIAGNOSIS — K920 Hematemesis: Secondary | ICD-10-CM

## 2024-07-11 MED ORDER — OMEPRAZOLE 40 MG PO CPDR: 40.0000 mg | DELAYED_RELEASE_CAPSULE | Freq: Every day | ORAL | 0 refills | Status: DC

## 2024-07-11 MED ORDER — SUCRALFATE 1 G PO TABS
1.0000 g | ORAL_TABLET | Freq: Three times a day (TID) | ORAL | 0 refills | Status: AC
Start: 2024-07-11 — End: ?

## 2024-07-11 NOTE — Progress Notes (Signed)
 Subjective:    Patient ID: Latasha Lawrence, female    DOB: 12/17/1970, 53 y.o.   MRN: [id number]  HPI  Discussed the use of AI scribe software for clinical note transcription with the patient, who gave verbal consent to proceed.  [name] is a 53 year old female with stomach ulcers who presents with nausea and vomiting blood.  Nausea and vomiting began two days ago, with four episodes of hematemesis occurring yesterday. The vomitus is described as both bright red and brown. She also experiences abdominal pain and a burning sensation, which she associates with her history of stomach ulcers. She has not been taking any medication for these symptoms due to unavailability and has difficulty eating, attempting to manage with hot tea.  She has a history of stomach ulcers and has previously undergone an upper endoscopy at [facility], although I can find no record of this in [name] She has not been using ibuprofen or similar medications recently. Although she is not on any current medication for her symptoms, she has taken medications in the past for her ulcers.  She reports diarrhea but no blood in her stools or changes in stool color. Diarrhea is not atypical for her.  Severe insomnia is a significant issue, which she believes exacerbates her current condition. She has been referred to psychiatry in the past and was prescribed amitriptyline , which was ineffective. Chronic insomnia unresponsive to ambien , doxepin , seroquel , trazodone , and hydroxyzine .   No urinary or vaginal issues. She does not consume alcohol, stating a dislike for it.      Review of Systems   Past Medical History:  Diagnosis Date   Abnormal weight gain    Anxiety    Chronic back pain    Controlled substance agreement terminated 07/2014   failed UDS at [facility]   Depression    Fibromyalgia    GERD (gastroesophageal reflux disease)    Headache    Hirsutism    Hypertension    Hypokalemia     Neutrophilic leukocytosis    Periodontal disease    Stomach ulcer    Stroke (HCC)    Vitamin D  deficiency disease     Current Outpatient Medications  Medication Sig Dispense Refill   amitriptyline  (ELAVIL ) 25 MG tablet Take 1 tablet (25 mg total) by mouth at bedtime. 30 tablet 0   aspirin  EC 81 MG tablet Take 1 tablet (81 mg total) by mouth daily. Swallow whole. (Patient not taking: Reported on 06/06/2024) 30 tablet 12   atorvastatin  (LIPITOR ) 80 MG tablet Take 1 tablet (80 mg total) by mouth daily at 6 PM. 90 tablet 1   busPIRone  (BUSPAR ) 10 MG tablet Take 1 tablet (10 mg total) by mouth 3 (three) times daily as needed. 90 tablet 0   carvedilol  (COREG ) 12.5 MG tablet Take 1 tablet (12.5 mg total) by mouth 2 (two) times daily with a meal. 180 tablet 0   fluticasone  (FLONASE ) 50 MCG/ACT nasal spray Place 1 spray into both nostrils in the morning and at bedtime. 16 g 5   Olmesartan -amLODIPine -HCTZ 40-10-25 MG TABS Take 1 tablet by mouth daily at 12 noon. 90 tablet 1   venlafaxine  XR (EFFEXOR -XR) 150 MG 24 hr capsule Take 1 capsule (150 mg total) by mouth daily with breakfast. Take along with 75 mg capsule for a total daily dose of 225 mg. 90 capsule 1   venlafaxine  XR (EFFEXOR -XR) 75 MG 24 hr capsule Take 1 capsule (75 mg total) by mouth  daily. Take along with 150 mg capsule for a total daily dose of 225 mg. 90 capsule 0   No current facility-administered medications for this visit.    Allergies  Allergen Reactions   Nsaids Other (See Comments)    Causes ulcers to bleed   Morphine  And Codeine  Itching    Family History  Problem Relation Age of Onset   Arthritis Mother    Cancer Mother        breast   Heart failure Mother    Hyperlipidemia Father    Hypertension Father    Stroke Maternal Grandmother     Social History   Socioeconomic History   Marital status: Widowed    Spouse name: Not on file   Number of children: 1   Years of education: Not on file   Highest education  level: Associate degree: academic program  Occupational History   Not on file  Tobacco Use   Smoking status: Every Day    Current packs/day: 1.00    Average packs/day: 0.7 packs/day for 76.8 years (56.8 ttl pk-yrs)    Types: Cigarettes    Start date: 1989   Smokeless tobacco: Never  Vaping Use   Vaping status: Some Days   Substances: Nicotine , CBD, Flavoring  Substance and Sexual Activity   Alcohol use: No   Drug use: No   Sexual activity: Not Currently    Birth control/protection: None  Other Topics Concern   Not on file  Social History Narrative   Not on file   Social Drivers of Health   Financial Resource Strain: Not on file  Food Insecurity: Food Insecurity Present (08/06/2023)   Hunger Vital Sign    Worried About Running Out of Food in the Last Year: Sometimes true    Ran Out of Food in the Last Year: Sometimes true  Transportation Needs: No Transportation Needs (08/06/2023)   PRAPARE - Administrator, Civil Service (Medical): No    Lack of Transportation (Non-Medical): No  Physical Activity: Not on file  Stress: Not on file  Social Connections: Not on file  Intimate Partner Violence: Not At Risk (08/06/2023)   Humiliation, Afraid, Rape, and Kick questionnaire    Fear of Current or Ex-Partner: No    Emotionally Abused: No    Physically Abused: No    Sexually Abused: No     Constitutional: Denies fever, malaise, fatigue, headache or abrupt weight changes.  Respiratory: Denies difficulty breathing, shortness of breath, cough or sputum production.   Cardiovascular: Denies chest pain, chest tightness, palpitations or swelling in the hands or feet.  GI: Patient reports being nausea, vomiting blood, abdominal pain and diarrhea.  Denies constipation, or blood in the stool. GU: Denies urinary urgency, frequency, burning sensation, dysuria, blood in the urine, vaginal itching, vaginal discharge or abnormal vaginal bleeding. Skin: Denies redness, rashes,  lesions or ulcercations.  Neurological: Patient reports insomnia.  Denies dizziness, difficulty with memory, difficulty with speech or problems with balance and coordination.  Psych: Patient has a history of anxiety and depression.  Denies  SI/HI.      Objective:   Physical Exam BP (!) 140/82   Ht 5' 6 (1.676 m)   Wt 190 lb 12.8 oz (86.5 kg)   LMP 11/28/2021 (Approximate)   BMI 30.80 kg/m     Wt Readings from Last 3 Encounters:  06/06/24 189 lb (85.7 kg)  02/14/24 203 lb 3.2 oz (92.2 kg)  01/30/24 185 lb (83.9 kg)    General:  Appears her stated age, obese, in NAD. Cardiovascular: Normal rate and rhythm.  No murmurs, rubs or gallops noted. Pulmonary/Chest: Normal effort and positive vesicular breath sounds. No respiratory distress.  No wheezes, rales or rhonchi noted. Abdomen: Soft and nontender.  Active bowel sounds.  No distention or masses noted. Musculoskeletal: No difficulty with gait.  Neurological: Alert and oriented.   Psychiatric: Mood and affect normal.  Mildly anxious appearing, borderline agitated.  Judgment and thought content normal.    BMET    Component Value Date/Time   NA 137 01/30/2024 1425   NA 142 12/05/2022 1138   NA 138 09/06/2014 0132   K 3.4 (L) 01/30/2024 1425   K 3.8 09/06/2014 0132   CL 101 01/30/2024 1425   CL 105 09/06/2014 0132   CO2 26 01/30/2024 1425   CO2 27 09/06/2014 0132   GLUCOSE 110 (H) 01/30/2024 1425   GLUCOSE 105 (H) 09/06/2014 0132   BUN 15 01/30/2024 1425   BUN 8 12/05/2022 1138   BUN 8 09/06/2014 0132   CREATININE 0.77 01/30/2024 1425   CREATININE 0.88 11/06/2023 1040   CALCIUM  10.4 (H) 01/30/2024 1425   CALCIUM  9.4 09/06/2014 0132   GFRNONAA >60 01/30/2024 1425   GFRNONAA >60 09/06/2014 0132   GFRNONAA >60 01/21/2014 0531   GFRAA 77 02/19/2020 1452   GFRAA >60 09/06/2014 0132   GFRAA >60 01/21/2014 0531    Lipid Panel     Component Value Date/Time   CHOL 146 11/06/2023 1040   CHOL 120 12/05/2022 1138   CHOL  268 (H) 01/19/2014 1427   TRIG 82 11/06/2023 1040   TRIG 340 (H) 01/19/2014 1427   HDL 46 (L) 11/06/2023 1040   HDL 52 12/05/2022 1138   HDL 33 (L) 01/19/2014 1427   CHOLHDL 3.2 11/06/2023 1040   VLDL UNABLE TO CALCULATE IF TRIGLYCERIDE OVER 400 mg/dL 92/87/7981 9281   VLDL 68 (H) 01/19/2014 1427   LDLCALC 83 11/06/2023 1040   LDLCALC 167 (H) 01/19/2014 1427    CBC    Component Value Date/Time   WBC 11.0 (H) 01/30/2024 1425   RBC 5.88 (H) 01/30/2024 1425   HGB 17.6 (H) 01/30/2024 1425   HGB 15.5 12/05/2022 1138   HCT 51.3 (H) 01/30/2024 1425   HCT 46.4 12/05/2022 1138   PLT 388 01/30/2024 1425   PLT 346 12/05/2022 1138   MCV 87.2 01/30/2024 1425   MCV 87 12/05/2022 1138   MCV 86 09/06/2014 0132   MCH 29.9 01/30/2024 1425   MCHC 34.3 01/30/2024 1425   RDW 13.0 01/30/2024 1425   RDW 13.1 12/05/2022 1138   RDW 15.1 (H) 09/06/2014 0132   LYMPHSABS 0.9 12/05/2022 1138   LYMPHSABS 2.1 09/06/2014 0132   MONOABS 0.3 11/04/2022 0259   MONOABS 0.9 09/06/2014 0132   EOSABS 0.1 12/05/2022 1138   EOSABS 0.1 09/06/2014 0132   BASOSABS 0.0 12/05/2022 1138   BASOSABS 0.0 09/06/2014 0132    Hgb A1C Lab Results  Component Value Date   HGBA1C 5.9 (H) 11/06/2023            Assessment & Plan:   Assessment and Plan    Abdominal pain, nausea with hematemesis, history of stomach ulcers: Acute gastrointestinal bleeding with nausea and vomiting, likely due to peptic ulcer disease exacerbation. No recent NSAID use. History of stomach ulcers and previous upper endoscopy. Prefers medical management before GI referral. - Prescribe omeprazole 40 mg once daily. - Prescribe Carafate1 gm fourtimes a day, option to take at bedtime  with food. - Advise adherence to bland diet (BRAT diet). - Discuss potential GI referral for upper endoscopy if symptoms persist however she would like to hold off at this time.   Insomnia Chronic insomnia with previous psychiatric evaluation. Amitriptyline   ineffective and has failed multiple other medications. Severe insomnia impacting daily function. Previous psychiatry referral. - Contact psychiatrist for further management options.        Scheduled appointment for you annual exam Angeline Laura, NP

## 2024-07-11 NOTE — Patient Instructions (Signed)
 Stomach Ulcer (Peptic Ulcer): Eating Plan A stomach ulcer is also called a peptic ulcer. A gastric ulcer is a sore in the lining of the stomach. A duodenal ulcer is a sore in the first part of the small intestine. When ulcers develop, they can cause a burning feeling in the stomach. They may also cause bloating, a feeling like you may throw up, throwing up, and poor appetite. If you have had a stomach ulcer, you should keep track of what foods and drinks cause symptoms. What are tips for following this plan?  Eat a healthy, well-balanced diet. This includes: Fresh fruits and vegetables. Eat a variety of colors of fruits and vegetables. Whole grains. Try to make sure at least half of the grains you eat each day are whole grains. Low-fat dairy. Lean meat, fish, poultry, eggs, beans, and nuts. Healthy fats, such as olive oil, grapeseed oil, or canola oil. Try to eat less than 8 teaspoons of fats and oils each day. Avoid foods that cause irritation or pain. These may be different for each person. Keep a food journal to identify foods that cause symptoms. Avoid processed foods that have added salt and sugar. Avoid drinking alcohol. Avoid drinks with caffeine , such as cola, black tea, energy drinks, and coffee. Recommended foods Fruits All fresh, frozen, or dried fruit. Fruit canned in juice. Vegetables All fresh or frozen vegetables. Low-salt (low-sodium) canned vegetables. Grains Whole grains. Meats and other protein foods Lean cuts of meat. Skinless poultry. Fresh or canned fish. Eggs. Tofu. Nuts and nut butter. Dried beans. Low-sodium canned beans. Dairy Low-fat or nonfat (skim) milk. Nonfat or low-fat yogurt. Nonfat or low-fat cheese. Beverages Water. Soy or nut milks. Caffeine -free soft drinks. Herbal tea. Fats and oils Olive oil. Canola oil. Grapeseed oil. Sunflower oil. Seasoning and other foods Low-fat salad dressing. Ketchup. Low-fat mayonnaise. All spices except pepper.  Low-sodium seasoning mixes. The items listed above may not be all the foods and drinks you can have. Talk with an expert in healthy eating called a dietitian to learn more. Foods to avoid Meats and other protein foods Fatty meats. Fried meats. Any meat that causes symptoms. Dairy Whole milk. Ice cream. Cream. Chocolate milk. Beverages Alcohol. Coffee. Cola and energy drinks. Black or green tea. Cocoa. Fats and oils Butter. Lard. Ghee. Seasoning and other foods Pepper. Hot sauce. Any seasonings or condiments that cause symptoms. The items listed above may not be all the foods and drinks you should avoid. Talk with a dietitian to learn more. This information is not intended to replace advice given to you by your health care provider. Make sure you discuss any questions you have with your health care provider. Document Revised: 12/10/2023 Document Reviewed: 12/10/2023 Elsevier Patient Education  2025 [employer].

## 2024-07-31 ENCOUNTER — Encounter: Admitting: Internal Medicine

## 2024-07-31 NOTE — Progress Notes (Deleted)
 Subjective:    Patient ID: Latasha Lawrence, female    DOB: 30-Aug-1971, 53 y.o.   MRN: [id number]  HPI  Patient presents to clinic today for her annual exam.  Flu: 10/2023 Tetanus: 12/2013 COVID: X 4 Pneumovax: 02/2021 Prevnar: Shingrix: Pap smear: 04/2020 Mammogram: >2 years Colon screening: Vision screening: Dentist:  Diet: Exercise:  Review of Systems   Past Medical History:  Diagnosis Date   Abnormal weight gain    Anxiety    Chronic back pain    Controlled substance agreement terminated 07/2014   failed UDS at [facility]   Depression    Fibromyalgia    GERD (gastroesophageal reflux disease)    Headache    Hirsutism    Hypertension    Hypokalemia    Neutrophilic leukocytosis    Periodontal disease    Stomach ulcer    Stroke (HCC)    Vitamin D  deficiency disease     Current Outpatient Medications  Medication Sig Dispense Refill   amitriptyline  (ELAVIL ) 25 MG tablet Take 1 tablet (25 mg total) by mouth at bedtime. (Patient not taking: Reported on 07/11/2024) 30 tablet 0   aspirin  EC 81 MG tablet Take 1 tablet (81 mg total) by mouth daily. Swallow whole. (Patient not taking: Reported on 07/11/2024) 30 tablet 12   atorvastatin  (LIPITOR ) 80 MG tablet Take 1 tablet (80 mg total) by mouth daily at 6 PM. 90 tablet 1   busPIRone  (BUSPAR ) 10 MG tablet Take 1 tablet (10 mg total) by mouth 3 (three) times daily as needed. 90 tablet 0   carvedilol  (COREG ) 12.5 MG tablet Take 1 tablet (12.5 mg total) by mouth 2 (two) times daily with a meal. 180 tablet 0   fluticasone  (FLONASE ) 50 MCG/ACT nasal spray Place 1 spray into both nostrils in the morning and at bedtime. 16 g 5   Olmesartan -amLODIPine -HCTZ 40-10-25 MG TABS Take 1 tablet by mouth daily at 12 noon. 90 tablet 1   omeprazole (PRILOSEC) 40 MG capsule Take 1 capsule (40 mg total) by mouth daily. 90 capsule 0   sucralfate (CARAFATE) 1 g tablet Take 1 tablet (1 g total) by mouth 4 (four) times daily -  with  meals and at bedtime. 270 tablet 0   venlafaxine  XR (EFFEXOR -XR) 150 MG 24 hr capsule Take 1 capsule (150 mg total) by mouth daily with breakfast. Take along with 75 mg capsule for a total daily dose of 225 mg. 90 capsule 1   venlafaxine  XR (EFFEXOR -XR) 75 MG 24 hr capsule Take 1 capsule (75 mg total) by mouth daily. Take along with 150 mg capsule for a total daily dose of 225 mg. 90 capsule 0   No current facility-administered medications for this visit.    Allergies  Allergen Reactions   Nsaids Other (See Comments)    Causes ulcers to bleed   Morphine  And Codeine  Itching    Family History  Problem Relation Age of Onset   Arthritis Mother    Cancer Mother        breast   Heart failure Mother    Hyperlipidemia Father    Hypertension Father    Stroke Maternal Grandmother     Social History   Socioeconomic History   Marital status: Widowed    Spouse name: Not on file   Number of children: 1   Years of education: Not on file   Highest education level: Associate degree: academic program  Occupational History   Not on file  Tobacco  Use   Smoking status: Every Day    Current packs/day: 1.00    Average packs/day: 0.7 packs/day for 76.8 years (56.8 ttl pk-yrs)    Types: Cigarettes    Start date: 1989   Smokeless tobacco: Never  Vaping Use   Vaping status: Some Days   Substances: Nicotine , CBD, Flavoring  Substance and Sexual Activity   Alcohol use: No   Drug use: No   Sexual activity: Not Currently    Birth control/protection: None  Other Topics Concern   Not on file  Social History Narrative   Not on file   Social Drivers of Health   Financial Resource Strain: Not on file  Food Insecurity: Food Insecurity Present (08/06/2023)   Hunger Vital Sign    Worried About Running Out of Food in the Last Year: Sometimes true    Ran Out of Food in the Last Year: Sometimes true  Transportation Needs: No Transportation Needs (08/06/2023)   PRAPARE - Doctor, general practice (Medical): No    Lack of Transportation (Non-Medical): No  Physical Activity: Not on file  Stress: Not on file  Social Connections: Not on file  Intimate Partner Violence: Not At Risk (08/06/2023)   Humiliation, Afraid, Rape, and Kick questionnaire    Fear of Current or Ex-Partner: No    Emotionally Abused: No    Physically Abused: No    Sexually Abused: No     Constitutional: Denies fever, malaise, fatigue, headache or abrupt weight changes.  HEENT: Denies eye pain, eye redness, ear pain, ringing in the ears, wax buildup, runny nose, nasal congestion, bloody nose, or sore throat. Respiratory: Denies difficulty breathing, shortness of breath, cough or sputum production.   Cardiovascular: Denies chest pain, chest tightness, palpitations or swelling in the hands or feet.  Gastrointestinal: Pt reports intermittent reflux. Denies abdominal pain, bloating, constipation, diarrhea or blood in the stool.  GU: Denies urgency, frequency, pain with urination, burning sensation, blood in urine, odor or discharge. Musculoskeletal: Patient reports chronic joint and muscle pain.  Denies decrease in range of motion, difficulty with gait, or joint swelling.  Skin: Denies redness, rashes, lesions or ulcercations.  Neurological: Patient reports insomnia.  Denies dizziness, difficulty with memory, difficulty with speech or problems with balance and coordination.  Psych: Patient has a history of anxiety and depression.  Denies  SI/HI.  No other specific complaints in a complete review of systems (except as listed in HPI above).      Objective:   Physical Exam  LMP 11/28/2021 (Approximate)   Wt Readings from Last 3 Encounters:  07/11/24 190 lb 12.8 oz (86.5 kg)  06/06/24 189 lb (85.7 kg)  02/14/24 203 lb 3.2 oz (92.2 kg)    General: Appears older than her stated age, obese, in NAD. Skin: Warm, dry and intact.  HEENT: Head: normal shape and size; Eyes: sclera white, no icterus,  conjunctiva pink, PERRLA and EOMs intact;  Cardiovascular: Normal rate and rhythm. S1,S2 noted.  No murmur, rubs or gallops noted. No JVD or BLE edema. No carotid bruits noted. Pulmonary/Chest: Normal effort and positive vesicular breath sounds. No respiratory distress. No wheezes, rales or ronchi noted.  Abdomen: Soft and nontender. Normal bowel sounds.  Musculoskeletal: No difficulty with gait.  Neurological: Alert and oriented. Coordination normal.  Psychiatric: Mood and affect normal. Mildly anxious appearing. Judgment and thought content normal.    BMET    Component Value Date/Time   NA 137 01/30/2024 1425   NA 142  12/05/2022 1138   NA 138 09/06/2014 0132   K 3.4 (L) 01/30/2024 1425   K 3.8 09/06/2014 0132   CL 101 01/30/2024 1425   CL 105 09/06/2014 0132   CO2 26 01/30/2024 1425   CO2 27 09/06/2014 0132   GLUCOSE 110 (H) 01/30/2024 1425   GLUCOSE 105 (H) 09/06/2014 0132   BUN 15 01/30/2024 1425   BUN 8 12/05/2022 1138   BUN 8 09/06/2014 0132   CREATININE 0.77 01/30/2024 1425   CREATININE 0.88 11/06/2023 1040   CALCIUM  10.4 (H) 01/30/2024 1425   CALCIUM  9.4 09/06/2014 0132   GFRNONAA >60 01/30/2024 1425   GFRNONAA >60 09/06/2014 0132   GFRNONAA >60 01/21/2014 0531   GFRAA 77 02/19/2020 1452   GFRAA >60 09/06/2014 0132   GFRAA >60 01/21/2014 0531    Lipid Panel     Component Value Date/Time   CHOL 146 11/06/2023 1040   CHOL 120 12/05/2022 1138   CHOL 268 (H) 01/19/2014 1427   TRIG 82 11/06/2023 1040   TRIG 340 (H) 01/19/2014 1427   HDL 46 (L) 11/06/2023 1040   HDL 52 12/05/2022 1138   HDL 33 (L) 01/19/2014 1427   CHOLHDL 3.2 11/06/2023 1040   VLDL UNABLE TO CALCULATE IF TRIGLYCERIDE OVER 400 mg/dL 92/87/7981 9281   VLDL 68 (H) 01/19/2014 1427   LDLCALC 83 11/06/2023 1040   LDLCALC 167 (H) 01/19/2014 1427    CBC    Component Value Date/Time   WBC 11.0 (H) 01/30/2024 1425   RBC 5.88 (H) 01/30/2024 1425   HGB 17.6 (H) 01/30/2024 1425   HGB 15.5  12/05/2022 1138   HCT 51.3 (H) 01/30/2024 1425   HCT 46.4 12/05/2022 1138   PLT 388 01/30/2024 1425   PLT 346 12/05/2022 1138   MCV 87.2 01/30/2024 1425   MCV 87 12/05/2022 1138   MCV 86 09/06/2014 0132   MCH 29.9 01/30/2024 1425   MCHC 34.3 01/30/2024 1425   RDW 13.0 01/30/2024 1425   RDW 13.1 12/05/2022 1138   RDW 15.1 (H) 09/06/2014 0132   LYMPHSABS 0.9 12/05/2022 1138   LYMPHSABS 2.1 09/06/2014 0132   MONOABS 0.3 11/04/2022 0259   MONOABS 0.9 09/06/2014 0132   EOSABS 0.1 12/05/2022 1138   EOSABS 0.1 09/06/2014 0132   BASOSABS 0.0 12/05/2022 1138   BASOSABS 0.0 09/06/2014 0132    Hgb A1C Lab Results  Component Value Date   HGBA1C 5.9 (H) 11/06/2023            Assessment & Plan:  Preventative health maintenance:  Flu Tetanus Encouraged her to get her COVID booster Pneumovax UTD Prevnar 20 Discussed Shingrix vaccine, she will check coverage with her insurance company and schedule visit if she would like to have this done Pap smear UTD  Mammogram Referral to GI for screening colonoscopy Encouraged her to consume a balanced diet and exercise regimen Advised her to see an eye doctor and dentist annually We will check CBC, c-Met, lipid, A1c today     RTC in 6 months for follow-up of chronic conditions Angeline Laura, NP

## 2024-08-12 ENCOUNTER — Other Ambulatory Visit: Payer: Self-pay | Admitting: Internal Medicine

## 2024-08-14 NOTE — Telephone Encounter (Signed)
 Requested Prescriptions  Pending Prescriptions Disp Refills   venlafaxine  XR (EFFEXOR -XR) 150 MG 24 hr capsule [Pharmacy Med Name: VENLAFAXINE  HCL ER 150 MG CAP] 90 capsule 0    Sig: Take 1 capsule (150 mg total) by mouth daily with breakfast. Take along with 75 mg capsule for a total daily dose of 225 mg.     Psychiatry: Antidepressants - SNRI - desvenlafaxine & venlafaxine  Failed - 08/14/2024 10:55 AM      Failed - Last BP in normal range    BP Readings from Last 1 Encounters:  07/11/24 (!) 140/82         Failed - Lipid Panel in normal range within the last 12 months    Cholesterol, Total  Date Value Ref Range Status  12/05/2022 120 100 - 199 mg/dL Final   Cholesterol  Date Value Ref Range Status  11/06/2023 146 <200 mg/dL Final  95/86/7984 731 (H) 0 - 200 mg/dL Final   Ldl Cholesterol, Calc  Date Value Ref Range Status  01/19/2014 167 (H) 0 - 100 mg/dL Final   LDL Cholesterol (Calc)  Date Value Ref Range Status  11/06/2023 83 mg/dL (calc) Final    Comment:    Reference range: <100 . Desirable range <100 mg/dL for primary prevention;   <70 mg/dL for patients with CHD or diabetic patients  with > or = 2 CHD risk factors. [name] LDL-C is now calculated using the Martin-Hopkins  calculation, which is a validated novel method providing  better accuracy than the Friedewald equation in the  estimation of LDL-C.  Gladis APPLETHWAITE et al. SANDREA. 7986;689(80): 2061-2068  (http://education.[facility].com/faq/FAQ164)    HDL Cholesterol  Date Value Ref Range Status  01/19/2014 33 (L) 40 - 60 mg/dL Final   HDL  Date Value Ref Range Status  11/06/2023 46 (L) > OR = 50 mg/dL Final  97/72/7975 52 >60 mg/dL Final   Triglycerides  Date Value Ref Range Status  11/06/2023 82 <150 mg/dL Final  95/86/7984 659 (H) 0 - 200 mg/dL Final         Passed - Cr in normal range and within 360 days    Creat  Date Value Ref Range Status  11/06/2023 0.88 0.50 - 1.03 mg/dL Final   Creatinine,  Ser  Date Value Ref Range Status  01/30/2024 0.77 0.44 - 1.00 mg/dL Final         Passed - Completed PHQ-2 or PHQ-9 in the last 360 days      Passed - Valid encounter within last 6 months    Recent Outpatient Visits           1 month ago Hematemesis with nausea   [facility] [place], Angeline ORN, NP   2 months ago Insomnia due to other mental disorder   [place] [facility] [place], [place] W, NP   6 months ago Insomnia due to other mental disorder   [place] [facility] [place], [place] W, NP   6 months ago Insomnia due to other mental disorder   [facility], [place]a PARAS, [place]

## 2024-08-18 ENCOUNTER — Other Ambulatory Visit: Payer: Self-pay

## 2024-08-18 MED ORDER — OLMESARTAN-AMLODIPINE-HCTZ 40-10-25 MG PO TABS: 1.0000 | ORAL_TABLET | Freq: Every day | ORAL | 1 refills | Status: AC

## 2024-09-19 ENCOUNTER — Other Ambulatory Visit: Payer: Self-pay

## 2024-09-19 ENCOUNTER — Other Ambulatory Visit: Payer: Self-pay | Admitting: Internal Medicine

## 2024-09-19 MED ORDER — OMEPRAZOLE 40 MG PO CPDR: 40.0000 mg | DELAYED_RELEASE_CAPSULE | Freq: Every day | ORAL | 0 refills | Status: AC

## 2024-09-22 NOTE — Telephone Encounter (Signed)
 Duplicate request.  Requested Prescriptions  Pending Prescriptions Disp Refills   omeprazole  (PRILOSEC) 40 MG capsule [Pharmacy Med Name: OMEPRAZOLE  DR 40 MG CAPSULE] 90 capsule 0    Sig: Take 1 capsule (40 mg total) by mouth daily.     Gastroenterology: Proton Pump Inhibitors Passed - 09/22/2024  4:29 PM      Passed - Valid encounter within last 12 months    Recent Outpatient Visits           2 months ago Hematemesis with nausea   [place] [facility] [place], [place] W, NP   3 months ago Insomnia due to other mental disorder   [place] [facility] [place], [place] W, NP   7 months ago Insomnia due to other mental disorder   [place] [facility] [place], [place] W, NP   8 months ago Insomnia due to other mental disorder   [place] [facility], [place] PARAS, DO               traZODone  (DESYREL ) 100 MG tablet [Pharmacy Med Name: TRAZODONE  100 MG TABLET] 180 tablet 0    Sig: Take 2 tablets (200 mg total) by mouth at bedtime.     Psychiatry: Antidepressants - Serotonin Modulator Passed - 09/22/2024  4:29 PM      Passed - Completed PHQ-2 or PHQ-9 in the last 360 days      Passed - Valid encounter within last 6 months    Recent Outpatient Visits           2 months ago Hematemesis with nausea   [place] [facility] [place], [place] W, NP   3 months ago Insomnia due to other mental disorder   [place] [facility] [place], [place] W, NP   7 months ago Insomnia due to other mental disorder    [facility] [place], [place] W, NP   8 months ago Insomnia due to other mental disorder   [facility] [place]a PARAS, [place]              '

## 2024-11-11 ENCOUNTER — Ambulatory Visit: Admitting: Internal Medicine

## 2024-11-11 NOTE — Progress Notes (Unsigned)
 "  Subjective:    Patient ID: Latasha Lawrence, female    DOB: [birth year], 54 y.o.   MRN: [id number]  HPI    Review of Systems   Past Medical History:  Diagnosis Date   Abnormal weight gain    Anxiety    Chronic back pain    Controlled substance agreement terminated 07/2014   failed UDS at [facility]   Depression    Fibromyalgia    GERD (gastroesophageal reflux disease)    Headache    Hirsutism    Hypertension    Hypokalemia    Neutrophilic leukocytosis    Periodontal disease    Stomach ulcer    Stroke (HCC)    Vitamin D  deficiency disease     Current Outpatient Medications  Medication Sig Dispense Refill   amitriptyline  (ELAVIL ) 25 MG tablet Take 1 tablet (25 mg total) by mouth at bedtime. (Patient not taking: Reported on 07/11/2024) 30 tablet 0   aspirin  EC 81 MG tablet Take 1 tablet (81 mg total) by mouth daily. Swallow whole. (Patient not taking: Reported on 07/11/2024) 30 tablet 12   atorvastatin  (LIPITOR ) 80 MG tablet Take 1 tablet (80 mg total) by mouth daily at 6 PM. 90 tablet 1   busPIRone  (BUSPAR ) 10 MG tablet Take 1 tablet (10 mg total) by mouth 3 (three) times daily as needed. 90 tablet 0   carvedilol  (COREG ) 12.5 MG tablet Take 1 tablet (12.5 mg total) by mouth 2 (two) times daily with a meal. 180 tablet 0   fluticasone  (FLONASE ) 50 MCG/ACT nasal spray Place 1 spray into both nostrils in the morning and at bedtime. 16 g 5   Olmesartan -amLODIPine -HCTZ 40-10-25 MG TABS Take 1 tablet by mouth daily at 12 noon. 90 tablet 1   omeprazole  (PRILOSEC) 40 MG capsule Take 1 capsule (40 mg total) by mouth daily. 90 capsule 0   sucralfate  (CARAFATE ) 1 g tablet Take 1 tablet (1 g total) by mouth 4 (four) times daily -  with meals and at bedtime. 270 tablet 0   venlafaxine  XR (EFFEXOR -XR) 150 MG 24 hr capsule Take 1 capsule (150 mg total) by mouth daily with breakfast. Take along with 75 mg capsule for a total daily dose of 225 mg. 90 capsule 0   venlafaxine  XR  (EFFEXOR -XR) 75 MG 24 hr capsule Take 1 capsule (75 mg total) by mouth daily. Take along with 150 mg capsule for a total daily dose of 225 mg. 90 capsule 0   No current facility-administered medications for this visit.    Allergies  Allergen Reactions   Nsaids Other (See Comments)    Causes ulcers to bleed   Morphine  And Codeine  Itching    Family History  Problem Relation Age of Onset   Arthritis Mother    Cancer Mother        breast   Heart failure Mother    Hyperlipidemia Father    Hypertension Father    Stroke Maternal Grandmother     Social History   Socioeconomic History   Marital status: Widowed    Spouse name: Not on file   Number of children: 1   Years of education: Not on file   Highest education level: Associate degree: academic program  Occupational History   Not on file  Tobacco Use   Smoking status: Every Day    Current packs/day: 1.00    Average packs/day: 0.7 packs/day for 77.1 years (57.1 ttl pk-yrs)    Types: Cigarettes  Start date: 1989   Smokeless tobacco: Never  Vaping Use   Vaping status: Some Days   Substances: Nicotine , CBD, Flavoring  Substance and Sexual Activity   Alcohol use: No   Drug use: No   Sexual activity: Not Currently    Birth control/protection: None  Other Topics Concern   Not on file  Social History Narrative   Not on file   Social Drivers of Health   Tobacco Use: High Risk (07/11/2024)   Patient History    Smoking Tobacco Use: Every Day    Smokeless Tobacco Use: Never    Passive Exposure: Not on file  Financial Resource Strain: Not on file  Food Insecurity: Food Insecurity Present (08/06/2023)   Hunger Vital Sign    Worried About Running Out of Food in the Last Year: Sometimes true    Ran Out of Food in the Last Year: Sometimes true  Transportation Needs: No Transportation Needs (08/06/2023)   PRAPARE - Administrator, Civil Service (Medical): No    Lack of Transportation (Non-Medical): No   Physical Activity: Not on file  Stress: Not on file  Social Connections: Not on file  Intimate Partner Violence: Not At Risk (08/06/2023)   Humiliation, Afraid, Rape, and Kick questionnaire    Fear of Current or Ex-Partner: No    Emotionally Abused: No    Physically Abused: No    Sexually Abused: No  Depression (PHQ2-9): Low Risk (07/11/2024)   Depression (PHQ2-9)    PHQ-2 Score: 0  Recent Concern: Depression (PHQ2-9) - High Risk (06/06/2024)   Depression (PHQ2-9)    PHQ-2 Score: 22  Alcohol Screen: Not on file  Housing: Low Risk (08/06/2023)   Housing    Last Housing Risk Score: 0  Utilities: Not At Risk (08/06/2023)   AHC Utilities    Threatened with loss of utilities: No  Health Literacy: Not on file     Constitutional: Patient reports headache.  Denies fever, malaise, fatigue, or abrupt weight changes.  HEENT: Pt reports swollen glands. Denies eye pain, eye redness, ear pain, ringing in the ears, wax buildup, runny nose, nasal congestion, bloody nose, or sore throat. Respiratory: Pt reports cough. Denies difficulty breathing, shortness of breath, or sputum production.   Cardiovascular: Denies chest pain, chest tightness, palpitations or swelling in the hands or feet.  Gastrointestinal: Pt reports intermittent reflux. Denies abdominal pain, bloating, constipation, diarrhea or blood in the stool.  GU: Denies urgency, frequency, pain with urination, burning sensation, blood in urine, odor or discharge. Musculoskeletal: Patient reports chronic joint and muscle pain.  Denies decrease in range of motion, difficulty with gait, or joint swelling.  Skin: Denies redness, rashes, lesions or ulcercations.  Neurological: Patient reports insomnia.  Denies dizziness, difficulty with memory, difficulty with speech or problems with balance and coordination.  Psych: Patient has a history of anxiety and depression.  Denies  SI/HI.  No other specific complaints in a complete review of systems  (except as listed in HPI above).      Objective:   Physical Exam  LMP 11/28/2021   Wt Readings from Last 3 Encounters:  07/11/24 190 lb 12.8 oz (86.5 kg)  06/06/24 189 lb (85.7 kg)  02/14/24 203 lb 3.2 oz (92.2 kg)    General: Appears older than her stated age, obese, in NAD. Skin: Warm, dry and intact.  HEENT: Head: normal shape and size; Eyes: sclera white, no icterus, conjunctiva pink, PERRLA and EOMs intact;  Cardiovascular: Normal rate and rhythm. S1,S2  noted.  No murmur, rubs or gallops noted. No JVD or BLE edema. No carotid bruits noted. Pulmonary/Chest: Normal effort and positive vesicular breath sounds. No respiratory distress. No wheezes, rales or ronchi noted.  Abdomen: Soft and nontender. Normal bowel sounds.  Musculoskeletal: No difficulty with gait.  Neurological: Alert and oriented. Coordination normal.  Psychiatric: Mood and affect normal. Mildly anxious appearing. Judgment and thought content normal.    BMET    Component Value Date/Time   NA 137 01/30/2024 1425   NA 142 12/05/2022 1138   NA 138 09/06/2014 0132   K 3.4 (L) 01/30/2024 1425   K 3.8 09/06/2014 0132   CL 101 01/30/2024 1425   CL 105 09/06/2014 0132   CO2 26 01/30/2024 1425   CO2 27 09/06/2014 0132   GLUCOSE 110 (H) 01/30/2024 1425   GLUCOSE 105 (H) 09/06/2014 0132   BUN 15 01/30/2024 1425   BUN 8 12/05/2022 1138   BUN 8 09/06/2014 0132   CREATININE 0.77 01/30/2024 1425   CREATININE 0.88 11/06/2023 1040   CALCIUM  10.4 (H) 01/30/2024 1425   CALCIUM  9.4 09/06/2014 0132   GFRNONAA >60 01/30/2024 1425   GFRNONAA >60 09/06/2014 0132   GFRNONAA >60 01/21/2014 0531   GFRAA 77 02/19/2020 1452   GFRAA >60 09/06/2014 0132   GFRAA >60 01/21/2014 0531    Lipid Panel     Component Value Date/Time   CHOL 146 11/06/2023 1040   CHOL 120 12/05/2022 1138   CHOL 268 (H) 01/19/2014 1427   TRIG 82 11/06/2023 1040   TRIG 340 (H) 01/19/2014 1427   HDL 46 (L) 11/06/2023 1040   HDL 52 12/05/2022 1138    HDL 33 (L) 01/19/2014 1427   CHOLHDL 3.2 11/06/2023 1040   VLDL UNABLE TO CALCULATE IF TRIGLYCERIDE OVER 400 mg/dL 92/87/7981 9281   VLDL 68 (H) 01/19/2014 1427   LDLCALC 83 11/06/2023 1040   LDLCALC 167 (H) 01/19/2014 1427    CBC    Component Value Date/Time   WBC 11.0 (H) 01/30/2024 1425   RBC 5.88 (H) 01/30/2024 1425   HGB 17.6 (H) 01/30/2024 1425   HGB 15.5 12/05/2022 1138   HCT 51.3 (H) 01/30/2024 1425   HCT 46.4 12/05/2022 1138   PLT 388 01/30/2024 1425   PLT 346 12/05/2022 1138   MCV 87.2 01/30/2024 1425   MCV 87 12/05/2022 1138   MCV 86 09/06/2014 0132   MCH 29.9 01/30/2024 1425   MCHC 34.3 01/30/2024 1425   RDW 13.0 01/30/2024 1425   RDW 13.1 12/05/2022 1138   RDW 15.1 (H) 09/06/2014 0132   LYMPHSABS 0.9 12/05/2022 1138   LYMPHSABS 2.1 09/06/2014 0132   MONOABS 0.3 11/04/2022 0259   MONOABS 0.9 09/06/2014 0132   EOSABS 0.1 12/05/2022 1138   EOSABS 0.1 09/06/2014 0132   BASOSABS 0.0 12/05/2022 1138   BASOSABS 0.0 09/06/2014 0132    Hgb A1C Lab Results  Component Value Date   HGBA1C 5.9 (H) 11/06/2023            Assessment & Plan:   Schedule an appointment for your annual exam Angeline Laura, NP "

## 2024-11-13 ENCOUNTER — Ambulatory Visit: Admitting: Internal Medicine

## 2024-11-13 NOTE — Progress Notes (Unsigned)
 "  Subjective:    Patient ID: Latasha Lawrence, female    DOB: [birth year], 54 y.o.   MRN: [id number]  HPI    Review of Systems   Past Medical History:  Diagnosis Date   Abnormal weight gain    Anxiety    Chronic back pain    Controlled substance agreement terminated 07/2014   failed UDS at [facility]   Depression    Fibromyalgia    GERD (gastroesophageal reflux disease)    Headache    Hirsutism    Hypertension    Hypokalemia    Neutrophilic leukocytosis    Periodontal disease    Stomach ulcer    Stroke (HCC)    Vitamin D  deficiency disease     Current Outpatient Medications  Medication Sig Dispense Refill   amitriptyline  (ELAVIL ) 25 MG tablet Take 1 tablet (25 mg total) by mouth at bedtime. (Patient not taking: Reported on 07/11/2024) 30 tablet 0   aspirin  EC 81 MG tablet Take 1 tablet (81 mg total) by mouth daily. Swallow whole. (Patient not taking: Reported on 07/11/2024) 30 tablet 12   atorvastatin  (LIPITOR ) 80 MG tablet Take 1 tablet (80 mg total) by mouth daily at 6 PM. 90 tablet 1   busPIRone  (BUSPAR ) 10 MG tablet Take 1 tablet (10 mg total) by mouth 3 (three) times daily as needed. 90 tablet 0   carvedilol  (COREG ) 12.5 MG tablet Take 1 tablet (12.5 mg total) by mouth 2 (two) times daily with a meal. 180 tablet 0   fluticasone  (FLONASE ) 50 MCG/ACT nasal spray Place 1 spray into both nostrils in the morning and at bedtime. 16 g 5   Olmesartan -amLODIPine -HCTZ 40-10-25 MG TABS Take 1 tablet by mouth daily at 12 noon. 90 tablet 1   omeprazole  (PRILOSEC) 40 MG capsule Take 1 capsule (40 mg total) by mouth daily. 90 capsule 0   sucralfate  (CARAFATE ) 1 g tablet Take 1 tablet (1 g total) by mouth 4 (four) times daily -  with meals and at bedtime. 270 tablet 0   venlafaxine  XR (EFFEXOR -XR) 150 MG 24 hr capsule Take 1 capsule (150 mg total) by mouth daily with breakfast. Take along with 75 mg capsule for a total daily dose of 225 mg. 90 capsule 0   venlafaxine  XR  (EFFEXOR -XR) 75 MG 24 hr capsule Take 1 capsule (75 mg total) by mouth daily. Take along with 150 mg capsule for a total daily dose of 225 mg. 90 capsule 0   No current facility-administered medications for this visit.    Allergies  Allergen Reactions   Nsaids Other (See Comments)    Causes ulcers to bleed   Morphine  And Codeine  Itching    Family History  Problem Relation Age of Onset   Arthritis Mother    Cancer Mother        breast   Heart failure Mother    Hyperlipidemia Father    Hypertension Father    Stroke Maternal Grandmother     Social History   Socioeconomic History   Marital status: Widowed    Spouse name: Not on file   Number of children: 1   Years of education: Not on file   Highest education level: Associate degree: academic program  Occupational History   Not on file  Tobacco Use   Smoking status: Every Day    Current packs/day: 1.00    Average packs/day: 0.7 packs/day for 77.1 years (57.1 ttl pk-yrs)    Types: Cigarettes  Start date: 1989   Smokeless tobacco: Never  Vaping Use   Vaping status: Some Days   Substances: Nicotine , CBD, Flavoring  Substance and Sexual Activity   Alcohol use: No   Drug use: No   Sexual activity: Not Currently    Birth control/protection: None  Other Topics Concern   Not on file  Social History Narrative   Not on file   Social Drivers of Health   Tobacco Use: High Risk (07/11/2024)   Patient History    Smoking Tobacco Use: Every Day    Smokeless Tobacco Use: Never    Passive Exposure: Not on file  Financial Resource Strain: Not on file  Food Insecurity: Food Insecurity Present (08/06/2023)   Hunger Vital Sign    Worried About Running Out of Food in the Last Year: Sometimes true    Ran Out of Food in the Last Year: Sometimes true  Transportation Needs: No Transportation Needs (08/06/2023)   PRAPARE - Administrator, Civil Service (Medical): No    Lack of Transportation (Non-Medical): No   Physical Activity: Not on file  Stress: Not on file  Social Connections: Not on file  Intimate Partner Violence: Not At Risk (08/06/2023)   Humiliation, Afraid, Rape, and Kick questionnaire    Fear of Current or Ex-Partner: No    Emotionally Abused: No    Physically Abused: No    Sexually Abused: No  Depression (PHQ2-9): Low Risk (07/11/2024)   Depression (PHQ2-9)    PHQ-2 Score: 0  Recent Concern: Depression (PHQ2-9) - High Risk (06/06/2024)   Depression (PHQ2-9)    PHQ-2 Score: 22  Alcohol Screen: Not on file  Housing: Low Risk (08/06/2023)   Housing    Last Housing Risk Score: 0  Utilities: Not At Risk (08/06/2023)   AHC Utilities    Threatened with loss of utilities: No  Health Literacy: Not on file     Constitutional: Patient reports headache.  Denies fever, malaise, fatigue, or abrupt weight changes.  HEENT: Pt reports swollen glands. Denies eye pain, eye redness, ear pain, ringing in the ears, wax buildup, runny nose, nasal congestion, bloody nose, or sore throat. Respiratory: Pt reports cough. Denies difficulty breathing, shortness of breath, or sputum production.   Cardiovascular: Denies chest pain, chest tightness, palpitations or swelling in the hands or feet.  Gastrointestinal: Pt reports intermittent reflux. Denies abdominal pain, bloating, constipation, diarrhea or blood in the stool.  GU: Denies urgency, frequency, pain with urination, burning sensation, blood in urine, odor or discharge. Musculoskeletal: Patient reports chronic joint and muscle pain.  Denies decrease in range of motion, difficulty with gait, or joint swelling.  Skin: Denies redness, rashes, lesions or ulcercations.  Neurological: Patient reports insomnia.  Denies dizziness, difficulty with memory, difficulty with speech or problems with balance and coordination.  Psych: Patient has a history of anxiety and depression.  Denies  SI/HI.  No other specific complaints in a complete review of systems  (except as listed in HPI above).      Objective:   Physical Exam  LMP 11/28/2021   Wt Readings from Last 3 Encounters:  07/11/24 190 lb 12.8 oz (86.5 kg)  06/06/24 189 lb (85.7 kg)  02/14/24 203 lb 3.2 oz (92.2 kg)    General: Appears older than her stated age, obese, in NAD. Skin: Warm, dry and intact.  HEENT: Head: normal shape and size; Eyes: sclera white, no icterus, conjunctiva pink, PERRLA and EOMs intact;  Cardiovascular: Normal rate and rhythm. S1,S2  noted.  No murmur, rubs or gallops noted. No JVD or BLE edema. No carotid bruits noted. Pulmonary/Chest: Normal effort and positive vesicular breath sounds. No respiratory distress. No wheezes, rales or ronchi noted.  Abdomen: Soft and nontender. Normal bowel sounds.  Musculoskeletal: No difficulty with gait.  Neurological: Alert and oriented. Coordination normal.  Psychiatric: Mood and affect normal. Mildly anxious appearing. Judgment and thought content normal.    BMET    Component Value Date/Time   NA 137 01/30/2024 1425   NA 142 12/05/2022 1138   NA 138 09/06/2014 0132   K 3.4 (L) 01/30/2024 1425   K 3.8 09/06/2014 0132   CL 101 01/30/2024 1425   CL 105 09/06/2014 0132   CO2 26 01/30/2024 1425   CO2 27 09/06/2014 0132   GLUCOSE 110 (H) 01/30/2024 1425   GLUCOSE 105 (H) 09/06/2014 0132   BUN 15 01/30/2024 1425   BUN 8 12/05/2022 1138   BUN 8 09/06/2014 0132   CREATININE 0.77 01/30/2024 1425   CREATININE 0.88 11/06/2023 1040   CALCIUM  10.4 (H) 01/30/2024 1425   CALCIUM  9.4 09/06/2014 0132   GFRNONAA >60 01/30/2024 1425   GFRNONAA >60 09/06/2014 0132   GFRNONAA >60 01/21/2014 0531   GFRAA 77 02/19/2020 1452   GFRAA >60 09/06/2014 0132   GFRAA >60 01/21/2014 0531    Lipid Panel     Component Value Date/Time   CHOL 146 11/06/2023 1040   CHOL 120 12/05/2022 1138   CHOL 268 (H) 01/19/2014 1427   TRIG 82 11/06/2023 1040   TRIG 340 (H) 01/19/2014 1427   HDL 46 (L) 11/06/2023 1040   HDL 52 12/05/2022 1138    HDL 33 (L) 01/19/2014 1427   CHOLHDL 3.2 11/06/2023 1040   VLDL UNABLE TO CALCULATE IF TRIGLYCERIDE OVER 400 mg/dL 92/87/7981 9281   VLDL 68 (H) 01/19/2014 1427   LDLCALC 83 11/06/2023 1040   LDLCALC 167 (H) 01/19/2014 1427    CBC    Component Value Date/Time   WBC 11.0 (H) 01/30/2024 1425   RBC 5.88 (H) 01/30/2024 1425   HGB 17.6 (H) 01/30/2024 1425   HGB 15.5 12/05/2022 1138   HCT 51.3 (H) 01/30/2024 1425   HCT 46.4 12/05/2022 1138   PLT 388 01/30/2024 1425   PLT 346 12/05/2022 1138   MCV 87.2 01/30/2024 1425   MCV 87 12/05/2022 1138   MCV 86 09/06/2014 0132   MCH 29.9 01/30/2024 1425   MCHC 34.3 01/30/2024 1425   RDW 13.0 01/30/2024 1425   RDW 13.1 12/05/2022 1138   RDW 15.1 (H) 09/06/2014 0132   LYMPHSABS 0.9 12/05/2022 1138   LYMPHSABS 2.1 09/06/2014 0132   MONOABS 0.3 11/04/2022 0259   MONOABS 0.9 09/06/2014 0132   EOSABS 0.1 12/05/2022 1138   EOSABS 0.1 09/06/2014 0132   BASOSABS 0.0 12/05/2022 1138   BASOSABS 0.0 09/06/2014 0132    Hgb A1C Lab Results  Component Value Date   HGBA1C 5.9 (H) 11/06/2023            Assessment & Plan:   Schedule an appointment for your annual exam Angeline Laura, NP "
